# Patient Record
Sex: Female | Born: 1945 | Race: Black or African American | Hispanic: No | Smoking: Never smoker
Health system: Southern US, Community
[De-identification: ages and names within clinical notes are randomized; demographics above are authoritative.]

## PROBLEM LIST (undated history)

## (undated) DIAGNOSIS — M79605 Pain in left leg: Secondary | ICD-10-CM

## (undated) DIAGNOSIS — I429 Cardiomyopathy, unspecified: Secondary | ICD-10-CM

## (undated) DIAGNOSIS — I499 Cardiac arrhythmia, unspecified: Secondary | ICD-10-CM

## (undated) DIAGNOSIS — M1990 Unspecified osteoarthritis, unspecified site: Secondary | ICD-10-CM

## (undated) DIAGNOSIS — D649 Anemia, unspecified: Secondary | ICD-10-CM

## (undated) DIAGNOSIS — I509 Heart failure, unspecified: Secondary | ICD-10-CM

## (undated) DIAGNOSIS — J189 Pneumonia, unspecified organism: Secondary | ICD-10-CM

## (undated) DIAGNOSIS — G8929 Other chronic pain: Secondary | ICD-10-CM

## (undated) DIAGNOSIS — Z5189 Encounter for other specified aftercare: Secondary | ICD-10-CM

## (undated) DIAGNOSIS — K649 Unspecified hemorrhoids: Secondary | ICD-10-CM

## (undated) DIAGNOSIS — K219 Gastro-esophageal reflux disease without esophagitis: Secondary | ICD-10-CM

## (undated) DIAGNOSIS — I10 Essential (primary) hypertension: Secondary | ICD-10-CM

## (undated) DIAGNOSIS — G4730 Sleep apnea, unspecified: Secondary | ICD-10-CM

## (undated) DIAGNOSIS — C9000 Multiple myeloma not having achieved remission: Secondary | ICD-10-CM

## (undated) DIAGNOSIS — Z8719 Personal history of other diseases of the digestive system: Secondary | ICD-10-CM

## (undated) DIAGNOSIS — IMO0001 Reserved for inherently not codable concepts without codable children: Secondary | ICD-10-CM

## (undated) DIAGNOSIS — E663 Overweight: Secondary | ICD-10-CM

## (undated) DIAGNOSIS — M549 Dorsalgia, unspecified: Secondary | ICD-10-CM

## (undated) DIAGNOSIS — G629 Polyneuropathy, unspecified: Secondary | ICD-10-CM

## (undated) DIAGNOSIS — E041 Nontoxic single thyroid nodule: Secondary | ICD-10-CM

## (undated) HISTORY — PX: CARDIAC DEFIBRILLATOR PLACEMENT: SHX171

## (undated) HISTORY — PX: COLONOSCOPY: SHX174

## (undated) HISTORY — PX: OTHER SURGICAL HISTORY: SHX169

## (undated) HISTORY — DX: Overweight: E66.3

## (undated) HISTORY — DX: Essential (primary) hypertension: I10

## (undated) HISTORY — DX: Cardiomyopathy, unspecified: I42.9

---

## 1969-03-22 HISTORY — PX: TUBAL LIGATION: SHX77

## 1989-03-22 HISTORY — PX: APPENDECTOMY: SHX54

## 1989-03-22 HISTORY — PX: OTHER SURGICAL HISTORY: SHX169

## 1989-03-22 HISTORY — PX: ABDOMINAL HYSTERECTOMY: SHX81

## 1990-03-22 HISTORY — PX: CHOLECYSTECTOMY OPEN: SUR202

## 1998-01-16 ENCOUNTER — Ambulatory Visit (HOSPITAL_COMMUNITY): Admission: RE | Admit: 1998-01-16 | Discharge: 1998-01-16 | Payer: Self-pay | Admitting: Cardiology

## 1998-01-17 ENCOUNTER — Encounter: Payer: Self-pay | Admitting: Cardiology

## 1998-08-15 ENCOUNTER — Ambulatory Visit (HOSPITAL_COMMUNITY): Admission: RE | Admit: 1998-08-15 | Discharge: 1998-08-15 | Payer: Self-pay | Admitting: Surgery

## 1998-08-15 ENCOUNTER — Encounter: Payer: Self-pay | Admitting: Surgery

## 1999-07-09 ENCOUNTER — Encounter: Payer: Self-pay | Admitting: Cardiology

## 1999-07-09 ENCOUNTER — Ambulatory Visit (HOSPITAL_COMMUNITY): Admission: RE | Admit: 1999-07-09 | Discharge: 1999-07-09 | Payer: Self-pay | Admitting: Cardiology

## 1999-10-26 ENCOUNTER — Encounter: Payer: Self-pay | Admitting: Family Medicine

## 1999-10-26 ENCOUNTER — Ambulatory Visit (HOSPITAL_COMMUNITY): Admission: RE | Admit: 1999-10-26 | Discharge: 1999-10-26 | Payer: Self-pay | Admitting: Family Medicine

## 2000-12-04 ENCOUNTER — Encounter: Payer: Self-pay | Admitting: Internal Medicine

## 2000-12-04 ENCOUNTER — Emergency Department (HOSPITAL_COMMUNITY): Admission: EM | Admit: 2000-12-04 | Discharge: 2000-12-04 | Payer: Self-pay | Admitting: Internal Medicine

## 2001-08-17 ENCOUNTER — Ambulatory Visit (HOSPITAL_COMMUNITY): Admission: RE | Admit: 2001-08-17 | Discharge: 2001-08-17 | Payer: Self-pay | Admitting: Family Medicine

## 2001-08-17 ENCOUNTER — Encounter: Payer: Self-pay | Admitting: Family Medicine

## 2001-09-11 ENCOUNTER — Encounter: Payer: Self-pay | Admitting: Family Medicine

## 2001-09-11 ENCOUNTER — Ambulatory Visit (HOSPITAL_COMMUNITY): Admission: RE | Admit: 2001-09-11 | Discharge: 2001-09-11 | Payer: Self-pay | Admitting: Family Medicine

## 2001-09-28 ENCOUNTER — Ambulatory Visit (HOSPITAL_COMMUNITY): Admission: RE | Admit: 2001-09-28 | Discharge: 2001-09-28 | Payer: Self-pay | Admitting: Cardiovascular Disease

## 2001-12-22 ENCOUNTER — Observation Stay (HOSPITAL_COMMUNITY): Admission: RE | Admit: 2001-12-22 | Discharge: 2001-12-23 | Payer: Self-pay | Admitting: General Surgery

## 2002-01-09 ENCOUNTER — Encounter: Payer: Self-pay | Admitting: Family Medicine

## 2002-01-09 ENCOUNTER — Ambulatory Visit (HOSPITAL_COMMUNITY): Admission: RE | Admit: 2002-01-09 | Discharge: 2002-01-09 | Payer: Self-pay | Admitting: Family Medicine

## 2002-01-18 ENCOUNTER — Encounter: Payer: Self-pay | Admitting: Family Medicine

## 2002-01-18 ENCOUNTER — Ambulatory Visit (HOSPITAL_COMMUNITY): Admission: RE | Admit: 2002-01-18 | Discharge: 2002-01-18 | Payer: Self-pay | Admitting: Family Medicine

## 2002-01-22 ENCOUNTER — Inpatient Hospital Stay (HOSPITAL_COMMUNITY): Admission: EM | Admit: 2002-01-22 | Discharge: 2002-01-30 | Payer: Self-pay | Admitting: Emergency Medicine

## 2002-01-22 ENCOUNTER — Encounter: Payer: Self-pay | Admitting: Emergency Medicine

## 2002-01-24 ENCOUNTER — Encounter: Payer: Self-pay | Admitting: General Surgery

## 2002-01-25 ENCOUNTER — Encounter: Payer: Self-pay | Admitting: General Surgery

## 2002-01-30 ENCOUNTER — Encounter: Payer: Self-pay | Admitting: Family Medicine

## 2002-02-01 ENCOUNTER — Inpatient Hospital Stay (HOSPITAL_COMMUNITY): Admission: EM | Admit: 2002-02-01 | Discharge: 2002-02-09 | Payer: Self-pay | Admitting: Emergency Medicine

## 2002-02-01 ENCOUNTER — Encounter: Payer: Self-pay | Admitting: General Surgery

## 2002-02-01 ENCOUNTER — Encounter: Payer: Self-pay | Admitting: Emergency Medicine

## 2002-02-02 ENCOUNTER — Encounter: Payer: Self-pay | Admitting: General Surgery

## 2002-02-03 ENCOUNTER — Encounter: Payer: Self-pay | Admitting: General Surgery

## 2002-02-04 ENCOUNTER — Encounter: Payer: Self-pay | Admitting: General Surgery

## 2002-02-08 ENCOUNTER — Encounter: Payer: Self-pay | Admitting: Family Medicine

## 2002-04-05 ENCOUNTER — Encounter: Payer: Self-pay | Admitting: General Surgery

## 2002-04-05 ENCOUNTER — Ambulatory Visit (HOSPITAL_COMMUNITY): Admission: RE | Admit: 2002-04-05 | Discharge: 2002-04-05 | Payer: Self-pay | Admitting: General Surgery

## 2002-07-09 ENCOUNTER — Encounter (HOSPITAL_COMMUNITY): Admission: RE | Admit: 2002-07-09 | Discharge: 2002-08-08 | Payer: Self-pay | Admitting: Orthopedic Surgery

## 2003-04-11 ENCOUNTER — Ambulatory Visit (HOSPITAL_COMMUNITY): Admission: RE | Admit: 2003-04-11 | Discharge: 2003-04-11 | Payer: Self-pay | Admitting: General Surgery

## 2003-05-16 ENCOUNTER — Encounter (HOSPITAL_COMMUNITY): Admission: RE | Admit: 2003-05-16 | Discharge: 2003-05-17 | Payer: Self-pay | Admitting: *Deleted

## 2004-03-19 ENCOUNTER — Ambulatory Visit: Payer: Self-pay | Admitting: *Deleted

## 2004-08-20 ENCOUNTER — Ambulatory Visit (HOSPITAL_COMMUNITY): Admission: RE | Admit: 2004-08-20 | Discharge: 2004-08-20 | Payer: Self-pay | Admitting: Family Medicine

## 2004-12-25 ENCOUNTER — Ambulatory Visit: Payer: Self-pay | Admitting: *Deleted

## 2005-03-28 ENCOUNTER — Emergency Department (HOSPITAL_COMMUNITY): Admission: EM | Admit: 2005-03-28 | Discharge: 2005-03-28 | Payer: Self-pay | Admitting: Emergency Medicine

## 2005-03-31 ENCOUNTER — Ambulatory Visit (HOSPITAL_COMMUNITY): Admission: RE | Admit: 2005-03-31 | Discharge: 2005-03-31 | Payer: Self-pay | Admitting: *Deleted

## 2005-03-31 ENCOUNTER — Ambulatory Visit: Payer: Self-pay | Admitting: *Deleted

## 2005-04-12 ENCOUNTER — Ambulatory Visit: Payer: Self-pay | Admitting: *Deleted

## 2005-09-15 ENCOUNTER — Ambulatory Visit: Payer: Self-pay | Admitting: Internal Medicine

## 2005-10-06 ENCOUNTER — Inpatient Hospital Stay (HOSPITAL_COMMUNITY): Admission: RE | Admit: 2005-10-06 | Discharge: 2005-10-07 | Payer: Self-pay | Admitting: Internal Medicine

## 2005-10-06 ENCOUNTER — Ambulatory Visit: Payer: Self-pay | Admitting: Internal Medicine

## 2005-10-09 ENCOUNTER — Observation Stay (HOSPITAL_COMMUNITY): Admission: EM | Admit: 2005-10-09 | Discharge: 2005-10-10 | Payer: Self-pay | Admitting: Emergency Medicine

## 2005-10-09 ENCOUNTER — Ambulatory Visit: Payer: Self-pay | Admitting: Internal Medicine

## 2005-10-11 ENCOUNTER — Inpatient Hospital Stay (HOSPITAL_COMMUNITY): Admission: EM | Admit: 2005-10-11 | Discharge: 2005-10-19 | Payer: Self-pay | Admitting: Emergency Medicine

## 2005-10-11 ENCOUNTER — Ambulatory Visit: Payer: Self-pay | Admitting: Cardiology

## 2005-10-11 ENCOUNTER — Encounter: Payer: Self-pay | Admitting: Emergency Medicine

## 2005-10-11 ENCOUNTER — Encounter: Payer: Self-pay | Admitting: Internal Medicine

## 2005-10-14 ENCOUNTER — Encounter: Payer: Self-pay | Admitting: Cardiology

## 2005-10-27 ENCOUNTER — Encounter: Payer: Self-pay | Admitting: Cardiology

## 2005-10-27 ENCOUNTER — Ambulatory Visit: Payer: Self-pay

## 2005-10-27 ENCOUNTER — Ambulatory Visit: Payer: Self-pay | Admitting: Cardiology

## 2005-11-04 ENCOUNTER — Emergency Department (HOSPITAL_COMMUNITY): Admission: EM | Admit: 2005-11-04 | Discharge: 2005-11-04 | Payer: Self-pay | Admitting: Emergency Medicine

## 2005-11-11 ENCOUNTER — Ambulatory Visit: Payer: Self-pay | Admitting: Cardiology

## 2005-11-16 ENCOUNTER — Ambulatory Visit: Payer: Self-pay | Admitting: *Deleted

## 2005-11-17 ENCOUNTER — Emergency Department (HOSPITAL_COMMUNITY): Admission: EM | Admit: 2005-11-17 | Discharge: 2005-11-17 | Payer: Self-pay | Admitting: Emergency Medicine

## 2005-11-19 ENCOUNTER — Emergency Department (HOSPITAL_COMMUNITY): Admission: EM | Admit: 2005-11-19 | Discharge: 2005-11-19 | Payer: Self-pay | Admitting: Emergency Medicine

## 2006-01-12 ENCOUNTER — Ambulatory Visit: Payer: Self-pay | Admitting: Internal Medicine

## 2006-01-26 ENCOUNTER — Ambulatory Visit: Payer: Self-pay | Admitting: Cardiovascular Disease

## 2006-02-21 ENCOUNTER — Encounter: Payer: Self-pay | Admitting: Emergency Medicine

## 2006-02-21 ENCOUNTER — Inpatient Hospital Stay (HOSPITAL_COMMUNITY): Admission: AD | Admit: 2006-02-21 | Discharge: 2006-02-24 | Payer: Self-pay | Admitting: Cardiovascular Disease

## 2006-02-21 ENCOUNTER — Ambulatory Visit: Payer: Self-pay | Admitting: Internal Medicine

## 2006-04-15 ENCOUNTER — Ambulatory Visit: Payer: Self-pay | Admitting: Internal Medicine

## 2006-06-05 ENCOUNTER — Ambulatory Visit: Payer: Self-pay | Admitting: Cardiology

## 2006-06-05 ENCOUNTER — Inpatient Hospital Stay (HOSPITAL_COMMUNITY): Admission: AD | Admit: 2006-06-05 | Discharge: 2006-06-09 | Payer: Self-pay | Admitting: Cardiology

## 2006-06-07 ENCOUNTER — Encounter: Payer: Self-pay | Admitting: Internal Medicine

## 2006-06-07 ENCOUNTER — Encounter: Payer: Self-pay | Admitting: Cardiology

## 2006-06-23 ENCOUNTER — Ambulatory Visit: Payer: Self-pay | Admitting: Internal Medicine

## 2006-07-11 ENCOUNTER — Ambulatory Visit (HOSPITAL_COMMUNITY): Admission: RE | Admit: 2006-07-11 | Discharge: 2006-07-11 | Payer: Self-pay | Admitting: Family Medicine

## 2006-07-27 ENCOUNTER — Ambulatory Visit: Payer: Self-pay | Admitting: Internal Medicine

## 2006-08-23 ENCOUNTER — Encounter: Payer: Self-pay | Admitting: Internal Medicine

## 2006-08-23 ENCOUNTER — Emergency Department (HOSPITAL_COMMUNITY): Admission: EM | Admit: 2006-08-23 | Discharge: 2006-08-23 | Payer: Self-pay | Admitting: Emergency Medicine

## 2006-09-01 ENCOUNTER — Ambulatory Visit: Payer: Self-pay | Admitting: Internal Medicine

## 2006-09-24 ENCOUNTER — Emergency Department (HOSPITAL_COMMUNITY): Admission: EM | Admit: 2006-09-24 | Discharge: 2006-09-24 | Payer: Self-pay | Admitting: Emergency Medicine

## 2006-12-08 ENCOUNTER — Ambulatory Visit: Payer: Self-pay | Admitting: Internal Medicine

## 2007-04-08 ENCOUNTER — Emergency Department (HOSPITAL_COMMUNITY): Admission: EM | Admit: 2007-04-08 | Discharge: 2007-04-08 | Payer: Self-pay | Admitting: Emergency Medicine

## 2007-04-10 ENCOUNTER — Emergency Department (HOSPITAL_COMMUNITY): Admission: EM | Admit: 2007-04-10 | Discharge: 2007-04-10 | Payer: Self-pay | Admitting: Emergency Medicine

## 2007-05-05 ENCOUNTER — Ambulatory Visit (HOSPITAL_COMMUNITY): Admission: RE | Admit: 2007-05-05 | Discharge: 2007-05-05 | Payer: Self-pay | Admitting: Internal Medicine

## 2007-05-09 ENCOUNTER — Ambulatory Visit (HOSPITAL_COMMUNITY): Admission: RE | Admit: 2007-05-09 | Discharge: 2007-05-09 | Payer: Self-pay | Admitting: Family Medicine

## 2007-05-26 ENCOUNTER — Ambulatory Visit: Payer: Self-pay | Admitting: Internal Medicine

## 2007-08-29 ENCOUNTER — Other Ambulatory Visit: Admission: RE | Admit: 2007-08-29 | Discharge: 2007-08-29 | Payer: Self-pay | Admitting: Family Medicine

## 2007-11-14 ENCOUNTER — Ambulatory Visit (HOSPITAL_COMMUNITY): Admission: RE | Admit: 2007-11-14 | Discharge: 2007-11-14 | Payer: Self-pay | Admitting: Family Medicine

## 2007-12-03 ENCOUNTER — Ambulatory Visit: Payer: Self-pay | Admitting: Cardiology

## 2007-12-04 ENCOUNTER — Encounter (INDEPENDENT_AMBULATORY_CARE_PROVIDER_SITE_OTHER): Payer: Self-pay | Admitting: Emergency Medicine

## 2007-12-04 ENCOUNTER — Inpatient Hospital Stay (HOSPITAL_COMMUNITY): Admission: EM | Admit: 2007-12-04 | Discharge: 2007-12-05 | Payer: Self-pay | Admitting: Emergency Medicine

## 2007-12-20 ENCOUNTER — Ambulatory Visit: Payer: Self-pay | Admitting: Internal Medicine

## 2008-03-06 ENCOUNTER — Emergency Department (HOSPITAL_COMMUNITY): Admission: EM | Admit: 2008-03-06 | Discharge: 2008-03-06 | Payer: Self-pay | Admitting: Emergency Medicine

## 2008-09-04 ENCOUNTER — Encounter (INDEPENDENT_AMBULATORY_CARE_PROVIDER_SITE_OTHER): Payer: Self-pay | Admitting: *Deleted

## 2008-09-18 DIAGNOSIS — I5022 Chronic systolic (congestive) heart failure: Secondary | ICD-10-CM | POA: Insufficient documentation

## 2008-09-18 DIAGNOSIS — I10 Essential (primary) hypertension: Secondary | ICD-10-CM | POA: Insufficient documentation

## 2008-09-18 DIAGNOSIS — E119 Type 2 diabetes mellitus without complications: Secondary | ICD-10-CM | POA: Insufficient documentation

## 2008-09-18 DIAGNOSIS — I429 Cardiomyopathy, unspecified: Secondary | ICD-10-CM | POA: Insufficient documentation

## 2008-12-04 ENCOUNTER — Ambulatory Visit (HOSPITAL_COMMUNITY): Admission: RE | Admit: 2008-12-04 | Discharge: 2008-12-04 | Payer: Self-pay | Admitting: Family Medicine

## 2009-02-07 ENCOUNTER — Emergency Department (HOSPITAL_COMMUNITY): Admission: EM | Admit: 2009-02-07 | Discharge: 2009-02-07 | Payer: Self-pay | Admitting: Emergency Medicine

## 2009-08-11 ENCOUNTER — Ambulatory Visit: Payer: Self-pay | Admitting: Internal Medicine

## 2010-01-12 ENCOUNTER — Ambulatory Visit (HOSPITAL_COMMUNITY): Admission: RE | Admit: 2010-01-12 | Discharge: 2010-01-12 | Payer: Self-pay | Admitting: Family Medicine

## 2010-01-16 ENCOUNTER — Encounter (INDEPENDENT_AMBULATORY_CARE_PROVIDER_SITE_OTHER): Payer: Self-pay | Admitting: *Deleted

## 2010-01-28 ENCOUNTER — Encounter: Payer: Self-pay | Admitting: Gastroenterology

## 2010-02-03 ENCOUNTER — Encounter: Payer: Self-pay | Admitting: Gastroenterology

## 2010-02-11 ENCOUNTER — Encounter (INDEPENDENT_AMBULATORY_CARE_PROVIDER_SITE_OTHER): Payer: Self-pay

## 2010-02-11 ENCOUNTER — Telehealth (INDEPENDENT_AMBULATORY_CARE_PROVIDER_SITE_OTHER): Payer: Self-pay

## 2010-02-17 ENCOUNTER — Telehealth (INDEPENDENT_AMBULATORY_CARE_PROVIDER_SITE_OTHER): Payer: Self-pay

## 2010-02-20 ENCOUNTER — Ambulatory Visit (HOSPITAL_COMMUNITY)
Admission: RE | Admit: 2010-02-20 | Discharge: 2010-02-20 | Payer: Self-pay | Source: Home / Self Care | Admitting: Gastroenterology

## 2010-02-27 ENCOUNTER — Encounter (INDEPENDENT_AMBULATORY_CARE_PROVIDER_SITE_OTHER): Payer: Self-pay | Admitting: *Deleted

## 2010-03-24 ENCOUNTER — Encounter (HOSPITAL_COMMUNITY)
Admission: RE | Admit: 2010-03-24 | Discharge: 2010-04-21 | Payer: Self-pay | Source: Home / Self Care | Attending: Podiatry | Admitting: Podiatry

## 2010-04-12 ENCOUNTER — Encounter: Payer: Self-pay | Admitting: Internal Medicine

## 2010-04-12 ENCOUNTER — Encounter: Payer: Self-pay | Admitting: Family Medicine

## 2010-04-23 ENCOUNTER — Ambulatory Visit: Admit: 2010-04-23 | Payer: Self-pay | Admitting: Internal Medicine

## 2010-04-23 ENCOUNTER — Encounter: Payer: Self-pay | Admitting: Internal Medicine

## 2010-04-23 ENCOUNTER — Encounter (INDEPENDENT_AMBULATORY_CARE_PROVIDER_SITE_OTHER): Payer: Medicare Other | Admitting: Internal Medicine

## 2010-04-23 DIAGNOSIS — I10 Essential (primary) hypertension: Secondary | ICD-10-CM

## 2010-04-23 DIAGNOSIS — R002 Palpitations: Secondary | ICD-10-CM

## 2010-04-23 DIAGNOSIS — I428 Other cardiomyopathies: Secondary | ICD-10-CM

## 2010-04-23 NOTE — Letter (Signed)
Summary: TCS ORDER/TRIAGE  TCS ORDER/TRIAGE   Imported By: Hoy Morn 01/28/2010 10:23:45  _____________________________________________________________________  External Attachment:    Type:   Image     Comment:   External Document

## 2010-04-23 NOTE — Miscellaneous (Signed)
Summary: dx correction  Clinical Lists Changes  Problems: Changed problem from PACEMAKER (ICD-V45.[name]01) to PACEMAKER, PERMANENT (ICD-V45.01) changed the incorrect dx code to correct dx code [name]  January 16, 2010 1:38 PM

## 2010-04-23 NOTE — Letter (Signed)
Summary: INSTRUCTIONS FOR TCS  INSTRUCTIONS FOR TCS   Imported By: Hoy Morn 02/03/2010 15:59:26  _____________________________________________________________________  External Attachment:    Type:   Image     Comment:   External Document

## 2010-04-23 NOTE — Letter (Signed)
Summary: Appointment - Reminder 2  [facility]. [address]   [place], [place] [address]   Phone: [phone]  Fax: [phone]     February 27, 2010 MRN: [id number]   TYREESE KNIEP [place], [place]  [address]   Dear Ms. Petrosyan,  Our records indicate that it is time to schedule a follow-up appointment.  Dr.  [name]        recommended that you follow up with [place] in   11.2011 PAST DUE         . It is very important that we reach you to schedule this appointment. We look forward to participating in your health care needs. Please contact us at the number listed above at your earliest convenience to schedule your appointment.  If you are unable to make an appointment at this time, give [place] a call so we can update our records.     Sincerely,   Public relations account executive

## 2010-04-23 NOTE — Progress Notes (Signed)
Summary: phone note/ need to update meds  Phone Note Outgoing Call   Call placed by: DORIS Call placed to: Patient Summary of Call: I called pt to review meds, etc before procedure. Number [phone] said not taking calls. [phone]  many rings and now answer. Will mail letter to call also, to review meds prior to procedure. Initial call taken by: Waldon Merl LPN,  November 23, 624THL 2:27 PM

## 2010-04-23 NOTE — Progress Notes (Signed)
Summary: phone note/ updated meds, etc  Phone Note Call from Patient   Caller: Patient Summary of Call: Pt called for me to review all of her meds. All updated. No changes. 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 I told her i will let Dr. Oneida Alar know, and if there is a problem i will call her back. Otherwise, plan on her procedure as scheduled.  Initial call taken by: Waldon Merl LPN,  November 29, 624THL 11:01 AM     Appended Document: phone note/ updated meds, etc ok.

## 2010-04-23 NOTE — Letter (Signed)
Summary: Plan of Care, Need to [facility]  [address]   [place], [place] [address]   Phone: [phone]  Fax: [phone]    February 11, 2010  [name] [address] [place], [place]  [address] 1946/01/27   Dear Ms. Morrill,  I have tried unsuccessfully to reach you by phone. Please call the office and ask to speak with me. I need to review your medications, etc prior to your procedure on 02/20/2010.  Please do not neglect your health.   Sincerely,    Waldon Merl LPN  [facility] Ph: [phone]    Fax: [phone]

## 2010-04-23 NOTE — Assessment & Plan Note (Signed)
Summary: F2Y   Visit Type:  Follow-up Primary Provider:  Dr.Gerald Hill  CC:  no cardiology complaints today.  History of Present Illness: Ms. Sassone returns today for followup of CHF and HTN.  She is a pleasant middle aged woman with a h/o DCM and HTN and morbid obesity.  She had recurrent ICD lead dislodgements and ultimately underwent removal of her ICD.  He EF had improved and her CHF symptoms have remained class 2.  He denies c/p, sob or peripheral edema except that she does get sob when she walks her dog.  No syncope or near syncope.  Current Medications (verified): 1)  Carvedilol 25 Mg Tabs (Carvedilol) .... Take 1 Tab Two Times A Day 2)  Aspir-Low 81 Mg Tbec (Aspirin) .... Take 1 Tab Daily 3)  Vitamin D3 1000 Unit Tabs (Cholecalciferol) .... Take 1 Tab Daily 4)  Metformin Hcl 1000 Mg Tabs (Metformin Hcl) .... Take 1 Tab Two Times A Day 5)  Aldactone 25 Mg Tabs (Spironolactone) .... Take 1 Tab Daily 6)  Glipizide 5 Mg Tabs (Glipizide) .... Take 1 Tab Daily 7)  Furosemide 40 Mg Tabs (Furosemide) .... Take 1 Tab Daily 8)  Diovan 160 Mg Tabs (Valsartan) .... Take 1 Tab Daily 9)  Lanoxin 0.125 Mg Tabs (Digoxin) .... Take 1 Tab Daily 10)  Daily Vitamins  Tabs (Multiple Vitamin) .... Take 1 Tab Daily  Allergies (verified): No Known Drug Allergies  Past History:  Past Medical History: Last updated: 09/18/2008 PACEMAKER (ICD-V45.[name]01) SYSTOLIC HEART FAILURE, CHRONIC (ICD-428.22) DIABETES MELLITUS (ICD-250.00) HYPERTENSION, UNSPECIFIED (ICD-401.9) OVERWEIGHT/OBESITY (ICD-278.02) CARDIOMYOPATHY, SECONDARY (ICD-425.9)    Past Surgical History: Last updated: 09/18/2008 ICD - St Jude Atlas   Review of Systems  The patient denies chest pain, syncope, dyspnea on exertion, and peripheral edema.    Vital Signs:  Patient profile:   65 year old female Height:      65 inches Weight:      272 pounds BMI:     45.43 Pulse rate:   70 / minute BP sitting:   112 / 65  (right  arm)  Vitals Entered By: Doretha Sou, CNA (Aug 11, 2009 10:30 AM)  Physical Exam  General:  Morbidly obese, well developed, well nourished, in no acute distress.  HEENT: normal Neck: supple. No JVD. Carotids 2+ bilaterally no bruits Cor: RRR no rubs, gallops or murmur Lungs: CTA. Healed old ICD incisions. Ab: soft, nontender. nondistended. No HSM. Good bowel sounds Ext: warm. no cyanosis, clubbing or edema Neuro: alert and oriented. Grossly nonfocal. affect pleasant    Impression & Recommendations:  Problem # 1:  SYSTOLIC HEART FAILURE, CHRONIC (ICD-428.22) Her symptoms remain well controlled.  I have asked that she continue her current meds, maintain a low sodium diet and continue her daily exercise. Her updated medication list for this problem includes:    Carvedilol 25 Mg Tabs (Carvedilol) .[name]... Take 1 tab two times a day    Aspir-low 81 Mg Tbec (Aspirin) .[name]... Take 1 tab daily    Aldactone 25 Mg Tabs (Spironolactone) .[name]... Take 1 tab daily    Furosemide 40 Mg Tabs (Furosemide) .[name]... Take 1 tab daily    Diovan 160 Mg Tabs (Valsartan) .[name]... Take 1 tab daily    Lanoxin 0.125 Mg Tabs (Digoxin) .[name]... Take 1 tab daily  Her updated medication list for this problem includes:    Carvedilol 25 Mg Tabs (Carvedilol) .[name]... Take 1 tab two times a day    Aspir-low 81 Mg Tbec (Aspirin) .Marland KitchenMarland KitchenMarland KitchenMarland Kitchen  Take 1 tab daily    Aldactone 25 Mg Tabs (Spironolactone) .[name]... Take 1 tab daily    Furosemide 40 Mg Tabs (Furosemide) .[name]... Take 1 tab daily    Diovan 160 Mg Tabs (Valsartan) .[name]... Take 1 tab daily    Lanoxin 0.125 Mg Tabs (Digoxin) .[name]... Take 1 tab daily  Problem # 2:  HYPERTENSION, UNSPECIFIED (ICD-401.9) Her blood pressure has for the most part remained well controlled.  Continue a low sodium diet and regular exercise. Her updated medication list for this problem includes:    Carvedilol 25 Mg Tabs (Carvedilol) .[name]... Take 1 tab two times a day    Aspir-low 81 Mg Tbec (Aspirin) .[name]... Take 1 tab  daily    Aldactone 25 Mg Tabs (Spironolactone) .[name]... Take 1 tab daily    Furosemide 40 Mg Tabs (Furosemide) .[name]... Take 1 tab daily    Diovan 160 Mg Tabs (Valsartan) .[name]... Take 1 tab daily  Problem # 3:  OVERWEIGHT/OBESITY (ICD-278.02) I have continue to encourage her on regular exercise and weight loss.  Patient Instructions: 1)  Your physician recommends that you schedule a follow-up appointment in: 6 months 2)  Your physician recommends that you continue on your current medications as directed. Please refer to the Current Medication list given to you today. Prescriptions: CARVEDILOL 25 MG TABS (CARVEDILOL) take 1 tab two times a day  #60 x 3   Entered by:   Jeani Hawking Via LPN   Authorized by:   Sophronia Simas, MD, [facility]   Signed by:   Jeani Hawking Via LPN on [id number]   Method used:   Electronically to        [facility]. [phone]* (retail)       [address]       [place], [place]  [address]       Ph: [phone] or [phone]       Fax: [phone]   RxIDTO:[id number]  Broadus John, pharmacist at [facility] was advised by phone that pt. can have 11 refills as pt. will not f/u for 1 year.

## 2010-04-29 NOTE — Assessment & Plan Note (Signed)
Summary: f18m/h   Vital Signs:  Patient profile:   65 year old female Weight:      272 pounds BMI:     45.43 Pulse rate:   71 / minute BP sitting:   109 / 61  (left arm)  Vitals Entered By: Doretha Sou, CNA (April 23, 2010 10:03 AM)  Primary Provider:  Dr.Gerald Hill   History of Present Illness: [name] returns today for followup of CHF and HTN.  She is a pleasant middle aged woman with a h/o DCM and HTN and morbid obesity.  She had recurrent ICD lead dislodgements and ultimately underwent removal of her ICD.  Her EF had improved and her CHF symptoms have remained class 2.  He denies c/p, sob or peripheral edema except that she does get sob when she walks her dog.  No syncope or near syncope.  She notes rare episodes of palpitations. No syncope or near syncope.  Current Medications (verified): 1)  Carvedilol 25 Mg Tabs (Carvedilol) .... Take 1 Tab Two Times A Day 2)  Aspir-Low 81 Mg Tbec (Aspirin) .... Take 1 Tab Daily 3)  Vitamin D3 1000 Unit Tabs (Cholecalciferol) .... Take 1 Tab Daily 4)  Metformin Hcl 1000 Mg Tabs (Metformin Hcl) .... Take 1 Tab Two Times A Day 5)  Aldactone 25 Mg Tabs (Spironolactone) .... Take 1 Tab Daily 6)  Glipizide 5 Mg Tabs (Glipizide) .... Take 1 Tab Daily 7)  Furosemide 40 Mg Tabs (Furosemide) .... Take 1 Tab Daily 8)  Diovan 160 Mg Tabs (Valsartan) .... Take 1 Tab Daily 9)  Lanoxin 0.125 Mg Tabs (Digoxin) .... Take 1 Tab Daily 10)  Ferrous Fumarate 325 (106 Fe) Mg Tabs (Ferrous Fumarate) .... Take 1 Tab Two Times A Day 11)  Fish Oil 1000 Mg Caps (Omega-3 Fatty Acids) .... Take 1 Tab Three Times A Day 12)  Metanx 3-35-2 Mg Tabs (L-Methylfolate-B6-B12) .... Take 1  Tab Daily  Allergies (verified): No Known Drug Allergies  Past History:  Past Medical History: Last updated: 09/18/2008 PACEMAKER (ICD-V45.[name]01) SYSTOLIC HEART FAILURE, CHRONIC (ICD-428.22) DIABETES MELLITUS (ICD-250.00) HYPERTENSION, UNSPECIFIED  (ICD-401.9) OVERWEIGHT/OBESITY (ICD-278.02) CARDIOMYOPATHY, SECONDARY (ICD-425.9)    Past Surgical History: Last updated: 09/18/2008 ICD - St Jude Atlas   Review of Systems  The patient denies chest pain, syncope, dyspnea on exertion, and peripheral edema.    Physical Exam  General:  Morbidly obese, well developed, well nourished, in no acute distress.  HEENT: normal Neck: supple. No JVD. Carotids 2+ bilaterally no bruits Cor: RRR no rubs, gallops or murmur Lungs: CTA. Healed old ICD incisions. Ab: soft, nontender. nondistended. No HSM. Good bowel sounds Ext: warm. no cyanosis, clubbing or edema Neuro: alert and oriented. Grossly nonfocal. affect pleasant    Impression & Recommendations:  Problem # 1:  SYSTOLIC HEART FAILURE, CHRONIC (ICD-428.22) Her symptoms are stable. No evidence that her EF is worsened. she will continue her current meds and maintain a low sodium diet. Her updated medication list for this problem includes:    Carvedilol 25 Mg Tabs (Carvedilol) .[name]... Take 1 tab two times a day    Aspir-low 81 Mg Tbec (Aspirin) .[name]... Take 1 tab daily    Aldactone 25 Mg Tabs (Spironolactone) .[name]... Take 1 tab daily    Furosemide 40 Mg Tabs (Furosemide) .[name]... Take 1 tab daily    Diovan 160 Mg Tabs (Valsartan) .[name]... Take 1 tab daily    Lanoxin 0.125 Mg Tabs (Digoxin) .[name]... Take 1 tab daily  Problem # 2:  OVERWEIGHT/OBESITY (ICD-278.02) I have encouraged her to continue her regular exercise and reduce her oral intake.  Problem # 3:  PALPITATIONS, OCCASIONAL (P7382067.1) Her palpitations are new. I suspect PAC's or PVC's. If they increase in frequency or severity or result in syncope, then she is instructed to call us. Her updated medication list for this problem includes:    Carvedilol 25 Mg Tabs (Carvedilol) .[name]... Take 1 tab two times a day    Aspir-low 81 Mg Tbec (Aspirin) .[name]... Take 1 tab daily  Patient Instructions: 1)  Your physician recommends that you schedule a  follow-up appointment in: 6 months 2)  Your physician recommends that you continue on your current medications as directed. Please refer to the Current Medication list given to you today.

## 2010-06-01 LAB — GLUCOSE, CAPILLARY: Glucose-Capillary: 124 mg/dL — ABNORMAL HIGH (ref 70–99)

## 2010-06-02 ENCOUNTER — Encounter: Payer: Self-pay | Admitting: Adult Health

## 2010-06-02 ENCOUNTER — Ambulatory Visit (INDEPENDENT_AMBULATORY_CARE_PROVIDER_SITE_OTHER): Payer: Medicare Other | Admitting: Adult Health

## 2010-06-02 DIAGNOSIS — K219 Gastro-esophageal reflux disease without esophagitis: Secondary | ICD-10-CM

## 2010-06-02 DIAGNOSIS — I5022 Chronic systolic (congestive) heart failure: Secondary | ICD-10-CM

## 2010-06-02 DIAGNOSIS — K3184 Gastroparesis: Secondary | ICD-10-CM | POA: Insufficient documentation

## 2010-06-02 DIAGNOSIS — R079 Chest pain, unspecified: Secondary | ICD-10-CM

## 2010-06-09 NOTE — Assessment & Plan Note (Signed)
Summary: pt having problems w/chest tightness/tg   Visit Type:  Follow-up Primary Provider:  Dr.Gerald Hill   History of Present Illness: [name] is a 65 y/o morbidly obese AAF we are following for continued assessment and treatment of Diastolic CHF, HTN.  She has had recurrent ICD lead dislodgements an ultimatley underwent removal of her ICD by Dr. [name] after lead dislodgement and microperforation leading to pericoardial tamponade and need for perocardiocentesis.  She comes today with complaints of burping, gas-like pain on the right side of chest, fullness in her stomach causing her not to be hungry.  She also has right sided scapular pain worse with movement, but is reproducible with palpation.  She states that it began 6 days ago after eating some nuts. She states that she usually does not eat nuts because they do not agree with her stomach, but had some in a piece of candy she ate 6 days ago.  The musculoskeletal pain appears to be improving but she still feels a lot of gas.  She had been on protonix in the past but she stopped taking it.  She has not been seen by a GI specialist.  Current Medications (verified): 1)  Carvedilol 25 Mg Tabs (Carvedilol) .... Take 1 Tab Two Times A Day 2)  Aspir-Low 81 Mg Tbec (Aspirin) .... Take 1 Tab Daily 3)  Vitamin D3 1000 Unit Tabs (Cholecalciferol) .... Take 1 Tab Daily 4)  Metformin Hcl 1000 Mg Tabs (Metformin Hcl) .... Take 1 Tab Two Times A Day 5)  Aldactone 25 Mg Tabs (Spironolactone) .... Take 1 Tab Daily 6)  Glipizide 5 Mg Tabs (Glipizide) .... Take 1 Tab Daily 7)  Furosemide 40 Mg Tabs (Furosemide) .... Take 1 Tab Daily 8)  Diovan 160 Mg Tabs (Valsartan) .... Take 1 Tab Daily 9)  Lanoxin 0.125 Mg Tabs (Digoxin) .... Take 1 Tab Daily 10)  Ferrous Fumarate 325 (106 Fe) Mg Tabs (Ferrous Fumarate) .... Take 1 Tab Two Times A Day 11)  Fish Oil 1000 Mg Caps (Omega-3 Fatty Acids) .... Take 1 Tab Three Times A Day 12)  Metanx 3-35-2 Mg Tabs  (L-Methylfolate-B6-B12) .... Take 1  Tab Daily 13)  Protonix 40 Mg Tbec (Pantoprazole Sodium) .... Take 1 Tablet By Mouth Once Daily  Allergies (verified): No Known Drug Allergies  Comments:  Nurse/Medical Assistant: patient states all meds are the same [facility] [place]  Past History:  Past medical, surgical, family and social histories (including risk factors) reviewed, and no changes noted (except as noted below).  Past Medical History: Reviewed history from 09/18/2008 and no changes required. PACEMAKER (ICD-V45.[name]01) SYSTOLIC HEART FAILURE, CHRONIC (ICD-428.22) DIABETES MELLITUS (ICD-250.00) HYPERTENSION, UNSPECIFIED (ICD-401.9) OVERWEIGHT/OBESITY (ICD-278.02) CARDIOMYOPATHY, SECONDARY (ICD-425.9)    Past Surgical History: Reviewed history from 09/18/2008 and no changes required. ICD - St Jude Atlas   Family History: Reviewed history and no changes required.  Social History: Reviewed history from 09/18/2008 and no changes required. Widowed  Tobacco Use - No.  Alcohol Use - no  Review of Systems       Denies DOE or edema  All other systems have been reviewed and are negative unless stated above.   Vital Signs:  Patient profile:   65 year old female Weight:      273 pounds BMI:     45.59 Pulse rate:   70 / minute BP sitting:   103 / 57  (left arm)  Vitals Entered By: Doretha Sou, CNA (June 02, 2010  11:41 AM)  Physical Exam  General:  Well developed, well nourished, in no acute distress.obese.   Neck:  Large soft goiter noted Lungs:  Clear bilaterally to auscultation and percussion. Heart:  Non-displaced PMI, chest non-tender; regular rate and rhythm, S1, S2 without murmurs, rubs or gallops. Carotid upstroke normal, no bruit. Normal abdominal aortic size, no bruits. Femorals normal pulses, no bruits. Pedals normal pulses. No edema, no varicosities.Distant heart sounds. Abdomen:  Obese, mild tenderness in the  RUQ. Scar noted  from prior cholecystectomy.  Normal bowel sounds. Msk:  Back normal, normal gait. Muscle strength and tone normal. Pulses:  pulses normal in all 4 extremities Extremities:  No clubbing or cyanosis. Neurologic:  Alert and oriented x 3. Psych:  Normal affect.   Impression & Recommendations:  Problem # 1:  GASTROPARESIS (ICD-536.3) Cannot rule this out.  Also may be GERD symptoms but she is also having gas pain.  I have prescribed protonix 40mg  daily.  She is to see primary care and be referred to GI specialist if he feels this would be helpful.  Problem # 2:  SYSTOLIC HEART FAILURE, CHRONIC (ICD-428.22) She does not appear to be fluid overloaded. There is no clinical evidence of edema, diminished lung sounds or wheezing.  She will continue current medications as directed.  She will follow Dr. [name] in 6 months as previously scheduled. Her updated medication list for this problem includes:    Carvedilol 25 Mg Tabs (Carvedilol) .[name]... Take 1 tab two times a day    Aspir-low 81 Mg Tbec (Aspirin) .[name]... Take 1 tab daily    Aldactone 25 Mg Tabs (Spironolactone) .[name]... Take 1 tab daily    Furosemide 40 Mg Tabs (Furosemide) .[name]... Take 1 tab daily    Diovan 160 Mg Tabs (Valsartan) .[name]... Take 1 tab daily    Lanoxin 0.125 Mg Tabs (Digoxin) .[name]... Take 1 tab daily  Patient Instructions: 1)  Your physician recommends that you schedule a follow-up appointment in: as previously planned 2)  Your physician has recommended you make the following change in your medication: start taking Protonix 40mg  by mouth once daily  Prescriptions: PROTONIX 40 MG TBEC (PANTOPRAZOLE SODIUM) take 1 tablet by mouth once daily  #30 x 6   Entered by:   Jeani Hawking Via LPN   Authorized by:   Jory Sims, NP   Signed by:   Jeani Hawking Via LPN on [id number]   Method used:   Electronically to        [facility]. [phone]* (retail)       [address]       [place], [place]  [address]       Ph: [phone]       Fax:  [phone]   RxID:   [id number]

## 2010-06-24 LAB — BASIC METABOLIC PANEL
BUN: 20 mg/dL (ref 6–23)
Calcium: 9.7 mg/dL (ref 8.4–10.5)
GFR calc non Af Amer: 51 mL/min — ABNORMAL LOW (ref >60)
Glucose, Bld: 136 mg/dL — ABNORMAL HIGH (ref 70–99)
Sodium: 138 mEq/L (ref 135–145)

## 2010-06-24 LAB — URINALYSIS, ROUTINE W REFLEX MICROSCOPIC
Bilirubin Urine: NEGATIVE
Ketones, ur: NEGATIVE mg/dL
Nitrite: NEGATIVE
Urobilinogen, UA: 0.2 mg/dL (ref 0.0–1.0)

## 2010-06-24 LAB — POCT CARDIAC MARKERS
CKMB, poc: 1.2 ng/mL (ref 1.0–8.0)
CKMB, poc: 1.4 ng/mL (ref 1.0–8.0)
Myoglobin, poc: 104 ng/mL (ref 12–200)
Troponin i, poc: <0.05 ng/mL (ref 0.00–0.09)

## 2010-06-24 LAB — DIFFERENTIAL
Basophils Absolute: 0 10*3/uL (ref 0.0–0.1)
Lymphocytes Relative: 23 % (ref 12–46)
Neutro Abs: 3.1 10*3/uL (ref 1.7–7.7)

## 2010-06-24 LAB — CBC
Platelets: 170 10*3/uL (ref 150–400)
RDW: 17.5 % — ABNORMAL HIGH (ref 11.5–15.5)

## 2010-08-04 NOTE — Assessment & Plan Note (Signed)
[name]                                 ON-CALL NOTE   [name], [name]                    MRN:          [id number]  DATE:09/24/2006                            DOB:          1945/10/06    DATE OF TELEPHONE CONVERSATION:  September 24, 2006, at 8:57   ATTENDING PHYSICIAN:  Dr. Dorris Carnes   I received a page through the answering service from [name]  complaining of chest discomfort.  She stated her blood pressure was up  and she felt very anxious.  She states she had taken her medications but  she had been outside in the heat some the previous day and was feeling  more fatigued than usual, and felt like her heart was skipping beats.  I  instructed her to come to the emergency room to get further evaluation  secondary to known history of heart failure and previous ICD.  Ms.  Waln stated that she would.      Rosanne Sack, ACNP  Electronically Signed      Fay Records, MD, [facility]  Electronically Signed   MB/MedQ  DD: 09/26/2006  DT: 09/26/2006  Job #: [id number]

## 2010-08-04 NOTE — Discharge Summary (Signed)
NAMEKELCEY, FREIBERG NO.:  [id number]   MEDICAL RECORD NO.:  [id number]          PATIENT TYPE:  INP   LOCATION:  [id number]                         FACILITY:  [facility]   PHYSICIAN:  Jacquelynn Cree, M.D.   DATE OF BIRTH:  1946-02-14   DATE OF ADMISSION:  12/03/2007  DATE OF DISCHARGE:  12/05/2062                               DISCHARGE SUMMARY   PRIMARY CARE PHYSICIAN:  Barrie Folk. Berdine Addison, MD   CARDIOLOGIST:  Champ Mungo. [name], MD   DISCHARGE DIAGNOSES:  1. Presyncope secondary to suspected orthostasis and dehydration in      the setting of chronic anemia.  2. Chronic systolic congestive heart failure, ejection fraction 30-      40%, compensated.  3. Diabetes mellitus.  4. Hypertension.  5. Stage II chronic take kidney disease.  6. Morbid obesity.  7. Microcytic anemia, fecal occult blood testing negative x1,      outpatient gastrointestinal evaluation recommended.  8. Cerebrovascular disease.  9. Hypoalbuminemia.   DISCHARGE MEDICATIONS:  1. Digoxin 250 mcg daily.  2. Coreg 25 mg b.i.d.  3. Glipizide ER 10 mg daily.  4. Lasix 40 mg daily.  5. Diovan 40 mg daily.  6. Iron 325 mg daily.  7. Multivitamin daily.  8. Aspirin 81 mg daily.  9. Metformin 1000 mg b.i.d.   CONSULTATIONS:  None.   BRIEF ADMISSION HISTORY OF PRESENT ILLNESS:  The patient is a 65-year-  old female who presented to the hospital with a chief complaint of near  syncope and dizziness.  She apparently felt lightheaded with position  changes.  She did not pass out or lose consciousness.  For the full  details, please see the dictated report done by Dr. Jacki Cones.   PROCEDURES AND DIAGNOSTIC STUDIES:  1. CT scan of the head on December 03, 2007, showed no acute      intracranial abnormalities.  Chronic small vessel ischemic disease      and brain atrophy was noted.  2. Chest x-ray on December 03, 2007, showed cardiomegaly and      pulmonary venous hypertension.  Mild left lateral basilar  scarring,      stable.  3. MRI/MRA of the brain on December 04, 2007, showed mild chronic      small vessel disease with no sign of acute or reversible pathology.      The patient did have some anterior circulation stenosis in the      medium-sized vessels.  These were notably affecting both proximal      and anterior cerebral arteries as well as the right middle cerebral      artery.  Stenosis did not appear to be severe, but could present      some increased potential for infarction in these vascular      territories.  4. Two-dimensional echocardiogram on December 04, 2007, showed      moderately decreased left ventricular systolic function with      ejection fraction estimated at 30-40%.  Study was inadequate for      the evaluation of left ventricular regional wall motion.  Left  ventricular wall thickness is mildly increased.  There is a      possible small pericardial effusion posterior to the heart.   DISCHARGE LABORATORY VALUES:  Sodium was 142, potassium 4.2, chloride  106, bicarb 28, BUN 18, creatinine 0.95, 9.7, hematocrit 30.1, and  platelets 162.   HOSPITAL COURSE BY PROBLEM:  1. Presyncope:  The patient's initial BUN-to-creatinine ratio was      elevated raising the suspicion of dehydration and orthostasis as a      possible explanation of her symptoms.  Specifically, her BUN was 24      and her creatinine 1.3.  With gentle fluid rehydration, her BUN-to-      creatinine ratio improved and her discharge BUN and creatinine are      noted above.  Her symptoms did not reoccur during the course of her      hospital stay.  24 hours after admission, she had no evidence of      ongoing orthostatic blood pressure drops.  Felt that her presyncope      was likely multifactorial reflecting orthostasis and dehydration in      the setting of chronic anemia.  The patient has been instructed      that if her symptoms return, she should call the primary care      physician or  return to the hospital for further evaluation.  2. Chronic systolic congestive heart failure.  The patient did have a      two-dimensional echocardiogram done here on the hospital, which is      not significantly changed from prior exams.  Her cardiac markers      were negative.  She did not have any decompensated heart failure      during her hospital stay.  3. Diabetes:  The patient's diabetes is well controlled with      hemoglobin A1c is 6.1% on her outpatient regimen.  4. Hypertension:  The patient's blood pressure is well controlled      throughout her hospital stay.  5. Stage II chronic kidney disease:  The patient's current creatinine      clearance is consistent with stage II chronic kidney disease.  She      should be monitored closely in the outpatient setting by her      primary care physician.  6. Morbid obesity:  The patient did seen by our dieticians here who      did extensive education with regard to weight management and      diabetes management.  She appeared motivated for ongoing weight      loss efforts.  7. Microcytic anemia:  The patient did have a hemoglobin of 9.3-10.2.      Iron was low at 32 with a total iron-binding capacity normal at      284.  Ferritin was 66.  Fecal occult blood testing was negative x1.      She is encouraged to take her iron supplement daily (the patient      indicates that she takes this only sporadically).  Her primary care      physician should consider an outpatient GI evaluation.  8. Cerebrovascular disease:  The patient does have some stenoses seen      on MRA.  These findings were discussed with the patient and the      patient was advised that it should be very important to control all      of her cerebrovascular risk factors including her hypertension,  diabetes, and obesity.  She was also instructed to take her aspirin      daily.  9. Hypoalbuminemia:  Again, the patient did have a dietician      evaluation with appropriate  dietary recommendations.   DISPOSITION:  The patient is medically stable for discharge.   DISPOSITION:  The emergency department for any nonresolution of  symptoms.      Jacquelynn Cree, M.D.  Electronically Signed     CR/MEDQ  D:  12/05/2007  T:  12/05/2007  Job:  [id number]   cc:   Barrie Folk. Berdine Addison, MD  Champ Mungo. [name], MD

## 2010-08-04 NOTE — H&P (Signed)
[name], [name] NO.:  [id number]   MEDICAL RECORD NO.:  [id number]          PATIENT TYPE:  EMS   LOCATION:  MAJO                         FACILITY:  [facility]   PHYSICIAN:  Sharlet Salina, M.D.   DATE OF BIRTH:  1945-07-10   DATE OF ADMISSION:  12/03/2007  DATE OF DISCHARGE:                              HISTORY & PHYSICAL   CHIEF COMPLAINT:  Is now almost passed out today with some  lightheadedness.   HISTORY OF PRESENT ILLNESS:  The patient is a 65 year old African  American female past medical history significant for multiple medical  problems including diabetes, hypertension, congestive heart failure.  The patient stated that today she was at home.  She stood up to get  something out of the cupboard.  When she stood up she felt very  lightheaded.  She did not get any vertigo and did not have any chest  pain or shortness of breath.  She sat back down then she got up to go to  the bathroom.  A half an hour later and she had the same feeling.  So,  she came to the emergency room.  She did not pass out, did not lose  consciousness.  She stated that this happened a few years ago but at  that time she was outside walking in the heat.  No other complaints  today.   PAST MEDICAL HISTORY:  Significant for:  1. Diabetes type 2.  2. Hypertension.  3. Chronic renal insufficiency.  4. Tricuspid regurg.  5. CHF with ejection fraction of 25-30%.  6. She is status post defibrillator placed in July 2007 that was      complicated by microperforation and pericardial effusion with      tamponade requiring pericardiocentesis.  7. Morbid obesity.  8. Total abdominal hysterectomy bilateral salpingo-oophorectomy.  9. Status post appendectomy.  10.Status post cholecystectomy.  11.Status post stomach stapling in the past.  12.Microcytic anemia.   FAMILY HISTORY:  Father's family history is unknown.  Mother is  deceased, she had a kidney disease.   SOCIAL HISTORY:  The  patient lives in [place].  She is widowed.  She  has 2 children.  No tobacco or alcohol use.   MEDICATIONS:  1. Digoxin 250 mcg daily.  2. Coreg 25 mg daily.  3. Glipizide ER 10 mg daily.  4. Lasix 40 mg daily.  5. Dilantin 80 mg half daily.  6. Multivitamin daily.  7. Iron 270 mg 2 daily.  8. Aspirin 81 mg daily.  9. Metformin 1000 mg twice daily.  10.Spironolactone 25 mg daily.   ALLERGIES:  No known drug allergies.   REVIEW OF SYSTEMS:  Negative otherwise stated in HPI.   PHYSICAL EXAM:  Temperature 98.8.  Blood pressure 101/60.  Pulse of 90.  Respirations 18.  Pulse ox 97% on room air.  HEENT:  Head normocephalic, atraumatic.  Pupils equal, round, reactive  to light but without erythema.  CARDIOVASCULAR:  Regular rate and rhythm  with S1, S2 systolic murmur.  LUNGS:  Clear bilaterally.  No wheeze, rhonchi or rales.  ABDOMEN:  Obese.  Soft, nontender, nondistended.  Positive bowel sounds.  EXTREMITIES:  Without edema.   LABS:  WBC 4.8, hemoglobin 10.2, MCV of 76.5, platelet 173.  Sodium 138,  potassium 4.34, chloride 106, BUN 24, creatinine 1.3, glucose 112,  hemoglobin 10.2, hematocrit 30.  Cardiac markers:  Myoglobin 86.3, MB  less than 1, troponin less than 0.05.  T waves 0-2.  WBCs:  Small  leukocyte esterase.  Head CT showed no acute intracranial abnormality,  chronic small vessel ischemic changes.  Chest x-ray shows cardiomegaly  with pulmonary venous hypertension, mild left basilar scarring.  EKG  without any acute changes.   ASSESSMENT/PLAN:  1. Near-syncopal episode.  2. Diabetes.  3. Hypertension.  4. Chronic renal insufficiency.  5. Congestive heart failure.  6. Morbid obesity.  7. Macrocytic anemia.  Will admit the patient to the hospital.  We      will check MRI, MRA, 2-D echo, check hemoglobin A1c, cardiac      markers.  We will monitor on telemetry tonight.  We will also check      hemoglobin A1c.  If everything turns out to be normal, then the       patient can be discharged to follow up with primary care physician.      Of note, creatinine is 1.3.  We will continue to monitor that.  If      it keeps increasing, she will need to be on other diabetic      medications apart from metformin with decreased creatinine      clearance.      Sharlet Salina, M.D.  Electronically Signed     NJ/MEDQ  D:  12/03/2007  T:  12/03/2007  Job:  [id number]

## 2010-08-04 NOTE — Assessment & Plan Note (Signed)
[facility]                         ELECTROPHYSIOLOGY OFFICE NOTE   [name], [name]                  MRN:          [id number]  DATE:05/26/2007                            DOB:          08/14/1945    [name] returns today for followup.  She is a very pleasant obese  middle-age woman with a nonischemic cardiomyopathy, who is status post  ICD insertion which was complicated initially by pericardial effusion  followed by lead dislodgement x2, resulting in multiple ICD discharges.  She ultimately had removal of her system.  She returns today for  followup.  It has been now over a year since her defibrillator was  removed.  She still occasionally has some recurrences and nightmares  about defibrillator going off but overall has been better.  She is  exercising on a regular basis.  Her weight is down on home scales to  270.  She denies chest pain .  Interestingly, her 2-D echocardiogram  following removal of the device demonstrated that her EF had gone up to  approximately 45-50%.  She had no specific complaints otherwise.   MEDICATIONS:  1. Digoxin 0.25 a day.  2. Metformin 1 gram twice daily.  3. Aldactone 25 a day.  4. Glipizide 10 a day.  5. Furosemide 40 a day.  6. Aspirin 81 a day.  7. Diovan 80 a day.   PHYSICAL EXAMINATION:  GENERAL:  She is a pleasant obese middle-age  woman in no acute distress.  VITAL SIGNS:  Blood pressure today was 126/73. The pulse 75 and regular.  Respirations were 18.  Weight was 276 pounds.  NECK:  Revealed no jugular distention.  LUNGS:  Clear bilaterally to auscultation.  No wheezes, rales or rhonchi  are present.  CARDIOVASCULAR:  Regular rate and rhythm.  Normal S1 and a split S2.  EXTREMITIES:  Demonstrated trace peripheral edema bilaterally.   IMPRESSION:  1. Nonischemic cardiomyopathy with improvement in left ventricular      function.  2. Status post implantable cardioverter-defibrillator  insertion with      multiple complications secondary to implant.  3. Morbid obesity.   DISCUSSION:  [name] is stable today.  She still have some  nightmares about defibrillator complications that we have had but  overall is improving gradually and remains  stable.  She is continuing to exercise and otherwise doing well.  I will  plan to see her back in 6 months.     Champ Mungo. [name], MD  Electronically Signed    GWT/MedQ  DD: 05/26/2007  DT: 05/28/2007  Job #: [id number]   cc:   Barrie Folk. Berdine Addison, MD

## 2010-08-04 NOTE — Assessment & Plan Note (Signed)
[facility]                         ELECTROPHYSIOLOGY OFFICE NOTE   Victoria, [name]                  MRN:          [id number]  DATE:12/20/2007                            DOB:          1945-05-07    [name] returns today for followup.  She is a very pleasant obese  middle-aged woman with a history of nonischemic cardiomyopathy,  congestive heart failure, morbid obesity, hypertension, and diabetes.  The patient returns today for followup.  She underwent ICD implantation  several years ago with ICD lead dislodging twice resulting in multiple  ICD shocks.  Ultimately, the device was removed.  Following this, the  patient underwent repeat echocardiography.  His heart failure symptoms  in fact improved and her LV function have been found to improve as well.  Now, her EF was approximately 45-50%.  The patient has been stable since  I last saw her.  She initially had very severe problems with sleep and  difficulty with flashbacks from ICD shocks, these are improving.  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  She recently had some problems with an  arch on her foot and is in boot today.   Her medicines include digoxin 0.25 a day, metformin 1 g twice daily,  Aldactone 25 a day, glipizide 10 a day, furosemide 40 a day, aspirin 81  a day and Diovan 80 mg half tablet daily.  She is also on Coreg 25 mg  twice daily.   PHYSICAL EXAMINATION:  GENERAL:  She is a pleasant well-appearing obese  woman, in no distress.  VITAL SIGNS:  Blood pressure was 113/70, the pulse 69 and regular, and  respirations were 18.  NECK:  No jugular distention.  LUNGS:  Clear bilaterally to auscultation.  No wheezes, rales, or  rhonchi are present.  There is no increased work of breathing.  CARDIAC:  Regular rate and rhythm.  Normal S1 and S2.  ABDOMINAL:  Obese, nontender, and nondistended.  EXTREMITIES:   No peripheral edema.   IMPRESSION:  1. Nonischemic cardiomyopathy.  2. Congestive heart failure presently class I-II.  3. Hypertension.  4. Diabetes.  5. Obesity.   DISCUSSION:  I have discussed the treatment with [name].  She is  presently stable.  I have encouraged her about the importance of  continued weight loss and regular exercise as she is able to.  I will  see her back in several months.     Champ Mungo. [name], MD  Electronically Signed   GWT/MedQ  DD: 12/20/2007  DT: 12/21/2007  Job #: [id number]

## 2010-08-04 NOTE — Assessment & Plan Note (Signed)
[facility]                         ELECTROPHYSIOLOGY OFFICE NOTE   LEGACY, KUZNIK                  MRN:          [id number]  DATE:09/01/2006                            DOB:          Nov 04, 1945    Ms. Ohman returns today for follow-up.  She is a very pleasant middle-  aged woman with a history of nonischemic cardiomyopathy status post ICD  insertion, status post multiple ICD shocks secondary to recurrent ICD  lead dislodgements, who returns today for follow-up.  The patient had  her defibrillator removed completely.  Her main complaint today is that  of fatigue and weakness, which she describes as being present for the  last several weeks.  Interestingly enough, most recent 2 D echo  demonstrated that her LV function had, in fact, improved.   PHYSICAL EXAMINATION:  GENERAL:  On physical examination today the  patient is a pleasant, morbidly obese middle-aged woman in no acute  distress.  VITAL SIGNS:  The blood pressure was 122/74, the pulse 60 and regular,  the respirations were 18.  The weight was 280 pounds.  NECK:  No jugular venous distention.  LUNGS:  Clear bilaterally to auscultation.  There were no wheezes, rales  or rhonchi.  CARDIOVASCULAR:  Regular rate and rhythm with normal S1, S2.  EXTREMITIES:  No clubbing, cyanosis, or edema.  Pulses were 2+ and  symmetric.   IMPRESSION:  1. Nonischemic cardiomyopathy.  2. Congestive heart failure class II with recent improvement in her      left ventricular function, approaching near normal by echo.  3. Fatigue and weakness and shortness of breath.   DISCUSSION:  The etiology of the patient's symptoms is unclear.  I am  concerned that perhaps now that her LV function is better, she does not  require as much of her heart failure medications and to this end, she  has had her Diovan decreased to 80 mg daily by Dr. Berdine Addison and I have also  recommended that she decrease her Coreg to 6 mg  twice daily.  With  regard to her diuretic, I would ask that she weigh herself daily and  stop her Lasix unless her weight goes over 182 pounds.  I will see her  back in several weeks.     Champ Mungo. [name], MD  Electronically Signed    GWT/MedQ  DD: 09/01/2006  DT: 09/02/2006  Job #: [id number]   cc:   Barrie Folk. Berdine Addison, MD

## 2010-08-04 NOTE — Assessment & Plan Note (Signed)
[facility]                         ELECTROPHYSIOLOGY OFFICE NOTE   [name], [name]                  MRN:          [id number]  DATE:12/08/2006                            DOB:          March 29, 1945    [name] returns today for followup.  She is a very pleasant,  morbidly obese woman with nonischemic cardiomyopathy who underwent ICD  implantation with two episodes of lead dislodgement resulting in  multiple ICD shocks, ultimately undergoing lead removal.  She returns  today for followup.  She continues to complain of some anxiety and  feelings of being afraid.  She denies chest pain, she denies shortness  of breath.  She continues to travel and otherwise has been stable.  She  has continued to try to lose weight and has lost down from over 330  pounds to 275.  She does have some frustration over her inability to  lose additional weight.   MEDICATIONS:  Include digoxin 250 mcg daily, Metformin 1000 mg twice  daily, Aldactone 25 mg daily, glipizide 10 mg daily, furosemide 40 mg  daily, aspirin 81 mg daily, Diovan 160 1/2 tablet daily, and Coreg 25 mg  1/2 tablet daily.   EXAMINATION:  She is a pleasant, obese middle-aged woman in no distress.  VITAL SIGNS:  Blood pressure 124/66, pulse 66 and regular, respirations  are 16, weight was 275 pounds.  NECK:  Revealed no jugular venous distension.  LUNGS:  Are clear bilaterally to auscultation, no wheezes, rales or  rhonchi are present.  CARDIOVASCULAR EXAM:  Revealed a regular rate and rhythm with normal S1,  S2.  EXTREMITIES:  Demonstrated trace peripheral edema bilaterally.   IMPRESSION:  1. Nonischemic cardiomyopathy.  2. Status post ICD insertion status post removal.  3. Morbid obesity.  4. Congestive heart failure with improvement of LV function post lead      removal.   DISCUSSION:  Overall [name] has been stable.  I have talked to her  about different options with regard to  treatment of her anxiety  regarding multiple ICD shocks.  I have offered her referral to a medical  doctor, Dr. Berdine Addison as well as for psychiatric evaluation if she would  like.  She refuses these at present but states that she does plan to go  back to see Dr. Berdine Addison.     Champ Mungo. [name], MD  Electronically Signed    GWT/MedQ  DD: 12/08/2006  DT: 12/08/2006  Job #: [id number]   cc:   Barrie Folk. Berdine Addison, MD

## 2010-08-07 NOTE — Op Note (Signed)
NAMEADALIZ, [name] NO.:  [id number]   MEDICAL RECORD NO.:  [id number]          PATIENT TYPE:  INP   LOCATION:  2912                         FACILITY:  [facility]   PHYSICIAN:  Deboraha Sprang, MD, FACCDATE OF BIRTH:  [birth year]   DATE OF PROCEDURE:  06/06/2006  DATE OF DISCHARGE:                               OPERATIVE REPORT   PREOPERATIVE DIAGNOSIS:  Defibrillator lead migration with inappropriate  shocks.   POSTOPERATIVE DIAGNOSIS:  Defibrillator lead migration with  inappropriate shocks.   PROCEDURE:  Explantation of a previously-implanted lead and removal of  the defibrillator generator.   Following the obtaining of informed consent, the patient was brought to  the lab and placed on the fluoroscopic table in supine position.  After  routine prep and drape of the left upper chest, lidocaine was  infiltrated along the line of the previous incision and carried down to  the layer of the device pocket using electrocautery and sharp  dissection.  The pocket was opened.  The device was explanted and  removed from the lead.  With care then, the lead was freed up from the  scar tissue to assess what might have been the problem.  As I had  feared, the lead moved with the secured collar.  The sutures around the  collar were tight but obviously not tight enough.  The lead screw was  retracted.  It could be seen to free up from something in the RV.  It  fell into the low RA and then it was withdrawn without complication.  Pressure was held.  The wound was then copiously irrigated with  antibiotic-containing saline solution.  The anterior and posterior  aspects of the pocket were closed with 2-0 Vicryl.  The wound was closed  in three layers in the normal fashion.  The wound was washed, dried, and  a benzoin and Steri-Strip dressing was applied.  Needle counts, sponge  counts and instrument counts were correct at the end of the procedure  according to the staff.  The  patient tolerated the procedure without  apparent complication.      Deboraha Sprang, MD, [facility]  Electronically Signed     SCK/MEDQ  D:  06/06/2006  T:  06/06/2006  Job:  [id number]

## 2010-08-07 NOTE — Assessment & Plan Note (Signed)
[place]                         ELECTROPHYSIOLOGY OFFICE NOTE   HEELA, FRYDMAN                  MRN:          [id number]  DATE:06/23/2006                            DOB:          May 28, 1945    Ms. Sturch returns today for followup.  She is a very pleasant, obese,  middle-aged woman with a nonischemic cardiomyopathy and congestive heart  failure who is status post ICD insertion who had her procedure  complicated initially by a pericardial effusion requiring pericardial  drainage and this was followed by ventricular lead dislodgments  resulting in multiple ICD shocks status post initial lead removal,  status post insertion of a new lead, status post recurrent lead  displacement resulting in recurrent multiple (greater than 50) ICD  shocks.  Since the last episode, which occurred several weeks ago, her  defibrillator lead has been removed and she returns today for followup.  She was initially very frightened and scared and apprehensive following  ICD removal but has continued to improve and do well.  She notes that  she has occasional memories of the event with her multiple ICD shocks.  However, overall she is better.  She has started back walking and does  nearly a mile per day.  She has had no dizziness or lightheadedness and  has otherwise been stable.   PHYSICAL EXAMINATION:  GENERAL:  She is a pleasant, obese, middle-aged  woman in no acute distress.  VITAL SIGNS:  The blood pressure today was 116/72, the pulse was 77 and  regular, the respirations were 18, the weight was 280 pounds.  NECK:  Revealed no jugular venous distention.  LUNGS:  Clear bilaterally to auscultation.  There were no wheezes,  rales, or rhonchi.  CARDIOVASCULAR:  Revealed a regular rate and rhythm with normal S1 and  S2.  There were no murmurs, rubs or gallops.  EXTREMITIES:  Demonstrated no cyanosis, clubbing or edema.   IMPRESSION:  1. Status post  implantable cardioverter-defibrillator insertion      secondary to nonischemic cardiomyopathy and congestive heart      failure.  2. Multiple implantable cardioverter-defibrillator discharges      secondary to recurrent lead dislodgments.   DISCUSSION:  Ms. Huss is stable and is doing better without any ICD  in place.  Because of her recurrent and repetitive repeat dislodgment of  her defibrillator lead, I have recommended that she not undergo  replacement of this at the present time.   We will plan on seeing her back on a regular followup for treatment of  her heart failure.     Champ Mungo. [name], MD  Electronically Signed    GWT/MedQ  DD: 06/23/2006  DT: 06/23/2006  Job #: [id number]

## 2010-08-07 NOTE — Discharge Summary (Signed)
[name], [name] NO.:  [id number]   MEDICAL RECORD NO.:  [id number]          PATIENT TYPE:  INP   LOCATION:  [id number]                         FACILITY:  [facility]   PHYSICIAN:  Blane Ohara, MD   DATE OF BIRTH:  07-19-1945   DATE OF ADMISSION:  10/11/2005  DATE OF DISCHARGE:                                 DISCHARGE SUMMARY   PROCEDURE:  1.  Urgent pericardiocentesis.  2.  Implantable cardiac defibrillator lead revision.  3.  2-D echocardiogram.  4.  Insertion of PICC line.  5.  Transfusion of one unit of packed cells.   PRIMARY DIAGNOSIS:  Pericardial tamponade.   SECONDARY DIAGNOSES:  1.  Implantable cardiac defibrillator lead dislodgement/micro perforation      with pericardial effusion resulting in poor pacing and sensing      thresholds.  2.  Congestive heart failure.  3.  Acute renal insufficiency with a peak BUN and creatinine of 38/1.5.  4.  Hypotension requiring dopamine.  5.  Iron deficiency anemia.  6.  Nonischemic cardiomyopathy with an ejection fraction of 25% to 30% by      echocardiogram this admission.  7.  Diabetes.  8.  Morbid obesity.  9.  Status post [place] plus fibrillator on [phone].  10. Severe tricuspid regurgitation.  11. Hypertension  12. Status post cholecystectomy and stomach stapling procedure as well as      removal of an ovarian cyst.  13. Chronic bronchitis.   ALLERGIES OR INTOLERANCES:  To medications are none.   TIME OF DISCHARGE:  42 minutes.   HOSPITAL COURSE:  [name] is a 65 year old female with a history of  nonischemia cardiomyopathy who had a St. Jude single chamber ICD implanted  on October 06, 2005.  She became dizzy while talking on the phone as well as  having nausea and chest pressure.  She was brought to the [facility]  emergency room where her systolic blood pressure was 72 mmHg.  She was fluid  resuscitated and was orthostatic.  She was transferred to [facility] urgently for  further evaluation and treatment.   She was felt to have a vasovagal event and her defibrillator has not fired.  She was noted to have a very small posterior perfusion and micro perforation  was a possibility, so a 2-D echocardiogram was obtained.  The 2-D  echocardiogram showed a small pericardial effusion that was 11 mm in maximal  dimension.  She continued to have orthostatic symptoms of dizziness and  hypotension, so her fluid resuscitation was continued and a chest CT with  contrast was obtained.  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  There were no focal findings in the pelvis.  Her blood  pressure medications were held and she was monitored closely.   On October 13, 2005, she developed more pain and her systolic blood pressure  dropped again.  She was diaphoretic and had a pulsus paradoxus of 8 to 10  mmHg.  She was in  sinus tachycardia.  She was transferred to ICU and fluid  resuscitation was continued.   Dr. [name] evaluated her and felt that she needed ICD lead revision as she  had decrease in values from when the ICD was implanted.  This was performed  on October 14, 2005.  Dr. [name] found that the patient's lead had dislodged  and was moving back and forth with a screw extended.  He felt that this  explained her pericardial effusion.  The lead was repositioned on the RV  septum.  The impedance and threshold as well as R-waves were all good.  Defibrillator threshold testing was not performed secondary to her being  hypotensive and on dopamine.   After the lead revision, she had a drop in her urine output and blood  pressure, and a repeat 2-D echocardiogram showed marked increase in her  pericardial effusion with mild right ventricular chamber collapse.  It was  felt that she needed pericardiocentesis and this was performed on October 14, 2005, and a drain was placed.  Her heart  rate decreased 120 to 90 and her  blood pressure increased [id number] to [id number] systolic.  Approximately 700 mL of fluid  was removed.   That night [name] had problems with pain and hypertension, but her  heart rate and blood pressure improved, and it was felt that this was  pericardial related pain.  She was monitored closely.   [name] was noted to be anemic on admission with a hemoglobin of 7.3  and a hematocrit of  32.8.  After the procedures her hemoglobin dropped to  8.6 with a hematocrit of 26.4.  She was found to be iron deficient and  advised [place] that she was taking iron at home prior to admission although this  was not on the med list.  She states that she had been evaluated by an  oncologist in [place] and was following up with him.  She was started back  on iron supplementation and is to continue this.  At discharge, her  hemoglobin was 9.0 with a hematocrit of 27.   [name] has problems with volume overload and fluid  resuscitation and her Lasix was restarted.  Her blood pressure remained low  normal and her Coreg and Diovan continued to be held, although they were  eventually started at decreased doses.  She had renal insufficiency during  her acute event with a peak BUN/creatinine 38/1.5.  This improved and her  BUN was 23 with a creatinine 1.0 at discharge.  As her condition improved,  she was restarted on her diabetes medications well.  She was put in for a  home health RN and aide to help her in recovery, but it was not felt that  inpatient rehab was indicated.   By October 19, 2005,  [name] was ambulating without difficulty and felt  that her respiratory status was at baseline.  Of note, her BNP was elevated  in 575, but she had no symptoms of overload and her oxygen saturation was  96% on room air.  An iron profile had been performed which showed an iron  level of 28, with a TIBC of 218, 13% saturation and UIBC 190.  A ferritin level was within  normal limits at 183.  She had received one unit of packed  cells during her admission.   On October 19, 2005, [name] was evaluated by Dr. Burt Knack.  She is to  follow up in our office  with a physician extender visit and an  echocardiogram at the time of her wound check.  She was discharged on a  reduced dose of Coreg and Diovan, which can be up titrated as an outpatient  at previous doses and she will tolerate.  [name] was considered stable  for discharge on October 19, 2005, with outpatient follow-up arranged.   DISCHARGE INSTRUCTIONS:  Her activity level is to be increased slowly.  She  is to stick to a low-fat diabetic diet.  She is to call our office for any  problems with the incision.  She is not to drive for a week and she is not  to not lift anything with that arm for six weeks.   DISCHARGE MEDICATIONS:  1.  Coreg 12.5 mg one half tab b.i.d.  2.  Diovan 160 mg one half tablet daily.  3.  Digoxin 0.25 mg daily.  4.  Prilosec 40 mg a day.  5.  Metformin 1000 mg b.i.d.  6.  Aldactone 25 mg a day.  7.  Glipizide 10 mg a day.  8.  Lasix 40 mg a day.  9.  Aspirin 81 mg a day.  10. Allegra p.r.n.   She has an appointment on August 8 at [facility] in [place] and  at that time is to get an echocardiogram at 8:30, to see Dr. Tanna Furry  physician extender at 9:15 together with her pacer check at 10 a.m. She will  follow up with Dr. [name] after that and with Dr. Wilhemina Cash in six months.  She  is to follow up with Dr. Alferd Patee as previously scheduled.      Rosaria Ferries, P.A. [facility]      Blane Ohara, MD  Electronically Signed    RB/MEDQ  D:  10/19/2005  T:  10/19/2005  Job:  [id number]

## 2010-08-07 NOTE — Op Note (Signed)
Victoria, [name] NO.:  [id number]   MEDICAL RECORD NO.:  [id number]          PATIENT TYPE:  INP   LOCATION:  2029                         FACILITY:  [facility]   PHYSICIAN:  Deboraha Sprang, MD, FACCDATE OF BIRTH:  07-Aug-1945   DATE OF PROCEDURE:  02/22/2006  DATE OF DISCHARGE:                               OPERATIVE REPORT   PREOPERATIVE DIAGNOSIS:  Previously implanted defibrillator with  inappropriate shocks, related to posterior dislocation of the RV  defibrillator lead.   POSTOPERATIVE DIAGNOSIS:  Previously implanted defibrillator with  inappropriate shocks, related to posterior dislocation of the RV  defibrillator lead.   PROCEDURES:  1. Contrast sonography explantation of the previously implanted lead.  2. Implantation of a new lead with intraoperative defibrillation      threshold testing.   DESCRIPTION OF PROCEDURE:  Following, we obtained informed consent, the  patient was brought to the electrophysiology laboratory on the  fluoroscopic stable in supine position.  Contrast sonogram was obtained,  demonstrating the patency of the extrathoracic, left subclavian vein.  Lidocaine was infiltrated over the previous incision after routine prep  and drape and carried down to the layer of the device pocket using sharp  dissection and electrocautery.  The pocket was open.  It was full of a  clear serous fluid.  It may have been some lidocaine infiltrated.  The  device was explanted and then lead was freed up from the underlying scar  tissue.  All retained sutures were removed.  At this point, prior to  explantation of the previous lead.  I got venous access and placed a  guidewire.  The helix of the implanted lead was retracted; it took about  50 turns.  The lead was then removed with counter-clockwise rotation and  was removed without difficulty.  At this point, a 9-French sheath was  placed over the retained guidewire and through this was passed a  [employer] [id number] 65-cm dual coil active fixation  defibrillator lead, serial [id number] and under fluoroscopic guidance  was manipulated in the right ventricular apex with a bipolar R-wave of  11 with the pace impedance of 881, a threshold of 0.9 volts, 0.5  milliseconds, current threshold to 1.4 mA.  There was no diaphragmatic  pacing at 10 volts.  This lead was secured to the pre-pectoral fashion  and then attached back to the [place] serial [id number].  Through the device, the bipolar R-wave was 9.3 with a pacing impedence  of 690, threshold to 0.75 and 0.5.  The pocket floor was then excised.  The pocket was copiously irrigated with antibiotic-containing saline  solution.  Hemostasis was assured and then the lead and the pulse  generator placed in the pocket.  Ventricular fibrillation was then  induced via the T wave shock.  After a total duration of 5 seconds, a 15  joule shock was delivered through of measured resistance 32 ohms,  terminated ventricular rate fibrillation, restoring sinus rhythm.  I  should note that the pre-induction high-voltage threshold was  36 ohms.  The wound was then closed in three layers in  formal fashion.  The wound was washed, dried, and benzoin Steri-Strips applied.  Needle  counts, sponge counts, and instruments were correct at the end of the  procedure, according to the staff.  The patient tolerated the procedure  without apparent complication.      Deboraha Sprang, MD, [facility]  Electronically Signed     SCK/MEDQ  D:  02/22/2006  T:  02/23/2006  Job:  [id number]   cc:   Electrophysiology Laboratory  [place]:  [facility]

## 2010-08-07 NOTE — H&P (Signed)
NAMESATURNINA, ZIEMBA NO.:  [id number]   MEDICAL RECORD NO.:  [id number]          PATIENT TYPE:  INP   LOCATION:  2912                         FACILITY:  [facility]   PHYSICIAN:  Satira Sark, MD DATE OF BIRTH:  1945-12-16   DATE OF ADMISSION:  06/05/2006  DATE OF DISCHARGE:                              HISTORY & PHYSICAL   PRIMARY CARDIOLOGIST:  Champ Mungo. [name], M.D.   REASON FOR ADMISSION:  Multiple defibrillator discharges.   HISTORY OF PRESENT ILLNESS:  Ms. Hemeon is a pleasant 65 year old  woman with history of a nonischemic cardiomyopathy with previously  documented ejection fraction in the 25-30% range, status post  defibrillator placement in July of 2007.  This was complicated by lead  dislodgement and microperforation, ultimately with pericardial tamponade  requiring pericardiocentesis.  She was admitted to the hospital in  December of 2007 with multiple defibrillator discharges due to lead  displacement and underwent explantation of the displaced right  ventricular lead with implantation of a Medtronic Sprint Quattro lead.  The patient has a Training and development officer defibrillator that was left in place.  She states that she has done fairly well.  She has had no recent  problems with limiting dyspnea, chest pain, or palpitations.  She states  that for the last few days, she has felt that her defibrillator box was  just not quite right.  She seems to relate that it felt a bit more  loose than usual and then this morning after church she suddenly began  to develop defibrillator shocks that occurred multiple times, reported  by EMS to be as many as 35.  Available tracings indicate that the  patient was in sinus rhythm during her shocks, suggesting more than  likely another lead problem.  As yet, there is no chest x-ray to assess  lead placement and labs are pending.  A magnet has been placed on the  device and the patient is comfortable at this time, remaining  in normal  sinus rhythm.  She reports no other major interval change.   ALLERGIES:  No known drug allergies.   CURRENT MEDICATIONS:  1. Digoxin 0.25 mg p.o. daily.  2. Coreg 12.5 mg p.o. b.i.d.  3. Metformin 1000 mg p.o. b.i.d.  4. Spironolactone 25 mg p.o. daily.  5. Glipizide 10 mg p.o. daily.  6. Lasix 40 mg p.o. daily.  7. Aspirin 81 mg p.o. daily.  8. Iron supplements.  9. Multivitamin one p.o. daily.  10.Diovan 80 mg p.o. daily.  11.Allegra p.r.n.   PAST MEDICAL HISTORY:  As outlined above.  Additional problems include  type 2 diabetes mellitus, hypertension, renal insufficiency, tricuspid  regurgitation described as severe based on previous office notes.   SOCIAL HISTORY:  The patient lives in [place].  She denies any  alcohol or tobacco use.   FAMILY HISTORY:  Reviewed and is noncontributory at this point.   REVIEW OF SYSTEMS:  As described in history of present illness.  The  patient has been in her usual state of health otherwise.   PHYSICAL EXAMINATION:  VITAL SIGNS:  Blood pressure 150/90, heart rate  95 and sinus rhythm.  Oxygen saturation is 100% on face mask,  respirations 24.  GENERAL:  This is a morbidly obese woman, anxious, but in no acute  distress.  HEENT:  Conjunctivae and lids normal, oropharynx clear.  NECK:  Supple without elevated jugular venous pressure or loud bruits.  No thyromegaly is noted.  LUNGS:  Clear without labored breathing.  HEART:  Regular rate and rhythm without loud murmur or gallop.  Pacemaker device incision well-healed and intact in the upper left  thorax.  ABDOMEN:  Soft, no obvious hepatomegaly, no bruits.  Bowel sounds  present.  EXTREMITIES:  1+ edema.  SKIN:  Warm and dry.  MUSCULOSKELETAL:  No kyphosis is noted.  NEUROLOGY:  The patient is alert and oriented x3.   IMPRESSION:  1. Multiple defibrillator shocks based on available information,      presumably during normal sinus rhythm, suggesting lead malfunction       particularly based on her prior history.  Cardiac status has been      otherwise stable.  She had a lead revision back in December of      2007.  2. Nonischemic cardiomyopathy with ejection fraction of 25-30%, status      post placement of a St. Jude Atlas defibrillator in July of [id number]      complicated by microperforation and pericardial effusion with      tamponade requiring pericardiocentesis.  3. Hypertension.  4. Type 2 diabetes mellitus.   PLAN:  The patient is now admitted to the coronary care unit.  We have  contacted the St. Jude device representative who will be coming to  interrogate the device and most likely turn it off for the time-being.  Chest x-ray will be obtained to look at lead position and baseline labs  will also be obtained.  We will continue her outpatient regimen and  otherwise plan on having Dr. [name] and the electrophysiology team  evaluate the patient in the morning.      Satira Sark, MD  Electronically Signed     SGM/MEDQ  D:  06/05/2006  T:  06/06/2006  Job:  [id number]   cc:   Champ Mungo. [name], MD

## 2010-08-07 NOTE — Assessment & Plan Note (Signed)
[place]                              CARDIOLOGY OFFICE NOTE   Victoria, [name]                    MRN:          [id number]  DATE:10/27/2005                            DOB:          [birth year]    SUBJECTIVE:  [name] is a very pleasant 66 year old female patient with  a history of known ischemic cardiomyopathy with an EF of 25-30% who recently  underwent St. Jude single-chamber ICD implantation.  She represented back to  the hospital on July 23 with hypotension.  She was eventually noted to have  pericardial tamponade from pericardial effusion.  She had ICD lead  dislodgement and microperforation.  She had a revision done of her ICD lead  on July 26 by Dr. [name].  She continued to have pericardial effusion and  eventually had to have pericardiocentesis.  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  She also had anemia noted and received packed red blood cells x1 unit.  She  apparently follows with an oncologist in [place] who follows her blood  counts.  She had to have her medications reduced secondary to her  hypotension.  She returns to the office today for followup post  hospitalization.  She is doing well.  She denies any chest pain or  significant shortness of breath.  She denies any orthopnea or paroxysmal  nocturnal dyspnea.  Denies any syncope.  She has had a cough for the last  week that is productive of a white sputum.  She denies any hemoptysis or  purulent sputum.  Denies any fevers or chills.   CURRENT MEDICATIONS:  1.  Coreg 12.5 mg one-half tablet b.i.d.  2.  Diovan 160 mg one-half tablet daily.  3.  Digoxin 0.25 mg daily.  4.  Prilosec 40 mg a day.  5.  Metformin 1 g b.i.d.  6.  Aldactone 25 mg daily.  7.  Glipizide 10 mg daily.  8.  Lasix 40 mg daily.  9.  Aspirin 81 mg daily.  10. Multivitamin with iron.  11. Allegra p.r.n.   ALLERGIES:  No  known drug allergies.   PHYSICAL EXAMINATION:  GENERAL:  She is a well-developed, well-nourished  female in no acute distress.  VITAL SIGNS:  Blood pressure is 131/83, pulse 85, weight 293 pounds.  HEENT:  Unremarkable.  I cannot appreciate any JVD.  CARDIAC:  S1, S2.  Regular rate and rhythm.  No rubs, no gallops.  LUNGS:  Clear to auscultation bilaterally without wheezing, rhonchi, or  rales.  No egophony.  ABDOMEN:  Soft.  EXTREMITIES:  With trace edema bilaterally.  Calves are soft and nontender  bilaterally.   Electrocardiogram reveals sinus rhythm with a heart rate of 85,  anterolateral T wave changes, no old tracings to compare.   IMPRESSION:  1.  Status post recent admission for pericardial tamponade secondary to AICD      lead dislodgment and microperforation with pericardial effusion.      1.  Status post pericardiocentesis and drain.  2.  Status post ICD lead revision.  2.  Ischemic cardiomyopathy with ejection fraction of 25-30% status post      AICD implantation.  3.  Renal insufficiency.  4.  Diabetes mellitus.  5.  Morbid obesity.  6.  History of severe tricuspid regurgitation.  7.  History of hypertension.  8.  History of stomach stapling secondary to morbid obesity.   PLAN:  The patient is stable from a cardiovascular standpoint today.  She is  doing fairly well without much in the way of dyspnea and she notes that her  weight continues to go down since hospitalization.  She did have some volume  overload while she was admitted.  At this point in time she seems to be  fairly euvolemic.  She had a repeat echocardiogram performed today and will  follow up on that later today.  Will also get a BMET to follow up on her  renal function and potassium.  We will also follow up a CBC and she sees her  oncologist some time in the next week or two to follow up on her blood  counts.  She seems to be stable enough to have her medications titrated back  to where they  were before.  I will start with increasing her Coreg back to  12.5 mg one whole tablet twice a day.  I will have her follow up with Dr.  Wilhemina Cash in the next 2 weeks in [place] for further titration of her  medications.  If her blood pressure is still stable at that time she can  have her Diovan increased back to a whole tablet a day.  She already has  followup set up with Dr. [name].                                  Richardson Dopp, PA-C                            Marijo Conception. Verl Blalock, MD, [facility]   SW/MedQ  DD:  10/27/2005  DT:  10/27/2005  Job #:  [id number]   cc:   Renne Musca, M.D.

## 2010-08-07 NOTE — Discharge Summary (Signed)
NAMELAYNEY, FEELEY NO.:  [id number]   MEDICAL RECORD NO.:  [id number]          PATIENT TYPE:  INP   LOCATION:  [id number]                         FACILITY:  [facility]   PHYSICIAN:  Satira Sark, MD DATE OF BIRTH:  1946/03/19   DATE OF ADMISSION:  06/05/2006  DATE OF DISCHARGE:  06/09/2006                               DISCHARGE SUMMARY   ALLERGY:  NO KNOWN DRUG ALLERGIES.   PRINCIPAL DIAGNOSES:  1. Admitted with multiple defibrillator discharges (around 70).  2. Discharging day #3 status post explantation of right ventricular      lead, explant of cardioverter defibrillator generator.  Found      mobile lead sliding through sutured sleeve.  3. Echocardiogram June 07, 2006, ejection fraction 40-45% with mild      tricuspid regurgitation.  This is an improvement over previous      ejection fraction known to be 25-30%.  4. Troponin I studies after discharging from the cardioverter      defibrillator are 0.86-1.57-0.98.  5. BNP was 100 this admission.   SECONDARY DIAGNOSES:  1. Nonischemic cardiomyopathy, previous ejection fraction 25-30%.      Now, the ejection fraction is BETTER.  2. Implanted cardioverter defibrillator July 2007 with subsequent lead      dislodgement, microperforation, with pericardial tamponade      requiring pericardiocentesis.  3. Hospitalized with implantable cardioverter-defibrillator discharges      for lead displacement with revision in December 2007.  4. Type 2 diabetes.  5. Hypertension.  6. Renal insufficiency.  7. Morbid obesity.  8. 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   PROCEDURE:  June 06, 2006, explant of cardioverter defibrillator and  right ventricular lead by Dr. Virl Axe.  No post procedural  complications.  The wound is healing nicely without hematoma.   BRIEF HISTORY:  Ms. Stenson is a 65 year old female.  She has a history  of nonischemic cardiomyopathy.  Her  previous ejection fraction was 25-  30%.  She had an ICD implanted July 2007.  The implantation was  complicated by lead dislodgement, microperforation.  She had pericardial  tamponade which required pericardiocentesis.   She was admitted to the hospital December 2007.  She had multiple  discharges from her cardioverter defibrillator at that time, and they  were secondary to lead displacement.  She underwent explantation of the  right ventricular lead and a Medtronic Sprint Quattro lead was placed.   The patient states that since her admission in December, she has done  fairly well.  She has had no problems with dyspnea, chest pain, or  palpitations.  However, in the last few days, she felt her defibrillator  generator box was just not quite right.  She seems to feel that  internally things were a bit more loose than usual, and after [place] on  the morning of March 16, she developed defibrillator therapies which  occurred multiple times reported by [facility] to be as  many as 35.  The patient was in sinus rhythm during her shocks which  suggests another lead  problem.  A magnet has been put on the device, and  the patient is comfortable maintaining sinus rhythm.  She reports no  other major interval change.  The plan is to admit to the [facility]  [facility] to have the St. Jude device representative interrogate the device  and probably turn it off.  Chest x-ray will be obtained to look at lead  position.  Baseline labs will also be obtained.  The Electrophysiology  Team will consult.   HOSPITAL COURSE:  The patient admitted March 16 with multiple  cardioverter defibrillator discharges.  These were found to be secondary  to lead dislodgement.  She underwent explantation of the right  ventricular lead and the device after conference by Dr. Caryl Comes and Dr.  [name].  The patient has done well after the device was explanted.  Has  had no ventricular ectopy this hospitalization  and is maintaining sinus  rhythm.  Her incision is healing nicely.  She discharges postprocedure  day #3.  She continues her regular home medication regimen, and she has  follow up with Dr. [name] on Thursday, April 3, at 9:30.   DISCHARGE MEDICATIONS:  1. Digoxin 0.25 mg daily.  2. Coreg 12.5 mg twice daily.  3. Metformin 1000 mg twice daily.  4. Spironolactone 25 mg daily.  5. Glipizide 10 mg daily.  6. Furosemide 40 mg daily.  7. Enteric-coated aspirin 81 mg daily.  8. Iron tablet daily.  9. Multivitamin daily.  10.Diovan 160 mg tablets one-half tablet daily.  11.Allegra as needed.   LABORATORY STUDIES THIS ADMISSION:  Complete blood count:  Hemoglobin  10.6, hematocrit 33.1, white cells 7.5, platelets 162,000, mean cell  volume 74.  Serum electrolytes:  Sodium 136, potassium 4.2, chloride  102, carbonate 27, BUN 25, creatinine 0.92, glucose 152, Pro time 15.6,  INR 1.2, alkaline phosphatase 73.  SGOT is 23.  SGPT is 16.  Troponin I  studies once again 0.86 then 1.57 then 0.98.  Her CKs this admission had  been steadily increasing over the first 48 hours.  On admission, they  were 297.  After 12 hours, they were 509.  After a further 12 hours,  they were 634.  BNP was 100 on admission.  Greater than 35 minutes.      Sueanne Margarita, [place]      Satira Sark, MD  Electronically Signed    GM/MEDQ  D:  06/09/2006  T:  06/09/2006  Job:  [id number]   cc:   Champ Mungo. [name], MD  Barrie Folk. Berdine Addison, MD

## 2010-08-07 NOTE — Procedures (Signed)
NAME:  [name], [name]                     ACCOUNT NO.:  [id number]   MEDICAL RECORD NO.:  [id number]                   PATIENT TYPE:  OUT   LOCATION:  RAD                                  FACILITY:  [facility]   PHYSICIAN:  Fay Records, M.D.                 DATE OF BIRTH:  January 03, 1946   DATE OF PROCEDURE:  DATE OF DISCHARGE:                                    STRESS TEST   INDICATION:  Ms. Nardone is a 65 year old female with a history of  nonischemic dilated cardiomyopathy, hypertension, diabetes and obesity who  presents with complaints of numbness in her left arm which is nonexertional.  She denies any chest discomfort or shortness of breath.  She had an  echocardiogram done in June of 2003 revealing an ejection fraction of 25%  and a MUGA scan in July of 2003 revealing an ejection fraction of 32%.   BASELINE DATA:  Revealed sinus rhythm at 76 beats per minute with  nonspecific ST abnormality.  Blood pressure 132/78.   DESCRIPTION OF PROCEDURE:  Adenosine 60 mg was infused over a four-minute  protocol.  Cardiolite was injected at three minutes.  The patient reported  shortness of breath, chest fullness and flushing, all of which resolved in  recovery.   EKG revealed a 2:1 AV block episodically during infusion which resolved in  recovery.  There were no ischemic changes noted.   This is a 2-day study.  She will have her resting images done tomorrow.   FINAL IMAGES AND RESULTS:  Pending M.D. review.     ________________________________________  ___________________________________________  Cherre Blanc, P.A. [facility]                      Fay Records, M.D.   AB/MEDQ  D:  05/16/2003  T:  05/16/2003  Job:  [id number]

## 2010-08-07 NOTE — Discharge Summary (Signed)
[name], [name] NO.:  [id number]   MEDICAL RECORD NO.:  [id number]          PATIENT TYPE:  INP   LOCATION:  2029                         FACILITY:  [facility]   PHYSICIAN:  Victoria Sprang, MD, FACCDATE OF BIRTH:  [birth year]   DATE OF ADMISSION:  02/21/2006  DATE OF DISCHARGE:  02/24/2006                               DISCHARGE SUMMARY   ALLERGIES:  No known drug allergies.   PRINCIPAL DIAGNOSIS:  Discharging day #2 status post explant of  displaced right ventricular lead with implant of Medtronic Sprint  Quattro right ventricular lead.  The patient had been admitted with  multiple ICD shocks secondary to this displaced lead.   SECONDARY DIAGNOSES:  1. History of nonischemic cardiomyopathy, ejection fraction 25-30%.  2. Implant of St. Jude Atlas + VR ICD in July of 2007.      a.     Micro perforation/pericardial effusions/status post       pericardiocentesis.  3. Morbid obesity.  4. Hypertension.  5. Class II chronic, systolic congestive heart failure.  6. Diabetes.   PROCEDURE:  February 22, 2006 - successful lead extraction of the  displaced right ventricular ICD lead was new lead placement.  Defibrillator threshold study less than or equal to 15 joules by Dr.  Virl Axe.   BRIEF HISTORY:  [name] is a 65 year old female.  She has  nonischemic cardiomyopathy.  Ejection fraction is 25-30%.  She had ICD  implantation in July of 2007.  This procedure was complicated by micro  perforation which resulted in slow development of a large pericardial  effusion.  She underwent pericardiocentesis secondary to developing  tamponade.  700 cc of serosanguineous fluid was removed by way of  pericardiocentesis.   The patient had the ICD lead revised and placed on the right ventricular  septum, and it was discharged.  She was seen in the office at [facility] several weeks ago and was doing well.   The patient was at church on February 20, 2006.  She  admits that she got  a little more excited than usual, raising her arms back-and-forth up  into the air.  This morning, she was in her usual state of health when  she began to receive multiple cardioverter defibrillator shocks.  She  presented to [facility] and was transferred to [facility] for evaluation.  She does feel quite anxious secondary to the  pain and the fact that she is not quite sure when she might be shocked  again.  She has had no syncope and denies palpitations.   PAST MEDICAL HISTORY:  Additionally is notable for -  1. Hypertension.  2. Diabetes.  3. Morbid obesity.   The multiple shocks are secondary to lead dislodgement the right  ventricular lead.  She is double counting both atrial and ventricular  electrograms, and in the current position for the lead, and this results  in double counting and multiple shocks.  The device has been turned off,  as the patient is admitted.   HOSPITAL COURSE:  The patient was admitted on February 21, 2006 with  multiple ICD shocks.  The device has been disabled.  The shocks are  secondary to right ventricular lead dislodgement with subsequent  counting of both atrial and ventricle contractions, and mistaking these  for a ventricular tachycardia.  The patient underwent extraction of the  __________ lead with placement of a new lead on February 22, 2006.  She  was scheduled for discharge on February 23, 2006, but due to continued  anxiety was observed for another 24 hours.  She will discharge February 24, 2006.  She is discharged with home health follow-up.  The patient  lives alone and is cautioned to keep her arm quiet for the next 4 days.   DISCHARGE MEDICATIONS:  1. Diovan 320 mg daily.  2. Coreg 12.5 mg twice daily.  3. Lasix 40 mg daily.  4. Enteric-coated aspirin 81 mg daily.  5. Spironolactone 25 mg daily.  6. Digoxin 0.25 mg daily.  7. Metformin 1000 mg twice daily.  8. Glipizide 10 mg daily.  9. Iron  tablet 1 tablet daily.  10.Multivitamin daily.  11.Keflex 500 mg to be taken 1/2 hour prior to meals and at bedtime      for the next 5 days.   FOLLOWUP:  1. She has followup with Dunlap in [place] at the [facility]      clinic on Wednesday, March 16, 2006 at 10:40.  2. She has followup with [facility] at the [facility] office      on May 05, 2006 at 9:30 to see Dr. Harrington Challenger.  3. She has followup at [place] office on July 05, 2006 at 12:10 to see the pacer clinic for reprogramming.   Of note, the patient was noted to have anemia, her hemoglobin being in  the 10s.  She had hemoccult positive stool, but the patient is on iron.  Her mean cell volume was low.   PERTINENT LABORATORIES THIS ADMISSION:  Hemoglobin on admission was  10.4, hematocrit 33.1, white cells 4.2 and platelets 165.  Serum  electrolytes on admission on February 21, 2006 revealed sodium of 138,  potassium 4, chloride 104, carbonate 27, BUN is 18, creatinine 0.8,  glucose 116.  Troponin I studies after multiple ICD shocks were 1.04,  then 1.2, then 0.56.  Digoxin level was 0.6.  The mean cell volume was  73.4.      [name], [name]      Victoria Sprang, MD, [facility]  Electronically Signed    GM/MEDQ  D:  02/24/2006  T:  02/24/2006  Job:  [id number]   cc:   Barrie Folk. Berdine Addison, MD  Victoria Sprang, MD, [facility]  Champ Mungo. [name], MD
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
## 2010-08-07 NOTE — Assessment & Plan Note (Signed)
[name]                              CARDIOLOGY OFFICE NOTE   [name], [name]                  MRN:          [id number]  DATE:11/12/2006                            DOB:          [birth year]    SUBJECTIVE:  [name] is a very pleasant 65 year old female patient with  a history of nonischemic cardiomyopathy with an EF of 25-30%.  She had an  ICD put in back at the end of July.  This was complicated by lead  dislodgment and microperforation. She ended up developing tamponade and had  to undergo pericardiocentesis.  She actually saw me back on 10/27/2005 and  was doing well.  She developed some chest discomfort last week and went to  the emergency room. She had a D-dimer which was elevated and she underwent  chest CT.  This  showed a small pericardial effusion and no pulmonary  embolus and patchy infiltrates within the lungs bilaterally.  She was  treated for pneumonia.  She has had minimal cough and no fevers or  chills.  Early this week she had continued right-sided pleuritic chest pain.  This  was associated with some shortness of breath.  Her home health nurse became  concerned and actually got her in to be seen today.  The patient actually  notes that today is better than it has been.  She denies any orthopnea or  paroxysmal nocturnal dyspnea.  She denies any increasing lower extremity  edema.  She denies any syncope or presyncope.  She denies any exertional  chest discomfort.   CURRENT MEDICATIONS:  1. Digoxin 250 mcg daily.  2. Coreg 12.5 mg b.i.d.  3. Metformin 1 g b.i.d.  4. Spironolactone 25 mg a day.  5. Glipizide 10 mg daily.  6. Furosemide 40 mg daily.  7. Aspirin 81 mg daily.  8. Iron.  9. Multivitamin.  10.Diovan 160 mg 1/2 tablet daily.  11.Allegra p.r.n.   ALLERGIES:  NO KNOWN DRUG ALLERGIES.   PHYSICAL EXAMINATION:  GENERAL:  She is a well-nourished, well-developed,  obese female in no acute distress.  VITAL  SIGNS:  Blood pressure is 136/82, pulse 80, weight 299 pounds.  HEENT:  Unremarkable.  NECK:  No JVD.  CARDIAC:  S1 and S2, regular rate and rhythm without rubs.  LUNGS:  Clear to auscultation bilaterally.  EXTREMITIES:  With trace to 1+ edema.  Calves soft and nontender.   Electrocardiogram reveals sinus rhythm with a heart rate of 81. Normal axis,  nonspecific ST-T wave changes, no significant change since previous  tracings.   IMPRESSION:  1. Pleuritic chest pain.  2. Recent diagnosis of community-acquired pneumonia.  3. History of pericardial tamponade secondary to ICD lead dislodgement and      microperforation treated with pericardiocentesis and drain.  4. Nonischemic cardiomyopathy of 25-30%.      a.     Status post AICD implantation.  5. Renal insufficiency.  6. Diabetes mellitus.  7. Morbid obesity.  8. Severe tricuspid regurgitation.  9. Hypertension.  10.History of stomach stapling secondary to morbid obesity.   PLAN:  The patient had a  small effusion on her recent chest CT.  I reviewed  this with Dr. Albertine Patricia.  We do not feel that she needs a repeat echocardiogram  at this point in time. Her pain is clearly pleuritic and is probably related  to her pneumonia. She does have a nonischemic cardiomyopathy and her last  BNP was 575.  We will go ahead and repeat a BMET and a BNP today.  We will  see if it is any higher than that. If it is, then we will go ahead and  adjust her diuretics, otherwise I reassured the patient today.  I have asked  her to use Tylenol and heat to her chest.  She can follow up with Dr. Wilhemina Cash  next week as scheduled and Dr. [name] in October as scheduled.                                  Richardson Dopp, PA-C                              Ethelle Lyon, MD   SW/MedQ  DD:  11/11/2005  DT:  11/11/2005  Job #:  [id number]

## 2010-08-07 NOTE — Procedures (Signed)
[facility]  Patient:    [name], [name] Visit Number: [id number] MRN: [id number]          Service Type: OUT Location: RAD Attending Physician:  Maggie Font Dictated by:   Jacqulyn Ducking, M.D. Proc. Date: 09/11/01 Admit Date:  09/11/2001                              Echocardiograms  REFERRING PHYSICIAN:  Dr. Iona Beard.  CLINICAL DATA:  [age]-year-old woman with palpitations and obesity.  RESULTS: 1. Technically difficult but adequate echocardiographic study. 2. Mild left atrial enlargement.  Normal right ventricular size; mildly    impaired right ventricular systolic function. 3. Normal aortic valve with mild annular calcification. 4. Normal mitral valve with mild annular calcification and very mild    regurgitation. 5. Normal pulmonic and tricuspid valves; mild tricuspid regurgitation. 6. Mild left ventricular dilatation; delineation of left ventricular systolic    function was suboptimal due to technical issues -- intravenous    echocardiographic contrast was injected to assist with assessment of wall    motion.  Global hypokinesis was present with severe impairment in overall    systolic function.  Estimated ejection fraction was 0.25. Dictated by:   Jacqulyn Ducking, M.D. Attending Physician:  Maggie Font DD:  09/11/01 TD:  09/12/01 Job: [id number]

## 2010-08-07 NOTE — Assessment & Plan Note (Signed)
[place] CARDIOLOGY OFFICE NOTE   [name], [name]                  MRN:          [id number]  DATE:11/16/2005                            DOB:          January 22, 1946    PRIMARY CARE PHYSICIAN:  Previously was Barrie Folk. Berdine Addison, M.D.  I am not  certain who her primary is anymore.   [name] has had some challenges over the recent last few weeks.  Most  significantly, she had her defibrillator placed in the end of July 2007 for  a nonischemic dilated cardiomyopathy and recent pneumonia.  She has kind of  had sort of a challenging course with some pleuritic right-sided chest  discomfort, but over the course of a period of time she has actually done  slightly better.  She was seen initially on October 27, 2005 by me, and her  hospital course is detailed relatively well.  She then came into the  hospital on November 04, 2005 with worsening chest discomfort and was referred  to our [facility] and was seen by our PA there, was evaluated, and  felt to be doing okay, and followed up with me today, and she has gotten  steadily better over the course of the period of time that we have been  evaluating her.  She still has some left shoulder and chest discomfort.  The  left shoulder discomfort started last night, but otherwise she has been  doing fine.  I think she slept on her shoulder, the same side as where she  has had her pacemaker placed before.  No increase in her chest discomfort  with regard to the pleuritic component, and she is getting around reasonably  well now.  Her shoulder discomfort hurts mostly when she is moving her left  arm.  She raises her left arm much above the level of her shoulder.  She has  significant discomfort there.  I suspect she has probably been favoring that  for a while and probably has some musculoskeletal discomfort.  She is a  little tender to palpation over the shoulder.   She is on digoxin 250 mcg  once a day, Coreg 12.5 twice a day, metformin 1000 mg twice a day,  spironolactone 25 mg a day, glipizide 10 mg a day, furosemide 40 mg a day,  aspirin 81 mg a day, and Diovan 80 mg once a day.   PHYSICAL EXAMINATION:  VITAL SIGNS:  Today, her blood pressure is 120/60,  her pulse is 89.  CHEST:  Clear to auscultation, although she does not take a very deep breath  because it is discomforting to her.  CARDIOVASCULAR:  Distant but regular.  LOWER EXTREMITIES:  Without significant edema.  She is morbidly obese, so it  is difficult to assess.   So, at this point, we are going to follow her clinically and just see how  she does as she resolves this discomfort.  She has asked for some pain  medication, so I am going to give her some  Percocet.  I gave her 15 tablets.  She can take that at night  to help her go  to sleep.  Otherwise, I think she is actually doing reasonably well.  I will  see her back in 6 months for routine followup.                                  Jacqulynn Cadet. Wilhemina Cash, MD   JMH/MedQ  DD:  11/16/2005 DT:  11/17/2005 Job #:  [id number]

## 2010-08-07 NOTE — Op Note (Signed)
[name], [name]           ACCOUNT NO.:  [id number]   MEDICAL RECORD NO.:  [id number]          PATIENT TYPE:  INP   LOCATION:  2911                         FACILITY:  [facility]   PHYSICIAN:  Champ Mungo. [name], M.D.  DATE OF BIRTH:  1946/02/26   DATE OF PROCEDURE:  10/14/2005  DATE OF DISCHARGE:                                 OPERATIVE REPORT   PROCEDURE PERFORMED:  Urgent pericardiocentesis.   INTRODUCTION:  The patient is 65 year old woman with a history of  nonischemic cardiomyopathy, congestive heart failure, and morbid obesity.  She underwent prophylactic ICD implantation one week ago.  The patient  complained of minimal amounts of chest heaviness the day after surgery but  had no change in her pacing thresholds and her chest x-ray was normal.  She  was discharged home and came back to the hospital with near syncope.  She  was subsequently found to have changes in her pacing levels such that her R-  waves decreased and her threshold was markedly increased and she underwent  ICD lead revision.  It should be noted that the patient initially had a  small pericardial effusion.  Today, after her procedure, the patient had  decreased urine output, increased heart rate with a drop in blood pressure  and required dopamine.  She had a repeat 2-D echo which demonstrated marked  enlargement of her pericardial space with fluid.  She is now referred for  pericardiocentesis.  Of note, an hour prior to the procedure, the heart rate  increased from 100 to 130 beats per minute.  She required dopamine.   PROCEDURE:  After informed consent was obtained, the patient was taken to  the diagnostic catheterization lab in a fasting state.  Her precordial area  was prepped with Betadine.  30 mL of lidocaine was infiltrated in the left  subxiphoid space.  A pericardiocentesis needle was then utilized with  contrast to puncture the pericardial space.  Contrast was injected to  confirm pericardial  location of the needle.  A J-wire was then advanced  through the needle resulting in traversing of the pericardial space to the  posterior portion of the pericardium.  The dilator was advanced over the J-  wire and a total of 700 mL of  sanguinous fluid was removed from the  pericardial space.  This fluid was sent lab for H&H.  During this time, the  patient's heart rate decreased from 130 down to 90 and her blood pressure  increased from 100 to 130.  A drain was placed and the incision was secured  with a silk suture and an OpSite placed and she was returned to her room in  satisfactory condition.   COMPLICATIONS:  There were no immediate procedure complications.   RESULTS:  This demonstrates successful urgent pericardiocentesis in a  patient with pending cardiac tamponade status post ICD implantation.  A stat  2D echo was ordered to confirm drainage of the fusion.           ______________________________  Champ Mungo. [name], M.D.     GWT/MEDQ  D:  10/14/2005  T:  10/14/2005  Job:  [id number] 

## 2010-08-07 NOTE — Cardiovascular Report (Signed)
[place] NOTE   [name], [name]                  MRN:          [id number]  DATE:01/12/2006                            DOB:          October 08, 1945    REASON FOR VISIT:  Ms. Cardena returns today for follow up.   HISTORY:  The patient is a very pleasant middle-aged woman with a history of  a nonischemic cardiomyopathy, morbid obesity, hypertension, and diabetes  with class 2 heart failure.  She underwent implantable cardioverter-  defibrillator implantation in July [id number], which was complicated by the late  development of pericardial tamponade and perforation.  The patient also  underwent lead revision and pericardiocentesis with drainage of over 700 mL  of blood pericardial fluid.  Ultimately the patient's bleeding stopped and  she was discharged home.  She subsequently was found to have an abscess in  her axilla on the contralateral side from her implantable cardioverter-  defibrillator insertion; and, has overall been stable since this was  drained.  She denies any residual fevers or chills.  She denies nausea,  vomiting, diarrhea and constipation.   PHYSICAL EXAMINATION:  GENERAL APPEARANCE:  On physical examination she is a  pleasant, obese, middle-aged woman in no acute distress.  VITAL SIGNS:  The patient's blood pressure today is 120/70.  The pulse is 78  and regular.  Respirations  are 18.  The weight is not recorded.  NECK:  The neck reveals no jugular venous distention.  LUNGS:  The lungs are clear bilaterally to auscultation.  There are no  wheezes, rales or rhonchi.  HEART:  The cardiovascular exam reveals  a regular rate and rhythm with  normal S1 and S2.  EXTREMITIES: The extremities demonstrate no cyanosis, clubbing or edema.   PROCEDURE:  Interrogation of the defibrillator demonstrates a [employer] V-193  device with R waves of 8 and a pacing impedance of 370 ohms with  a threshold  of 0.75 and 0.5, and battery voltage is  greater than 3.2 volts.   IMPRESSION:  1. Nonischemic cardiomyopathy.  2. Congestive heart failure.  3. Status post implantable cardioverter-defibrillator insertion      complicated by pericardial effusion.   DISCUSSION:  Overall Ms. Devall is stable.  Her defibrillator is working  normally.  She has recovered from a pericardiocentesis.   We will plan to see her back for pacemaker/defibrillator check in four  months.  D            ______________________________  Champ Mungo. [name], MD     GWT/MedQ  DD:  01/12/2006  DT:  01/13/2006  Job #:  [id number]   cc:   Barrie Folk. Berdine Addison, MD
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
## 2010-08-07 NOTE — Assessment & Plan Note (Signed)
[facility]                         ELECTROPHYSIOLOGY OFFICE NOTE   SEMEKA, ELLESTAD                  MRN:          [id number]  DATE:07/27/2006                            DOB:          [birth year]    [name] returns today for followup. She is a very pleasant, middle-  aged, morbidly obese woman with a history of nonischemic cardiomyopathy  and congestive heart failure status post ICD insertion complicated  initially by pericardial effusion and then subsequently by the  development of lead dislodgement with multiple discharges followed by  reimplantation followed by another lead dislodgment status post removal  of all of her hardware. She returns today for followup. She overall has  been stable. She denied any intercurrent ICD therapies. She denies chest  pain.   PHYSICAL EXAMINATION:  GENERAL:  She is a pleasant, obese, middle-age  woman in no distress.  VITAL SIGNS:  Blood pressure 103/71, the pulse 70 and regular,  respirations were 18, the weight was 276 pounds, down from 280 at our  last visit.  CARDIOVASCULAR:  Regular rate and rhythm with normal S1 and S2.  EXTREMITIES:  Demonstrated no edema.  LUNGS:  Clear bilaterally to auscultation. No wheezes, rales or rhonchi  were present.   IMPRESSION:  1. Nonischemic cardiomyopathy.  2. Congestive heart failure.  3. Morbid obesity.  4. Status post ICD implant with multiple problems now having removed      her defibrillator.   DISCUSSION:  Overall [name] is stable. She will continue on her  present medical therapy which includes digoxin, Coreg, metformin,  aldactone, Glipizide, furosemide, aspirin, Diovan and she will followup  with [place] in 6 months.     Champ Mungo. [name], MD  Electronically Signed    GWT/MedQ  DD: 07/27/2006  DT: 07/27/2006  Job #: [id number]   cc:   Barrie Folk. Berdine Addison, MD

## 2010-08-07 NOTE — Assessment & Plan Note (Signed)
[name]                         ELECTROPHYSIOLOGY OFFICE NOTE   [name], [name]                  MRN:          [id number]  DATE:04/15/2006                            DOB:          1946/02/15    [name] returns today for followup.  She is a very pleasant middle-  aged woman with nonischemic cardiomyopathy, congestive heart failure,  hypertension, and is status post ICD insertion complicated initially by  development of pericardial effusion, status post pericardial window with  late development of ventricular lead dislodgment resulting in multiple  ICD shocks.  Her lead was revised again and she returns today for  followup.  She has done well.  She denies chest pain or shortness of  breath.  She has had no intercurrent ICD therapies.   PHYSICAL EXAMINATION:  GENERAL:  She is a pleasant, obese but well-  appearing, middle-aged woman in no acute distress.  VITAL SIGNS:  The blood pressure today was 130/90.  The pulse 70 and  regular.  The respirations were 18.  The weight was 283 pounds.  NECK:  Revealed no jugular venous distention.  LUNGS:  Clear bilaterally auscultation.  There are no wheezes, rales, or  rhonchi.  CARDIOVASCULAR:  Revealed a regular rate and rhythm with normal S1 and  S2.  EXTREMITIES:  Demonstrated trace peripheral edema.   EKG demonstrates a sinus rhythm with LVH.   Interrogation of her defibrillator demonstrates a [place] B-193 with R  waves of 7, a pacing impedance of 415 ohms, and a threshold of 1.25  volts at 0.8 msec.  __________  3.2 volts.   Today we reprogrammed her device to require 16 VF intervals and 24 VT  intervals before therapy would be initiated.   IMPRESSION:  1. Nonischemic cardiomyopathy.  2. Congestive heart failure.  3. Status post implantable cardioverter-defibrillator insertion with      multiple complications from this with the patient now stable.   We will plan to see her back in  4 months in the [facility].     Champ Mungo. [name], MD  Electronically Signed    GWT/MedQ  DD: 04/15/2006  DT: 04/15/2006  Job #: [id number]   cc:   Barrie Folk. Berdine Addison, MD

## 2010-08-07 NOTE — H&P (Signed)
[name], [name]           ACCOUNT NO.:  [id number]   MEDICAL RECORD NO.:  [id number]          PATIENT TYPE:  EMS   LOCATION:  MAJO                         FACILITY:  [facility]   PHYSICIAN:  Thomas C. Wall, M.D.   DATE OF BIRTH:  March 21, 1946   DATE OF ADMISSION:  10/11/2005  DATE OF DISCHARGE:                                HISTORY & PHYSICAL   CHIEF COMPLAINT:  Lightheadedness, nausea and nearly blacking out.   [name] is a pleasant 65 year old black female who had an episode  described above this morning while on the phone.  Her blood sugar had been  checked already and was 117.  She was talking on the phone and became  lightheaded and dizzy.  She then had some nausea and some chest pressure.  She called her daughter and lay on the bed.  She was brought to the [facility] emergency room.   Her history is significant for a nonischemic cardiomyopathy with an EF of 25-  30%.  She had a [employer] defibrillator with a single lead placed  on October 06, 2005.  She was readmitted on Saturday, July 21, with some  pleuritic chest pain.  A 2-D echocardiogram was done, which showed a small  posterior effusion, otherwise stable.  She was started on Protonix for some  reflux symptoms and sent home by Dr. [name].   She said she started the Protonix yesterday and already has felt better.  She does describe a lot of nausea and bringing up clear liquids.   When I walked in the room in the emergency room today, her blood pressure  was 72 systolic.  She had already been given a fluid bolus en route by  [facility] of 250 mL.  She was nauseated and requested to be sat up.  We sat  her up and she vomited some clear liquid into a basin.  We then lay her down  and her pressure came back up into the 90s.  She currently has a systolic  pressure of about 90.   Remarkably, she has taken none of her medications this morning, which  include Coreg and Diovan.  She has also not had her Lasix  this morning.   PAST MEDICAL HISTORY:  1.  In addition to the above, she has severe tricuspid regurgitation with      normal right ventricular function, however.  2.  She has morbid obesity.  3.  Hypertension.  4.  Type 2 diabetes.  5.  Class III heart failure.  6.  She has had a previous cholecystectomy.  7.  Ovarian cyst.  8.  History of stomach stapling procedure.  9.  Anemia.  10. Osteoarthritis.  11. Chronic bronchitis.   CURRENT MEDICATIONS:  1.  Digoxin 0.25 mg p.o. daily.  Digoxin level over the weekend was less      than 0.2.  2.  Coreg 12.5 mg b.i.d.  3.  Metformin 1000 mg b.i.d.  4.  Spironolactone 25 mg a day.  5.  Glipizide 10 mg a day.  6.  Lasix 40 mg a day.  7.  Diovan 160 mg daily.  8.  Protonix 40 mg a day.   SOCIAL HISTORY:  She denies any alcohol or tobacco use.  She lives at home  in [place].   FAMILY HISTORY:  Noncontributory.   REVIEW OF SYSTEMS:  Other than the HPI, is negative.  She specifically  denies any lack of appetite, abdominal pain, melena, hematochezia, fever,  chills, sweats, headache, hemoptysis.   PHYSICAL EXAMINATION:  VITAL SIGNS:  Her blood pressure is currently 90/60,  heart rate is about 90 and sinus rhythm.  She has had some ectopy.  Saturations are 95% plus.  She is afebrile.  Respiratory rate is 18 and  unlabored.  GENERAL:  She is really in no acute distress.  She is very pleasant.  She is  morbidly obese.  HEENT:  Sclerae slightly muddy.  PERRLA, extraocular movements intact.  Facial symmetry is normal.  Dentition is in poor condition with absence of a  lot of front teeth.  NECK:  Carotid upstrokes are equal bilaterally.  There is no obvious JVD.  The thyroid is not enlarged.  LUNGS:  Relatively clear from the anterior perspective.  CARDIAC:  Soft S1, S2.  There is no obvious rub.  ABDOMEN:  Protuberant with good bowel sounds.  There is no discrete  tenderness.  EXTREMITIES:  No cyanosis, clubbing or edema.  Pulses  were present.   Electrocardiogram shows no acute changes.  Chest x-ray is pending.   Laboratory data is pending.   ASSESSMENT:  Vasovagal event without pure syncope.  I find no indication her  defibrillator fired.  This could be from gastroesophageal reflux triggering  her vasovagal event or possibly some irritation of her pericardium.  There  is no sign on her EKG of any pericarditis.  There is no rub.  She did have a  very small posterior effusion, and this could be a microperforation.   I discussed this with Dr. Cristopher Peru.  We have developed the following  plan:   1.  A 2-D echocardiogram to rule out any progression in her effusion.  2.  Decrease IV fluids so as to avoid volume overload.  3.  Hold medicines until her blood pressure is above 100.  4.  Observation on telemetry.  5.  Continue Protonix intravenously at present.      Thomas C. Wall, M.D.  Electronically Signed     TCW/MEDQ  D:  10/11/2005  T:  10/11/2005  Job:  [id number]   cc:   Champ Mungo. [name], M.D.  Scarlett Presto, M.D.  Barrie Folk. Berdine Addison, MD
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
## 2010-08-07 NOTE — Procedures (Signed)
[facility]  Patient:    [name], [name] Visit Number: [id number] MRN: [id number]          Service Type: OUT Location: RAD Attending Physician:  Maggie Font Dictated by:   Sinda Du, M.D. Proc. Date: 08/21/01 Admit Date:  08/17/2001 Discharge Date: 08/17/2001                      Pulmonary Function Test Inter.  RESULTS: 1. Spirometry shows moderate to severe ventilatory defect with a pattern    suggestive of restrictive disease. Apparently there were problems with the    equipment so lung volumes and diffusing capacity could not be done. 2. Arterial blood gases shows relative resting hypoxemia. Dictated by:   Sinda Du, M.D. Attending Physician:  Maggie Font DD:  08/21/01 TD:  08/22/01 Job: [id number]

## 2010-08-07 NOTE — Op Note (Signed)
   NAMECLAYRE, [name]                       ACCOUNT NO.:  [id number]   MEDICAL RECORD NO.:  [id number]                    PATIENT TYPE:   LOCATION:                                       FACILITY:   PHYSICIAN:  Vernon Prey. Tamala Julian, M.D.                DATE OF BIRTH:   DATE OF PROCEDURE:  12/22/2001  DATE OF DISCHARGE:                                 OPERATIVE REPORT   PREOPERATIVE DIAGNOSES:  1. Left lateral knee mass.  2. Left lateral thigh mass.   POSTOPERATIVE DIAGNOSES:  1. A 4 cm mass left lateral knee, pathology pending.  2. 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   PROCEDURE:  1. Wide excision of mass left lateral knee.  2. Excision of fibrolipoma left lateral thigh.   SURGEON:  Vernon Prey. Tamala Julian, M.D.   DESCRIPTION OF PROCEDURE:  Under general anesthesia the patient's left thigh  and knee were prepped and draped in a sterile field.  Initial dissection was  carried out at the knee level.  A circumferential incision was made around  the mass.  The mass was very firm and hard.  It was fully excised.  Frozen  section was obtained and the pathologist felt that the mass might represent  metastatic neoplasm but wished to wait for final diagnosis.  It was felt  that the lesion was completely excised clinically and that until the final  diagnosis was obtained that no further dissection needed to be done.  Deep  tissues were closed with 2-0 Biosyn and the skin was closed with staples.   Next, a longitudinal incision was made over the mass of the upper thigh.  Incision was extended down to the deep subcutaneous tissue.  The patient was  a very large woman who weighed over 400 pounds, but the mass extended down  to the tensor fascia lata.  Wide excision of the mass was carried out and it  appeared to be a fibrolipoma.  Frozen section was not obtained.  The deep  tissues were closed with 2-0 Biosyn, the subcutaneous tissue with 3-0 Biosyn  and the skin with staples.  Dressing was  placed over both incisions.  She  was awakened from anesthesia uneventfully, transferred to a bed, and taken  to the postanesthetic care unit.                                               Vernon Prey. Tamala Julian, M.D.    LCS/MEDQ  D:  01/20/2002  T:  01/22/2002  Job:  [id number]

## 2010-08-07 NOTE — Procedures (Signed)
[name], [name] NO.:  [id number]   MEDICAL RECORD NO.:  [id number]          PATIENT TYPE:  OUT   LOCATION:  RAD                           FACILITY:  [facility]   PHYSICIAN:  Scarlett Presto, M.D.   DATE OF BIRTH:  1945-08-07   DATE OF PROCEDURE:  DATE OF DISCHARGE:  03/28/2005                                  ECHOCARDIOGRAM   TAPE NUMBER:  [id number]   TAPE COUNT:  [id number]   This is an evaluation for LV systolic function. The last echocardiogram was  done in June 2003. Technical quality of this study is adequate.   M-MODE TRACINGS:  The aorta is 32 mm.   Left atrium is 43 mm.   Septum is 12 mm.   Posterior wall is 14 mm.   Left ventricular diastolic dimension 56 mm.   Left ventricular systolic dimension 52 mm.   2-D AND DOPPLER IMAGING:  The left ventricle is mildly dilated. The ejection  fraction is markedly depressed at 20-25%. There is anterior, anteroseptal,  septal and inferoseptal akinesis. The apex is also akinetic. There is mild  concentric left ventricular hypertrophy.   The right ventricle is normal size and there appears to be just mildly  depressed RV systolic function.   Both atria are dilated.   The aortic valve is sclerotic with no stenosis or regurgitation.   The mitral valve has moderate annular calcification with no stenosis or  regurgitation.   There is mild tricuspid regurgitation.   There is no pericardial effusion.   ASSESSMENT:  No significant change from previous echo.      Scarlett Presto, M.D.  Electronically Signed     JH/MEDQ  D:  03/31/2005  T:  03/31/2005  Job:  [id number]   cc:   Dr. Berdine Addison

## 2010-08-07 NOTE — H&P (Signed)
NAMECATHALEYA, [name]           ACCOUNT NO.:  [id number]   MEDICAL RECORD NO.:  [id number]            PATIENT TYPE:   LOCATION:                                 FACILITY:   PHYSICIAN:  Champ Mungo. [name], MD         DATE OF BIRTH:   DATE OF ADMISSION:  02/21/2006  DATE OF DISCHARGE:                              HISTORY & PHYSICAL   ADMITTING DIAGNOSIS:  ICD lead dislodgement resulting in multiple ICD  shocks.   CHIEF COMPLAINT:  I have been shocked many times.   HISTORY OF PRESENT ILLNESS:  The patient is a 65 year old woman with a  nonischemic cardiomyopathy, and an ejection fraction of 25-30%.  She had  ICD implantation initially back in July which is complicated by  microperforation resulting in the slow development of a large  pericardial effusion for which she ultimately underwent  pericardiocentesis secondary to developing tamponade with over 700 mL of  blood of serosanguineous fluid removed from her chest.  The patient  prior to this had had her ICD lead revised and placed on the RV septum.  I saw her back in the office several weeks ago; and she was doing well.  The patient was at church yesterday; and admits to have gotten a little  bit more excited than usual raising her arms back-and-forth up into the  air.  This morning she was in her usual state of health when she began  to receive multiple ICD shocks.  She subsequently presented to [facility] and was transferred here for additional evaluation.  She  denies chest pain or shortness of breath.  She does feel quite anxious  secondary to the pain around her multiple ICD shocks.  She has had no  syncope; and denies palpitations.  Additional past medical history is  notable for hypertension, diabetes, and morbid obesity.   SOCIAL HISTORY:  The patient denies tobacco or ethanol abuse.   FAMILY HISTORY:  Negative for premature coronary disease.   REVIEW OF SYSTEMS:  As noted in the HPI, otherwise her systems  reviewed  and found to be negative.   PHYSICAL EXAMINATION:  GENERAL:  She is a pleasant obese middle-aged  woman in no acute distress.  VITAL SIGNS:  Blood pressure was 140/78.  The pulse was 95 and regular.  The respirations were 18.  The weight was 300 pounds.  HEENT EXAM:  Normal.  NECK:  Revealed no jugular distension.  There is no thyromegaly.  Trachea was midline.  The carotids were 2+ and symmetric.  CARDIOVASCULAR EXAM:  Distant with a regular rate and rhythm with a  normal S1 and S2.  LUNGS:  Clear bilaterally to auscultation.  There are no wheezes, rales,  or rhonchi.  ABDOMINAL EXAM:  Soft, nontender, nondistended.  There is no  organomegaly.  The bowel sounds are present, there is no rebound or  guarding.  EXTREMITIES:  Demonstrate no cyanosis, clubbing or edema.  The ICD  insertion site was healed nicely.   A chest x-ray of [facility] demonstrates clear dislodgement of  the defibrillation lead with  the tip of the lead at the junction of the  right atrium and the right ventricle; and the proximal coil back along  in the left pectoral region.  Interrogation demonstrates R waves of less  than 2. Her device has been turned off for ICD therapies.   IMPRESSION:  1. ICD lead dislodgement (late after implant).  2. Multiple ICD shocks secondary to #1.  The mechanism for her shocks      is that she is double counting both atrial electrograms and      ventricular electrograms; resulting in double counting, resulting      in multiple shocks.  3. Dilated cardiomyopathy with congestive heart failure.  4. Obesity.  5. Hypertension.   DISCUSSION:  I have discussed the treatment options with the patient.  ICD lead revision is going to be required with its own risks associated.  This was scheduled as early as possible in the immediate time.      Champ Mungo. [name], MD  Electronically Signed     GWT/MEDQ  D:  02/21/2006  T:  02/22/2006  Job:  [id number]
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
## 2010-08-07 NOTE — Op Note (Signed)
[name], [name] NO.:  [id number]   MEDICAL RECORD NO.:  [id number]          PATIENT TYPE:  INP   LOCATION:  2807                         FACILITY:  [facility]   PHYSICIAN:  Champ Mungo. [name], M.D.  DATE OF BIRTH:  Aug 17, 1945   DATE OF PROCEDURE:  10/06/2005  DATE OF DISCHARGE:                                 OPERATIVE REPORT   PROCEDURE PERFORMED:  Implantation of a single chamber ICD.   INDICATIONS:  Nonischemic cardiomyopathy, class II heart failure, EF 20%  (long-standing).   INTRODUCTION:  The patient is a 65 year old woman who was referred for ICD  implantation.  She has long-standing congestive heart failure and a  nonischemic cardiomyopathy with an EF of 20%.  She is now referred for  prophylactic ICD implantation.   PROCEDURE PERFORMED:  After informed consent was obtained, the patient was  taken to the diagnostic EP lab in a fasting state.  After the usual  preparation and draping, intravenous fentanyl and midazolam was given for  sedation.  30 mL lidocaine was infiltrated into the left infraclavicular  region.  An 8-cm incision was carried out over this region and  electrocautery utilized to dissect down to the fascial plane.  10 mL of  contrast was injected into the left upper extremity venous system  demonstrating a patent left subclavian vein.  It was subsequently punctured  and the [place], model 7001, active fixation defibrillation lead,  serial number [id number], was advanced into the right ventricle and mapping  was carried out.  At the final site on the RV septum near the apex, the R-  waves were 7 mV through the device, the pacing impedance was 696 ohms at 5  volts pacing and threshold 0.6 volts at 0.5 milliseconds.  10 volt pacing  with the lead actively fixed did not stimulate the diaphragm.  With these  satisfactory parameters, the lead was secured to the subpectoralis fascia  with a silk suture.  The sewing sleeve was also secured  with a silk suture.  Electrocautery was utilized to make a subcutaneous pocket.  Kanamycin  irrigation was utilized to irrigate the pocket.  Electrocautery was utilized  to assure hemostasis.  The St. Jude Atlas Plus VR model V-193 single chamber  defibrillator, serial number [id number], was connected to the defibrillation  lead and placed in the subcutaneous pocket.  The generator was secured with  a silk suture.  Additional kanamycin was then utilized to irrigate the  pocket.  The patient was more deeply sedated and defibrillation threshold  testing carried out.   After the patient was deeply sedated with fentanyl and Versed, VF was  induced with a T-wave shock and a 15 joule shock was delivered terminating  ventricle fibrillation.  5 minutes was allowed to elapse and a second DFT  test carried out.  Again, VF was induced with a T-wave shock and a 15 joule  shock was delivered which terminated VF and restored sinus rhythm.  At this  point, no additional defibrillation threshold testing was carried out and  the incision was closed with a layer of 2-0 Vicryl  followed by a layer of 3-  0 Vicryl followed by a layer of 4-0 Vicryl.  Benzoin was painted on the  skin, Steri-Strips were applied, and a pressure dressing was placed and the  patient was returned to her room in satisfactory condition.   COMPLICATIONS:  There were no immediate procedure complications.   RESULTS:  This demonstrates successful implantation of a St. Jude single  chamber defibrillator in a patient with nonischemic cardiomyopathy, class II  congestive heart failure, EF of 20%, despite maximal medical therapy.           ______________________________  Champ Mungo. [name], M.D.     GWT/MEDQ  D:  10/06/2005  T:  10/06/2005  Job:  [id number]   cc:   Barrie Folk. Berdine Addison, MD  Fax: [phone]

## 2010-08-07 NOTE — Op Note (Signed)
[name], MCPETERS           ACCOUNT NO.:  [id number]   MEDICAL RECORD NO.:  [id number]          PATIENT TYPE:  INP   LOCATION:  2911                         FACILITY:  [facility]   PHYSICIAN:  Champ Mungo. [name], M.D.  DATE OF BIRTH:  January 02, 1946   DATE OF PROCEDURE:  10/14/2005  DATE OF DISCHARGE:                                 OPERATIVE REPORT   PROCEDURE PERFORMED:  ICD lead revision.   INDICATION:  ICD lead dislodgement/microperforation with pericardial  effusion resulting in very poor pacing and sensing thresholds.   INTRODUCTION:  The patient is 65 year old woman who presented to the  hospital 1 week ago for prophylactic ICD implantation.  On day after her  surgery she was discharged but noted some chest heaviness.  Her pacing and  sensing numbers at that time were good.  She represented to hospital 2 days  later with epigastric pleuritic chest pain but was otherwise stable and her  symptoms resolved.  A 2-D echo was done demonstrating a very small  pericardial effusion.  She represented a day later with fatigue and near  syncope and nausea.  She had a CT scan which demonstrated a moderate  pericardial effusion.  Subsequent interrogation of her defibrillator  demonstrated that her R-waves had decreased markedly from greater than 12 to  less than 2 and her threshold was greater than 7 at 1 volt.  In addition,  her pace impedance had gone from the 500 range down to the 200 range.  With  all the above, the diagnosis of ICD lead dislodgement/microperforation was  made.  She presents now for lead revision.  Of note, the patient has  required dopamine to maintain a blood pressure above 100.   PROCEDURE:  After informed consent was obtained, the patient was taken to  the diagnostic EP lab in fasting state.  After usual preparation and  draping, intravenous fentanyl and Midazolam was given for sedation.  A 7 cm  incision was carried out over this region and electrocautery utilized  to  dissect down to the fascial plane.  The pocket was inspected.  There was  approximately 20 mL of serosanguineous fluid in the pocket which was  evacuated.  The pocket was irrigated.  The defibrillator lead was free from  its silk suture.  Fluoroscopy demonstrated that the tip of the lead was  moving freely in the right ventricular chamber.  The helix was retracted  into the lead body and mapping was subsequently carried out.  The lead was  replaced on the RV septum where R-waves measured greater than 20 mV and the  pace impedance once the lead was actively fixed on the septum was 920 ohms.  10 volt pacing did not dilate the diaphragm.  The threshold was initially  1.5 at 0.5.  With these satisfactory parameters, the lead was secured to  subpectoralis fascia with a figure-of-eight silk suture.  The sewing sleeve  was also secured with silk suture.  Kanamycin irrigation utilized to  irrigate the pocket.  Electrocautery utilized to assure hemostasis.  The  previously implanted St. Jude Atlas Plus VR single chamber defibrillator was  connected to the defibrillation lead and placed in the subcutaneous pocket.  Generator was secured with silk suture.  Additional kanamycin was utilized  to irrigate the pocket and the incision was closed with layer of 2-0 Vicryl  followed by layer of 3-0 Vicryl followed by layer of 4-0 Vicryl.  It should  be noted that defibrillation threshold testing was not carried out although  the device was interrogated through the interrogator and found to have  satisfactory parameters.  The defibrillation threshold testing was not  carried out secondary to the patient's borderline hypotension in the setting  of requirement for positive inotropes.  There is concern that additional  fentanyl and Versed with drop the blood pressure to a dangerous level.  At  this point benzoin and Steri-Strips were placed on the incision and pressure  dressing was placed and the patient  returned to her room in satisfactory  condition.   COMPLICATIONS:  There were no immediate procedure complications.   RESULTS:  This demonstrates successful ICD lead revision in a patient with  ICD lead dislodgement/microperforation.           ______________________________  Champ Mungo. [name], M.D.     GWT/MEDQ  D:  10/14/2005  T:  10/14/2005  Job:  [id number]

## 2010-08-07 NOTE — Assessment & Plan Note (Signed)
[facility]                                   ON-CALL NOTE   SUBRINA, ASPINALL                    MRN:          [id number]  DATE:10/09/2005                            DOB:          1945/07/02    HISTORY:  Mrs. Sastry is a 65 year old female who was recently discharged  from the hospital on July 19 after a [facility] defibrillator was  inserted secondary to her nonischemic cardiomyopathy.  She stated at the  time of discharge she was having some shoulder discomfort, but over the last  several days, and particularly last night and this morning, her discomfort  has worsened to the point she cannot take a deep breath, it radiates  straight through to her back, and she is very uncomfortable and would like  to be seen.  I explained to her that the only way for [place] to evaluate her  would be for her to proceed to [facility].  I instructed her  if she did not feel comfortable having a family member drive her to the  emergency room, then she should call 911.  I have already notified the  emergency room to notify us of her arrival.  I have asked her to bring all  her medications, and she is agreeable to this plan.                                   Sharyl Nimrod, PA-C   EW/MedQ  DD:  10/09/2005  DT:  10/09/2005  Job #:  [id number]

## 2010-08-07 NOTE — Discharge Summary (Signed)
[name], TOLENTO NO.:  [id number]   MEDICAL RECORD NO.:  [id number]          PATIENT TYPE:  INP   LOCATION:  [id number]                         FACILITY:  [facility]   PHYSICIAN:  Sueanne Margarita, P.A. DATE OF BIRTH:  December 04, 1945   DATE OF ADMISSION:  10/06/2005  DATE OF DISCHARGE:  10/07/2005                                 DISCHARGE SUMMARY   PRINCIPAL DIAGNOSES:  1.  Discharge date one status post implantation St. Jude ATLAS cardioverter      defibrillator.  2.  Nonischemic cardiomyopathy.  (A) Ejection fraction 25 to 30%.  (B) Class III congestive heart failure.   SECONDARY DIAGNOSES:  1.  Hypertension.  2.  Morbid obesity.  3.  Type 2 diabetes.  4.  Status post tubal ligation.  5.  History of stomach stapling.  6.  Total vaginal hysterectomy.  7.  Status post cholecystectomy.  8.  Removal of ovarian cyst.   PROCEDURE:  October 06, 2005 implantation of [employer]. Jude ATLAS cardioverter  defibrillator, Dr. Cristopher Peru.   BRIEF HISTORY:  Ms. Lauinger is a 65 year old female.  She has a history of  nonischemic dilated cardiomyopathy diagnosed about one year ago.  Her  ejection fraction varies between 25 and 30 percent.  She has a history of  palpitations.  She has never had syncope.  She denies chest pain.  She has  dyspnea with exertion although her activity is predominantly limited by  obesity and arthritis in knees.   Her heart failure has been improved with beta blocker, ACE inhibitors, and  diuretics.   HOSPITAL COURSE:  Patient presented electively on October 06, 2005.  She  underwent implantation of cardioverter defibrillator, as mentioned above,  without any complications.  She had mild feeling of heaviness in the chest  which was relieved with intravenous Lasix and nebulizer therapy.  The  incision shows that it is healing nicely with a small hematoma.  Her post  procedure chest x-ray shows that the cardioverter defibrillator lead in the  right ventricle  is in appropriate position and that she has no pneumothorax.  Mobility of the left arm in the post procedure period has been discussed  with the patient.  She discharges October 07, 2005 on the following  medications:   1.  Digoxin 0.25 mg daily.  2.  Coreg 12.5 mg b.i.d.  3.  Metformin 1000 mg b.i.d.  4.  Spironolactone 25 mg daily.  5.  Glipizide 10 mg daily.  6.  Furosemide 40 mg daily.  7.  Diovan 160 mg daily.   She is also to continue her iron and multivitamin.  She is asked not to  drive for the next week, not to lift anything heavier than 10 pounds for the  next four weeks.  She is to keep her incision dry for the next seven days,  just sponge bathe until Wednesday, October 13, 2005.  She has follow up at  [facility] [address], [place] ICD clinic on  Wednesday, October 27, 2005 at 10:10 in the morning.  She will see Dr. Cristopher Peru at the [facility]  office [place] on Wednesday, January 12, 2006 at 3:00.   LABORATORY STUDIES:  This admission.  The complete blood count white cells  3.8, hemoglobin 10.9, hematocrit 33, and platelets are 161, PTT was 26, INR  1.2, and sodium was 142, potassium 4, chloride 105, carbonate 29, glucose  91, BUN is 17, creatinine 7.9.      Sueanne Margarita, P.A.     GM/MEDQ  D:  10/07/2005  T:  10/07/2005  Job:  [id number]   cc:   Scarlett Presto, M.D.  Fax: [place] Berdine Addison, MD  Fax: [phone]

## 2010-08-07 NOTE — H&P (Signed)
NAME:  [name], [name]                     ACCOUNT NO.:  [id number]   MEDICAL RECORD NO.:  [id number]                   PATIENT TYPE:  AMB   LOCATION:  DAY                                  FACILITY:  [facility]   PHYSICIAN:  Leane Para C. Tamala Julian, M.D.                DATE OF BIRTH:  21-Feb-1946   DATE OF ADMISSION:  DATE OF DISCHARGE:                                HISTORY & PHYSICAL   HISTORY OF PRESENT ILLNESS:  [age]-year-old female with history of  masses of the left knee and left hip.  The masses are increasing in size.  It initially was felt that she had lipomas, but the knee mass has become  more tender.  She wishes to have them excised.  She has cardiomyopathy but  she is very concerned about the masses and it is felt that they can be  excised under anesthesia.   PAST HISTORY:  She has cardiomyopathy, hypertension, osteoarthritis and a  history of paroxysmal supraventricular tachycardia; she also has  gastroesophageal reflux disease.  The patient had a recent bout of  bronchitis that was treated with Biaxin and Histussin and it has resolved.   MEDICATIONS:  1. Darvocet-N 100 q.4h. p.r.n.  2. Altace 5 mg every day.  3. Lasix 20 mg every day.  4. Warfarin 2.5 mg every day.  5. Verapamil 80 mg t.i.d.  6. Potassium chloride 20 mEq every day.  7. Lanoxin 0.25 mg every day.  8. Nexium 40 mg every day.  9. She is on Coumadin, which has been discontinued.  Her last PT was 13.5.   PAST SURGICAL HISTORY:  Status post gastroplasty for morbid obesity without  success.  She previously weighed over 500 pounds in the early 1980s.  She  had a pelvic laparotomy with excision of a large ovarian mass in 1998.  She  underwent cholecystectomy in 1999.   PHYSICAL EXAMINATION:  VITAL SIGNS:  Blood pressure 136/98, pulse 68,  respirations 20.  Weight could not be obtained in the office.  Oxygen  saturation is 97% on room air.  GENERAL:  She is a morbidly obese female who is in no acute  distress.  HEENT:  Unremarkable.  NECK:  Neck is supple.  No adenopathy, thyromegaly or jugular venous  distention.  CHEST:  Clear.  No rales, rhonchi or wheezes.  HEART:  Regular rate and rhythm without murmur, gallop or rub.  ABDOMEN:  Abdomen obese, soft, nontender.  Well-healed incisions.  EXTREMITIES:  A 2.5-cm mass, left lateral leg above the knee posteriorly,  and a 3-cm mass, upper lateral thigh on the left side over the vastus  lateralis.  She has no peripheral edema.  NEUROLOGIC:  No focal motor, sensory or cerebellar deficit.   IMPRESSION:  1. Soft tissue masses, left thigh and lower leg.  2. Hypertension.  3. Stable cardiomyopathy.  4. History of paroxysmal supraventricular tachycardia.  5. Osteoarthritis.  6. History of superficial  phlebitis.   PLAN:  Excision of mass under anesthesia.                                               Vernon Prey. Tamala Julian, M.D.    LCS/MEDQ  D:  12/21/2001  T:  12/22/2001  Job:  [id number]

## 2010-08-07 NOTE — Discharge Summary (Signed)
[name], [name]           ACCOUNT NO.:  [id number]   MEDICAL RECORD NO.:  [id number]          PATIENT TYPE:  OBV   LOCATION:  [id number]                         FACILITY:  [facility]   PHYSICIAN:  Champ Mungo. [name], M.D.  DATE OF BIRTH:  May 31, 1945   DATE OF ADMISSION:  10/09/2005  DATE OF DISCHARGE:  10/10/2005                           DISCHARGE SUMMARY - REFERRING   SUMMARY OF HISTORY:  [name] is a 65 year old female who was recently  discharged from the hospital after an ICD was placed.  Since her discharge  she has continued to have pleuritic chest discomfort which has gradually  worsens, particularly over the preceding 24 hours.  Dr. [name] admitted her  for further observation.   Her history is notable for dilated cardiomyopathy, CHF, status post St. Jude  Atlas defibrillator on October 06, 2005, EF 25% to 30%, type 2 diabetes,  hypertension, and obesity.   LABORATORY:  Admission weight was 293.2, H&H 12.2 and 36.0, MCV was slightly  low at 77.8, platelets 139,00, WBC 5.0.  PTT 26, PT 16.1.  Sodium 138,  potassium 3.8, BUN 19, creatinine 0.9, glucose 112.  CK-MBs and relative  index negative.  Troponins were slightly elevated 0.17 and 0.24.  Digoxin  less than 0.2.  Urinalysis was unremarkable.  Chest x-ray on the 21st showed  cardiomegaly with vascular congestion, low lung volumes, with bibasilar  atelectasis, stable compared to March 28, 2005.   HOSPITAL COURSE:  [name] was admitted overnight for further  observation.  She continued to have mild pleuritic midepigastric heaviness  that felt much better on the morning of the 22nd after being assessed  despite slightly abnormal troponins. Dr. [name] felt that she could be  discharged home. He did a quick look echocardiogram and this demonstrated a  small posterior effusion.   DISCHARGE DIAGNOSES:  1.  Mild epigastric, pleuritic discomfort of uncertain etiology, probable      musculoskeletal.  2.  Status post  implantable cardiac defibrillator insertion.  3.  History of dilated cardiomyopathy/congestive heart failure with a known      ejection fraction of approximately 25%.  4.  Morbid obesity.   DISPOSITION:  She is discharged home.  She is asked to continue her home  medications.  These include digoxin 0.25 mg daily, Coreg 12.5 b.i.d.,  metformin 1000 mg b.i.d., spironolactone 25 mg daily, glipizide 10 mg daily,  furosemide 40 mg daily, Diovan 160 daily, and iron and multivitamins as  previously.  She was asked to continue weighing daily to maintain low salt,  fat cholesterol ADA diet.  She was asked to keep her appointments that were  scheduled during her last discharge.  This includes pacer clinic follow-up  on October 27, 2005, at 10:10 in the morning, Dr. Cristopher Peru on October 24th  at 3:00 p.m.[name]  She is asked to continue her instructions post ICD insertion  as directed.      Sharyl Nimrod, P.A. [facility]    ______________________________  Champ Mungo. [name], M.D.    EW/MEDQ  D:  10/10/2005  T:  10/10/2005  Job:  [id number]   cc:   Carleene Overlie  Peyton Najjar, M.D.  [address] [place]  [place] [address]

## 2010-08-24 ENCOUNTER — Other Ambulatory Visit: Payer: Self-pay | Admitting: *Deleted

## 2010-08-24 MED ORDER — CARVEDILOL 25 MG PO TABS: 25.0000 mg | ORAL_TABLET | Freq: Two times a day (BID) | ORAL | Status: DC

## 2010-12-10 ENCOUNTER — Other Ambulatory Visit (HOSPITAL_COMMUNITY): Payer: Self-pay | Admitting: Family Medicine

## 2010-12-10 DIAGNOSIS — R52 Pain, unspecified: Secondary | ICD-10-CM

## 2010-12-10 LAB — CULTURE, ROUTINE-ABSCESS

## 2010-12-21 LAB — CBC
MCV: 74.8 — ABNORMAL LOW
Platelets: 162
RDW: 17 — ABNORMAL HIGH
WBC: 4

## 2010-12-21 LAB — BASIC METABOLIC PANEL
BUN: 18
Chloride: 106
Creatinine, Ser: 0.95

## 2010-12-21 LAB — GLUCOSE, CAPILLARY: Glucose-Capillary: 77

## 2010-12-23 LAB — COMPREHENSIVE METABOLIC PANEL
ALT: 17
AST: 20
Albumin: 2.9 — ABNORMAL LOW
Alkaline Phosphatase: 64
Calcium: 9.4
GFR calc Af Amer: >60
Potassium: 3.6
Sodium: 136
Total Protein: 6.6

## 2010-12-23 LAB — GLUCOSE, CAPILLARY
Glucose-Capillary: 100 — ABNORMAL HIGH
Glucose-Capillary: 124 — ABNORMAL HIGH
Glucose-Capillary: 139 — ABNORMAL HIGH
Glucose-Capillary: 150 — ABNORMAL HIGH

## 2010-12-23 LAB — POCT CARDIAC MARKERS
CKMB, poc: <1 — ABNORMAL LOW
Myoglobin, poc: 135
Myoglobin, poc: 86.3

## 2010-12-23 LAB — TSH: TSH: 2.57

## 2010-12-23 LAB — URINALYSIS, ROUTINE W REFLEX MICROSCOPIC
Bilirubin Urine: NEGATIVE
Glucose, UA: NEGATIVE
Hgb urine dipstick: NEGATIVE
Specific Gravity, Urine: 1.026
Urobilinogen, UA: 0.2
pH: 5.5

## 2010-12-23 LAB — DIFFERENTIAL
Basophils Absolute: 0
Basophils Relative: 0
Eosinophils Absolute: 0
Eosinophils Relative: 1
Lymphocytes Relative: 17
Lymphs Abs: 0.8
Monocytes Absolute: 0.2
Monocytes Relative: 4
Neutro Abs: 3.8

## 2010-12-23 LAB — CBC
HCT: 28.7 — ABNORMAL LOW
HCT: 31.9 — ABNORMAL LOW
Hemoglobin: 10.2 — ABNORMAL LOW
Hemoglobin: 9.3 — ABNORMAL LOW
MCHC: 32
MCV: 76 — ABNORMAL LOW
RBC: 3.78 — ABNORMAL LOW
RBC: 4.18
RDW: 17.4 — ABNORMAL HIGH
WBC: 4.2

## 2010-12-23 LAB — FERRITIN: Ferritin: 66 (ref 10–291)

## 2010-12-23 LAB — CK TOTAL AND CKMB (NOT AT ARMC)
CK, MB: 1.1
CK, MB: 1.4
Relative Index: 1.4
Relative Index: 1.4
Relative Index: INVALID
Total CK: 101
Total CK: 109
Total CK: 81

## 2010-12-23 LAB — POCT I-STAT, CHEM 8
BUN: 24 — ABNORMAL HIGH
Calcium, Ion: 1.21
Chloride: 106
Glucose, Bld: 112 — ABNORMAL HIGH
HCT: 30 — ABNORMAL LOW
TCO2: 26

## 2010-12-23 LAB — URINE MICROSCOPIC-ADD ON

## 2010-12-23 LAB — OCCULT BLOOD X 1 CARD TO LAB, STOOL: Fecal Occult Bld: NEGATIVE

## 2010-12-23 LAB — TROPONIN I
Troponin I: 0.01
Troponin I: 0.03

## 2010-12-23 LAB — HEMOGLOBIN A1C: Mean Plasma Glucose: 128

## 2010-12-23 LAB — IRON AND TIBC: TIBC: 284

## 2010-12-30 ENCOUNTER — Ambulatory Visit (HOSPITAL_COMMUNITY)
Admission: RE | Admit: 2010-12-30 | Discharge: 2010-12-30 | Disposition: A | Discharge status: Home / Self Care | Payer: PRIVATE HEALTH INSURANCE | Source: Ambulatory Visit | Attending: Family Medicine | Admitting: Family Medicine

## 2010-12-30 ENCOUNTER — Ambulatory Visit (HOSPITAL_COMMUNITY): Admission: RE | Admit: 2010-12-30 | Payer: PRIVATE HEALTH INSURANCE | Source: Ambulatory Visit

## 2010-12-30 DIAGNOSIS — R52 Pain, unspecified: Secondary | ICD-10-CM

## 2011-01-05 LAB — DIFFERENTIAL
Basophils Absolute: 0
Basophils Relative: 0
Lymphocytes Relative: 23
Monocytes Absolute: 0.2
Neutro Abs: 2.9
Neutrophils Relative %: 71

## 2011-01-05 LAB — I-STAT 8, (EC8 V) (CONVERTED LAB)
BUN: 20
Chloride: 107
HCT: 33 — ABNORMAL LOW
Potassium: 4.2
pCO2, Ven: 32 — ABNORMAL LOW
pH, Ven: 7.469 — ABNORMAL HIGH

## 2011-01-05 LAB — CBC
MCHC: 32.1
RBC: 4.23
RDW: 17.3 — ABNORMAL HIGH

## 2011-01-05 LAB — POCT CARDIAC MARKERS
CKMB, poc: 1.2
CKMB, poc: 1.4
Myoglobin, poc: 74.5
Myoglobin, poc: 98.5
Operator id: 291361
Operator id: 291361

## 2011-01-05 LAB — POCT I-STAT CREATININE: Creatinine, Ser: 1.2

## 2011-01-05 LAB — DIGOXIN LEVEL: Digoxin Level: 1.1

## 2011-01-07 LAB — BASIC METABOLIC PANEL
CO2: 25
Calcium: 10.1
Creatinine, Ser: 1.12
GFR calc Af Amer: >60
GFR calc non Af Amer: 50 — ABNORMAL LOW
Glucose, Bld: 210 — ABNORMAL HIGH

## 2011-01-07 LAB — POCT CARDIAC MARKERS
CKMB, poc: 1.5
Myoglobin, poc: 120
Troponin i, poc: <0.05

## 2011-01-07 LAB — DIFFERENTIAL
Basophils Absolute: 0
Basophils Relative: 0
Lymphocytes Relative: 22
Neutro Abs: 3.2
Neutrophils Relative %: 71

## 2011-01-07 LAB — CBC
MCHC: 33.1
RBC: 4.28
RDW: 17.5 — ABNORMAL HIGH

## 2011-01-13 ENCOUNTER — Encounter (HOSPITAL_COMMUNITY): Payer: Self-pay

## 2011-01-13 ENCOUNTER — Ambulatory Visit (HOSPITAL_COMMUNITY)
Admission: RE | Admit: 2011-01-13 | Discharge: 2011-01-13 | Disposition: A | Discharge status: Home / Self Care | Payer: PRIVATE HEALTH INSURANCE | Source: Ambulatory Visit | Attending: Family Medicine | Admitting: Family Medicine

## 2011-01-13 DIAGNOSIS — N63 Unspecified lump in unspecified breast: Secondary | ICD-10-CM | POA: Insufficient documentation

## 2011-03-10 ENCOUNTER — Encounter: Payer: Self-pay | Admitting: Cardiology

## 2011-03-10 DIAGNOSIS — E119 Type 2 diabetes mellitus without complications: Secondary | ICD-10-CM | POA: Insufficient documentation

## 2011-03-30 ENCOUNTER — Other Ambulatory Visit: Payer: Self-pay | Admitting: Internal Medicine

## 2011-03-30 DIAGNOSIS — E049 Nontoxic goiter, unspecified: Secondary | ICD-10-CM

## 2011-04-05 ENCOUNTER — Ambulatory Visit
Admission: RE | Admit: 2011-04-05 | Discharge: 2011-04-05 | Disposition: A | Discharge status: Home / Self Care | Payer: PRIVATE HEALTH INSURANCE | Source: Ambulatory Visit | Attending: Internal Medicine | Admitting: Internal Medicine

## 2011-04-05 DIAGNOSIS — E049 Nontoxic goiter, unspecified: Secondary | ICD-10-CM

## 2011-05-31 ENCOUNTER — Encounter: Payer: Self-pay | Admitting: Adult Health

## 2011-05-31 ENCOUNTER — Ambulatory Visit (INDEPENDENT_AMBULATORY_CARE_PROVIDER_SITE_OTHER): Payer: PRIVATE HEALTH INSURANCE | Admitting: Adult Health

## 2011-05-31 VITALS — BP 115/67 | HR 71 | Resp 18 | Ht 64.0 in | Wt 267.0 lb

## 2011-05-31 DIAGNOSIS — I429 Cardiomyopathy, unspecified: Secondary | ICD-10-CM

## 2011-05-31 DIAGNOSIS — I10 Essential (primary) hypertension: Secondary | ICD-10-CM

## 2011-05-31 DIAGNOSIS — I5022 Chronic systolic (congestive) heart failure: Secondary | ICD-10-CM

## 2011-05-31 MED ORDER — CARVEDILOL 25 MG PO TABS: 25.0000 mg | ORAL_TABLET | Freq: Two times a day (BID) | ORAL | Status: DC

## 2011-05-31 MED ORDER — SPIRONOLACTONE 25 MG PO TABS: 25.0000 mg | ORAL_TABLET | Freq: Every day | ORAL | Status: DC

## 2011-05-31 NOTE — Patient Instructions (Signed)
Your physician recommends that you schedule a follow-up appointment in: 1 year with Curt Bears and first available with Dr [name]  Your physician has requested that you have an echocardiogram. Echocardiography is a painless test that uses sound waves to create images of your heart. It provides your doctor with information about the size and shape of your heart and how well your heart's chambers and valves are working. This procedure takes approximately one hour. There are no restrictions for this procedure.

## 2011-05-31 NOTE — Progress Notes (Signed)
HPI: [name] is a pleasant 66 y/o patient of Dr. [name] and Lattie Haw we are following for ongoing assessment and treatment of systolic dysfunction,  HTN, and history of ICD pacemaker lead dislodgement and microperforation leading to pericardial tamponade and pericardiocentesis.She is followed by Dr. Louanna Raw endocrinologist for diabetes and thyroid goiter. She is followed by Dr. Berdine Addison for PCP and labs. She has had no ER visits, new diagnosis or hospitalizations since last visit. She offers no complaints of chest pain, shortness of breath or edema.  No Known Allergies  Current Outpatient Prescriptions  Medication Sig Dispense Refill  . aspirin 81 MG tablet Take 81 mg by mouth daily.        . carvedilol (COREG) 25 MG tablet Take 1 tablet (25 mg total) by mouth 2 (two) times daily.  60 tablet  11  . Cholecalciferol (VITAMIN D3) 1000 UNITS CAPS Take 2 capsules by mouth daily.       [name] CINNAMON PO Take by mouth daily.      . digoxin (LANOXIN) 0.125 MG tablet Take 125 mcg by mouth daily.        . ferrous fumarate (HEMOCYTE - 106 MG FE) 325 (106 FE) MG TABS Take 1 tablet by mouth 2 (two) times daily.        . Flaxseed, Linseed, (FLAX SEED OIL PO) Take by mouth.      . furosemide (LASIX) 40 MG tablet Take 40 mg by mouth as needed.       [name] glipiZIDE (GLUCOTROL) 5 MG tablet Take 5 mg by mouth daily.        [name] l-methylfolate-B6-B12 (METANX) 3-35-2 MG TABS Take 1 tablet by mouth daily.        . metFORMIN (GLUCOPHAGE) 1000 MG tablet Take 1,000 mg by mouth 2 (two) times daily.        . pantoprazole (PROTONIX) 40 MG tablet Take 40 mg by mouth as needed.       [name] spironolactone (ALDACTONE) 25 MG tablet Take 25 mg by mouth daily.        . valsartan (DIOVAN) 160 MG tablet Take 160 mg by mouth daily.          Past Medical History  Diagnosis Date  . Pacemaker   . Systolic heart failure     Chronic  . Diabetes mellitus   . Hypertension   . Overweight   . Cardiomyopathy secondary     Past  Surgical History  Procedure Date  . Cardiac defibrillator placement     [id number] of systems complete and found to be negative unless listed above PHYSICAL EXAM BP 115/67  Pulse 71  Resp 18  Ht 5\' 4"  (1.626 m)  Wt 267 lb (121.11 kg)  BMI 45.83 kg/m2 General: Well developed, well nourished, in no acute distress, obese. Head: Eyes PERRLA, No xanthomas.   Normal cephalic and atramatic, large neck with goiter noted.  Lungs: Clear bilaterally to auscultation and percussion. Heart: HRRR S1 S2, without MRG.  Pulses are 2+ & equal.            No carotid bruit. No JVD.  No abdominal bruits. No femoral bruits. Abdomen: Bowel sounds are positive, abdomen soft and non-tender without masses or                  Hernia's noted. Msk:  Back normal, normal gait. Normal strength and tone for age. Extremities: No clubbing, cyanosis or edema.  DP +1 Neuro: Alert and oriented X 3.  Psych:  Good affect, responds appropriately  EKG:NSR Mild LVH. Rate of 71 bpm  ASSESSMENT AND PLAN

## 2011-05-31 NOTE — Assessment & Plan Note (Signed)
There is no evidence of fluid overload or edema. She is asymptomatic and medically compliant. She takes Lasix prn but remains on spironolactone and coreg daily.  Refills will be provided for these medications.  Labs are ordered and followed by both Dr. Buddy Duty and Dr. Berdine Addison.  Echo will be completed for ongoing assessment of LV fx.

## 2011-05-31 NOTE — Assessment & Plan Note (Signed)
Blood pressure is well controlled on current medications. No changes in her doses.  She is to adhere to low sodium diet.

## 2011-06-01 ENCOUNTER — Other Ambulatory Visit: Payer: Self-pay | Admitting: Internal Medicine

## 2011-06-01 DIAGNOSIS — E041 Nontoxic single thyroid nodule: Secondary | ICD-10-CM

## 2011-06-04 ENCOUNTER — Other Ambulatory Visit (HOSPITAL_COMMUNITY): Payer: PRIVATE HEALTH INSURANCE

## 2011-06-07 ENCOUNTER — Ambulatory Visit (HOSPITAL_COMMUNITY)
Admission: RE | Admit: 2011-06-07 | Discharge: 2011-06-07 | Disposition: A | Discharge status: Home / Self Care | Payer: PRIVATE HEALTH INSURANCE | Source: Ambulatory Visit | Attending: Adult Health | Admitting: Adult Health

## 2011-06-07 DIAGNOSIS — I501 Left ventricular failure: Secondary | ICD-10-CM | POA: Insufficient documentation

## 2011-06-07 DIAGNOSIS — I369 Nonrheumatic tricuspid valve disorder, unspecified: Secondary | ICD-10-CM

## 2011-06-07 DIAGNOSIS — I5022 Chronic systolic (congestive) heart failure: Secondary | ICD-10-CM

## 2011-06-07 DIAGNOSIS — I10 Essential (primary) hypertension: Secondary | ICD-10-CM | POA: Insufficient documentation

## 2011-06-07 DIAGNOSIS — I429 Cardiomyopathy, unspecified: Secondary | ICD-10-CM | POA: Insufficient documentation

## 2011-06-07 DIAGNOSIS — E119 Type 2 diabetes mellitus without complications: Secondary | ICD-10-CM | POA: Insufficient documentation

## 2011-06-07 NOTE — Progress Notes (Signed)
*  PRELIMINARY RESULTS* Echocardiogram 2D Echocardiogram has been performed.  [name] 06/07/2011, 12:03 PM

## 2011-06-08 ENCOUNTER — Other Ambulatory Visit (HOSPITAL_COMMUNITY)
Admission: RE | Admit: 2011-06-08 | Discharge: 2011-06-08 | Disposition: A | Discharge status: Home / Self Care | Payer: Medicare Other | Source: Ambulatory Visit | Attending: Interventional Radiology | Admitting: Interventional Radiology

## 2011-06-08 ENCOUNTER — Ambulatory Visit
Admission: RE | Admit: 2011-06-08 | Discharge: 2011-06-08 | Disposition: A | Discharge status: Home / Self Care | Payer: PRIVATE HEALTH INSURANCE | Source: Ambulatory Visit | Attending: Internal Medicine | Admitting: Internal Medicine

## 2011-06-08 DIAGNOSIS — E049 Nontoxic goiter, unspecified: Secondary | ICD-10-CM | POA: Insufficient documentation

## 2011-06-08 DIAGNOSIS — E041 Nontoxic single thyroid nodule: Secondary | ICD-10-CM

## 2011-06-23 ENCOUNTER — Encounter (INDEPENDENT_AMBULATORY_CARE_PROVIDER_SITE_OTHER): Payer: Self-pay | Admitting: General Surgery

## 2011-06-23 ENCOUNTER — Ambulatory Visit (INDEPENDENT_AMBULATORY_CARE_PROVIDER_SITE_OTHER): Payer: Medicare Other | Admitting: General Surgery

## 2011-06-23 VITALS — BP 118/70 | HR 80 | Temp 97.1°F | Resp 18 | Ht 64.0 in | Wt 268.8 lb

## 2011-06-23 DIAGNOSIS — E042 Nontoxic multinodular goiter: Secondary | ICD-10-CM

## 2011-06-23 NOTE — Patient Instructions (Signed)
Call us with your decision or if you have additional questions - ask for Luvenia Starch    Goiter Goiter is an enlarged thyroid gland. The thyroid gland sits at the base of the front of the neck. The gland produces hormones that regulate mood, body temperature, pulse rate, and digestion. Most goiters are painless and are not a cause for serious concern. Goiters and conditions that cause goiters can be treated if necessary.  CAUSES  Common causes of goiter include:  Graves disease (causes too much hormone to be produced [hyperthyroidism]).   Hashimoto's disease (causes too little hormone to be produced [hypothyroidism]).   Thyroiditis (inflammation of the thyroid sometimes caused by virus or pregnancy).   Nodular goiter (small bumps form; sometimes called toxic nodular goiter).   Pregnancy.   Thyroid cancer (very few goiters with nodules are cancerous).   Certain medications.   Radiation exposure.   Iodine deficiency (more common in developing countries in inland populations).  RISK FACTORS Risk factors for goiter include:  A family history of goiter.   Female gender.   Inadequate iodine in the diet.   Age older than 53 years.  SYMPTOMS  Many goiters do not cause symptoms. When symptoms do occur, they may include:  Swelling in the lower part of the neck. This swelling can range from a very small bump to a large lump.   A tight feeling in the throat.   A hoarse voice.  Less commonly, a goiter may result in:  Coughing.   Wheezing.   Difficulty swallowing.   Difficulty breathing.   Bulging neck veins.   Dizziness.  When a goiter is the result of hyperthyroidism, symptoms may include:  Rapid or irregular heart beat.   Sicknessin your stomach (nausea).   Vomiting.   Diarrhea.   Shaking.   Irritable feeling.   Bulging eyes.   Weight loss.   Heat sensitivity.   Anxiety.  When a goiter is the result of hypothyroidism, symptoms may include:  Tiredness.    Dry skin.   Constipation.   Weight gain.   Irregular menstrual cycle.   Depressed mood.   Sensitivity to cold.  DIAGNOSIS  Tests used to diagnose goiter include:  A physical exam.   Blood tests, including thyroid hormone levels and antibody testing.   Ultrasonography, computerized X-ray scan (computed tomography, CT) or computerized magnetic scan (magnetic resonance imaging, MRI).   Thyroid scan (imaging along with safe radioactive injection).   Tissue sample taken (biopsy) of nodules. This is sometimes done to confirm that the nodules are not cancerous.  TREATMENT  Treatment will depend on the cause of the goiter. Treatment may include:  Monitoring. In some cases, no treatment is necessary, and your doctor will monitor yourcondition at regular check ups.   Medications and supplements. Thyroid medication (thyroid hormone replacement) is available for hyperthroidism and hypothyroidism.   If inflammation is the cause, over-the-counter medication or steroid medication may be recommended.   Goiters caused by iodine deficiency can be treated with iodine supplements or changes in diet.   Radioactive iodine treatment. Radioactive iodine is injected into the blood. It travels to the thyroid gland, kills thyroid cells, and reduces the size of the gland. This is only used when the thyroid gland is overactive. Lifelong thyroid hormone medication is often necessary after this treatment.   Surgery. A procedure to remove all or part of the gland may be recommended in severe cases or when cancer is the cause. Hormones can be taken to replace  the hormones normally produced by the thyroid.  HOME CARE INSTRUCTIONS   Take medications as directed.   Follow your caregiver's recommendations for any dietary changes.   Follow up with your caregiver for further examination and testing, as directed.  PREVENTION   If you have a family history of goiter, discuss screening with your doctor.    Make sure you are getting enough iodine in your diet.   Use of iodized table salt can help prevent iodine deficiency.  Document Released: 08/26/2009 Document Revised: 02/25/2011 Document Reviewed: 08/26/2009 [facility] Patient Information 2012 [place].

## 2011-06-23 NOTE — Progress Notes (Signed)
Patient ID: [name], female   DOB: 1946/02/14, 66 y.o.   MRN: [id number]  Chief Complaint  Patient presents with  . Thyroid Nodule    New pt thyroid    HPI [name] is a 65 y.o. female.   HPI 66 year old morbidly obese African American female referred by Dr. Buddy Duty for evaluation of a multinodular goiter. The patient states that she was diagnosed with a nodular goiter several years ago while in the hospital for another medical issue. She states that it was biopsied over 5 years ago and it was benign. She states that she feels that it has enlarged over the past several years. She states that it is now more noticeable on the right. One of her main complaints is that she cannot sing for as long as she used to. She states that her voice will just tire out. She denies any hoarseness. She denies any difficulty swallowing liquids or solids. She denies any trouble breathing. She states that occasionally she'll feel something sitting on her throat. She denies any prior radiation. She denies any family history of thyroid cancer or any thyroid disorders. She denies any previous use of thyroid medications. She complains of some fatigue and sensations of feeling cold. She denies her neck pain painful or tender or recent fevers or chills.  Past Medical History  Diagnosis Date  . Pacemaker   . Systolic heart failure     Chronic  . Diabetes mellitus   . Hypertension   . Overweight   . Cardiomyopathy secondary     Past Surgical History  Procedure Date  . Cardiac defibrillator placement   . Cardiac defirillator removed   . Cholecystectomy open   . Stomach stapling surgery 1980  . Abdominal hysterectomy 1991  . Appendectomy 1991  . Tummy tuck 1991  . Leg lipoma   . Tubal ligation 1971    History reviewed. No pertinent family history.  Social History History  Substance Use Topics  . Smoking status: Never Smoker   . Smokeless tobacco: Not on file  . Alcohol Use: Yes    No  Known Allergies  Current Outpatient Prescriptions  Medication Sig Dispense Refill  . aspirin 81 MG tablet Take 81 mg by mouth daily.        . carvedilol (COREG) 25 MG tablet Take 1 tablet (25 mg total) by mouth 2 (two) times daily.  60 tablet  11  . Cholecalciferol (VITAMIN D3) 1000 UNITS CAPS Take 2 capsules by mouth daily.       [name] CINNAMON PO Take by mouth daily.      . digoxin (LANOXIN) 0.125 MG tablet Take 125 mcg by mouth daily.        . ferrous fumarate (HEMOCYTE - 106 MG FE) 325 (106 FE) MG TABS Take 1 tablet by mouth 2 (two) times daily.        . Flaxseed, Linseed, (FLAX SEED OIL PO) Take by mouth.      . furosemide (LASIX) 40 MG tablet Take 40 mg by mouth as needed.       [name] glipiZIDE (GLUCOTROL) 5 MG tablet Take 5 mg by mouth daily.        [name] l-methylfolate-B6-B12 (METANX) 3-35-2 MG TABS Take 1 tablet by mouth daily.        . metFORMIN (GLUCOPHAGE) 1000 MG tablet Take 1,000 mg by mouth 2 (two) times daily.        . pantoprazole (PROTONIX) 40 MG tablet Take 40 mg by  mouth as needed.       [name] spironolactone (ALDACTONE) 25 MG tablet Take 1 tablet (25 mg total) by mouth daily.  30 tablet  11  . valsartan (DIOVAN) 160 MG tablet Take 160 mg by mouth daily.          Review of Systems Review of Systems  Constitutional: Positive for fatigue. Negative for fever, chills, appetite change and unexpected weight change.  HENT: Negative for hearing loss, congestion, sore throat, trouble swallowing and voice change.        See hpi  Eyes: Negative for photophobia and visual disturbance.  Respiratory: Negative for cough, chest tightness, shortness of breath and wheezing.   Cardiovascular: Negative for chest pain, palpitations and leg swelling (none recently).       No CP< SOB, orthopnea, PND; had pacemaker removed  Gastrointestinal: Negative for nausea, vomiting, abdominal pain, diarrhea, constipation, blood in stool, abdominal distention and anal bleeding.  Genitourinary: Negative for hematuria,  vaginal bleeding and difficulty urinating.  Musculoskeletal: Negative for arthralgias.  Skin: Negative for rash and wound.  Neurological: Negative for seizures, syncope and headaches.  Hematological: Negative for adenopathy. Does not bruise/bleed easily.  Psychiatric/Behavioral: Negative for confusion.    Blood pressure 118/70, pulse 80, temperature 97.1 F (36.2 C), temperature source Temporal, resp. rate 18, height 5\' 4"  (1.626 m), weight 268 lb 12.8 oz (121.927 kg).  Physical Exam Physical Exam  Vitals reviewed. Constitutional: She is oriented to person, place, and time. She appears well-developed and well-nourished. No distress.       Morbidly obese  HENT:  Head: Normocephalic and atraumatic.  Right Ear: External ear normal.  Left Ear: External ear normal.  Nose: Nose normal.  Eyes: Conjunctivae are normal. No scleral icterus.  Neck: Normal range of motion and phonation normal. Neck supple. No JVD present. No tracheal tenderness present. No tracheal deviation present. Thyromegaly (Rt>>>left; non tender; ) present.       Mobile without fixation  Cardiovascular: Normal rate, regular rhythm and normal heart sounds.   Pulmonary/Chest: Effort normal and breath sounds normal. No stridor. No respiratory distress. She has no wheezes. She exhibits no tenderness.  Abdominal: Soft. Bowel sounds are normal. She exhibits no distension. There is no tenderness.  Musculoskeletal: Normal range of motion. She exhibits no edema.  Lymphadenopathy:       Head (right side): No submental, no submandibular, no preauricular, no posterior auricular and no occipital adenopathy present.       Head (left side): No submental, no submandibular, no preauricular, no posterior auricular and no occipital adenopathy present.    She has no cervical adenopathy.       Right cervical: No superficial cervical and no posterior cervical adenopathy present.      Left cervical: No superficial cervical and no posterior  cervical adenopathy present.       Right: No supraclavicular adenopathy present.       Left: No supraclavicular adenopathy present.  Neurological: She is alert and oriented to person, place, and time. She exhibits normal muscle tone.  Skin: Skin is warm and dry. She is not diaphoretic.  Psychiatric: She has a normal mood and affect. Her behavior is normal. Judgment and thought content normal.    Data Reviewed Recent [place] cardiology note from 3/13 Echo 3/13 Dr Cindra Eves note 06/01/11 Labs from 3/13 - TSH 1.57; FT4 0.7, Ca 9.7 Cytology from Rt thyroid bx - benign goiter  Thyroid u/s 03/30/11  THYROID ULTRASOUND  Technique: Ultrasound examination of the thyroid  gland and adjacent  soft tissues was performed.   Comparison: No prior thyroid ultrasounds available for comparison.   Findings:  Right thyroid lobe: 5.5 x 3.2 x 4.4 cm  Left thyroid lobe: 4.2 x 1.1 x 1.6 cm  Isthmus: 0.8 cm   Focal nodules: Dominant solid nodule in the mid to lower right  lobe is well delineated and measures approximately 4.1 x 2.2 x 4.3  cm. This nodule is delineated peripherally by increased  vascularity. No significant internal calcifications. Vaguely  delineated nodular enlargement of the isthmus present. An area of  isthmic nodularity was measured at 1.5 cm in greatest diameter by  the sonographer. Borders are very difficult to delineate and this  may be a relative area of pseudo-nodularity in an area of thyroid  enlargement. Tiny cystic areas in the left lobe are unremarkable.  Area of measured hypoechoic nodularity in the tip of the left lobe  is vaguely marginated and approximately 0.7 - 0.9 cm in diameter.  Lymphadenopathy: None visualized.   IMPRESSION:  Dominant solid nodule in the right lobe measuring 4.3 cm in  greatest diameter. Other smaller areas of nodularity present as  above. Isthmic nodularity and inferior left thyroid nodularity are  very vaguely marginated.   Assessment      Multinodular goiter with Dominant Rt lobe nodule with some compression symptoms    Plan    We discussed thyroid goiters. The patient was given educational material. We reviewed her ultrasound and her thyroid biopsy results. She is euthyroid. There is no evidence of hyper or hypo-functioning thyroid.  We discussed several management options. We discussed thyroid suppressive therapy, thyroid surgery, and radioactive iodine treatment. I explained that radioactive iodine treatment has really fallen out of favor.  Therefore we mainly talked about the pros and cons including the risk and benefit of thyroid suppressive therapy and a right thyroidectomy. We discussed the risk and benefits of surgery including but not limited to bleeding, infection, hematoma formation, injury to the parathyroid glands, injury to the laryngeal nerve, scarring, the potential need for calcium temporarily postoperatively, blood clot formation, anesthesia risks, and a typical postoperative course.  I do not believe at this time that she wants a total thyroidectomy. However we did discuss the right thyroidectomy as well as taking some of the isthmus. We also discussed ongoing observation with a followup ultrasound in 6 months.  I explained that the 2 best ways to treat her goiter is either thyroid suppressive therapy or surgical removal of a portion of her thyroid. I explained that without treatment her thyroid will likely continue to enlarge over time.  She is going to think about her options and call us with her decision.  Leighton Ruff. Redmond Pulling, MD, FACS General, Bariatric, & Minimally Invasive Surgery [facility], [place]        [facility] M 06/23/2011, 11:01 AM

## 2011-06-28 ENCOUNTER — Telehealth (INDEPENDENT_AMBULATORY_CARE_PROVIDER_SITE_OTHER): Payer: Self-pay | Admitting: General Surgery

## 2011-06-28 NOTE — Telephone Encounter (Signed)
Please put in orders

## 2011-07-02 ENCOUNTER — Other Ambulatory Visit (INDEPENDENT_AMBULATORY_CARE_PROVIDER_SITE_OTHER): Payer: Self-pay | Admitting: General Surgery

## 2011-07-05 ENCOUNTER — Encounter (INDEPENDENT_AMBULATORY_CARE_PROVIDER_SITE_OTHER): Payer: Self-pay

## 2011-07-05 ENCOUNTER — Telehealth (INDEPENDENT_AMBULATORY_CARE_PROVIDER_SITE_OTHER): Payer: Self-pay

## 2011-07-05 ENCOUNTER — Telehealth: Payer: Self-pay

## 2011-07-05 DIAGNOSIS — E041 Nontoxic single thyroid nodule: Secondary | ICD-10-CM

## 2011-07-05 NOTE — Telephone Encounter (Signed)
**Note De-Identified [name] Obfuscation** Pt. needs to be scheduled for thyroid surgery with Dr. Redmond Pulling at [facility] under general anesthesia in the near future and needs cardiac clearance. Pt. was last seen by [name] on 05-31-10 and had no complaints at that time. An Echo was ordered at that visit and completed on 3-18, results were normal. Please advise./LV

## 2011-07-05 NOTE — Telephone Encounter (Signed)
Patient has ICD pacemaker. This will need to be interrogated post procedure, with magnet applied during surgery to prevent unnecessary shocks. If this is done as an inpt, can call the cath lab to have [employer] rep see the patient post operatively, if OP, will need rep to see her in their office prior to her leaving. Otherwise she can proceed with thyroid surgery.  Curt Bears

## 2011-07-05 NOTE — Telephone Encounter (Signed)
**Note De-Identified Lorelee Mclaurin Obfuscation** Pamala Hurry, from [facility], states that generally pt's are held overnight for observation after this surgery and that she cant ask Dr. Redmond Pulling as he is out of office and un-pagable at this time. Also, pt. states that she no langer has a ICD or a pacemaker. Please advise./LV

## 2011-07-05 NOTE — Telephone Encounter (Signed)
Called [name] to notify her that DR Redmond Pulling wants [name] to have cardiac clearance before we can get her scheduled for thyroid surgery. The [name] see's Dr Crissie Sickles with [facility]. The [name] saw the P.A. Last month and everything checked out ok. I notified [name] that we will check with his office to see what needs to be done to received the cardiac clearance. We will call [name] back once we hear from Dr Tanna Furry office. The [name] understands.

## 2011-07-06 NOTE — Telephone Encounter (Signed)
Ok to proceed with surgery. Last note had her with ICD. If this has been removed, she can proceed without restrictions.

## 2011-07-12 ENCOUNTER — Encounter (HOSPITAL_COMMUNITY): Payer: Self-pay | Admitting: Respiratory Therapy

## 2011-07-19 NOTE — Pre-Procedure Instructions (Signed)
20 [name]  07/19/2011   Your procedure is scheduled on:  Tues, May 7 @ 0945 AM  Report to [place] at [place] AM.  Call this number if you have problems the morning of surgery: [phone]   Remember:   Do not eat food:After Midnight.  May have clear liquids: up to 4 Hours before arrival.(until 3:45 am)  Clear liquids include soda, tea, black coffee, apple or grape juice, broth,water  Take these medicines the morning of surgery with A SIP OF WATER: Carvedilol,Digoxin,and Protonix   Do not wear jewelry, make-up or nail polish.  Do not wear lotions, powders, or perfumes.   Do not shave 48 hours prior to surgery.  Do not bring valuables to the hospital.  Contacts, dentures or bridgework may not be worn into surgery.  Leave suitcase in the car. After surgery it may be brought to your room.  For patients admitted to the hospital, checkout time is 11:00 AM the day of discharge.   Special Instructions: CHG Shower Use Special Wash: 1/2 bottle night before surgery and 1/2 bottle morning of surgery.   Please read over the following fact sheets that you were given: Pain Booklet, Coughing and Deep Breathing, MRSA Information and Surgical Site Infection Prevention

## 2011-07-20 ENCOUNTER — Encounter (HOSPITAL_COMMUNITY)
Admission: RE | Admit: 2011-07-20 | Discharge: 2011-07-20 | Disposition: A | Discharge status: Home / Self Care | Payer: PRIVATE HEALTH INSURANCE | Source: Ambulatory Visit | Attending: General Surgery | Admitting: General Surgery

## 2011-07-20 ENCOUNTER — Encounter (HOSPITAL_COMMUNITY): Payer: Self-pay

## 2011-07-20 HISTORY — DX: Sleep apnea, unspecified: G47.30

## 2011-07-20 HISTORY — DX: Pneumonia, unspecified organism: J18.9

## 2011-07-20 HISTORY — DX: Unspecified osteoarthritis, unspecified site: M19.90

## 2011-07-20 HISTORY — DX: Anemia, unspecified: D64.9

## 2011-07-20 HISTORY — DX: Personal history of other diseases of the digestive system: Z87.19

## 2011-07-20 HISTORY — DX: Encounter for other specified aftercare: Z51.89

## 2011-07-20 HISTORY — DX: Polyneuropathy, unspecified: G62.9

## 2011-07-20 HISTORY — DX: Heart failure, unspecified: I50.9

## 2011-07-20 HISTORY — DX: Reserved for inherently not codable concepts without codable children: IMO0001

## 2011-07-20 HISTORY — DX: Unspecified hemorrhoids: K64.9

## 2011-07-20 HISTORY — DX: Nontoxic single thyroid nodule: E04.1

## 2011-07-20 HISTORY — DX: Gastro-esophageal reflux disease without esophagitis: K21.9

## 2011-07-20 HISTORY — DX: Cardiac arrhythmia, unspecified: I49.9

## 2011-07-20 LAB — BASIC METABOLIC PANEL
BUN: 25 mg/dL — ABNORMAL HIGH (ref 6–23)
Creatinine, Ser: 1.16 mg/dL — ABNORMAL HIGH (ref 0.50–1.10)
GFR calc non Af Amer: 49 mL/min — ABNORMAL LOW (ref >90)
Glucose, Bld: 115 mg/dL — ABNORMAL HIGH (ref 70–99)
Potassium: 4.6 mEq/L (ref 3.5–5.1)

## 2011-07-20 LAB — DIFFERENTIAL
Basophils Relative: 1 % (ref 0–1)
Eosinophils Absolute: 0.1 10*3/uL (ref 0.0–0.7)
Monocytes Absolute: 0.3 10*3/uL (ref 0.1–1.0)
Monocytes Relative: 7 % (ref 3–12)
Neutrophils Relative %: 63 % (ref 43–77)

## 2011-07-20 LAB — CBC
HCT: 29.4 % — ABNORMAL LOW (ref 36.0–46.0)
Hemoglobin: 9 g/dL — ABNORMAL LOW (ref 12.0–15.0)
MCH: 23.3 pg — ABNORMAL LOW (ref 26.0–34.0)
MCHC: 30.6 g/dL (ref 30.0–36.0)

## 2011-07-20 MED ORDER — CHLORHEXIDINE GLUCONATE 4 % EX LIQD: 1.0000 "application " | Freq: Once | CUTANEOUS | Status: DC

## 2011-07-20 NOTE — Progress Notes (Signed)
Sleep study done about 57yrs ago;doesn't use CPAP

## 2011-07-20 NOTE — Progress Notes (Signed)
Dr Crissie Sickles is cardiologist;last visit with PA a month ago  Stress test in epic from 1999 and 2005 Echo results in epic from 2005/10/28/13 Denies ever having a heart cath  Pt states that defib was placed >42yrs ago and only a few weeks into having it she went to get in the car and it went off and shocked her 15times.She came to ER,testing was done and found that defib had pierced sac of heart and 1000cc blood was drained;then a couple of weeks later from [place] to [place] it went off again and shocked her 72 times;defib was then removed  Medical MD is [place] with [facility]

## 2011-07-22 NOTE — Consult Note (Signed)
Anesthesia Chart Review:  Patient is a 66 year old female scheduled for a right thyroid lobectomy on 07/27/11 for a nodule.  History includes non-smoker, morbid obesity with BMI 46, HTN, chronic systolic CHF, non-ischemic cardiomyopathy with EF 40-45% (by echo 06/07/11), OSA without CPAP use, GERD, DM2, HH, cataracts, anemia with history of transfusion.  She has a history of ICD pacemaker lead dislodgement and microperforation leading to pericardial tamponade and pericardiocentesis '07.  In March of 2008 she had inappropriate shocks due lead migration, and she ultimately had explantation of a previously implanted lead and removal of the defibrillator generator on 06/06/06.  Her PCP is Dr. Iona Beard.  Dr. Buddy Duty is her Endocrinologist.  Her Cardiologist is Dr. Lattie Haw.  Her EP Cardiologist is Dr. [name].  Maryanna [facility] Cardiology NP, Jory Sims cleared patient for this procedure (see Letter from 07/06/11).  EKG on 05/31/11 showed NSR, minimal voltage criteria for LVH, cannot rule out anterior infarct (age undetermined).  Echo from 06/07/11 showed: - Left ventricle: The cavity size was normal. Wall thickness was normal. Systolic function was mildly to moderately reduced. The estimated ejection fraction was in the range of 40% to 45%. Diffuse hypokinesis. Doppler parameters are consistent with abnormal left ventricular relaxation (grade 1 diastolic dysfunction). - Aortic valve: Mildly calcified annulus. Trileaflet. - Mitral valve: Trivial regurgitation. - Left atrium: The atrium was mildly dilated. - Right atrium: The Eustachian valve appeared prominant. - Tricuspid valve: Mild regurgitation. - Pericardium, extracardiac: A trivial pericardial effusion was identified posterior to the heart.  CXR on 07/20/11 showed cardiomegaly with no active disease.   Labs noted.  H/H 9.0/29.4.  She has a history of anemia, but no recent comparison labs currently available (Hgb was 10 on 02/07/09).  Cr 1.16.  Glucose  115.  I ordered a T&S for the day of surgery.  Defer additional orders, if any, to Dr. Redmond Pulling.  Myra Gianotti, PA-C

## 2011-07-26 MED ORDER — CEFAZOLIN SODIUM-DEXTROSE 2-3 GM-% IV SOLR
2.0000 g | INTRAVENOUS | Status: AC
  Administered 2011-07-27: 2 g via INTRAVENOUS
  Filled 2011-07-26: qty 50

## 2011-07-27 ENCOUNTER — Encounter (HOSPITAL_COMMUNITY): Payer: Self-pay | Admitting: Surgery

## 2011-07-27 ENCOUNTER — Encounter (HOSPITAL_COMMUNITY): Payer: Self-pay | Admitting: Vascular Surgery

## 2011-07-27 ENCOUNTER — Ambulatory Visit (HOSPITAL_COMMUNITY): Payer: PRIVATE HEALTH INSURANCE | Admitting: Vascular Surgery

## 2011-07-27 ENCOUNTER — Encounter (HOSPITAL_COMMUNITY)
Admission: RE | Disposition: A | Discharge status: Home / Self Care | Payer: Self-pay | Source: Ambulatory Visit | Attending: General Surgery

## 2011-07-27 ENCOUNTER — Ambulatory Visit (HOSPITAL_COMMUNITY)
Admission: RE | Admit: 2011-07-27 | Discharge: 2011-07-28 | Disposition: A | Discharge status: Home / Self Care | Payer: PRIVATE HEALTH INSURANCE | Source: Ambulatory Visit | Attending: General Surgery | Admitting: General Surgery

## 2011-07-27 DIAGNOSIS — Z01818 Encounter for other preprocedural examination: Secondary | ICD-10-CM | POA: Insufficient documentation

## 2011-07-27 DIAGNOSIS — E042 Nontoxic multinodular goiter: Secondary | ICD-10-CM | POA: Diagnosis present

## 2011-07-27 DIAGNOSIS — M1990 Unspecified osteoarthritis, unspecified site: Secondary | ICD-10-CM | POA: Insufficient documentation

## 2011-07-27 DIAGNOSIS — I5022 Chronic systolic (congestive) heart failure: Secondary | ICD-10-CM | POA: Insufficient documentation

## 2011-07-27 DIAGNOSIS — G4730 Sleep apnea, unspecified: Secondary | ICD-10-CM | POA: Insufficient documentation

## 2011-07-27 DIAGNOSIS — E890 Postprocedural hypothyroidism: Secondary | ICD-10-CM

## 2011-07-27 DIAGNOSIS — I10 Essential (primary) hypertension: Secondary | ICD-10-CM

## 2011-07-27 DIAGNOSIS — E041 Nontoxic single thyroid nodule: Secondary | ICD-10-CM | POA: Insufficient documentation

## 2011-07-27 DIAGNOSIS — K219 Gastro-esophageal reflux disease without esophagitis: Secondary | ICD-10-CM | POA: Insufficient documentation

## 2011-07-27 DIAGNOSIS — E119 Type 2 diabetes mellitus without complications: Secondary | ICD-10-CM | POA: Diagnosis present

## 2011-07-27 DIAGNOSIS — Z01812 Encounter for preprocedural laboratory examination: Secondary | ICD-10-CM | POA: Insufficient documentation

## 2011-07-27 DIAGNOSIS — K449 Diaphragmatic hernia without obstruction or gangrene: Secondary | ICD-10-CM | POA: Insufficient documentation

## 2011-07-27 DIAGNOSIS — D34 Benign neoplasm of thyroid gland: Secondary | ICD-10-CM

## 2011-07-27 DIAGNOSIS — I509 Heart failure, unspecified: Secondary | ICD-10-CM | POA: Insufficient documentation

## 2011-07-27 DIAGNOSIS — G609 Hereditary and idiopathic neuropathy, unspecified: Secondary | ICD-10-CM | POA: Insufficient documentation

## 2011-07-27 DIAGNOSIS — Z9889 Other specified postprocedural states: Secondary | ICD-10-CM

## 2011-07-27 HISTORY — PX: THYROID LOBECTOMY: SHX420

## 2011-07-27 LAB — GLUCOSE, CAPILLARY
Glucose-Capillary: 102 mg/dL — ABNORMAL HIGH (ref 70–99)
Glucose-Capillary: 93 mg/dL (ref 70–99)
Glucose-Capillary: 152 mg/dL — ABNORMAL HIGH (ref 70–99)

## 2011-07-27 SURGERY — LOBECTOMY, THYROID
Anesthesia: General | Site: Neck | Laterality: Right | Wound class: Clean

## 2011-07-27 MED ORDER — DOCUSATE SODIUM 100 MG PO CAPS
100.0000 mg | ORAL_CAPSULE | Freq: Two times a day (BID) | ORAL | Status: DC
  Administered 2011-07-27 – 2011-07-28 (×2): 100 mg via ORAL
  Filled 2011-07-27 (×2): qty 1

## 2011-07-27 MED ORDER — HYDROMORPHONE HCL PF 1 MG/ML IJ SOLN
0.2500 mg | INTRAMUSCULAR | Status: DC | PRN
  Administered 2011-07-27: 0.5 mg via INTRAVENOUS

## 2011-07-27 MED ORDER — IRBESARTAN 150 MG PO TABS
150.0000 mg | ORAL_TABLET | Freq: Every day | ORAL | Status: DC
  Administered 2011-07-27: 150 mg via ORAL
  Filled 2011-07-27 (×2): qty 1

## 2011-07-27 MED ORDER — SUCCINYLCHOLINE CHLORIDE 20 MG/ML IJ SOLN
INTRAMUSCULAR | Status: DC | PRN
  Administered 2011-07-27: 140 mg via INTRAVENOUS

## 2011-07-27 MED ORDER — HYDROCODONE-ACETAMINOPHEN 5-325 MG PO TABS
1.0000 | ORAL_TABLET | ORAL | Status: DC | PRN
  Administered 2011-07-27: 2 via ORAL
  Administered 2011-07-28: 1 via ORAL
  Filled 2011-07-27: qty 1
  Filled 2011-07-27: qty 2

## 2011-07-27 MED ORDER — HEMOSTATIC AGENTS (NO CHARGE) OPTIME
TOPICAL | Status: DC | PRN
  Administered 2011-07-27: 1

## 2011-07-27 MED ORDER — LIDOCAINE HCL (CARDIAC) 20 MG/ML IV SOLN
INTRAVENOUS | Status: DC | PRN
  Administered 2011-07-27: 150 mg via INTRAVENOUS
  Administered 2011-07-27: 50 mg via INTRAVENOUS

## 2011-07-27 MED ORDER — NEOSTIGMINE METHYLSULFATE 1 MG/ML IJ SOLN
INTRAMUSCULAR | Status: DC | PRN
  Administered 2011-07-27: 4 mg via INTRAVENOUS

## 2011-07-27 MED ORDER — ONDANSETRON HCL 4 MG PO TABS: 4.0000 mg | ORAL_TABLET | Freq: Four times a day (QID) | ORAL | Status: DC | PRN

## 2011-07-27 MED ORDER — FERROUS FUMARATE 325 (106 FE) MG PO TABS
1.0000 | ORAL_TABLET | ORAL | Status: DC
  Filled 2011-07-27 (×2): qty 1

## 2011-07-27 MED ORDER — FAMOTIDINE 20 MG PO TABS
20.0000 mg | ORAL_TABLET | Freq: Two times a day (BID) | ORAL | Status: DC
  Administered 2011-07-28: 20 mg via ORAL
  Filled 2011-07-27 (×4): qty 1

## 2011-07-27 MED ORDER — MENTHOL 3 MG MT LOZG
1.0000 | LOZENGE | OROMUCOSAL | Status: DC | PRN
  Administered 2011-07-27: 3 mg via ORAL
  Filled 2011-07-27: qty 9

## 2011-07-27 MED ORDER — BUPIVACAINE-EPINEPHRINE PF 0.25-1:200000 % IJ SOLN
INTRAMUSCULAR | Status: DC | PRN
  Administered 2011-07-27: 1 mL

## 2011-07-27 MED ORDER — LACTATED RINGERS IV SOLN
INTRAVENOUS | Status: DC
  Administered 2011-07-27: 10:00:00 via INTRAVENOUS

## 2011-07-27 MED ORDER — FENTANYL CITRATE 0.05 MG/ML IJ SOLN
INTRAMUSCULAR | Status: DC | PRN
  Administered 2011-07-27: 100 ug via INTRAVENOUS

## 2011-07-27 MED ORDER — MIDAZOLAM HCL 5 MG/5ML IJ SOLN
INTRAMUSCULAR | Status: DC | PRN
  Administered 2011-07-27: 2 mg via INTRAVENOUS

## 2011-07-27 MED ORDER — LACTATED RINGERS IV SOLN
INTRAVENOUS | Status: DC | PRN
  Administered 2011-07-27 (×2): via INTRAVENOUS

## 2011-07-27 MED ORDER — PROPOFOL 10 MG/ML IV EMUL
INTRAVENOUS | Status: DC | PRN
  Administered 2011-07-27: 180 mg via INTRAVENOUS

## 2011-07-27 MED ORDER — SPIRONOLACTONE 25 MG PO TABS
25.0000 mg | ORAL_TABLET | Freq: Every day | ORAL | Status: DC
Start: 2011-07-27 — End: 2011-07-28
  Administered 2011-07-27 – 2011-07-28 (×2): 25 mg via ORAL
  Filled 2011-07-27 (×2): qty 1

## 2011-07-27 MED ORDER — DIGOXIN 125 MCG PO TABS
125.0000 ug | ORAL_TABLET | Freq: Every day | ORAL | Status: DC
  Administered 2011-07-27: 125 ug via ORAL
  Filled 2011-07-27 (×2): qty 1

## 2011-07-27 MED ORDER — PHENOL 1.4 % MT LIQD
1.0000 | OROMUCOSAL | Status: DC | PRN
  Administered 2011-07-27: 1 via OROMUCOSAL
  Filled 2011-07-27: qty 177

## 2011-07-27 MED ORDER — ROCURONIUM BROMIDE 100 MG/10ML IV SOLN
INTRAVENOUS | Status: DC | PRN
  Administered 2011-07-27: 50 mg via INTRAVENOUS

## 2011-07-27 MED ORDER — GLIPIZIDE 5 MG PO TABS: 5.0000 mg | ORAL_TABLET | Freq: Every day | ORAL | Status: DC

## 2011-07-27 MED ORDER — INSULIN ASPART 100 UNIT/ML ~~LOC~~ SOLN: 0.0000 [IU] | Freq: Three times a day (TID) | SUBCUTANEOUS | Status: DC

## 2011-07-27 MED ORDER — GLIPIZIDE 5 MG PO TABS
5.0000 mg | ORAL_TABLET | Freq: Every day | ORAL | Status: DC
  Administered 2011-07-27: 5 mg via ORAL
  Filled 2011-07-27 (×2): qty 1

## 2011-07-27 MED ORDER — METFORMIN HCL 500 MG PO TABS
1000.0000 mg | ORAL_TABLET | Freq: Two times a day (BID) | ORAL | Status: DC
  Administered 2011-07-27 – 2011-07-28 (×2): 1000 mg via ORAL
  Filled 2011-07-27 (×5): qty 2

## 2011-07-27 MED ORDER — ONDANSETRON HCL 4 MG/2ML IJ SOLN
INTRAMUSCULAR | Status: DC | PRN
  Administered 2011-07-27: 4 mg via INTRAVENOUS

## 2011-07-27 MED ORDER — INSULIN ASPART 100 UNIT/ML ~~LOC~~ SOLN: 0.0000 [IU] | Freq: Every day | SUBCUTANEOUS | Status: DC

## 2011-07-27 MED ORDER — PHENYLEPHRINE HCL 10 MG/ML IJ SOLN
INTRAMUSCULAR | Status: DC | PRN
  Administered 2011-07-27 (×2): 100 ug via INTRAVENOUS

## 2011-07-27 MED ORDER — 0.9 % SODIUM CHLORIDE (POUR BTL) OPTIME
TOPICAL | Status: DC | PRN
  Administered 2011-07-27: 1000 mL

## 2011-07-27 MED ORDER — ONDANSETRON HCL 4 MG/2ML IJ SOLN: 4.0000 mg | Freq: Four times a day (QID) | INTRAMUSCULAR | Status: DC | PRN

## 2011-07-27 MED ORDER — POTASSIUM CHLORIDE IN NACL 20-0.9 MEQ/L-% IV SOLN
INTRAVENOUS | Status: DC
  Administered 2011-07-27: 15:00:00 via INTRAVENOUS
  Filled 2011-07-27 (×2): qty 1000

## 2011-07-27 MED ORDER — MORPHINE SULFATE 2 MG/ML IJ SOLN
1.0000 mg | INTRAMUSCULAR | Status: DC | PRN
  Administered 2011-07-27: 2 mg via INTRAVENOUS
  Filled 2011-07-27: qty 1

## 2011-07-27 MED ORDER — CARVEDILOL 25 MG PO TABS
25.0000 mg | ORAL_TABLET | Freq: Two times a day (BID) | ORAL | Status: DC
  Administered 2011-07-27 – 2011-07-28 (×2): 25 mg via ORAL
  Filled 2011-07-27 (×4): qty 1

## 2011-07-27 MED ORDER — ONDANSETRON HCL 4 MG/2ML IJ SOLN
4.0000 mg | Freq: Once | INTRAMUSCULAR | Status: AC | PRN
  Administered 2011-07-27: 4 mg via INTRAVENOUS

## 2011-07-27 MED ORDER — GLYCOPYRROLATE 0.2 MG/ML IJ SOLN
INTRAMUSCULAR | Status: DC | PRN
  Administered 2011-07-27: .5 mg via INTRAVENOUS

## 2011-07-27 MED ORDER — DEXTROSE 5 % IV SOLN
INTRAVENOUS | Status: DC | PRN
  Administered 2011-07-27: 10:00:00 via INTRAVENOUS

## 2011-07-27 SURGICAL SUPPLY — 56 items
BENZOIN TINCTURE PRP APPL 2/3 (GAUZE/BANDAGES/DRESSINGS) ×2 IMPLANT
BLADE SURG 10 STRL SS (BLADE) ×2 IMPLANT
BLADE SURG 15 STRL LF DISP TIS (BLADE) ×1 IMPLANT
BLADE SURG 15 STRL SS (BLADE) ×2
BLADE SURG ROTATE 9660 (MISCELLANEOUS) IMPLANT
CANISTER SUCTION 2500CC (MISCELLANEOUS) ×2 IMPLANT
CHLORAPREP W/TINT 10.5 ML (MISCELLANEOUS) ×2 IMPLANT
CLIP TI MEDIUM 24 (CLIP) ×4 IMPLANT
CLIP TI WIDE RED SMALL 24 (CLIP) ×2 IMPLANT
CLOTH BEACON ORANGE TIMEOUT ST (SAFETY) ×2 IMPLANT
CONT SPEC 4OZ CLIKSEAL STRL BL (MISCELLANEOUS) ×2 IMPLANT
COVER SURGICAL LIGHT HANDLE (MISCELLANEOUS) ×2 IMPLANT
CRADLE DONUT ADULT HEAD (MISCELLANEOUS) ×2 IMPLANT
DRAPE PED LAPAROTOMY (DRAPES) ×2 IMPLANT
DRAPE UTILITY 15X26 W/TAPE STR (DRAPE) ×4 IMPLANT
DRSG TEGADERM 4X4.75 (GAUZE/BANDAGES/DRESSINGS) ×2 IMPLANT
ELECT CAUTERY BLADE 6.4 (BLADE) ×2 IMPLANT
ELECT COATED BLADE 2.86 ST (ELECTRODE) ×2 IMPLANT
ELECT REM PT RETURN 9FT ADLT (ELECTROSURGICAL) ×2
ELECTRODE REM PT RTRN 9FT ADLT (ELECTROSURGICAL) ×1 IMPLANT
GAUZE SPONGE 4X4 16PLY XRAY LF (GAUZE/BANDAGES/DRESSINGS) ×2 IMPLANT
GLOVE BIOGEL M STRL SZ7.5 (GLOVE) ×2 IMPLANT
GLOVE BIOGEL PI IND STRL 7.0 (GLOVE) ×1 IMPLANT
GLOVE BIOGEL PI IND STRL 8 (GLOVE) ×1 IMPLANT
GLOVE BIOGEL PI INDICATOR 7.0 (GLOVE) ×1
GLOVE BIOGEL PI INDICATOR 8 (GLOVE) ×1
GLOVE SURG ORTHO 8.0 STRL STRW (GLOVE) ×2 IMPLANT
GLOVE SURG SS PI 7.0 STRL IVOR (GLOVE) ×4 IMPLANT
GOWN STRL NON-REIN LRG LVL3 (GOWN DISPOSABLE) ×2 IMPLANT
GOWN STRL REIN XL XLG (GOWN DISPOSABLE) ×4 IMPLANT
HEMOSTAT SURGICEL 2X14 (HEMOSTASIS) IMPLANT
HEMOSTAT SURGICEL 2X4 FIBR (HEMOSTASIS) ×2 IMPLANT
KIT BASIN OR (CUSTOM PROCEDURE TRAY) ×2 IMPLANT
KIT ROOM TURNOVER OR (KITS) ×2 IMPLANT
NEEDLE HYPO 25GX1X1/2 BEV (NEEDLE) ×2 IMPLANT
NS IRRIG 1000ML POUR BTL (IV SOLUTION) ×2 IMPLANT
PACK SURGICAL SETUP 50X90 (CUSTOM PROCEDURE TRAY) ×2 IMPLANT
PAD ARMBOARD 7.5X6 YLW CONV (MISCELLANEOUS) ×4 IMPLANT
PENCIL BUTTON HOLSTER BLD 10FT (ELECTRODE) ×2 IMPLANT
SHEARS HARMONIC 9CM CVD (BLADE) ×2 IMPLANT
SPECIMEN JAR MEDIUM (MISCELLANEOUS) IMPLANT
SPONGE GAUZE 4X4 12PLY (GAUZE/BANDAGES/DRESSINGS) ×2 IMPLANT
SPONGE INTESTINAL PEANUT (DISPOSABLE) ×2 IMPLANT
STAPLER VISISTAT 35W (STAPLE) IMPLANT
STRIP CLOSURE SKIN 1/2X4 (GAUZE/BANDAGES/DRESSINGS) ×2 IMPLANT
SUT MNCRL AB 4-0 PS2 18 (SUTURE) ×2 IMPLANT
SUT SILK 2 0 (SUTURE) ×2
SUT SILK 2-0 18XBRD TIE 12 (SUTURE) ×1 IMPLANT
SUT VIC AB 3-0 SH 18 (SUTURE) ×4 IMPLANT
SYR BULB 3OZ (MISCELLANEOUS) ×2 IMPLANT
SYR CONTROL 10ML LL (SYRINGE) ×2 IMPLANT
TAPE CLOTH SURG 4X10 WHT LF (GAUZE/BANDAGES/DRESSINGS) ×2 IMPLANT
TOWEL OR 17X24 6PK STRL BLUE (TOWEL DISPOSABLE) ×2 IMPLANT
TOWEL OR 17X26 10 PK STRL BLUE (TOWEL DISPOSABLE) ×2 IMPLANT
TUBE CONNECTING 12X1/4 (SUCTIONS) ×2 IMPLANT
WATER STERILE IRR 1000ML POUR (IV SOLUTION) ×2 IMPLANT

## 2011-07-27 NOTE — Brief Op Note (Signed)
07/27/2011  11:39 AM  PATIENT:  [name]  66 y.o. female  PRE-OPERATIVE DIAGNOSIS:  right thyroid nodule   POST-OPERATIVE DIAGNOSIS:  right thyroid nodule   PROCEDURE:  Procedure(s) (LRB): THYROID LOBECTOMY (Right) with isthmusectomy   SURGEON:  Surgeon(s) and Role:    * Gayland Curry, MD,FACS - Primary    * Earnstine Regal, MD - Assisting  PHYSICIAN ASSISTANT: none  ASSISTANTS: see above   ANESTHESIA:   general  EBL:  Total I/O In: 1300 [I.V.:1300] Out: 50 [Blood:50]  BLOOD ADMINISTERED:none  DRAINS: none   LOCAL MEDICATIONS USED:  MARCAINE     SPECIMEN:  Source of Specimen:  right thyroid lobe, stitch superior pole  DISPOSITION OF SPECIMEN:  PATHOLOGY  COUNTS:  YES  TOURNIQUET:  * No tourniquets in log *  DICTATION: .Other Dictation: Dictation Number [id number]  PLAN OF CARE: Admit for overnight observation  PATIENT DISPOSITION:  PACU - hemodynamically stable.   Delay start of Pharmacological VTE agent (>24hrs) due to surgical blood loss or risk of bleeding: yes  Leighton Ruff. Redmond Pulling, MD, FACS General, Bariatric, & Minimally Invasive Surgery [facility], [place]

## 2011-07-27 NOTE — Anesthesia Preprocedure Evaluation (Addendum)
Anesthesia Evaluation  Patient identified by MRN, date of birth, ID band Patient awake    Reviewed: Allergy & Precautions, H&P , NPO status , Patient's Chart, lab work & pertinent test results, reviewed documented beta blocker date and time   Airway Mallampati: II TM Distance: >3 FB Neck ROM: Full    Dental  (+) Partial Upper, Partial Lower and Dental Advisory Given   Pulmonary sleep apnea ,  breath sounds clear to auscultation        Cardiovascular hypertension, Pt. on medications and Pt. on home beta blockers +CHF + dysrhythmias - pacemakerRhythm:Regular Rate:Normal     Neuro/Psych negative psych ROS   GI/Hepatic Neg liver ROS, hiatal hernia, GERD-  Medicated and Controlled,  Endo/Other  Diabetes mellitus-, Well Controlled, Type 2, Oral Hypoglycemic AgentsMorbid obesity  Renal/GU   negative genitourinary   Musculoskeletal  (+) Arthritis -, Osteoarthritis,    Abdominal   Peds  Hematology negative hematology ROS (+)   Anesthesia Other Findings   Reproductive/Obstetrics negative OB ROS                         Anesthesia Physical Anesthesia Plan  ASA: III  Anesthesia Plan: General   Post-op Pain Management:    Induction: Intravenous  Airway Management Planned: Oral ETT  Additional Equipment:   Intra-op Plan:   Post-operative Plan: Extubation in OR  Informed Consent: I have reviewed the patients History and Physical, chart, labs and discussed the procedure including the risks, benefits and alternatives for the proposed anesthesia with the patient or authorized representative who has indicated his/her understanding and acceptance.   Dental advisory given  Plan Discussed with:   Anesthesia Plan Comments: (Obesity Large goiter without apparent respiratory symptoms Htn Cardiomyopathy EF 40-45% Previous AICD removed 2008 Cleared for surgery by [facility] GERD Sleep apnea does  not use CPAP at present  Plan GA with oral ETT  Roberts Gaudy, MD )       Anesthesia Quick Evaluation

## 2011-07-27 NOTE — Transfer of Care (Signed)
Immediate Anesthesia Transfer of Care Note  Patient: [name]  Procedure(s) Performed: Procedure(s) (LRB): THYROID LOBECTOMY (Right)  Patient Location: PACU  Anesthesia Type: General  Level of Consciousness: awake, alert  and oriented  Airway & Oxygen Therapy: Patient Spontanous Breathing and Patient connected to nasal cannula oxygen  Post-op Assessment: Report given to PACU RN and Post -op Vital signs reviewed and stable  Post vital signs: Reviewed and stable  Complications: No apparent anesthesia complications

## 2011-07-27 NOTE — H&P (Signed)
[name] is an 66 y.o. female.   Chief Complaint: here for surgery  HPI: 66 year old morbidly obese African American female referred by Dr. Buddy Duty for evaluation of a multinodular goiter. The patient states that she was diagnosed with a nodular goiter several years ago while in the hospital for another medical issue. She states that it was biopsied over 5 years ago and it was benign. She states that she feels that it has enlarged over the past several years. She states that it is now more noticeable on the right. One of her main complaints is that she cannot sing for as long as she used to. She states that her voice will just tire out. She denies any hoarseness. She denies any difficulty swallowing liquids or solids. She denies any trouble breathing. She states that occasionally she'll feel something sitting on her throat. She denies any prior radiation. She denies any family history of thyroid cancer or any thyroid disorders. She denies any previous use of thyroid medications. She complains of some fatigue and sensations of feeling cold. She denies her neck pain painful or tender or recent fevers or chills.   Past Medical History  Diagnosis Date  . Pacemaker   . Systolic heart failure     Chronic  . Overweight   . Cardiomyopathy secondary   . Hypertension     takes Diovan and Spirololactone daily  . Dysrhythmia     pt takes Digoxin and Carvedilol daily  . CHF (congestive heart failure)     takes Lasix prn  . Sleep apnea     doesn't use CPAP  . Pneumonia     hx of;many yrs ago  . Peripheral neuropathy   . Arthritis     ankles  . Gout     hx of x 1 time in Aug 2012  . GERD (gastroesophageal reflux disease)     takes Protonix seldom  . H/O hiatal hernia   . Hemorrhoid   . Anemia     takes Ferrous Fumarate 2 times week  . Blood transfusion     hx of-72yrs ago  . Thyroid nodule     right  . Diabetes mellitus     takes Glipizide and Metformin daily  . Cataract    bilateral and immature    Past Surgical History  Procedure Date  . Cardiac defibrillator placement >12yrs ago  . Cardiac defirillator removed     removed <48months;removed d/t shocking pt 15times;pierced sac of heart 1000cc ;drained  . Stomach stapling surgery 80's  . Appendectomy 1991  . Tummy tuck 1991  . Leg lipoma   . Abdominal hysterectomy 1991  . Tubal ligation 1971  . Ovarian cyst removed 1991  . Cholecystectomy open 1992  . Colonoscopy     Family History  Problem Relation Age of Onset  . Anesthesia problems Neg Hx   . Hypotension Neg Hx   . Malignant hyperthermia Neg Hx   . Pseudochol deficiency Neg Hx    Social History:  reports that she has never smoked. She does not have any smokeless tobacco history on file. She reports that she drinks alcohol. She reports that she does not use illicit drugs.  Allergies: No Known Allergies  Medications Prior to Admission  Medication Sig Dispense Refill  . aspirin 81 MG tablet Take 81 mg by mouth daily.        . carvedilol (COREG) 25 MG tablet Take 1 tablet (25 mg total) by mouth 2 (two) times daily.  60 tablet  11  . Cholecalciferol (VITAMIN D3) 1000 UNITS CAPS Take 2 capsules by mouth daily.       [name] CINNAMON PO Take 2 capsules by mouth daily.       . digoxin (LANOXIN) 0.125 MG tablet Take 125 mcg by mouth daily.        . ferrous fumarate (HEMOCYTE - 106 MG FE) 325 (106 FE) MG TABS Take 1 tablet by mouth 2 (two) times a week.       . Flaxseed, Linseed, (FLAX SEED OIL PO) Take 1 capsule by mouth daily.       . furosemide (LASIX) 40 MG tablet Take 40 mg by mouth as needed.       [name] glipiZIDE (GLUCOTROL) 5 MG tablet Take 5 mg by mouth daily.        [name] l-methylfolate-B6-B12 (METANX) 3-35-2 MG TABS Take 1 tablet by mouth daily.        . metFORMIN (GLUCOPHAGE) 1000 MG tablet Take 1,000 mg by mouth 2 (two) times daily.        . pantoprazole (PROTONIX) 40 MG tablet Take 40 mg by mouth as needed.       [name] spironolactone (ALDACTONE) 25 MG  tablet Take 1 tablet (25 mg total) by mouth daily.  30 tablet  11  . valsartan (DIOVAN) 160 MG tablet Take 160 mg by mouth daily.          Results for orders placed during the hospital encounter of 07/27/11 (from the past 48 hour(s))  TYPE AND SCREEN     Status: Normal   Collection Time   07/27/11  7:55 AM      Component Value Range Comment   ABO/RH(D) B NEG      Antibody Screen NEG      Sample Expiration 07/30/2011     GLUCOSE, CAPILLARY     Status: Abnormal   Collection Time   07/27/11  8:04 AM      Component Value Range Comment   Glucose-Capillary 102 (*) 70 - 99 (mg/dL)   GLUCOSE, CAPILLARY     Status: Normal   Collection Time   07/27/11  9:46 AM      Component Value Range Comment   Glucose-Capillary 93  70 - 99 (mg/dL)    No results found.  Review of Systems  Constitutional: Negative for fever, chills and diaphoresis.  HENT: Negative for hearing loss and sore throat.   Eyes: Negative for photophobia.  Respiratory: Negative for shortness of breath and stridor.   Cardiovascular: Negative for chest pain, orthopnea and PND.  Gastrointestinal: Negative for heartburn, nausea and abdominal pain.  Genitourinary: Negative for dysuria.  Neurological: Negative for tingling, tremors, speech change, loss of consciousness, weakness and headaches.  Psychiatric/Behavioral: Negative for depression.    Blood pressure 115/81, pulse 67, temperature 98.3 F (36.8 C), temperature source Oral, resp. rate 18, SpO2 99.00%. Physical Exam  Constitutional: She is oriented to person, place, and time. She appears well-developed and well-nourished. No distress.  HENT:  Head: Normocephalic and atraumatic.  Right Ear: External ear normal.  Eyes: Conjunctivae are normal.  Neck: Normal range of motion. Neck supple. No tracheal deviation present. Thyromegaly (rt) present.  Cardiovascular: Normal rate and normal heart sounds.   Respiratory: Effort normal and breath sounds normal.  GI: Soft. Bowel sounds  are normal. There is no tenderness.  Lymphadenopathy:    She has no cervical adenopathy.  Neurological: She is alert and oriented to person, place, and time.  Skin: Skin is warm and dry. She is not diaphoretic.  Psychiatric: She has a normal mood and affect. Her behavior is normal. Judgment and thought content normal.     Assessment/Plan Right Thyroid multinodular goiter For Rt thyroid lobectomy  Discussed risks and benefit of surgery. All questions answered.   Leighton Ruff. Redmond Pulling, MD, FACS General, Bariatric, & Minimally Invasive Surgery [facility], [place]   [facility] M 07/27/2011, 9:52 AM

## 2011-07-27 NOTE — Anesthesia Procedure Notes (Signed)
Procedure Name: Intubation Date/Time: 07/27/2011 9:56 AM Performed by: Mosie Epstein Pre-anesthesia Checklist: Patient identified, Emergency Drugs available, Suction available, Patient being monitored and Timeout performed Patient Re-evaluated:Patient Re-evaluated prior to inductionOxygen Delivery Method: Circle system utilized Preoxygenation: Pre-oxygenation with 100% oxygen Intubation Type: IV induction Ventilation: Mask ventilation without difficulty Laryngoscope Size: Mac and 3 Grade View: Grade I Tube type: Oral Tube size: 7.5 mm Number of attempts: 1 Airway Equipment and Method: Stylet Placement Confirmation: ETT inserted through vocal cords under direct vision,  positive ETCO2 and breath sounds checked- equal and bilateral Secured at: 21 cm Tube secured with: Tape Dental Injury: Teeth and Oropharynx as per pre-operative assessment

## 2011-07-27 NOTE — Preoperative (Signed)
Beta Blockers   Reason not to administer Beta Blockers:Not Applicable, takem this am per patient

## 2011-07-28 ENCOUNTER — Encounter (HOSPITAL_COMMUNITY): Payer: Self-pay | Admitting: General Surgery

## 2011-07-28 DIAGNOSIS — Z9889 Other specified postprocedural states: Secondary | ICD-10-CM

## 2011-07-28 DIAGNOSIS — Z9009 Acquired absence of other part of head and neck: Secondary | ICD-10-CM | POA: Insufficient documentation

## 2011-07-28 DIAGNOSIS — E890 Postprocedural hypothyroidism: Secondary | ICD-10-CM | POA: Insufficient documentation

## 2011-07-28 LAB — GLUCOSE, CAPILLARY: Glucose-Capillary: 140 mg/dL — ABNORMAL HIGH (ref 70–99)

## 2011-07-28 LAB — BASIC METABOLIC PANEL
BUN: 24 mg/dL — ABNORMAL HIGH (ref 6–23)
Chloride: 102 mEq/L (ref 96–112)
GFR calc Af Amer: 53 mL/min — ABNORMAL LOW (ref >90)
Potassium: 4.9 mEq/L (ref 3.5–5.1)
Sodium: 136 mEq/L (ref 135–145)

## 2011-07-28 LAB — CBC
HCT: 26.4 % — ABNORMAL LOW (ref 36.0–46.0)
Hemoglobin: 8.1 g/dL — ABNORMAL LOW (ref 12.0–15.0)
MCHC: 30.7 g/dL (ref 30.0–36.0)
RBC: 3.49 MIL/uL — ABNORMAL LOW (ref 3.87–5.11)

## 2011-07-28 MED ORDER — HYDROCODONE-ACETAMINOPHEN 5-325 MG PO TABS: 1.0000 | ORAL_TABLET | ORAL | Status: AC | PRN

## 2011-07-28 MED ORDER — DSS 100 MG PO CAPS: 100.0000 mg | ORAL_CAPSULE | Freq: Two times a day (BID) | ORAL | Status: AC

## 2011-07-28 NOTE — Op Note (Signed)
[name], NEUJAHR NO.:  [id number]  MEDICAL RECORD NO.:  [id number]  LOCATION:  [id number]                         FACILITY:  [place] [facility]  PHYSICIAN:  Leighton Ruff. Redmond Pulling, MD, FACSDATE OF BIRTH:  [birth year]  DATE OF PROCEDURE:  07/27/2011 DATE OF DISCHARGE:                              OPERATIVE REPORT   PREOPERATIVE DIAGNOSIS:  Right thyroid nodule.  POSTOP DIAGNOSIS:  Right thyroid nodule.  PROCEDURE:  Right thyroid lobectomy with isthmusectomy.  SURGEON:  Leighton Ruff. Redmond Pulling, MD, FACS.  ASSISTANT SURGEON:  Scherrie Merritts, MD, FACS  ANESTHESIA:  General plus 10 mL of 0.25% Marcaine with epinephrine.  EBL:  Minimal.  SPECIMEN:  Right thyroid lobe, stitch marked superior pole.  INDICATIONS FOR PROCEDURE:  The patient is a very pleasant 66 year old morbidly obese African American female, who was diagnosed with a nodular goiter several years ago found in the hospital for another medical issue.  She thinks that it has enlarged over the past several years. She states that it is now more noticeable on the right.  She underwent ultrasound evaluation, which demonstrated the right lobe measuring 5.5 x 3.3 x 4.4 cm.  There is a dominant solid nodule in the right lobe measuring 4.1 x 2.2 x 4.3 cm.  There are no calcifications. She did undergo FNA biopsy which revealed a benign goiter.  She was euthyroid. However, despite biopsy results, I did recommend a right-sided thyroid lobectomy based on the sheer size of the lesion.  We discussed at length the risks and benefits of surgery including, but not limited to bleeding, infection, neck hematoma, injury to the surrounding parathyroid glands or injury to the recurrent laryngeal nerve, scarring, or hoarseness of voice, blood clot formation, anesthesia risk, cardiovascular risk.  Potential for reoperative surgery depending on pathology report, the potential need for calcium or thyroid supplementation, and we also discussed typical  postoperative course. She elected to proceed to the operating room.  DESCRIPTION OF PROCEDURE:  The patient was identified in the holding area with the patient confirming the right side.  I marked my initials on the right side of her neck.  She was then taken to operating room 17 to [facility].  She was placed supine on the operating table. General endotracheal anesthesia was established.  Her arms were tucked by her sides with appropriate padding.  A shoulder roll was placed between her shoulder blades, and her neck was slightly hyperextended. Her neck and upper chest were prepped and draped in usual standard surgical fashion with ChloraPrep.  She received 2 g of Ancef prior to skin incision.  I palpated her sternal notch, her thyroid cartilage, and cricoid and thyroid cartilage.  I then made a 6 cm Kocher incision about 1 fingerbreadth above her sternal notch with a 15 blade.  The subcutaneous tissue was divided with electrocautery.  Platysma was divided with electrocautery.  I then raised superior and inferior skin flaps down to the level of the sternal notch, and to the level of thyroid cartilage.  Self-retraining retractors were placed.  The median raphae were divided with electrocautery.  I then mobilized the strap muscles off the right lobe of the thyroid.  The dissection was taken lateral to  the carotid artery.  I then took down the superior pole of the thyroid, the right lobe of the thyroid in sequential fashion using a right angle as well as clips and the Harmonic Scalpel.  The dissection stayed right on top of the superior pole of the thyroid.  I then mobilized some of the avascular tissue from the superior medial aspect of the right lobe of the thyroid.  It was all done, I turned my attention inferiorly at the base of the right lobe.  We identified the right inferior parathyroid, it was isolated and protected and dissected away from the right lobe of the thyroid with  minimal trauma.  Once I had taken down the superior pole, I was able to roll the thyroid medially toward the midline.  The dissection stayed right next to the thyroid. The middle thyroid vein was identified and ligated with clips and proximally and right next to the thyroid with harmonic scalpel.  We continued to roll the right lobe of the thyroid medially and was able to mobilize it off the cartilage of the trachea with electrocautery.  The recurrent laryngeal nerve was identified and dissection was carried well away from the nerve.  I was able to completely medialize the right lobe of the thyroid and mobilized the isthmus.  The isthmus was then amputated with the harmonic scalpel.  The 4 x 4 gauze was placed in the right neck.  This stitch was used to mark the superior pole.  There was a firm nodule in the midbody of the thyroid more toward the superior pole.  It was passed off the field as specimen.  The 4 x 4 was removed. The right neck was inspected.  It was irrigated, there was no evidence of bleeding.  Several thin strips of  fibrillar were placed in the right neck after I confirmed excellent hemostasis.  The strap muscles were then reapproximated in the midline with interrupted 3-0 Vicryl sutures. I placed local at this point, the subcutaneous tissue.  The platysma was then reapproximated with inverted interrupted deep 3-0 Vicryl sutures. The skin was reapproximated with 4-0 Monocryl in subcuticular fashion followed by application of benzoin, Steri-Strips, 4 x 4, and Tegaderm. The patient was extubated and taken to recovery in stable condition. There were no immediate complications.  The patient tolerated the procedure well.  All needle, instrument, sponge counts were correct x2. The patient will be staying overnight for observation.     Leighton Ruff. Redmond Pulling, MD, FACS     EMW/MEDQ  D:  07/27/2011  T:  07/27/2011  Job:  [id number]  cc:   Katina Degree, M.D.

## 2011-07-28 NOTE — Anesthesia Postprocedure Evaluation (Signed)
  Anesthesia Post-op Note  Patient: [name]  Procedure(s) Performed: Procedure(s) (LRB): THYROID LOBECTOMY (Right)  Patient Location: PACU  Anesthesia Type: General  Level of Consciousness: awake and alert   Airway and Oxygen Therapy: Patient Spontanous Breathing and Patient connected to nasal cannula oxygen  Post-op Pain: mild  Post-op Assessment: Post-op Vital signs reviewed and Patient's Cardiovascular Status Stable  Post-op Vital Signs: stable  Complications: No apparent anesthesia complications

## 2011-07-28 NOTE — Discharge Instructions (Signed)
[facility], [place] [phone]  THYROID/ PARATHYROID SURGERY: POST OP INSTRUCTIONS  Always review your discharge instruction sheet given to you by the facility where your surgery was performed.  IF YOU HAVE DISABILITY OR FAMILY LEAVE FORMS, YOU MUST BRING THEM TO THE OFFICE FOR PROCESSING.  PLEASE DO NOT GIVE THEM TO YOUR DOCTOR.  DO NOT START YOUR ASPIRIN UNTIL FRIDAY  1. A prescription for pain medication may be given to you upon discharge.  Take your pain medication as prescribed, if needed.  If narcotic pain medicine is not needed, then you may take acetaminophen (Tylenol) or ibuprofen (Advil) as needed. 2. Take your usually prescribed medications unless otherwise directed. 3. If you need a refill on your pain medication, please contact your pharmacy. They will contact our office to request authorization.  Prescriptions will not be filled after 5pm or on week-ends. 4. You should follow a light diet the first 24 hours after arrival home, such as soup and crackers, etc.  Be sure to include lots of fluids daily.  Resume your normal diet the day after surgery. 5. Most patients will experience some swelling and bruising on the chest and neck area.  Ice packs will help.  Swelling and bruising can take several days to resolve.  6. It is common to experience some constipation if taking pain medication after surgery.  Increasing fluid intake and taking a stool softener will usually help or prevent this problem from occurring.  A mild laxative (Milk of Magnesia or Miralax) should be taken according to package directions if there are no bowel movements after 48 hours. 7. Unless discharge instructions indicate otherwise, you may remove your bandages 48 hours after surgery, and you may shower at that time.  You may have steri-strips (small skin tapes) in place directly over the incision.  These strips should be left on the skin for 7-10 days.  . 8. ACTIVITIES:  You may resume regular  (light) daily activities beginning the next day--such as daily self-care, walking, climbing stairs--gradually increasing activities as tolerated.  You may have sexual intercourse when it is comfortable.  Refrain from any heavy lifting or straining until approved by your doctor. a. You may drive when you no longer are taking prescription pain medication, you can comfortably wear a seatbelt, and you can safely maneuver your car and apply brakes b. RETURN TO WORK: NA 9. You should see your doctor in the office for a follow-up appointment approximately two weeks after your surgery.  Make sure that you call for this appointment within a day or two after you arrive home to insure a convenient appointment time. OTHER INSTRUCTIONS: _  WHEN TO CALL YOUR DOCTOR: 1. Fever over 101.0 2. Inability to urinate 3. Nausea and/or vomiting 4. Extreme swelling or bruising in neck around incision 5. Continued bleeding from incision. 6. Increased pain, redness, or drainage from the incision. 7. Difficulty swallowing or breathing 8. Muscle cramping or spasms. 9. Numbness or tingling in hands or feet or around lips  The clinic staff is available to answer your questions during regular business hours.  Please don't hesitate to call and ask to speak to one of the nurses if you have concerns.  For further questions, please visit [email or web address]

## 2011-07-28 NOTE — Discharge Summary (Signed)
Physician Discharge Summary  Patient ID: [name] MRN: [id number] DOB/AGE: 65-20-1947 65 y.o.  Admit date: 07/27/2011 Discharge date: 07/28/2011  Admission Diagnoses: Patient Active Problem List  Diagnoses  . DIABETES MELLITUS  . OVERWEIGHT/OBESITY  . HYPERTENSION, UNSPECIFIED  . CARDIOMYOPATHY, SECONDARY  . SYSTOLIC HEART FAILURE, CHRONIC  . PALPITATIONS, OCCASIONAL  . GASTROPARESIS  . Multinodular non-toxic goiter - Right dominant lobe nodule    Discharge Diagnoses:  Principal Problem:  *S/P partial thyroidectomy -Right Active Problems:  DIABETES MELLITUS  HYPERTENSION, UNSPECIFIED  Multinodular non-toxic goiter   Discharged Condition: good  Hospital Course: The patient was taken to the operating room on May 7 for a Right thyroid lobectomy with isthmusectomy for a dominant right sided nodule. Her postoperative course was unremarkable. On POD 1 she was tolerating a diet, her pain was controlled. Her incision was c/d/i without any swelling. She had no difficulty breathing. Her voice was unchanged. She just complained of a sore throat. She had no numbness or tingling around her mouth and no muscle cramps. Her Ca remained >9.  Consults: None  Significant Diagnostic Studies: labs: Ca 9.5-->9.4; hgb 8.1  Treatments: IV hydration, analgesia: Vicodin and Morphine and surgery: RIGHT THYROID LOBECTOMY WITH ISTHMUSECTOMY  Discharge Exam: Blood pressure 105/47, pulse 70, temperature 97.8 F (36.6 C), temperature source Axillary, resp. rate 16, height 5\' 4"  (1.626 m), weight 268 lb 15.4 oz (122 kg), SpO2 95.00%. Alert, nad cta b/l RRR Obese, soft, nt Neck - dressing c/d/i. No swelling Voice sounds normal  Disposition:   Discharge Orders    Future Appointments: Provider: Department: Dept Phone: Center:   08/25/2011 9:15 AM Gayland Curry, MD,FACS [facility] [phone] None   09/13/2011 9:00 AM Evans Lance, MD [facility] [phone]     Future Orders Please Complete By Expires   Diet - low sodium heart healthy - diabetic diet     Increase activity slowly        Medication List  As of 07/28/2011  8:32 AM   TAKE these medications         aspirin 81 MG tablet   Take 81 mg by mouth daily.      carvedilol 25 MG tablet   Commonly known as: COREG   Take 1 tablet (25 mg total) by mouth 2 (two) times daily.      CINNAMON PO   Take 2 capsules by mouth daily.      digoxin 0.125 MG tablet   Commonly known as: LANOXIN   Take 125 mcg by mouth daily.      DSS 100 MG Caps   Take 100 mg by mouth 2 (two) times daily.      ferrous fumarate 325 (106 FE) MG Tabs   Commonly known as: HEMOCYTE - 106 mg FE   Take 1 tablet by mouth 2 (two) times a week.      FLAX SEED OIL PO   Take 1 capsule by mouth daily.      furosemide 40 MG tablet   Commonly known as: LASIX   Take 40 mg by mouth as needed.      glipiZIDE 5 MG tablet   Commonly known as: GLUCOTROL   Take 5 mg by mouth daily.      HYDROcodone-acetaminophen 5-325 MG per tablet   Commonly known as: NORCO   Take 1-2 tablets by mouth every 4 (four) hours as needed.      l-methylfolate-B6-B12 3-35-2 MG Tabs   Commonly known as: METANX  Take 1 tablet by mouth daily.      metFORMIN 1000 MG tablet   Commonly known as: GLUCOPHAGE   Take 1,000 mg by mouth 2 (two) times daily.      pantoprazole 40 MG tablet   Commonly known as: PROTONIX   Take 40 mg by mouth as needed.      spironolactone 25 MG tablet   Commonly known as: ALDACTONE   Take 1 tablet (25 mg total) by mouth daily.      valsartan 160 MG tablet   Commonly known as: DIOVAN   Take 160 mg by mouth daily.      Vitamin D3 1000 UNITS Caps   Take 2 capsules by mouth daily.           Follow-up Information    Follow up with Gayland Curry, MD,FACS on 08/25/2011. (9:00 am)    Contact information:   [facility], [place], [place] Redmond Pulling, MD, FACS General, Bariatric, & Minimally Invasive Surgery [facility], [place]   Signed: Gayland Curry 07/28/2011, 8:32 AM

## 2011-08-02 ENCOUNTER — Telehealth (INDEPENDENT_AMBULATORY_CARE_PROVIDER_SITE_OTHER): Payer: Self-pay

## 2011-08-02 NOTE — Telephone Encounter (Signed)
Called pt to notify her that her path report is benign per Dr Redmond Pulling.

## 2011-08-03 ENCOUNTER — Encounter (INDEPENDENT_AMBULATORY_CARE_PROVIDER_SITE_OTHER): Payer: Self-pay

## 2011-08-25 ENCOUNTER — Ambulatory Visit (INDEPENDENT_AMBULATORY_CARE_PROVIDER_SITE_OTHER): Payer: Medicaid Other | Admitting: General Surgery

## 2011-08-25 ENCOUNTER — Encounter (INDEPENDENT_AMBULATORY_CARE_PROVIDER_SITE_OTHER): Payer: Self-pay | Admitting: General Surgery

## 2011-08-25 VITALS — BP 98/70 | HR 70 | Temp 98.0°F | Resp 16 | Ht 64.0 in | Wt 264.4 lb

## 2011-08-25 DIAGNOSIS — Z9889 Other specified postprocedural states: Secondary | ICD-10-CM

## 2011-08-25 DIAGNOSIS — E890 Postprocedural hypothyroidism: Secondary | ICD-10-CM

## 2011-08-25 NOTE — Progress Notes (Signed)
Chief complaint: Postop  Procedure: Status post right thyroid lobectomy with isthmusectomy Jul 27, 2011  History of Present Ilness: 66 year old [place] American female comes in for her first postoperative visit. She stayed in the hospital overnight. She states that she has done well since discharge. She states that she never took a pain pill. She denies any fevers or chills. She denies any trouble with her incision. She denies any trouble swallowing. She denies any perioral numbness or tingling. She reports more energy since having surgery. She denies any diarrhea or constipation. She reports her voice is unchanged.  Physical Exam: BP 98/70  Pulse 70  Temp(Src) 98 F (36.7 C) (Temporal)  Resp 16  Ht 5\' 4"  (1.626 m)  Wt 264 lb 6.4 oz (119.931 kg)  BMI 45.38 kg/m2  Gen: alert, NAD, non-toxic appearing Pupils: equal, no scleral icterus Pulm: Lungs clear to auscultation, symmetric chest rise CV: regular rate and rhythm Neck: well healed Kocher incision, no cellulitis. No hematoma  Pathology: Follicular adenoma, 4.5 cm, no atypia or malignancy  Assessment and Plan: 66 year old African American female status post right thyroid lobectomy and isthmusectomy for a large benign follicular adenoma.  She appears to be doing quite well. I suggested that she consider wearing some sunscreen her incision over the next month in order to minimize scarring.  We will check her thyroid function tests. She'll need a neck ultrasound at the end of the year to followup on her left lobe of the thyroid since she had some small nodules on the left side as well.  Followup 3-4 months  Avaiyah Strubel M. Redmond Pulling, MD, FACS General, Bariatric, & Minimally Invasive Surgery [facility], [place]

## 2011-08-25 NOTE — Patient Instructions (Signed)
Consider placing sunscreen on your neck incision for the next month or two Get your blood work done

## 2011-09-13 ENCOUNTER — Encounter: Payer: Self-pay | Admitting: Internal Medicine

## 2011-09-13 ENCOUNTER — Ambulatory Visit (INDEPENDENT_AMBULATORY_CARE_PROVIDER_SITE_OTHER): Payer: PRIVATE HEALTH INSURANCE | Admitting: Internal Medicine

## 2011-09-13 VITALS — BP 124/79 | HR 63 | Resp 18 | Ht 64.0 in | Wt 260.0 lb

## 2011-09-13 DIAGNOSIS — I5022 Chronic systolic (congestive) heart failure: Secondary | ICD-10-CM

## 2011-09-13 DIAGNOSIS — I10 Essential (primary) hypertension: Secondary | ICD-10-CM

## 2011-09-13 NOTE — Assessment & Plan Note (Signed)
Her blood pressure is well controlled. I've encouraged the patient to increase her physical activity, exercise daily, and maintain a low-sodium diet. She has lost over 50 pounds in the last 5 or 6 years, and asked the patient to continue with her program of weight loss.

## 2011-09-13 NOTE — Assessment & Plan Note (Signed)
Her symptoms are well controlled, class I. Today, I have asked her to stop digoxin. I think is of little benefit. She is instructed to continue with her weight loss, and maintain a low-sodium diet. She will continue her other medical therapies.

## 2011-09-13 NOTE — Progress Notes (Signed)
HPI [name] returns today for followup. She is a very pleasant 40 old woman with a nonischemic cardiomyopathy, chronic systolic heart failure with improvement in left ventricular function, status post remote ICD implantation, complicated by dislodgment of her defibrillator lead x2, status post multiple ICD shocks secondary to the dislodgment. She has been stable after removal of her device. Her ejection fraction has been somewhat variable from a low of 20-25% many years ago, to a normalization over a year ago. Repeat 2-D echo obtained several months ago demonstrates mild left ventricular dysfunction with ejection fraction of 45%. In the interim, the patient has done well. She denies chest pain, shortness of breath, syncope, or weakness. No Known Allergies   Current Outpatient Prescriptions  Medication Sig Dispense Refill  . aspirin 81 MG tablet Take 81 mg by mouth daily.        . carvedilol (COREG) 25 MG tablet Take 1 tablet (25 mg total) by mouth 2 (two) times daily.  60 tablet  11  . Cholecalciferol (VITAMIN D3) 1000 UNITS CAPS Take 1 capsule by mouth daily.       [name] CINNAMON PO Take 2 capsules by mouth daily.       . ferrous fumarate (HEMOCYTE - 106 MG FE) 325 (106 FE) MG TABS Take 1 tablet by mouth 2 (two) times a week.       . Flaxseed, Linseed, (FLAX SEED OIL PO) Take 1 capsule by mouth daily.       . furosemide (LASIX) 40 MG tablet Take 40 mg by mouth as needed.       [name] glipiZIDE (GLUCOTROL) 5 MG tablet Take 5 mg by mouth daily.        [name] l-methylfolate-B6-B12 (METANX) 3-35-2 MG TABS Take 1 tablet by mouth daily.        [name] levothyroxine (SYNTHROID, LEVOTHROID) 50 MCG tablet Take 50 mcg by mouth daily.      . metFORMIN (GLUCOPHAGE) 1000 MG tablet Take 1,000 mg by mouth 2 (two) times daily.        . pantoprazole (PROTONIX) 40 MG tablet Take 40 mg by mouth as needed.       [name] spironolactone (ALDACTONE) 25 MG tablet Take 1 tablet (25 mg total) by mouth daily.  30 tablet  11  . valsartan  (DIOVAN) 160 MG tablet Take 160 mg by mouth daily.           Past Medical History  Diagnosis Date  . Pacemaker   . Systolic heart failure     Chronic  . Overweight   . Cardiomyopathy secondary   . Hypertension     takes Diovan and Spirololactone daily  . Dysrhythmia     pt takes Digoxin and Carvedilol daily  . CHF (congestive heart failure)     takes Lasix prn  . Sleep apnea     doesn't use CPAP  . Pneumonia     hx of;many yrs ago  . Peripheral neuropathy   . Arthritis     ankles  . Gout     hx of x 1 time in Aug 2012  . GERD (gastroesophageal reflux disease)     takes Protonix seldom  . H/O hiatal hernia   . Hemorrhoid   . Anemia     takes Ferrous Fumarate 2 times week  . Blood transfusion     hx of-22yrs ago  . Thyroid nodule     right  . Diabetes mellitus     takes Glipizide and Metformin  daily  . Cataract     bilateral and immature    ROS:   All systems reviewed and negative except as noted in the HPI.   Past Surgical History  Procedure Date  . Cardiac defibrillator placement >73yrs ago  . Cardiac defirillator removed     removed <44months;removed d/t shocking pt 15times;pierced sac of heart 1000cc ;drained  . Stomach stapling surgery 80's  . Appendectomy 1991  . Tummy tuck 1991  . Leg lipoma   . Abdominal hysterectomy 1991  . Tubal ligation 1971  . Ovarian cyst removed 1991  . Cholecystectomy open 1992  . Colonoscopy   . Thyroid lobectomy 07/27/2011    Procedure: THYROID LOBECTOMY;  Surgeon: Gayland Curry, MD,FACS;  Location: [place];  Service: General;  Laterality: Right;     Family History  Problem Relation Age of Onset  . Anesthesia problems Neg Hx   . Hypotension Neg Hx   . Malignant hyperthermia Neg Hx   . Pseudochol deficiency Neg Hx      History   Social History  . Marital Status: Widowed    Spouse Name: N/A    Number of Children: N/A  . Years of Education: N/A   Occupational History  . Not on file.   Social History Main  Topics  . Smoking status: Never Smoker   . Smokeless tobacco: Not on file  . Alcohol Use: Yes     glass wine occasionally  . Drug Use: No  . Sexually Active: Yes    Birth Control/ Protection: Surgical   Other Topics Concern  . Not on file   Social History Narrative  . No narrative on file     BP 124/79  Pulse 63  Resp 18  Ht 5\' 4"  (1.626 m)  Wt 260 lb (117.935 kg)  BMI 44.63 kg/m2  Physical Exam:  Well appearing obese, middle-age woman, NAD HEENT: Unremarkable Neck:  No JVD, no thyromegally Lungs:  Clear with no wheezes, rales, or rhonchi. HEART:  Regular rate rhythm, no murmurs, no rubs, no clicks Abd:  soft, positive bowel sounds, no organomegally, no rebound, no guarding Ext:  2 plus pulses, no edema, no cyanosis, no clubbing Skin:  No rashes no nodules Neuro:  CN II through XII intact, motor grossly intact  Assess/Plan:

## 2011-09-13 NOTE — Patient Instructions (Addendum)
Your physician recommends that you schedule a follow-up appointment in: 1 year  Your physician has recommended you make the following change in your medication: Stop Digoxin and contact us if you have any adverse effects

## 2011-11-18 ENCOUNTER — Ambulatory Visit (INDEPENDENT_AMBULATORY_CARE_PROVIDER_SITE_OTHER): Payer: PRIVATE HEALTH INSURANCE | Admitting: General Surgery

## 2011-11-18 ENCOUNTER — Encounter (INDEPENDENT_AMBULATORY_CARE_PROVIDER_SITE_OTHER): Payer: Self-pay | Admitting: General Surgery

## 2011-11-18 VITALS — BP 120/82 | HR 64 | Temp 97.2°F | Resp 16 | Ht 64.0 in | Wt 270.4 lb

## 2011-11-18 DIAGNOSIS — E042 Nontoxic multinodular goiter: Secondary | ICD-10-CM

## 2011-11-18 NOTE — Patient Instructions (Signed)
Please get your labs drawn as ordered

## 2011-11-18 NOTE — Progress Notes (Signed)
Subjective:     Patient ID: [name], female   DOB: November 21, 1945, 66 y.o.   MRN: [id number]  HPI 66 year old Afro-American female comes in for long-term followup after undergoing a right thyroid lobectomy with isthmusectomy on May 7 for a large follicular adenoma. I last saw her on June 5. She states that she is doing well. She denies any new medical problems or issues. She states that she is recently return to singing in her choir. She states that her voice doesn't project as much. She had this problem prior to surgery. She also states that her voice will tire-out but this happened pre-op as well. She denies any difficulty swallowing. She denies any fever or chills. She denies any diarrhea or constipation. She denies any palpitation. She denies any mood changes. She is currently taking 50 mcg of Synthroid per day. She did not have her thyroid levels checked after her last office visit as requested. PMHx, PSHx, SOCHx, FAMHx, ALL reviewed  Review of Systems 8 point ROS negative except for what is mentioned above    Objective:   Physical Exam BP 120/82  Pulse 64  Temp 97.2 F (36.2 C) (Temporal)  Resp 16  Ht 5\' 4"  (1.626 m)  Wt 270 lb 6.4 oz (122.653 kg)  BMI 46.41 kg/m2  Gen: alert, NAD, non-toxic appearing Pupils: equal, no scleral icterus Neck: well healed kocher incision, no LAD. Trachea midline. No thyromegaly Pulm: Lungs clear to auscultation, symmetric chest rise CV: regular rate and rhythm Ext: no edema, no calf tenderness Skin: no rash, no jaundice     Assessment:     Status post right thyroid lobectomy and isthmusectomy for large thyroid follicular adenoma    Plan:     She appears to be doing quite well. We discussed her voice. She had this issue prior to surgical exploration. As we discussed preoperatively, I explained to her that her voice would not improve by removing part of the thyroid lobe. My goal was not to make her voice any worse. I encouraged her to get  her thyroid level checked. She states that she will get it checked in [place].. Followup 6 months with neck ultrasound  Leighton Ruff. Redmond Pulling, MD, FACS General, Bariatric, & Minimally Invasive Surgery [facility], [place]

## 2011-11-24 LAB — BASIC METABOLIC PANEL
BUN: 39 mg/dL — ABNORMAL HIGH (ref 6–23)
CO2: 22 mEq/L (ref 19–32)
Chloride: 107 mEq/L (ref 96–112)
Creat: 1.2 mg/dL — ABNORMAL HIGH (ref 0.50–1.10)

## 2012-01-12 ENCOUNTER — Emergency Department (HOSPITAL_COMMUNITY)
Admission: EM | Admit: 2012-01-12 | Discharge: 2012-01-12 | Disposition: A | Discharge status: Home / Self Care | Payer: PRIVATE HEALTH INSURANCE | Attending: Emergency Medicine | Admitting: Emergency Medicine

## 2012-01-12 ENCOUNTER — Encounter (HOSPITAL_COMMUNITY): Payer: Self-pay | Admitting: *Deleted

## 2012-01-12 DIAGNOSIS — Z8701 Personal history of pneumonia (recurrent): Secondary | ICD-10-CM | POA: Insufficient documentation

## 2012-01-12 DIAGNOSIS — E041 Nontoxic single thyroid nodule: Secondary | ICD-10-CM | POA: Insufficient documentation

## 2012-01-12 DIAGNOSIS — H269 Unspecified cataract: Secondary | ICD-10-CM | POA: Insufficient documentation

## 2012-01-12 DIAGNOSIS — I429 Cardiomyopathy, unspecified: Secondary | ICD-10-CM | POA: Insufficient documentation

## 2012-01-12 DIAGNOSIS — D649 Anemia, unspecified: Secondary | ICD-10-CM | POA: Insufficient documentation

## 2012-01-12 DIAGNOSIS — I499 Cardiac arrhythmia, unspecified: Secondary | ICD-10-CM | POA: Insufficient documentation

## 2012-01-12 DIAGNOSIS — Z7982 Long term (current) use of aspirin: Secondary | ICD-10-CM | POA: Insufficient documentation

## 2012-01-12 DIAGNOSIS — R5381 Other malaise: Secondary | ICD-10-CM | POA: Insufficient documentation

## 2012-01-12 DIAGNOSIS — Z79899 Other long term (current) drug therapy: Secondary | ICD-10-CM | POA: Insufficient documentation

## 2012-01-12 DIAGNOSIS — E162 Hypoglycemia, unspecified: Secondary | ICD-10-CM | POA: Insufficient documentation

## 2012-01-12 DIAGNOSIS — R531 Weakness: Secondary | ICD-10-CM

## 2012-01-12 DIAGNOSIS — R739 Hyperglycemia, unspecified: Secondary | ICD-10-CM

## 2012-01-12 DIAGNOSIS — I509 Heart failure, unspecified: Secondary | ICD-10-CM | POA: Insufficient documentation

## 2012-01-12 DIAGNOSIS — R079 Chest pain, unspecified: Secondary | ICD-10-CM | POA: Insufficient documentation

## 2012-01-12 DIAGNOSIS — I10 Essential (primary) hypertension: Secondary | ICD-10-CM | POA: Insufficient documentation

## 2012-01-12 DIAGNOSIS — I5022 Chronic systolic (congestive) heart failure: Secondary | ICD-10-CM | POA: Insufficient documentation

## 2012-01-12 DIAGNOSIS — E119 Type 2 diabetes mellitus without complications: Secondary | ICD-10-CM | POA: Insufficient documentation

## 2012-01-12 DIAGNOSIS — E669 Obesity, unspecified: Secondary | ICD-10-CM | POA: Insufficient documentation

## 2012-01-12 DIAGNOSIS — K219 Gastro-esophageal reflux disease without esophagitis: Secondary | ICD-10-CM | POA: Insufficient documentation

## 2012-01-12 DIAGNOSIS — Z8719 Personal history of other diseases of the digestive system: Secondary | ICD-10-CM | POA: Insufficient documentation

## 2012-01-12 DIAGNOSIS — R5383 Other fatigue: Secondary | ICD-10-CM | POA: Insufficient documentation

## 2012-01-12 LAB — URINALYSIS, ROUTINE W REFLEX MICROSCOPIC
Bilirubin Urine: NEGATIVE
Nitrite: NEGATIVE
Specific Gravity, Urine: 1.02 (ref 1.005–1.030)
Urobilinogen, UA: 0.2 mg/dL (ref 0.0–1.0)
pH: 6 (ref 5.0–8.0)

## 2012-01-12 LAB — BASIC METABOLIC PANEL
BUN: 32 mg/dL — ABNORMAL HIGH (ref 6–23)
CO2: 25 mEq/L (ref 19–32)
Calcium: 10.4 mg/dL (ref 8.4–10.5)
Creatinine, Ser: 1.36 mg/dL — ABNORMAL HIGH (ref 0.50–1.10)
Glucose, Bld: 147 mg/dL — ABNORMAL HIGH (ref 70–99)

## 2012-01-12 LAB — CBC WITH DIFFERENTIAL/PLATELET
Basophils Absolute: 0 10*3/uL (ref 0.0–0.1)
Eosinophils Relative: 1 % (ref 0–5)
HCT: 30.2 % — ABNORMAL LOW (ref 36.0–46.0)
Hemoglobin: 9.4 g/dL — ABNORMAL LOW (ref 12.0–15.0)
Lymphocytes Relative: 25 % (ref 12–46)
MCV: 78 fL (ref 78.0–100.0)
Monocytes Absolute: 0.1 10*3/uL (ref 0.1–1.0)
Monocytes Relative: 4 % (ref 3–12)
RDW: 17.1 % — ABNORMAL HIGH (ref 11.5–15.5)
WBC: 3.3 10*3/uL — ABNORMAL LOW (ref 4.0–10.5)

## 2012-01-12 LAB — GLUCOSE, CAPILLARY: Glucose-Capillary: 188 mg/dL — ABNORMAL HIGH (ref 70–99)

## 2012-01-12 LAB — URINE MICROSCOPIC-ADD ON

## 2012-01-12 LAB — TROPONIN I: Troponin I: <0.3 ng/mL (ref <0.30)

## 2012-01-12 NOTE — ED Notes (Signed)
Notified Dr. Tomi Bamberger that patient ambulated with minor assistance and walked well while complaining of slight dizziness.

## 2012-01-12 NOTE — ED Notes (Signed)
Patient ambulatory to restroom, family with patient at this time.

## 2012-01-12 NOTE — ED Provider Notes (Cosign Needed)
History    This chart was scribed for Victoria Norrie, MD, MD by [name]. The patient was seen in room APA02 and the patient's care was started at 5:05PM.   CSN: [id number]  Arrival date & time 01/12/12  1603       Chief Complaint  Patient presents with  . Chest Pain    (Consider location/radiation/quality/duration/timing/severity/associated sxs/prior treatment) Patient is a 66 y.o. female presenting with chest pain. The history is provided by the patient. No language interpreter was used.  Chest Pain  Associated symptoms include diaphoresis and weakness.    [name] is a 66 y.o. female with hx of DM, HTN, who presents to the Emergency Department complaining of constant, moderate weakness onset today around 3PM. Pt reports sudden onset. She reports that she was talking to her god daughter when symptoms started. God daughter reports patients eyes rolled back and her mouth seemed white. Pt denies LOC. Pt became clammy and she had diaphoresis. She reports this episode lasted 15 minutes. Denies headache. She had tsp of sugar and orange juice because she thought her glucose was low. She reports vomiting after drinking orange juice. She states her chest felt heavy after vomiting. She states that she had chest discomfort upon arrival to ED but it has subsided while she is lying down. Pt's CBG was 86 this AM and it was 186 when she checked it at home after having orange juice. She reports CBG is under 100 in the AM. She had small breakfast this morning and denies eating lunch. Denies diaphoresis currently. Reports taking her DM medications this AM. Pt reports having similar episode in the past but symptoms subsided after eating. Denies smoking cigarettes. Reports rarely drinking wine.   PCP is Dr. French Ana Pt goes to Dr. Crissie Sickles at [facility]    Past Medical History  Diagnosis Date  . Systolic heart failure     Chronic  . Overweight   . Cardiomyopathy secondary   . Hypertension      takes Diovan and Spirololactone daily  . Dysrhythmia     pt takes Digoxin and Carvedilol daily  . CHF (congestive heart failure)     takes Lasix prn  . Sleep apnea     doesn't use CPAP  . Pneumonia     hx of;many yrs ago  . Peripheral neuropathy   . Arthritis     ankles  . Gout     hx of x 1 time in Aug 2012  . GERD (gastroesophageal reflux disease)     takes Protonix seldom  . H/O hiatal hernia   . Hemorrhoid   . Anemia     takes Ferrous Fumarate 2 times week  . Blood transfusion     hx of-25yrs ago  . Thyroid nodule     right  . Diabetes mellitus     takes Glipizide and Metformin daily  . Cataract     bilateral and immature    Past Surgical History  Procedure Date  . Cardiac defirillator removed     removed <65months;removed d/t shocking pt 15times;pierced sac of heart 1000cc ;drained  . Stomach stapling surgery 80's  . Appendectomy 1991  . Tummy tuck 1991  . Leg lipoma   . Abdominal hysterectomy 1991  . Tubal ligation 1971  . Ovarian cyst removed 1991  . Cholecystectomy open 1992  . Colonoscopy   . Thyroid lobectomy 07/27/2011    Procedure: THYROID LOBECTOMY;  Surgeon: Gayland Curry, MD,FACS;  Location: [facility];  Service: General;  Laterality: Right;  . Cardiac defibrillator placement >1yrs ago  . Defibrillator removed     Family History  Problem Relation Age of Onset  . Anesthesia problems Neg Hx   . Hypotension Neg Hx   . Malignant hyperthermia Neg Hx   . Pseudochol deficiency Neg Hx     History  Substance Use Topics  . Smoking status: Never Smoker   . Smokeless tobacco: Not on file  . Alcohol Use: Yes     glass wine occasionally  Lives at home Lives alone  OB History    Grav Para Term Preterm Abortions TAB SAB Ect Mult Living                  Review of Systems  Constitutional: Positive for diaphoresis.  Cardiovascular: Positive for chest pain.  Neurological: Positive for weakness.  All other systems reviewed and are  negative.    Allergies  Review of patient's allergies indicates no known allergies.  Home Medications   Current Outpatient Rx  Name Route Sig Dispense Refill  . ACCU-CHEK SMARTVIEW VI STRP      . ASPIRIN 81 MG PO TABS Oral Take 81 mg by mouth daily.      [name] CARVEDILOL 25 MG PO TABS Oral Take 1 tablet (25 mg total) by mouth 2 (two) times daily. 60 tablet 11  . VITAMIN D3 1000 UNITS PO CAPS Oral Take 1 capsule by mouth daily.     [name] CINNAMON PO Oral Take 2 capsules by mouth daily.     [name] FERROUS FUMARATE 325 (106 FE) MG PO TABS Oral Take 1 tablet by mouth 2 (two) times a week.     [name] FLAX SEED OIL PO Oral Take 1 capsule by mouth daily.     . FUROSEMIDE 40 MG PO TABS Oral Take 40 mg by mouth as needed.     [name] GLIPIZIDE 5 MG PO TABS Oral Take 5 mg by mouth daily.      . L-METHYLFOLATE-B6-B12 3-35-2 MG PO TABS Oral Take 1 tablet by mouth daily.      [name] LEVOTHYROXINE SODIUM 50 MCG PO TABS Oral Take 50 mcg by mouth daily.    [name] METFORMIN HCL 1000 MG PO TABS Oral Take 1,000 mg by mouth 2 (two) times daily.      [name] PANTOPRAZOLE SODIUM 40 MG PO TBEC Oral Take 40 mg by mouth as needed.     [name] SPIRONOLACTONE 25 MG PO TABS Oral Take 1 tablet (25 mg total) by mouth daily. 30 tablet 11  . VALSARTAN 160 MG PO TABS Oral Take 160 mg by mouth daily.        BP 110/58  Temp 97.5 F (36.4 C) (Oral)  Resp 20  Ht 5\' 4"  (1.626 m)  Wt 266 lb (120.657 kg)  BMI 45.66 kg/m2  SpO2 100%  Vital signs normal   Physical Exam  Nursing note and vitals reviewed. Constitutional: She is oriented to person, place, and time. She appears well-developed and well-nourished.  Non-toxic appearance. She does not appear ill. No distress.  HENT:  Head: Normocephalic and atraumatic.  Right Ear: External ear normal.  Left Ear: External ear normal.  Nose: Nose normal. No mucosal edema or rhinorrhea.  Mouth/Throat: Oropharynx is clear and moist and mucous membranes are normal. No dental abscesses or uvula swelling.       Tongue  is dry  Eyes: Conjunctivae normal and EOM are normal. Pupils are equal, round,  and reactive to light.       Pale conjunctiva   Neck: Normal range of motion and full passive range of motion without pain. Neck supple.  Cardiovascular: Normal rate, regular rhythm and normal heart sounds.  Exam reveals no gallop and no friction rub.   No murmur heard. Pulmonary/Chest: Effort normal and breath sounds normal. No respiratory distress. She has no wheezes. She has no rhonchi. She has no rales. She exhibits no tenderness and no crepitus.  Abdominal: Soft. Normal appearance and bowel sounds are normal. She exhibits no distension. There is no tenderness. There is no rebound and no guarding.  Musculoskeletal: Normal range of motion. She exhibits no edema and no tenderness.       Moves all extremities well.   Neurological: She is alert and oriented to person, place, and time. She has normal strength. No cranial nerve deficit.  Skin: Skin is warm, dry and intact. No rash noted. No erythema. No pallor.  Psychiatric: She has a normal mood and affect. Her speech is normal and behavior is normal. Her mood appears not anxious.    ED Course  Procedures (including critical care time) DIAGNOSTIC STUDIES: Oxygen Saturation is 100% on room air, normal by my interpretation.    COORDINATION OF CARE: 5:19 PM Discussed ED treatment with pt   Pt ate and ambulated in the ED. States she just feels a little weak.  18:25 Recheck: discussed pt lab results. Pt reports that she has slight improvement of weakness after eating.  2035 Recheck: discussed 2nd set of lab tests with pt.   Results for orders placed during the hospital encounter of 01/12/12  GLUCOSE, CAPILLARY      Component Value Range   Glucose-Capillary 167 (*) 70 - 99 mg/dL   Comment 1 Notify RN    CBC WITH DIFFERENTIAL      Component Value Range   WBC 3.3 (*) 4.0 - 10.5 K/uL   RBC 3.87  3.87 - 5.11 MIL/uL   Hemoglobin 9.4 (*) 12.0 - 15.0 g/dL   HCT 30.2  (*) 36.0 - 46.0 %   MCV 78.0  78.0 - 100.0 fL   MCH 24.3 (*) 26.0 - 34.0 pg   MCHC 31.1  30.0 - 36.0 g/dL   RDW 17.1 (*) 11.5 - 15.5 %   Platelets 150  150 - 400 K/uL   Neutrophils Relative 70  43 - 77 %   Neutro Abs 2.3  1.7 - 7.7 K/uL   Lymphocytes Relative 25  12 - 46 %   Lymphs Abs 0.8  0.7 - 4.0 K/uL   Monocytes Relative 4  3 - 12 %   Monocytes Absolute 0.1  0.1 - 1.0 K/uL   Eosinophils Relative 1  0 - 5 %   Eosinophils Absolute 0.0  0.0 - 0.7 K/uL   Basophils Relative 1  0 - 1 %   Basophils Absolute 0.0  0.0 - 0.1 K/uL  BASIC METABOLIC PANEL      Component Value Range   Sodium 135  135 - 145 mEq/L   Potassium 5.0  3.5 - 5.1 mEq/L   Chloride 98  96 - 112 mEq/L   CO2 25  19 - 32 mEq/L   Glucose, Bld 147 (*) 70 - 99 mg/dL   BUN 32 (*) 6 - 23 mg/dL   Creatinine, Ser 1.36 (*) 0.50 - 1.10 mg/dL   Calcium 10.4  8.4 - 10.5 mg/dL   GFR calc non Af Amer 40 (*) >90  mL/min   GFR calc Af Amer 46 (*) >90 mL/min  TROPONIN I      Component Value Range   Troponin I <0.30  <0.30 ng/mL  TROPONIN I      Component Value Range   Troponin I <0.30  <0.30 ng/mL  GLUCOSE, CAPILLARY      Component Value Range   Glucose-Capillary 188 (*) 70 - 99 mg/dL  URINALYSIS, ROUTINE W REFLEX MICROSCOPIC      Component Value Range   Color, Urine YELLOW  YELLOW   APPearance CLEAR  CLEAR   Specific Gravity, Urine 1.020  1.005 - 1.030   pH 6.0  5.0 - 8.0   Glucose, UA NEGATIVE  NEGATIVE mg/dL   Hgb urine dipstick NEGATIVE  NEGATIVE   Bilirubin Urine NEGATIVE  NEGATIVE   Ketones, ur NEGATIVE  NEGATIVE mg/dL   Protein, ur NEGATIVE  NEGATIVE mg/dL   Urobilinogen, UA 0.2  0.0 - 1.0 mg/dL   Nitrite NEGATIVE  NEGATIVE   Leukocytes, UA SMALL (*) NEGATIVE  URINE MICROSCOPIC-ADD ON      Component Value Range   Squamous Epithelial / LPF MANY (*) RARE   WBC, UA 3-6  <3 WBC/hpf   RBC / HPF 0-2  <3 RBC/hpf   Bacteria, UA MANY (*) RARE   Casts HYALINE CASTS (*) NEGATIVE   Laboratory interpretation all normal  except mild hyperglycemia, stable anemia       Date: 01/12/2012  Rate: 70  Rhythm: normal sinus rhythm and sinus arrhythmia  QRS Axis: normal  Intervals: normal  ST/T Wave abnormalities: normal  Conduction Disutrbances:none  Narrative Interpretation:   Old EKG Reviewed: unchanged from 02/07/2009    1. Episode of generalized weakness   2. Hypoglycemia     Plan discharge  Rolland Porter, MD, FACEP   MDM   I personally performed the services described in this documentation, which was scribed in my presence. The recorded information has been reviewed and considered.    Victoria Norrie, MD 01/12/12 [phone]

## 2012-01-12 NOTE — ED Notes (Signed)
Sudden onset feeling weak, and clammy,  Thought she would pass out, Then began having chest pain. Drank OJ and took a tsp of sugar , but no better,  CBG 186 at home after the orange juice and sugar

## 2012-01-14 LAB — URINE CULTURE: Colony Count: 45000

## 2012-03-01 ENCOUNTER — Other Ambulatory Visit: Payer: Self-pay | Admitting: Internal Medicine

## 2012-03-01 DIAGNOSIS — E049 Nontoxic goiter, unspecified: Secondary | ICD-10-CM

## 2012-03-13 ENCOUNTER — Encounter (HOSPITAL_COMMUNITY): Payer: Self-pay | Admitting: Emergency Medicine

## 2012-03-13 ENCOUNTER — Emergency Department (HOSPITAL_COMMUNITY)
Admission: EM | Admit: 2012-03-13 | Discharge: 2012-03-13 | Disposition: A | Discharge status: Home / Self Care | Payer: PRIVATE HEALTH INSURANCE | Attending: Emergency Medicine | Admitting: Emergency Medicine

## 2012-03-13 DIAGNOSIS — I5022 Chronic systolic (congestive) heart failure: Secondary | ICD-10-CM | POA: Insufficient documentation

## 2012-03-13 DIAGNOSIS — L02212 Cutaneous abscess of back [any part, except buttock]: Secondary | ICD-10-CM

## 2012-03-13 DIAGNOSIS — I429 Cardiomyopathy, unspecified: Secondary | ICD-10-CM | POA: Insufficient documentation

## 2012-03-13 DIAGNOSIS — E041 Nontoxic single thyroid nodule: Secondary | ICD-10-CM | POA: Insufficient documentation

## 2012-03-13 DIAGNOSIS — I10 Essential (primary) hypertension: Secondary | ICD-10-CM | POA: Insufficient documentation

## 2012-03-13 DIAGNOSIS — M109 Gout, unspecified: Secondary | ICD-10-CM | POA: Insufficient documentation

## 2012-03-13 DIAGNOSIS — E663 Overweight: Secondary | ICD-10-CM | POA: Insufficient documentation

## 2012-03-13 DIAGNOSIS — Z79899 Other long term (current) drug therapy: Secondary | ICD-10-CM | POA: Insufficient documentation

## 2012-03-13 DIAGNOSIS — Z8701 Personal history of pneumonia (recurrent): Secondary | ICD-10-CM | POA: Insufficient documentation

## 2012-03-13 DIAGNOSIS — K219 Gastro-esophageal reflux disease without esophagitis: Secondary | ICD-10-CM | POA: Insufficient documentation

## 2012-03-13 DIAGNOSIS — E1149 Type 2 diabetes mellitus with other diabetic neurological complication: Secondary | ICD-10-CM | POA: Insufficient documentation

## 2012-03-13 DIAGNOSIS — Z8669 Personal history of other diseases of the nervous system and sense organs: Secondary | ICD-10-CM | POA: Insufficient documentation

## 2012-03-13 DIAGNOSIS — Z7982 Long term (current) use of aspirin: Secondary | ICD-10-CM | POA: Insufficient documentation

## 2012-03-13 DIAGNOSIS — M1710 Unilateral primary osteoarthritis, unspecified knee: Secondary | ICD-10-CM | POA: Insufficient documentation

## 2012-03-13 DIAGNOSIS — D649 Anemia, unspecified: Secondary | ICD-10-CM | POA: Insufficient documentation

## 2012-03-13 DIAGNOSIS — L02219 Cutaneous abscess of trunk, unspecified: Secondary | ICD-10-CM | POA: Insufficient documentation

## 2012-03-13 DIAGNOSIS — G909 Disorder of the autonomic nervous system, unspecified: Secondary | ICD-10-CM | POA: Insufficient documentation

## 2012-03-13 MED ORDER — DOXYCYCLINE HYCLATE 100 MG PO TABS
100.0000 mg | ORAL_TABLET | Freq: Once | ORAL | Status: AC
  Administered 2012-03-13: 100 mg via ORAL
  Filled 2012-03-13: qty 1

## 2012-03-13 MED ORDER — DOXYCYCLINE HYCLATE 100 MG PO CAPS: 100.0000 mg | ORAL_CAPSULE | Freq: Two times a day (BID) | ORAL | Status: DC

## 2012-03-13 NOTE — ED Provider Notes (Signed)
History     CSN: [id number]  Arrival date & time 03/13/12  [id number]   First MD Initiated Contact with Patient 03/13/12 1958      Chief Complaint  Patient presents with  . Abscess    (Consider location/radiation/quality/duration/timing/severity/associated sxs/prior treatment) HPI Comments: Notes the area has been sore ~ 2 weeks..  No other complaints.  Patient is a 66 y.o. female presenting with abscess. The history is provided by the patient. No language interpreter was used.  Abscess  This is a new problem. Episode onset: 2 weeks ago. The problem has been gradually worsening. The abscess is present on the back. The abscess first occurred at home. Pertinent negatives include no fever. She has received no recent medical care.    Past Medical History  Diagnosis Date  . Systolic heart failure     Chronic  . Overweight   . Cardiomyopathy secondary   . Hypertension     takes Diovan and Spirololactone daily  . Dysrhythmia     pt takes Digoxin and Carvedilol daily  . CHF (congestive heart failure)     takes Lasix prn  . Sleep apnea     doesn't use CPAP  . Pneumonia     hx of;many yrs ago  . Peripheral neuropathy   . Arthritis     ankles  . Gout     hx of x 1 time in Aug 2012  . GERD (gastroesophageal reflux disease)     takes Protonix seldom  . H/O hiatal hernia   . Hemorrhoid   . Anemia     takes Ferrous Fumarate 2 times week  . Blood transfusion     hx of-70yrs ago  . Thyroid nodule     right  . Diabetes mellitus     takes Glipizide and Metformin daily  . Cataract     bilateral and immature    Past Surgical History  Procedure Date  . Cardiac defirillator removed     removed <37months;removed d/t shocking pt 15times;pierced sac of heart 1000cc ;drained  . Stomach stapling surgery 80's  . Appendectomy 1991  . Tummy tuck 1991  . Leg lipoma   . Abdominal hysterectomy 1991  . Tubal ligation 1971  . Ovarian cyst removed 1991  . Cholecystectomy open 1992  .  Colonoscopy   . Thyroid lobectomy 07/27/2011    Procedure: THYROID LOBECTOMY;  Surgeon: Gayland Curry, MD,FACS;  Location: [place];  Service: General;  Laterality: Right;  . Cardiac defibrillator placement >35yrs ago  . Defibrillator removed     Family History  Problem Relation Age of Onset  . Anesthesia problems Neg Hx   . Hypotension Neg Hx   . Malignant hyperthermia Neg Hx   . Pseudochol deficiency Neg Hx     History  Substance Use Topics  . Smoking status: Never Smoker   . Smokeless tobacco: Not on file  . Alcohol Use: Yes     Comment: glass wine occasionally    OB History    Grav Para Term Preterm Abortions TAB SAB Ect Mult Living                  Review of Systems  Constitutional: Negative for fever and chills.  Skin: Positive for wound.  All other systems reviewed and are negative.    Allergies  Review of patient's allergies indicates no known allergies.  Home Medications   Current Outpatient Rx  Name  Route  Sig  Dispense  Refill  .  ASPIRIN 81 MG PO TABS   Oral   Take 81 mg by mouth at bedtime.          [name] CARVEDILOL 25 MG PO TABS   Oral   Take 1 tablet (25 mg total) by mouth 2 (two) times daily.   60 tablet   11   . VITAMIN D3 1000 UNITS PO CAPS   Oral   Take 1 capsule by mouth every evening.          [name] CINNAMON PO   Oral   Take 2 capsules by mouth every morning.          [name] DOCUSATE SODIUM 100 MG PO CAPS   Oral   Take 100 mg by mouth 2 (two) times daily.         [name] DOXYCYCLINE HYCLATE 100 MG PO CAPS   Oral   Take 1 capsule (100 mg total) by mouth 2 (two) times daily.   20 capsule   0   . FERROUS FUMARATE 325 (106 FE) MG PO TABS   Oral   Take 1 tablet by mouth 2 (two) times a week.          [name] FLAX SEED OIL PO   Oral   Take 1 capsule by mouth every morning.          . FUROSEMIDE 40 MG PO TABS   Oral   Take 40 mg by mouth daily as needed. For fluid retention         . GLIPIZIDE 5 MG PO TABS   Oral   Take 5 mg by mouth  every evening.          . L-METHYLFOLATE-B6-B12 3-35-2 MG PO TABS   Oral   Take 1 tablet by mouth at bedtime.          [name] LEVOTHYROXINE SODIUM 50 MCG PO TABS   Oral   Take 50 mcg by mouth every morning.          [name] METFORMIN HCL 1000 MG PO TABS   Oral   Take 1,000 mg by mouth 2 (two) times daily.           [name] PANTOPRAZOLE SODIUM 40 MG PO TBEC   Oral   Take 40 mg by mouth daily as needed. For acid reflux/ GERD         . SPIRONOLACTONE 25 MG PO TABS   Oral   Take 25 mg by mouth every morning.         . TETRAHYDROZOLINE HCL 0.05 % OP SOLN   Both Eyes   Place 1 drop into both eyes daily as needed. For dry eye relief         . VALSARTAN 160 MG PO TABS   Oral   Take 160 mg by mouth at bedtime.            BP 123/73  Pulse 71  Temp 97.8 F (36.6 C) (Oral)  Resp 18  SpO2 100%  Physical Exam  Nursing note and vitals reviewed. Constitutional: She is oriented to person, place, and time. She appears well-developed and well-nourished. No distress.  HENT:  Head: Normocephalic and atraumatic.  Eyes: EOM are normal.  Neck: Normal range of motion.  Cardiovascular: Normal rate and regular rhythm.   Pulmonary/Chest: Effort normal.  Abdominal: Soft. She exhibits no distension. There is no tenderness.  Musculoskeletal: Normal range of motion.       Back:  Neurological: She is alert and oriented  to person, place, and time.  Skin: Skin is warm and dry.  Psychiatric: She has a normal mood and affect. Judgment normal.    ED Course  Procedures (including critical care time)  Labs Reviewed - No data to display No results found.   1. Abscess of back, except buttock       MDM  rx-doxycyline, 20 Warm compresses         Jennye Boroughs, [place] 03/13/12 2031

## 2012-03-13 NOTE — ED Notes (Signed)
Pt presents with wound to lower left back. Pt states "it burst and drained out already".

## 2012-03-14 NOTE — ED Provider Notes (Signed)
Medical screening examination/treatment/procedure(s) were performed by non-physician practitioner and as supervising physician I was immediately available for consultation/collaboration. Rolland Porter, MD, Abram Sander   Janice Norrie, MD 03/14/12 [phone]

## 2012-03-27 ENCOUNTER — Ambulatory Visit
Admission: RE | Admit: 2012-03-27 | Discharge: 2012-03-27 | Disposition: A | Discharge status: Home / Self Care | Payer: PRIVATE HEALTH INSURANCE | Source: Ambulatory Visit | Attending: Internal Medicine | Admitting: Internal Medicine

## 2012-03-27 DIAGNOSIS — E049 Nontoxic goiter, unspecified: Secondary | ICD-10-CM

## 2012-05-17 ENCOUNTER — Ambulatory Visit (INDEPENDENT_AMBULATORY_CARE_PROVIDER_SITE_OTHER): Payer: PRIVATE HEALTH INSURANCE | Admitting: General Surgery

## 2012-06-12 ENCOUNTER — Ambulatory Visit (INDEPENDENT_AMBULATORY_CARE_PROVIDER_SITE_OTHER): Payer: Medicaid Other | Admitting: Adult Health

## 2012-06-12 ENCOUNTER — Encounter: Payer: Self-pay | Admitting: Adult Health

## 2012-06-12 ENCOUNTER — Ambulatory Visit: Payer: PRIVATE HEALTH INSURANCE | Admitting: Cardiology

## 2012-06-12 VITALS — BP 114/60 | HR 86 | Wt 284.0 lb

## 2012-06-12 DIAGNOSIS — I5022 Chronic systolic (congestive) heart failure: Secondary | ICD-10-CM

## 2012-06-12 DIAGNOSIS — E663 Overweight: Secondary | ICD-10-CM

## 2012-06-12 DIAGNOSIS — I10 Essential (primary) hypertension: Secondary | ICD-10-CM

## 2012-06-12 DIAGNOSIS — I429 Cardiomyopathy, unspecified: Secondary | ICD-10-CM

## 2012-06-12 DIAGNOSIS — I428 Other cardiomyopathies: Secondary | ICD-10-CM

## 2012-06-12 MED ORDER — PANTOPRAZOLE SODIUM 40 MG PO TBEC: 40.0000 mg | DELAYED_RELEASE_TABLET | Freq: Every day | ORAL | Status: DC

## 2012-06-12 NOTE — Assessment & Plan Note (Signed)
Excellent control of blood pressure at this time. She has been medically compliant. She is unhappy with weight gain but otherwise she has been doing very well. We will make no changes in her medication regimen at this time. As per Dr. Berdine Addison one week ago.

## 2012-06-12 NOTE — Progress Notes (Signed)
HPI [name] is a pleasant 89 old pt of Dr. Crissie Sickles we are seeing her ongoing assessment and management of nonischemic cardiomyopathy, chronic systolic heart failure with improvement in left ventricular function, status post remote ICD implantation, complicated by dislodgment of her defibrillator lead x2, status post multiple ICD shocks secondary to the dislodgment. She has been stable after removal of her device.     She has had recent removal of thyroid goiter on the right.  Since the surgery the patient has gained approximately 20 pounds. She is followed by Dr. Louanna Raw endocrinologist for diabetes and thyroid disease. Dr. Berdine Addison saw her last week with labs completed that are not available to include TSH and iron studies. She offers no complaints. She has no dyspnea on exertion, chest pain or palpitations. Her only complaint has been weight gain and constipation with addition of iron supplement.  Allergies  Allergen Reactions  . Doxycycline Rash    Current Outpatient Prescriptions  Medication Sig Dispense Refill  . aspirin 81 MG tablet Take 81 mg by mouth at bedtime.       . carvedilol (COREG) 25 MG tablet Take 1 tablet (25 mg total) by mouth 2 (two) times daily.  60 tablet  11  . Cholecalciferol (VITAMIN D3) 1000 UNITS CAPS Take 1 capsule by mouth every evening.       [name] CINNAMON PO Take 2 capsules by mouth every morning.       . docusate sodium (COLACE) 100 MG capsule Take 100 mg by mouth 2 (two) times daily.      . ferrous fumarate (HEMOCYTE - 106 MG FE) 325 (106 FE) MG TABS Take 1 tablet by mouth 2 (two) times a week.       . Flaxseed, Linseed, (FLAX SEED OIL PO) Take 1 capsule by mouth every morning.       . furosemide (LASIX) 40 MG tablet Take 40 mg by mouth daily as needed. For fluid retention      . l-methylfolate-B6-B12 (METANX) 3-35-2 MG TABS Take 1 tablet by mouth at bedtime.       [name] levothyroxine (SYNTHROID, LEVOTHROID) 50 MCG tablet Take 50 mcg by mouth every morning.        . metFORMIN (GLUCOPHAGE) 1000 MG tablet Take 1,000 mg by mouth 2 (two) times daily.        . pantoprazole (PROTONIX) 40 MG tablet Take 40 mg by mouth daily as needed. For acid reflux/ GERD      . spironolactone (ALDACTONE) 25 MG tablet Take 25 mg by mouth every morning.      [name] tetrahydrozoline (EYE DROPS) 0.05 % ophthalmic solution Place 1 drop into both eyes daily as needed. For dry eye relief      . valsartan (DIOVAN) 160 MG tablet Take 160 mg by mouth at bedtime.        No current facility-administered medications for this visit.    Past Medical History  Diagnosis Date  . Systolic heart failure     Chronic  . Overweight   . Cardiomyopathy secondary   . Hypertension     takes Diovan and Spirololactone daily  . Dysrhythmia     pt takes Digoxin and Carvedilol daily  . CHF (congestive heart failure)     takes Lasix prn  . Sleep apnea     doesn't use CPAP  . Pneumonia     hx of;many yrs ago  . Peripheral neuropathy   . Arthritis     ankles  .  Gout     hx of x 1 time in Aug 2012  . GERD (gastroesophageal reflux disease)     takes Protonix seldom  . H/O hiatal hernia   . Hemorrhoid   . Anemia     takes Ferrous Fumarate 2 times week  . Blood transfusion     hx of-33yrs ago  . Thyroid nodule     right  . Diabetes mellitus     takes Glipizide and Metformin daily  . Cataract     bilateral and immature    Past Surgical History  Procedure Laterality Date  . Cardiac defirillator removed      removed <67months;removed d/t shocking pt 15times;pierced sac of heart 1000cc ;drained  . Stomach stapling surgery  80's  . Appendectomy  1991  . Tummy tuck  1991  . Leg lipoma    . Abdominal hysterectomy  1991  . Tubal ligation  1971  . Ovarian cyst removed  1991  . Cholecystectomy open  1992  . Colonoscopy    . Thyroid lobectomy  07/27/2011    Procedure: THYROID LOBECTOMY;  Surgeon: Gayland Curry, MD,FACS;  Location: [facility];  Service: General;  Laterality: Right;  . Cardiac  defibrillator placement  >27yrs ago  . Defibrillator removed      [id number] of systems complete and found to be negative unless listed above  PHYSICAL EXAM BP 114/60  Pulse 86  Wt 284 lb (128.822 kg)  BMI 48.72 kg/m2  General: Well developed, well nourished, in no acute distress Head: Eyes PERRLA, No xanthomas.   Normal cephalic and atramatic. Goiter removed. Excessive loose skin under chin, in neck area.  Lungs: Clear bilaterally to auscultation and percussion. Heart: HRRR S1 S2, without MRG.  Pulses are 2+ & equal.            No carotid bruit. No JVD.  No abdominal bruits. No femoral bruits. Abdomen: Bowel sounds are positive, abdomen soft and non-tender without masses or                  Hernia's noted. Msk:  Back normal, normal gait. Normal strength and tone for age. Extremities: No clubbing, cyanosis or edema.  DP +1 Neuro: Alert and oriented X 3. Psych:  Good affect, responds appropriately  EKG:NSR rate of 86 bpm.  ASSESSMENT AND PLAN

## 2012-06-12 NOTE — Progress Notes (Deleted)
Name: [name]    DOB: 06/06/1945  Age: 67 y.o.  MR#: [id number]       PCP:  Maggie Font, MD      Insurance: Payor: [employer] MEDICARE  Plan: [facility]  Product Type: *No Product type*    CC:    Chief Complaint  Patient presents with  . Cardiomyopathy    Systolic Dysfunction  . Hypertension    VS Filed Vitals:   06/12/12 1531  BP: 114/60  Pulse: 86  Weight: 284 lb (128.822 kg)    Weights Current Weight  06/12/12 284 lb (128.822 kg)  01/12/12 266 lb (120.657 kg)  11/18/11 270 lb 6.4 oz (122.653 kg)    Blood Pressure  BP Readings from Last 3 Encounters:  06/12/12 114/60  03/13/12 123/73  01/12/12 122/53     Admit date:  (Not on file) Last encounter with RMR:  Visit date not found   Allergy Doxycycline  Current Outpatient Prescriptions  Medication Sig Dispense Refill  . aspirin 81 MG tablet Take 81 mg by mouth at bedtime.       . carvedilol (COREG) 25 MG tablet Take 1 tablet (25 mg total) by mouth 2 (two) times daily.  60 tablet  11  . Cholecalciferol (VITAMIN D3) 1000 UNITS CAPS Take 1 capsule by mouth every evening.       [name] CINNAMON PO Take 2 capsules by mouth every morning.       . docusate sodium (COLACE) 100 MG capsule Take 100 mg by mouth 2 (two) times daily.      . ferrous fumarate (HEMOCYTE - 106 MG FE) 325 (106 FE) MG TABS Take 1 tablet by mouth 2 (two) times a week.       . Flaxseed, Linseed, (FLAX SEED OIL PO) Take 1 capsule by mouth every morning.       . furosemide (LASIX) 40 MG tablet Take 40 mg by mouth daily as needed. For fluid retention      . l-methylfolate-B6-B12 (METANX) 3-35-2 MG TABS Take 1 tablet by mouth at bedtime.       [name] levothyroxine (SYNTHROID, LEVOTHROID) 50 MCG tablet Take 50 mcg by mouth every morning.       . metFORMIN (GLUCOPHAGE) 1000 MG tablet Take 1,000 mg by mouth 2 (two) times daily.        . pantoprazole (PROTONIX) 40 MG tablet Take 40 mg by mouth daily as needed. For acid reflux/ GERD      . spironolactone  (ALDACTONE) 25 MG tablet Take 25 mg by mouth every morning.      [name] tetrahydrozoline (EYE DROPS) 0.05 % ophthalmic solution Place 1 drop into both eyes daily as needed. For dry eye relief      . valsartan (DIOVAN) 160 MG tablet Take 160 mg by mouth at bedtime.        No current facility-administered medications for this visit.    Discontinued Meds:    Medications Discontinued During This Encounter  Medication Reason  . doxycycline (VIBRAMYCIN) 100 MG capsule Discontinued by provider  . glipiZIDE (GLUCOTROL) 5 MG tablet Discontinued by provider    Patient Active Problem List  Diagnosis  . DIABETES MELLITUS  . OVERWEIGHT/OBESITY  . HYPERTENSION, UNSPECIFIED  . CARDIOMYOPATHY, SECONDARY  . SYSTOLIC HEART FAILURE, CHRONIC  . PALPITATIONS, OCCASIONAL  . GASTROPARESIS  . S/P partial thyroidectomy -Right    LABS    Component Value Date/Time   NA 135 01/12/2012 1719   NA 140 11/24/2011 0814  NA 136 07/28/2011 0600   K 5.0 01/12/2012 1719   K 4.7 11/24/2011 0814   K 4.9 07/28/2011 0600   CL 98 01/12/2012 1719   CL 107 11/24/2011 0814   CL 102 07/28/2011 0600   CO2 25 01/12/2012 1719   CO2 22 11/24/2011 0814   CO2 25 07/28/2011 0600   GLUCOSE 147* 01/12/2012 1719   GLUCOSE 73 11/24/2011 0814   GLUCOSE 119* 07/28/2011 0600   BUN 32* 01/12/2012 1719   BUN 39* 11/24/2011 0814   BUN 24* 07/28/2011 0600   CREATININE 1.36* 01/12/2012 1719   CREATININE 1.20* 11/24/2011 0814   CREATININE 1.23* 07/28/2011 0600   CREATININE 1.16* 07/20/2011 0945   CALCIUM 10.4 01/12/2012 1719   CALCIUM 9.7 11/24/2011 0814   CALCIUM 9.4 07/28/2011 0600   GFRNONAA 40* 01/12/2012 1719   GFRNONAA 45* 07/28/2011 0600   GFRNONAA 49* 07/20/2011 0945   GFRAA 46* 01/12/2012 1719   GFRAA 53* 07/28/2011 0600   GFRAA 56* 07/20/2011 0945   CMP     Component Value Date/Time   NA 135 01/12/2012 1719   K 5.0 01/12/2012 1719   CL 98 01/12/2012 1719   CO2 25 01/12/2012 1719   GLUCOSE 147* 01/12/2012 1719   BUN 32* 01/12/2012 1719    CREATININE 1.36* 01/12/2012 1719   CREATININE 1.20* 11/24/2011 0814   CALCIUM 10.4 01/12/2012 1719   PROT 6.6 12/04/2007 0119   ALBUMIN 2.9* 12/04/2007 0119   AST 20 12/04/2007 0119   ALT 17 12/04/2007 0119   ALKPHOS 64 12/04/2007 0119   BILITOT 0.5 12/04/2007 0119   GFRNONAA 40* 01/12/2012 1719   GFRAA 46* 01/12/2012 1719       Component Value Date/Time   WBC 3.3* 01/12/2012 1719   WBC 5.6 07/28/2011 0600   WBC 4.1 07/20/2011 0945   HGB 9.4* 01/12/2012 1719   HGB 8.1* 07/28/2011 0600   HGB 9.0* 07/20/2011 0945   HCT 30.2* 01/12/2012 1719   HCT 26.4* 07/28/2011 0600   HCT 29.4* 07/20/2011 0945   MCV 78.0 01/12/2012 1719   MCV 75.6* 07/28/2011 0600   MCV 76.0* 07/20/2011 0945    Lipid Panel  No results found for this basename: chol, trig, hdl, cholhdl, vldl, ldlcalc    ABG    Component Value Date/Time   HCO3 23.3 09/24/2006 1130   TCO2 26 12/03/2007 1922     Lab Results  Component Value Date   TSH 5.027* 11/24/2011   BNP (last 3 results) No results found for this basename: PROBNP,  in the last 8760 hours Cardiac Panel (last 3 results) No results found for this basename: CKTOTAL, CKMB, TROPONINI, RELINDX,  in the last 72 hours  Iron/TIBC/Ferritin    Component Value Date/Time   IRON 32* 12/04/2007 1410   TIBC 284 12/04/2007 1410   FERRITIN 66 12/04/2007 1410     EKG Orders placed in visit on 06/12/12  . EKG 12-LEAD     Prior Assessment and Plan Problem List as of 06/12/2012     ICD-9-CM     Cardiology Problems   HYPERTENSION, UNSPECIFIED   Last Assessment & Plan   09/13/2011 Office Visit Written 09/13/2011  4:56 PM by Evans Lance, MD     Her blood pressure is well controlled. I've encouraged the patient to increase her physical activity, exercise daily, and maintain a low-sodium diet. She has lost over 50 pounds in the last 5 or 6 years, and asked the patient to continue with her program of  weight loss.    CARDIOMYOPATHY, SECONDARY   Last Assessment & Plan   05/31/2011 Office  Visit Written 05/31/2011 11:56 AM by Lendon Colonel, NP     There is no evidence of fluid overload or edema. She is asymptomatic and medically compliant. She takes Lasix prn but remains on spironolactone and coreg daily.  Refills will be provided for these medications.  Labs are ordered and followed by both Dr. Buddy Duty and Dr. Berdine Addison.  Echo will be completed for ongoing assessment of LV fx.    SYSTOLIC HEART FAILURE, CHRONIC   Last Assessment & Plan   09/13/2011 Office Visit Written 09/13/2011  4:56 PM by Evans Lance, MD     Her symptoms are well controlled, class I. Today, I have asked her to stop digoxin. I think is of little benefit. She is instructed to continue with her weight loss, and maintain a low-sodium diet. She will continue her other medical therapies.      Other   DIABETES MELLITUS   OVERWEIGHT/OBESITY   PALPITATIONS, OCCASIONAL   GASTROPARESIS   S/P partial thyroidectomy -Right       Imaging: No results found.

## 2012-06-12 NOTE — Assessment & Plan Note (Signed)
She has gained approximately 20 pounds since December after goiter removal. She is being followed by Dr. Buddy Duty endocrinologist for thyroid disease. She will see him within the next month.

## 2012-06-12 NOTE — Assessment & Plan Note (Signed)
No evidence of CHF fluid retention, wheezes or rhonchi. She is very will compensated at this time. Do not think weight gain is related to CHF. She continues on Lasix 40 mg as needed, with potassium replacement. She will continue on Coreg 25 mg twice a day as directed digoxin has been discontinued by Dr. [name].

## 2012-06-12 NOTE — Patient Instructions (Addendum)
Your physician recommends that you schedule a follow-up appointment in: 12 months.  

## 2012-06-28 ENCOUNTER — Ambulatory Visit (INDEPENDENT_AMBULATORY_CARE_PROVIDER_SITE_OTHER): Payer: PRIVATE HEALTH INSURANCE | Admitting: General Surgery

## 2012-06-28 ENCOUNTER — Encounter (INDEPENDENT_AMBULATORY_CARE_PROVIDER_SITE_OTHER): Payer: Self-pay | Admitting: General Surgery

## 2012-06-28 ENCOUNTER — Other Ambulatory Visit (INDEPENDENT_AMBULATORY_CARE_PROVIDER_SITE_OTHER): Payer: Self-pay | Admitting: General Surgery

## 2012-06-28 VITALS — BP 122/82 | HR 62 | Temp 97.1°F | Resp 14 | Ht 64.0 in | Wt 287.6 lb

## 2012-06-28 DIAGNOSIS — E041 Nontoxic single thyroid nodule: Secondary | ICD-10-CM

## 2012-06-28 NOTE — Progress Notes (Signed)
Subjective:     Patient ID: [name], female   DOB: 1945/06/16, 67 y.o.   MRN: [id number]  HPI 67 year old [place] American female comes in for long-term followup. I last saw her in the office on 11/18/2011. 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 She last saw Dr. Buddy Duty in December 2013. At that time her TSH level was 4.86. Her Synthroid dosage is around 50 mcg per day. She had a followup neck ultrasound on 03/27/2012 which demonstrated subcentimeter nodules without any evidence of microcalcifications. She denies any fevers or chills. She states that she has had sensations of being cold. She also had a problem with constipation. She denies any palpitations. She does take an iron supplement on occasion. She denies any voice changes. She denies any difficulty swallowing. She denies any lymphadenopathy. She does complain of some weight gain. It appears that she's gained about 18 pounds since I last saw her.  PMHx, PSHx, SOCHx, FAMHx, ALL reviewed    Review of Systems 8 point ROS performed and negative     Objective:   Physical Exam BP 122/82  Pulse 62  Temp(Src) 97.1 F (36.2 C) (Temporal)  Resp 14  Ht 5\' 4"  (1.626 m)  Wt 287 lb 9.6 oz (130.455 kg)  BMI 49.34 kg/m2 Alert, NAD Nonfocal, MAE, ox3 CTA, symmetric RRR Neck - well healed Kocher incision, no LAD. 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 She states that she had her TSH level checked a few weeks ago unfortunately I do not have these records. I encouraged her to follow Dr. Buddy Duty at the end of the month to monitor her TSH level and thyroid dosage. There are no troubling lesions in her remaining thyroid. I recommend getting a neck ultrasound in one year. We discussed the importance of drinking  6-8 glasses of water a day as well as eating a high-fiber diet. We discussed fiber supplement such as Benefiber or Metamucil. Followup 1 year  Leighton Ruff. Redmond Pulling, MD, FACS General, Bariatric, & Minimally Invasive Surgery [facility], [place]

## 2012-06-28 NOTE — Patient Instructions (Addendum)
Follow up with Dr Buddy Duty to monitor your thyroid level Increase the fiber in your diet with metamucil or Benefiber - start with a low dose and increase over 2 weeks in order to avoid bloating/cramping Need a neck ultrasound in Jan 2015  [place]. Irregular bowel habits such as constipation and diarrhea can lead to many problems over time.  Having one soft bowel movement a day is the most important way to prevent further problems.  The anorectal canal is designed to handle stretching and feces to safely manage our ability to get rid of solid waste (feces, poop, stool) out of our body.  BUT, hard constipated stools can act like ripping concrete bricks and diarrhea can be a burning fire to this very sensitive area of our body, causing inflamed hemorrhoids, anal fissures, increasing risk is perirectal abscesses, abdominal pain/bloating, an making irritable bowel worse.     The goal: ONE SOFT BOWEL MOVEMENT A DAY!  To have soft, regular bowel movements:    Drink at least 8 tall glasses of water a day.     Take plenty of fiber.  Fiber is the undigested part of plant food that passes into the colon, acting s "natures broom" to encourage bowel motility and movement.  Fiber can absorb and hold large amounts of water. This results in a larger, bulkier stool, which is soft and easier to pass. Work gradually over several weeks up to 6 servings a day of fiber (25g a day even more if needed) in the form of: o Vegetables -- Root (potatoes, carrots, turnips), leafy green (lettuce, salad greens, celery, spinach), or cooked high residue (cabbage, broccoli, etc) o Fruit -- Fresh (unpeeled skin & pulp), Dried (prunes, apricots, cherries, etc ),  or stewed ( applesauce)  o Whole grain breads, pasta, etc (whole wheat)  o Bran cereals    Bulking Agents -- This type of water-retaining fiber generally is easily obtained each day by one of the following:  o Psyllium bran -- The psyllium plant is remarkable  because its ground seeds can retain so much water. This product is available as Metamucil, Konsyl, Effersyllium, Per Diem Fiber, or the less expensive generic preparation in drug and health food stores. Although labeled a laxative, it really is not a laxative.  o Methylcellulose -- This is another fiber derived from wood which also retains water. It is available as Citrucel. o Polyethylene Glycol - and "artificial" fiber commonly called Miralax or Glycolax.  It is helpful for people with gassy or bloated feelings with regular fiber o Flax Seed - a less gassy fiber than psyllium   No reading or other relaxing activity while on the toilet. If bowel movements take longer than 5 minutes, you are too constipated   AVOID CONSTIPATION.  High fiber and water intake usually takes care of this.  Sometimes a laxative is needed to stimulate more frequent bowel movements, but    Laxatives are not a good long-term solution as it can wear the colon out. o Osmotics (Milk of Magnesia, Fleets phosphosoda, Magnesium citrate, MiraLax, GoLytely) are safer than  o Stimulants (Senokot, Castor Oil, Dulcolax, Ex Lax)    o Do not take laxatives for more than 7days in a row.    IF SEVERELY CONSTIPATED, try a Bowel Retraining Program: o Do not use laxatives.  o Eat a diet high in roughage, such as bran cereals and leafy vegetables.  o Drink six (6) ounces of prune or apricot juice each morning.  o Eat two (2) large servings of stewed fruit each day.  o Take one (1) heaping tablespoon of a psyllium-based bulking agent twice a day. Use sugar-free sweetener when possible to avoid excessive calories.  o Eat a normal breakfast.  o Set aside 15 minutes after breakfast to sit on the toilet, but do not strain to have a bowel movement.  o If you do not have a bowel movement by the third day, use an enema and repeat the above steps.  o

## 2012-07-19 ENCOUNTER — Encounter (INDEPENDENT_AMBULATORY_CARE_PROVIDER_SITE_OTHER): Payer: Self-pay

## 2012-09-13 ENCOUNTER — Ambulatory Visit: Payer: PRIVATE HEALTH INSURANCE | Admitting: Internal Medicine

## 2012-10-05 ENCOUNTER — Ambulatory Visit: Payer: PRIVATE HEALTH INSURANCE | Admitting: Internal Medicine

## 2012-10-25 ENCOUNTER — Other Ambulatory Visit: Payer: Self-pay

## 2012-10-31 ENCOUNTER — Ambulatory Visit: Payer: PRIVATE HEALTH INSURANCE | Admitting: Internal Medicine

## 2012-12-18 ENCOUNTER — Ambulatory Visit: Payer: PRIVATE HEALTH INSURANCE | Admitting: Internal Medicine

## 2013-01-10 ENCOUNTER — Encounter: Payer: Self-pay | Admitting: Internal Medicine

## 2013-01-10 ENCOUNTER — Ambulatory Visit: Payer: PRIVATE HEALTH INSURANCE | Admitting: Internal Medicine

## 2013-01-25 ENCOUNTER — Other Ambulatory Visit: Payer: Self-pay

## 2013-02-07 ENCOUNTER — Ambulatory Visit: Payer: PRIVATE HEALTH INSURANCE | Admitting: Physician Assistant

## 2013-02-08 ENCOUNTER — Ambulatory Visit (INDEPENDENT_AMBULATORY_CARE_PROVIDER_SITE_OTHER): Payer: PRIVATE HEALTH INSURANCE | Admitting: Adult Health

## 2013-02-08 ENCOUNTER — Encounter: Payer: Self-pay | Admitting: Adult Health

## 2013-02-08 VITALS — BP 121/72 | HR 68 | Ht 64.0 in | Wt 286.2 lb

## 2013-02-08 DIAGNOSIS — I5022 Chronic systolic (congestive) heart failure: Secondary | ICD-10-CM

## 2013-02-08 DIAGNOSIS — I10 Essential (primary) hypertension: Secondary | ICD-10-CM

## 2013-02-08 DIAGNOSIS — R002 Palpitations: Secondary | ICD-10-CM

## 2013-02-08 LAB — CBC
Hemoglobin: 8.7 g/dL — ABNORMAL LOW (ref 12.0–15.0)
MCH: 24.2 pg — ABNORMAL LOW (ref 26.0–34.0)
Platelets: 155 10*3/uL (ref 150–400)
RBC: 3.59 MIL/uL — ABNORMAL LOW (ref 3.87–5.11)
WBC: 3.3 10*3/uL — ABNORMAL LOW (ref 4.0–10.5)

## 2013-02-08 NOTE — Progress Notes (Deleted)
Name: [name]    DOB: [birth year]  Age: 67 y.o.  MR#: [id number]       PCP:  Maggie Font, MD      Insurance: Payor: [employer] MEDICARE / Plan: Cottie Banda / Product Type: *No Product type* /   CC:    Chief Complaint  Patient presents with  . Cardiomyopathy  . Hypertension  . Congestive Heart Failure    Systolic   PT NOTING BP STARTED DROPPING Sunday 97/64, Monday AM 122/70 AFTER SHE DECIDED NOT TO TAKE THE DIOVAN ON Sunday NIGHT NOR Monday NIGHT, PT DID DECIDE TO START TAKING HALF OF THE DIOVAN 80MG  ON Tuesday NIGHT , PT SYNTHROID MEDICATION INCREASED 2 WEEKS AGO PER PT ALWAYS COLD  VS Filed Vitals:   02/08/13 1342  BP: 121/72  Pulse: 68  Height: 5\' 4"  (1.626 m)  Weight: 286 lb 4 oz (129.842 kg)    Weights Current Weight  02/08/13 286 lb 4 oz (129.842 kg)  06/28/12 287 lb 9.6 oz (130.455 kg)  06/12/12 284 lb (128.822 kg)    Blood Pressure  BP Readings from Last 3 Encounters:  02/08/13 121/72  06/28/12 122/82  06/12/12 114/60     Admit date:  (Not on file) Last encounter with RMR:  Visit date not found   Allergy Doxycycline  Current Outpatient Prescriptions  Medication Sig Dispense Refill  . aspirin 81 MG tablet Take 81 mg by mouth at bedtime.       [name] atorvastatin (LIPITOR) 10 MG tablet       . carvedilol (COREG) 25 MG tablet Take 1 tablet (25 mg total) by mouth 2 (two) times daily.  60 tablet  11  . Cholecalciferol (VITAMIN D3) 1000 UNITS CAPS Take 1 capsule by mouth every evening.       [name] CINNAMON PO Take 2 capsules by mouth every morning.       . docusate sodium (COLACE) 100 MG capsule Take 100 mg by mouth 2 (two) times daily.      . ferrous fumarate (HEMOCYTE - 106 MG FE) 325 (106 FE) MG TABS Take 1 tablet by mouth 2 (two) times a week.       . Flaxseed, Linseed, (FLAX SEED OIL PO) Take 1 capsule by mouth every morning.       . furosemide (LASIX) 40 MG tablet Take 40 mg by mouth daily as needed. For fluid retention      . glipiZIDE (GLUCOTROL) 5 MG  tablet Take 5 mg by mouth daily before breakfast.       . levothyroxine (SYNTHROID, LEVOTHROID) 50 MCG tablet Take 88 mcg by mouth every morning.       . metFORMIN (GLUCOPHAGE) 1000 MG tablet Take 1,000 mg by mouth 2 (two) times daily.        . pantoprazole (PROTONIX) 40 MG tablet Take 40 mg by mouth as needed. For acid reflux/ GERD      . spironolactone (ALDACTONE) 25 MG tablet Take 25 mg by mouth every morning.      [name] tetrahydrozoline (EYE DROPS) 0.05 % ophthalmic solution Place 1 drop into both eyes daily as needed. For dry eye relief      . valsartan (DIOVAN) 160 MG tablet Take 80 mg by mouth at bedtime.        No current facility-administered medications for this visit.    Discontinued Meds:    Medications Discontinued During This Encounter  Medication Reason  . l-methylfolate-B6-B12 (METANX) 3-35-2 MG TABS Error  .  pantoprazole (PROTONIX) 40 MG tablet     Patient Active Problem List   Diagnosis Date Noted  . S/P partial thyroidectomy -Right 07/28/2011  . GASTROPARESIS 06/02/2010  . PALPITATIONS, OCCASIONAL 04/23/2010  . DIABETES MELLITUS 09/18/2008  . OVERWEIGHT/OBESITY 09/18/2008  . HYPERTENSION, UNSPECIFIED 09/18/2008  . CARDIOMYOPATHY, SECONDARY 09/18/2008  . SYSTOLIC HEART FAILURE, CHRONIC 09/18/2008    LABS    Component Value Date/Time   NA 135 01/12/2012 1719   NA 140 11/24/2011 0814   NA 136 07/28/2011 0600   K 5.0 01/12/2012 1719   K 4.7 11/24/2011 0814   K 4.9 07/28/2011 0600   CL 98 01/12/2012 1719   CL 107 11/24/2011 0814   CL 102 07/28/2011 0600   CO2 25 01/12/2012 1719   CO2 22 11/24/2011 0814   CO2 25 07/28/2011 0600   GLUCOSE 147* 01/12/2012 1719   GLUCOSE 73 11/24/2011 0814   GLUCOSE 119* 07/28/2011 0600   BUN 32* 01/12/2012 1719   BUN 39* 11/24/2011 0814   BUN 24* 07/28/2011 0600   CREATININE 1.36* 01/12/2012 1719   CREATININE 1.20* 11/24/2011 0814   CREATININE 1.23* 07/28/2011 0600   CREATININE 1.16* 07/20/2011 0945   CALCIUM 10.4 01/12/2012 1719   CALCIUM 9.7  11/24/2011 0814   CALCIUM 9.4 07/28/2011 0600   GFRNONAA 40* 01/12/2012 1719   GFRNONAA 45* 07/28/2011 0600   GFRNONAA 49* 07/20/2011 0945   GFRAA 46* 01/12/2012 1719   GFRAA 53* 07/28/2011 0600   GFRAA 56* 07/20/2011 0945   CMP     Component Value Date/Time   NA 135 01/12/2012 1719   K 5.0 01/12/2012 1719   CL 98 01/12/2012 1719   CO2 25 01/12/2012 1719   GLUCOSE 147* 01/12/2012 1719   BUN 32* 01/12/2012 1719   CREATININE 1.36* 01/12/2012 1719   CREATININE 1.20* 11/24/2011 0814   CALCIUM 10.4 01/12/2012 1719   PROT 6.6 12/04/2007 0119   ALBUMIN 2.9* 12/04/2007 0119   AST 20 12/04/2007 0119   ALT 17 12/04/2007 0119   ALKPHOS 64 12/04/2007 0119   BILITOT 0.5 12/04/2007 0119   GFRNONAA 40* 01/12/2012 1719   GFRAA 46* 01/12/2012 1719       Component Value Date/Time   WBC 3.3* 01/12/2012 1719   WBC 5.6 07/28/2011 0600   WBC 4.1 07/20/2011 0945   HGB 9.4* 01/12/2012 1719   HGB 8.1* 07/28/2011 0600   HGB 9.0* 07/20/2011 0945   HCT 30.2* 01/12/2012 1719   HCT 26.4* 07/28/2011 0600   HCT 29.4* 07/20/2011 0945   MCV 78.0 01/12/2012 1719   MCV 75.6* 07/28/2011 0600   MCV 76.0* 07/20/2011 0945    Lipid Panel  No results found for this basename: chol, trig, hdl, cholhdl, vldl, ldlcalc    ABG    Component Value Date/Time   HCO3 23.3 09/24/2006 1130   TCO2 26 12/03/2007 1922     Lab Results  Component Value Date   TSH 5.027* 11/24/2011   BNP (last 3 results) No results found for this basename: PROBNP,  in the last 8760 hours Cardiac Panel (last 3 results) No results found for this basename: CKTOTAL, CKMB, TROPONINI, RELINDX,  in the last 72 hours  Iron/TIBC/Ferritin    Component Value Date/Time   IRON 32* 12/04/2007 1410   TIBC 284 12/04/2007 1410   FERRITIN 66 12/04/2007 1410     EKG Orders placed in visit on 06/12/12  . EKG 12-LEAD     Prior Assessment and Plan Problem List as of 02/08/2013  DIABETES MELLITUS   OVERWEIGHT/OBESITY   Last Assessment & Plan   06/12/2012 Office Visit  Written 06/12/2012  4:01 PM by Lendon Colonel, NP     She has gained approximately 20 pounds since December after goiter removal. She is being followed by Dr. Buddy Duty endocrinologist for thyroid disease. She will see him within the next month.     HYPERTENSION, UNSPECIFIED   Last Assessment & Plan   06/12/2012 Office Visit Written 06/12/2012  4:00 PM by Lendon Colonel, NP     Excellent control of blood pressure at this time. She has been medically compliant. She is unhappy with weight gain but otherwise she has been doing very well. We will make no changes in her medication regimen at this time. As per Dr. Berdine Addison one week ago.    CARDIOMYOPATHY, SECONDARY   Last Assessment & Plan   05/31/2011 Office Visit Written 05/31/2011 11:56 AM by Lendon Colonel, NP     There is no evidence of fluid overload or edema. She is asymptomatic and medically compliant. She takes Lasix prn but remains on spironolactone and coreg daily.  Refills will be provided for these medications.  Labs are ordered and followed by both Dr. Buddy Duty and Dr. Berdine Addison.  Echo will be completed for ongoing assessment of LV fx.    SYSTOLIC HEART FAILURE, CHRONIC   Last Assessment & Plan   06/12/2012 Office Visit Written 06/12/2012  4:02 PM by Lendon Colonel, NP     No evidence of CHF fluid retention, wheezes or rhonchi. She is very will compensated at this time. Do not think weight gain is related to CHF. She continues on Lasix 40 mg as needed, with potassium replacement. She will continue on Coreg 25 mg twice a day as directed digoxin has been discontinued by Dr. [name].    PALPITATIONS, OCCASIONAL   GASTROPARESIS   S/P partial thyroidectomy -Right       Imaging: No results found.

## 2013-02-08 NOTE — Assessment & Plan Note (Signed)
No further complaints at this time. Continue Coreg.

## 2013-02-08 NOTE — Assessment & Plan Note (Signed)
No evidence of fluid retention, she is not short of breath, weight is stable. Will not make any changes at this time

## 2013-02-08 NOTE — Patient Instructions (Addendum)
Your physician recommends that you schedule a follow-up appointment in: KEEP APPOINTMENT WITH Dr. Cristopher Peru 02-26-13 AT 8:30AM  Your physician has recommended you make the following change in your medication:   1) CONTINUE TO [place] 80MG  ONCE DAILY  Your physician recommends that you return for lab work in: TODAY (Rosalia CBC,BMET)   WE WILL CALL YOU WITH YOUR TEST RESULTS/INSTRUCTIONS/NEXT STEPS ONCE RECEIVED BY THE PROVIDER

## 2013-02-08 NOTE — Progress Notes (Signed)
HPI: [name] is a 67 year old patient of Dr. Crissie Sickles, we are seeing for ongoing assessment and management of nonischemic cardiomyopathy, chronic systolic heart failure, status post ICD implantation, complicated by dislodgment of her defibrillator lead x2. With multiple ICD shocks secondary to dislodgment. Her device was removed. She is also being followed for diabetes and thyroid disease by Dr. Buddy Duty, [facility] endocrinologist. She was last seen in the office in March of 2014, she was without cardiac complaint at that time.    She comes today stating that she decreased her Diovan 160 mg daily to 80 mg daily due to hypotension and dizziness. She states that when she felt lightheaded she took her blood pressure and was in the high 90s. She skipped one dose of the Diovan, and then restarted at half of a dose. Since that time she has not had any further problems. She denies worsening palpitations chest pain or muscle aches and pains.  Allergies  Allergen Reactions  . Doxycycline Rash    Current Outpatient Prescriptions  Medication Sig Dispense Refill  . aspirin 81 MG tablet Take 81 mg by mouth at bedtime.       [name] atorvastatin (LIPITOR) 10 MG tablet       . carvedilol (COREG) 25 MG tablet Take 1 tablet (25 mg total) by mouth 2 (two) times daily.  60 tablet  11  . Cholecalciferol (VITAMIN D3) 1000 UNITS CAPS Take 1 capsule by mouth every evening.       [name] CINNAMON PO Take 2 capsules by mouth every morning.       . docusate sodium (COLACE) 100 MG capsule Take 100 mg by mouth 2 (two) times daily.      . ferrous fumarate (HEMOCYTE - 106 MG FE) 325 (106 FE) MG TABS Take 1 tablet by mouth 2 (two) times a week.       . Flaxseed, Linseed, (FLAX SEED OIL PO) Take 1 capsule by mouth every morning.       . furosemide (LASIX) 40 MG tablet Take 40 mg by mouth daily as needed. For fluid retention      . glipiZIDE (GLUCOTROL) 5 MG tablet Take 5 mg by mouth daily before breakfast.       . levothyroxine  (SYNTHROID, LEVOTHROID) 50 MCG tablet Take 88 mcg by mouth every morning.       . metFORMIN (GLUCOPHAGE) 1000 MG tablet Take 1,000 mg by mouth 2 (two) times daily.        . pantoprazole (PROTONIX) 40 MG tablet Take 40 mg by mouth as needed. For acid reflux/ GERD      . spironolactone (ALDACTONE) 25 MG tablet Take 25 mg by mouth every morning.      [name] tetrahydrozoline (EYE DROPS) 0.05 % ophthalmic solution Place 1 drop into both eyes daily as needed. For dry eye relief      . valsartan (DIOVAN) 160 MG tablet Take 80 mg by mouth at bedtime.        No current facility-administered medications for this visit.    Past Medical History  Diagnosis Date  . Systolic heart failure     Chronic  . Overweight(278.02)   . Cardiomyopathy secondary   . Hypertension     takes Diovan and Spirololactone daily  . Dysrhythmia     pt takes Digoxin and Carvedilol daily  . CHF (congestive heart failure)     takes Lasix prn  . Sleep apnea     doesn't use CPAP  .  Pneumonia     hx of;many yrs ago  . Peripheral neuropathy   . Arthritis     ankles  . Gout     hx of x 1 time in Aug 2012  . GERD (gastroesophageal reflux disease)     takes Protonix seldom  . H/O hiatal hernia   . Hemorrhoid   . Anemia     takes Ferrous Fumarate 2 times week  . Blood transfusion     hx of-54yrs ago  . Thyroid nodule     right  . Diabetes mellitus     takes Glipizide and Metformin daily  . Cataract     bilateral and immature    Past Surgical History  Procedure Laterality Date  . Cardiac defirillator removed      removed <44months;removed d/t shocking pt 15times;pierced sac of heart 1000cc ;drained  . Stomach stapling surgery  80's  . Appendectomy  1991  . Tummy tuck  1991  . Leg lipoma    . Abdominal hysterectomy  1991  . Tubal ligation  1971  . Ovarian cyst removed  1991  . Cholecystectomy open  1992  . Colonoscopy    . Thyroid lobectomy  07/27/2011    Procedure: THYROID LOBECTOMY;  Surgeon: Gayland Curry,  MD,FACS;  Location: [facility];  Service: General;  Laterality: Right;  . Cardiac defibrillator placement  >70yrs ago  . Defibrillator removed      [id number] of systems complete and found to be negative unless listed above  PHYSICAL EXAM BP 121/72  Pulse 68  Ht 5\' 4"  (1.626 m)  Wt 286 lb 4 oz (129.842 kg)  BMI 49.11 kg/m2  General: Well developed, well nourished, in no acute distress Head: Eyes PERRLA, No xanthomas.   Normal cephalic and atramatic  Lungs: Clear bilaterally to auscultation and percussion. Heart: HRRR S1 S2, without MRG.  Pulses are 2+ & equal.            No carotid bruit. No JVD.  No abdominal bruits. No femoral bruits. Abdomen: Bowel sounds are positive, abdomen soft and non-tender without masses or                  Hernia's noted. Msk:  Back normal, normal gait. Normal strength and tone for age. Extremities: No clubbing, cyanosis or edema.  DP +1 Neuro: Alert and oriented X 3. Psych:  Good affect, responds appropriately  EKG:  ASSESSMENT AND PLAN

## 2013-02-08 NOTE — Assessment & Plan Note (Signed)
I agree with the patient to decrease her Diovan dose to 80 mg daily. Pressure today is 121/72 and she is feeling well without any further symptoms. She is on both Lasix and spironolactone. Would check a BMET for kidney function. Patient has a history of anemia and I will therefore check a CBC for evaluation of worsening.

## 2013-02-09 ENCOUNTER — Telehealth: Payer: Self-pay | Admitting: *Deleted

## 2013-02-09 DIAGNOSIS — I10 Essential (primary) hypertension: Secondary | ICD-10-CM

## 2013-02-09 LAB — BASIC METABOLIC PANEL
CO2: 26 mEq/L (ref 19–32)
Chloride: 100 mEq/L (ref 96–112)
Glucose, Bld: 121 mg/dL — ABNORMAL HIGH (ref 70–99)
Potassium: 4.4 mEq/L (ref 3.5–5.3)
Sodium: 134 mEq/L — ABNORMAL LOW (ref 135–145)

## 2013-02-09 NOTE — Telephone Encounter (Signed)
PT RETURNING [name] CALL PLEASE CALL BACK THIS NUMBER [phone]

## 2013-02-09 NOTE — Telephone Encounter (Signed)
Pt notified to decrease spironolactone to 12.5 mg daily ,repeat bmet in 1 week   Pt agrees with dispo

## 2013-02-16 LAB — BASIC METABOLIC PANEL
BUN: 38 mg/dL — ABNORMAL HIGH (ref 6–23)
CO2: 25 mEq/L (ref 19–32)
Calcium: 9 mg/dL (ref 8.4–10.5)
Chloride: 105 mEq/L (ref 96–112)
Creat: 1.63 mg/dL — ABNORMAL HIGH (ref 0.50–1.10)
Glucose, Bld: 159 mg/dL — ABNORMAL HIGH (ref 70–99)

## 2013-02-19 MED ORDER — SPIRONOLACTONE 12.5 MG HALF TABLET: 12.5000 mg | ORAL_TABLET | Freq: Every day | ORAL | Status: DC

## 2013-02-19 NOTE — Addendum Note (Signed)
Addended by: Truett Mainland on: 02/19/2013 05:22 PM   Modules accepted: Orders

## 2013-02-19 NOTE — Addendum Note (Signed)
Addended by: Truett Mainland on: 02/19/2013 05:30 PM   Modules accepted: Orders

## 2013-02-19 NOTE — Addendum Note (Signed)
Addended by: Truett Mainland on: 02/19/2013 05:25 PM   Modules accepted: Orders

## 2013-02-26 ENCOUNTER — Encounter: Payer: Self-pay | Admitting: *Deleted

## 2013-02-26 ENCOUNTER — Ambulatory Visit (INDEPENDENT_AMBULATORY_CARE_PROVIDER_SITE_OTHER): Payer: PRIVATE HEALTH INSURANCE | Admitting: Internal Medicine

## 2013-02-26 ENCOUNTER — Encounter: Payer: Self-pay | Admitting: Internal Medicine

## 2013-02-26 VITALS — BP 121/63 | HR 78 | Ht 64.0 in | Wt 287.0 lb

## 2013-02-26 DIAGNOSIS — I5022 Chronic systolic (congestive) heart failure: Secondary | ICD-10-CM

## 2013-02-26 DIAGNOSIS — E663 Overweight: Secondary | ICD-10-CM

## 2013-02-26 LAB — BASIC METABOLIC PANEL
BUN: 35 mg/dL — ABNORMAL HIGH (ref 6–23)
Calcium: 9.7 mg/dL (ref 8.4–10.5)
Chloride: 101 mEq/L (ref 96–112)
Creat: 1.56 mg/dL — ABNORMAL HIGH (ref 0.50–1.10)

## 2013-02-26 MED ORDER — CARVEDILOL 25 MG PO TABS: 12.5000 mg | ORAL_TABLET | Freq: Two times a day (BID) | ORAL | Status: DC

## 2013-02-26 NOTE — Assessment & Plan Note (Signed)
I have asked the patient to lose 10 lbs when I see her back.

## 2013-02-26 NOTE — Patient Instructions (Signed)
Your physician recommends that you schedule a follow-up appointment in: 6 months with Dr [name] Dennis Bast will receive a reminder letter two months in advance reminding you to call and schedule your appointment. If you don't receive this letter, please contact our office.  Your physician has recommended you make the following change in your medication:  Start Carvedilol 12.5 mg twice a day.

## 2013-02-26 NOTE — Progress Notes (Signed)
HPI [name] returns today for followup. She is a very pleasant 71 old woman with a nonischemic cardiomyopathy, chronic systolic heart failure with improvement in left ventricular function, status post remote ICD implantation, complicated by dislodgment of her defibrillator lead x2, status post multiple ICD shocks secondary to the dislodgment. She has been stable after removal of her device. Her ejection fraction has been somewhat variable from a low of 20-25% many years ago, to a normalization over a year ago. Repeat 2-D echo obtained several months ago demonstrates mild left ventricular dysfunction with ejection fraction of 45%. In the interim, the patient has done well. She denies chest pain, shortness of breath, syncope, or weakness. She has had some thyroid problems and gained weight. She also notes some dizziness and low blood pressure and has backed off of some of her blood pressure meds. Allergies  Allergen Reactions  . Doxycycline Rash     Current Outpatient Prescriptions  Medication Sig Dispense Refill  . aspirin 81 MG tablet Take 81 mg by mouth at bedtime.       [name] atorvastatin (LIPITOR) 10 MG tablet       . Cholecalciferol (VITAMIN D3) 1000 UNITS CAPS Take 1 capsule by mouth every evening.       [name] CINNAMON PO Take 2 capsules by mouth every morning.       . docusate sodium (COLACE) 100 MG capsule Take 100 mg by mouth 2 (two) times daily.      . ferrous fumarate (HEMOCYTE - 106 MG FE) 325 (106 FE) MG TABS Take 1 tablet by mouth 2 (two) times a week.       . Flaxseed, Linseed, (FLAX SEED OIL PO) Take 1 capsule by mouth every morning.       . furosemide (LASIX) 40 MG tablet Take 40 mg by mouth daily as needed. For fluid retention      . glipiZIDE (GLUCOTROL) 5 MG tablet Take 5 mg by mouth daily before breakfast.       . levothyroxine (SYNTHROID, LEVOTHROID) 50 MCG tablet Take 88 mcg by mouth every morning.       . metFORMIN (GLUCOPHAGE) 1000 MG tablet Take 1,000 mg by mouth 2 (two)  times daily.        . pantoprazole (PROTONIX) 40 MG tablet Take 40 mg by mouth as needed. For acid reflux/ GERD      . spironolactone (ALDACTONE) 12.5 mg TABS tablet Take 0.5 tablets (12.5 mg total) by mouth daily.  30 tablet  6  . tetrahydrozoline (EYE DROPS) 0.05 % ophthalmic solution Place 1 drop into both eyes daily as needed. For dry eye relief      . valsartan (DIOVAN) 160 MG tablet Take 80 mg by mouth at bedtime.       . carvedilol (COREG) 25 MG tablet Take 1 tablet (25 mg total) by mouth 2 (two) times daily.  60 tablet  11   No current facility-administered medications for this visit.     Past Medical History  Diagnosis Date  . Systolic heart failure     Chronic  . Overweight(278.02)   . Cardiomyopathy secondary   . Hypertension     takes Diovan and Spirololactone daily  . Dysrhythmia     pt takes Digoxin and Carvedilol daily  . CHF (congestive heart failure)     takes Lasix prn  . Sleep apnea     doesn't use CPAP  . Pneumonia     hx of;many yrs ago  .  Peripheral neuropathy   . Arthritis     ankles  . Gout     hx of x 1 time in Aug 2012  . GERD (gastroesophageal reflux disease)     takes Protonix seldom  . H/O hiatal hernia   . Hemorrhoid   . Anemia     takes Ferrous Fumarate 2 times week  . Blood transfusion     hx of-78yrs ago  . Thyroid nodule     right  . Diabetes mellitus     takes Glipizide and Metformin daily  . Cataract     bilateral and immature    ROS:   All systems reviewed and negative except as noted in the HPI.   Past Surgical History  Procedure Laterality Date  . Cardiac defirillator removed      removed <71months;removed d/t shocking pt 15times;pierced sac of heart 1000cc ;drained  . Stomach stapling surgery  80's  . Appendectomy  1991  . Tummy tuck  1991  . Leg lipoma    . Abdominal hysterectomy  1991  . Tubal ligation  1971  . Ovarian cyst removed  1991  . Cholecystectomy open  1992  . Colonoscopy    . Thyroid lobectomy   07/27/2011    Procedure: THYROID LOBECTOMY;  Surgeon: Gayland Curry, MD,FACS;  Location: [facility];  Service: General;  Laterality: Right;  . Cardiac defibrillator placement  >30yrs ago  . Defibrillator removed       Family History  Problem Relation Age of Onset  . Anesthesia problems Neg Hx   . Hypotension Neg Hx   . Malignant hyperthermia Neg Hx   . Pseudochol deficiency Neg Hx      History   Social History  . Marital Status: Widowed    Spouse Name: N/A    Number of Children: N/A  . Years of Education: N/A   Occupational History  . Not on file.   Social History Main Topics  . Smoking status: Never Smoker   . Smokeless tobacco: Not on file  . Alcohol Use: Yes     Comment: glass wine occasionally  . Drug Use: No  . Sexual Activity: Yes    Birth Control/ Protection: Surgical   Other Topics Concern  . Not on file   Social History Narrative  . No narrative on file     BP 121/63  Pulse 78  Ht 5\' 4"  (1.626 m)  Wt 287 lb (130.182 kg)  BMI 49.24 kg/m2  Physical Exam:  Well appearing morbidly obese, middle-age woman, NAD HEENT: Unremarkable Neck:  No JVD, no thyromegally Lungs:  Clear with no wheezes, rales, or rhonchi. HEART:  Regular rate rhythm, no murmurs, no rubs, no clicks Abd:  soft, positive bowel sounds, no organomegally, no rebound, no guarding Ext:  2 plus pulses, no edema, no cyanosis, no clubbing Skin:  No rashes no nodules Neuro:  CN II through XII intact, motor grossly intact  Assess/Plan:

## 2013-02-26 NOTE — Assessment & Plan Note (Signed)
Her blood pressure has run a bit low and I have asked the patient to reduce her dose of coreg to 12.5 mg twice daily.

## 2013-03-06 ENCOUNTER — Telehealth (INDEPENDENT_AMBULATORY_CARE_PROVIDER_SITE_OTHER): Payer: Self-pay | Admitting: *Deleted

## 2013-03-06 NOTE — Telephone Encounter (Signed)
I spoke with pt to inform her of the appt for her f/u thyroid US at [facility] on 04/06/13 with an arrival time of 11:15am.

## 2013-04-03 ENCOUNTER — Ambulatory Visit
Admission: RE | Admit: 2013-04-03 | Discharge: 2013-04-03 | Disposition: A | Discharge status: Home / Self Care | Payer: PRIVATE HEALTH INSURANCE | Source: Ambulatory Visit | Attending: General Surgery | Admitting: General Surgery

## 2013-04-03 DIAGNOSIS — E041 Nontoxic single thyroid nodule: Secondary | ICD-10-CM

## 2013-04-06 ENCOUNTER — Other Ambulatory Visit: Payer: PRIVATE HEALTH INSURANCE

## 2013-04-11 ENCOUNTER — Telehealth (INDEPENDENT_AMBULATORY_CARE_PROVIDER_SITE_OTHER): Payer: Self-pay

## 2013-04-11 DIAGNOSIS — E041 Nontoxic single thyroid nodule: Secondary | ICD-10-CM

## 2013-04-11 NOTE — Telephone Encounter (Signed)
Message copied by Illene Regulus on Wed Apr 11, 2013 11:52 AM ------      Message from: Redmond Pulling, ERIC M      Created: Tue Apr 03, 2013  2:31 PM       Thyroid ultrasound looks good. No significant nodules. If pt hasn't had her thyroid function (tsh level) tested in past 6 months  Then she needs to follow up with Dr Buddy Duty ------

## 2013-04-11 NOTE — Telephone Encounter (Signed)
Called pt to notify her that the thyroid u/s report was reviewed by Dr Redmond Pulling. The thyroid u/s looks good with no significant nodules per Dr Redmond Pulling. The pt has not had any recent thyroid function labs drawn so I will place an order in epic for the pt to get her TSH. The pt needs to f/u with Dr Buddy Duty per DR Redmond Pulling. The pt has an appt with Dr Buddy Duty for April 2015. I will notify Dr Redmond Pulling of this information. The pt will go to [facility] labs in [place] to get labs drawn.

## 2013-04-13 LAB — TSH: TSH: 2.583 u[IU]/mL (ref 0.350–4.500)

## 2013-04-23 ENCOUNTER — Telehealth (INDEPENDENT_AMBULATORY_CARE_PROVIDER_SITE_OTHER): Payer: Self-pay | Admitting: General Surgery

## 2013-04-23 NOTE — Telephone Encounter (Signed)
Pt called and is curious about her lab results.  Gave her the results.  She states she is due back for her 1 year follow up in April.

## 2013-05-21 ENCOUNTER — Other Ambulatory Visit (HOSPITAL_COMMUNITY): Payer: Self-pay | Admitting: Family Medicine

## 2013-05-21 DIAGNOSIS — R52 Pain, unspecified: Secondary | ICD-10-CM

## 2013-06-06 ENCOUNTER — Other Ambulatory Visit (HOSPITAL_COMMUNITY): Payer: Self-pay | Admitting: Family Medicine

## 2013-06-06 ENCOUNTER — Ambulatory Visit (HOSPITAL_COMMUNITY)
Admission: RE | Admit: 2013-06-06 | Discharge: 2013-06-06 | Disposition: A | Discharge status: Home / Self Care | Payer: PRIVATE HEALTH INSURANCE | Source: Ambulatory Visit | Attending: Family Medicine | Admitting: Family Medicine

## 2013-06-06 DIAGNOSIS — R52 Pain, unspecified: Secondary | ICD-10-CM

## 2013-06-06 DIAGNOSIS — N644 Mastodynia: Secondary | ICD-10-CM | POA: Insufficient documentation

## 2013-06-06 DIAGNOSIS — N63 Unspecified lump in unspecified breast: Secondary | ICD-10-CM | POA: Insufficient documentation

## 2013-07-11 ENCOUNTER — Encounter: Payer: Self-pay | Admitting: Physician Assistant

## 2013-07-11 ENCOUNTER — Ambulatory Visit (INDEPENDENT_AMBULATORY_CARE_PROVIDER_SITE_OTHER): Payer: PRIVATE HEALTH INSURANCE | Admitting: Physician Assistant

## 2013-07-11 VITALS — BP 121/61 | HR 76 | Ht 64.0 in | Wt 294.0 lb

## 2013-07-11 DIAGNOSIS — I429 Cardiomyopathy, unspecified: Secondary | ICD-10-CM

## 2013-07-11 DIAGNOSIS — R42 Dizziness and giddiness: Secondary | ICD-10-CM

## 2013-07-11 DIAGNOSIS — D649 Anemia, unspecified: Secondary | ICD-10-CM

## 2013-07-11 DIAGNOSIS — R002 Palpitations: Secondary | ICD-10-CM

## 2013-07-11 DIAGNOSIS — Z6841 Body Mass Index (BMI) 40.0 and over, adult: Secondary | ICD-10-CM

## 2013-07-11 DIAGNOSIS — I10 Essential (primary) hypertension: Secondary | ICD-10-CM

## 2013-07-11 NOTE — Assessment & Plan Note (Addendum)
Patient has no evidence of heart failure. She had recent labs by Dr. Berdine Addison so I will not repeat them.

## 2013-07-11 NOTE — Patient Instructions (Signed)
Your physician recommends that you schedule a follow-up appointment in: July with Dr [name]  Your physician has recommended that you wear an event monitor. Event monitors are medical devices that record the heart's electrical activity. Doctors most often [place] these monitors to diagnose arrhythmias. Arrhythmias are problems with the speed or rhythm of the heartbeat. The monitor is a small, portable device. You can wear one while you do your normal daily activities. This is usually used to diagnose what is causing palpitations/syncope (passing out).  [employer] will mail the device to your house. Please open the pamphlet and read directions on how to put the device on. If you have any questions please call the number on the [employer] box.

## 2013-07-11 NOTE — Assessment & Plan Note (Signed)
Recommend weight loss program

## 2013-07-11 NOTE — Assessment & Plan Note (Signed)
Blood pressure controlled. 

## 2013-07-11 NOTE — Progress Notes (Signed)
HPI: This is a 68 year old female patient Dr. Crissie Sickles who has a nonischemic cardiomyopathy status post ICD implantation complicated by dislodgment of her defibrillator lead x 2 with multiple ICD shocks secondary to low dislodgment. Her device was removed. her ejection fraction has been variable from 20-25% many years ago to normalization , but most recent ejection fraction was 45% 05/2011. She also has hypertension, chronic systolic heart failure, and obesity as well as hypothyroidism.  Patient comes in today for routine checkup. She says she's had some occasional dizziness or a fading sensation. She also occasionally feels her heart stop. These events are short lived and she has had no syncope. It happens once or twice a month. She's always been anemic but 2 months ago was told her hemoglobin was down to 8. She's been trying to take iron but can only take it about twice a week because of severe constipation. She denies any chest pain, dyspnea, dyspnea on exertion, or edema. She does water aerobics regularly.   Allergy-- Doxycycline -- Rash  Current Outpatient Prescriptions on File Prior to Visit: aspirin 81 MG tablet, Take 81 mg by mouth at bedtime. , Disp: , Rfl:  atorvastatin (LIPITOR) 10 MG tablet, , Disp: , Rfl:  carvedilol (COREG) 25 MG tablet, Take 0.5 tablets (12.5 mg total) by mouth 2 (two) times daily., Disp: 60 tablet, Rfl: 11 Cholecalciferol (VITAMIN D3) 1000 UNITS CAPS, Take 1 capsule by mouth every evening. , Disp: , Rfl:  CINNAMON PO, Take 2 capsules by mouth every morning. , Disp: , Rfl:  docusate sodium (COLACE) 100 MG capsule, Take 100 mg by mouth 2 (two) times daily., Disp: , Rfl:  ferrous fumarate (HEMOCYTE - 106 MG FE) 325 (106 FE) MG TABS, Take 1 tablet by mouth 2 (two) times a week. , Disp: , Rfl:  Flaxseed, Linseed, (FLAX SEED OIL PO), Take 1 capsule by mouth every morning. , Disp: , Rfl:  furosemide (LASIX) 40 MG tablet, Take 40 mg by mouth daily as needed. For fluid  retention, Disp: , Rfl:  glipiZIDE (GLUCOTROL) 5 MG tablet, Take 5 mg by mouth daily before breakfast. , Disp: , Rfl:  levothyroxine (SYNTHROID, LEVOTHROID) 50 MCG tablet, Take 88 mcg by mouth every morning. , Disp: , Rfl:  metFORMIN (GLUCOPHAGE) 1000 MG tablet, Take 1,000 mg by mouth 2 (two) times daily.  , Disp: , Rfl:  pantoprazole (PROTONIX) 40 MG tablet, Take 40 mg by mouth as needed. For acid reflux/ GERD, Disp: , Rfl:  spironolactone (ALDACTONE) 12.5 mg TABS tablet, Take 0.5 tablets (12.5 mg total) by mouth daily., Disp: 30 tablet, Rfl: 6 tetrahydrozoline (EYE DROPS) 0.05 % ophthalmic solution, Place 1 drop into both eyes daily as needed. For dry eye relief, Disp: , Rfl:  valsartan (DIOVAN) 160 MG tablet, Take 80 mg by mouth at bedtime. , Disp: , Rfl:   No current facility-administered medications on file prior to visit.   Past Medical History:   Systolic heart failure                                         Comment:Chronic   Overweight  Cardiomyopathy secondary                                     Hypertension                                                   Comment:takes Diovan and Spirololactone daily   Dysrhythmia                                                    Comment:pt takes Digoxin and Carvedilol daily   CHF (congestive heart failure)                                 Comment:takes Lasix prn   Sleep apnea                                                    Comment:doesn't use CPAP   Pneumonia                                                      Comment:hx of;many yrs ago   Peripheral neuropathy                                        Arthritis                                                      Comment:ankles   Gout                                                           Comment:hx of x 1 time in Aug 2012   GERD (gastroesophageal reflux disease)                         Comment:takes Protonix seldom   H/O hiatal hernia                                             Hemorrhoid  Anemia                                                         Comment:takes Ferrous Fumarate 2 times week   Blood transfusion                                              Comment:hx of-86yrs ago   Thyroid nodule                                                 Comment:right   Diabetes mellitus                                              Comment:takes Glipizide and Metformin daily   Cataract                                                       Comment:bilateral and immature  Past Surgical History:   Cardiac defirillator removed                                    Comment:removed <39months;removed d/t shocking pt               15times;pierced sac of heart 1000cc ;drained   stomach stapling surgery                         80's         APPENDECTOMY                                     1991         tummy tuck                                       1991         leg lipoma                                                    ABDOMINAL HYSTERECTOMY                           1991         TUBAL LIGATION  1971         ovarian cyst removed                             [place]         COLONOSCOPY                                                   THYROID LOBECTOMY                                07/27/2011       Comment:Procedure: THYROID LOBECTOMY;  Surgeon: Gayland Curry, MD,FACS;  Location: [facility];  Service:               General;  Laterality: Right;   CARDIAC DEFIBRILLATOR PLACEMENT                  >64yrs ago    defibrillator removed                                        Review of patient's family history indicates:   Anesthesia problems            Neg Hx                   Hypotension                    Neg Hx                   Malignant hyperthermia         Neg Hx                    Pseudochol deficiency          Neg Hx                   Social History   Marital Status: Widowed             Spouse Name:                      Years of Education:                 Number of children:             Occupational History   None on file  Social History Main Topics   Smoking Status: Never Smoker                     Smokeless Status: Not on file                      Alcohol Use: Yes               Comment: glass wine occasionally  Drug Use: No             Sexual Activity: Yes                    Birth Control/Protection: Surgical  Other Topics            Concern   None on file  Social History Narrative   None on file    ROS: See history of present illness otherwise negative   PHYSICAL EXAM: Obese, in no acute distress. Neck: No JVD, HJR, Bruit, or thyroid enlargement  Lungs: No tachypnea, clear without wheezing, rales, or rhonchi  Cardiovascular: RRR, PMI not displaced, heart sounds distant, no murmurs, gallops, bruit, thrill, or heave.  Abdomen: BS normal. Soft without organomegaly, masses,  lesions or tenderness.  Extremities:Trace of ankle edema bilaterally otherwise lower extremities without cyanosis, clubbing. Decreased distal pulses bilateral  SKin: Warm, no lesions or rashes   Musculoskeletal: No deformities  Neuro: no focal signs  BP 121/61  Pulse 76  Ht 5\' 4"  (1.626 m)  Wt 294 lb (133.358 kg)  BMI 50.44 kg/m2    EKG: Normal sinus rhythm with poor R wave progression consistent with an anterior lateral infarct  2-D echo 06/07/11 Study Conclusions  - Left ventricle: The cavity size was normal. Wall thickness   was normal. Systolic function was mildly to moderately   reduced. The estimated ejection fraction was in the range   of 40% to 45%. Diffuse hypokinesis. Doppler parameters are   consistent with abnormal left ventricular relaxation   (grade 1 diastolic dysfunction). - Aortic valve: Mildly calcified annulus. Trileaflet. - Mitral  valve: Trivial regurgitation. - Left atrium: The atrium was mildly dilated. - Right atrium: The Eustachian valve appeared prominant. - Tricuspid valve: Mild regurgitation. - Pericardium, extracardiac: A trivial pericardial effusion   was identified posterior to the heart.

## 2013-07-11 NOTE — Assessment & Plan Note (Signed)
Patient has long history of anemia. Hemoglobin was down to 8.02 months ago. She sees Dr. Berdine Addison back next week for followup.

## 2013-07-11 NOTE — Assessment & Plan Note (Signed)
Patient has had some dizziness with sensation of her heart stopping. Some of her dizziness could be related to her anemia but with her cardiomyopathy recommend 30 day event recorder. Followup with Dr. [name] in July

## 2013-07-17 ENCOUNTER — Encounter (INDEPENDENT_AMBULATORY_CARE_PROVIDER_SITE_OTHER): Payer: Self-pay | Admitting: General Surgery

## 2013-07-18 ENCOUNTER — Telehealth: Payer: Self-pay

## 2013-07-18 NOTE — Telephone Encounter (Signed)
Insurance will not cover event monitor,loop monitor to be mailed to pt per Administrator, Civil [employer] at [facility]

## 2013-07-23 DIAGNOSIS — R42 Dizziness and giddiness: Secondary | ICD-10-CM

## 2013-07-25 ENCOUNTER — Ambulatory Visit (INDEPENDENT_AMBULATORY_CARE_PROVIDER_SITE_OTHER): Payer: PRIVATE HEALTH INSURANCE | Admitting: General Surgery

## 2013-08-01 ENCOUNTER — Telehealth: Payer: Self-pay | Admitting: *Deleted

## 2013-08-01 ENCOUNTER — Other Ambulatory Visit: Payer: Self-pay | Admitting: *Deleted

## 2013-08-01 DIAGNOSIS — R42 Dizziness and giddiness: Secondary | ICD-10-CM

## 2013-08-01 DIAGNOSIS — I5022 Chronic systolic (congestive) heart failure: Secondary | ICD-10-CM

## 2013-08-01 DIAGNOSIS — I10 Essential (primary) hypertension: Secondary | ICD-10-CM

## 2013-08-01 DIAGNOSIS — I429 Cardiomyopathy, unspecified: Secondary | ICD-10-CM

## 2013-08-01 NOTE — Telephone Encounter (Signed)
Pt made aware to get labs drawn

## 2013-08-01 NOTE — Telephone Encounter (Signed)
Rhythm strips reviewed. Check BMET, CBC, and magnesium.

## 2013-08-01 NOTE — Telephone Encounter (Signed)
EJ from [facility] called with a serious EKG. PVC's Bigeminy  34 in 1 duration. EJ will fax to the office. Will show Dr Bronson Ing once received.   Called pt to see how she was feeling. Pt states she is "ok".   Pt was laying in bed and got up to go to the bathroom.

## 2013-08-03 LAB — CBC
HCT: 26.6 % — ABNORMAL LOW (ref 36.0–46.0)
HEMOGLOBIN: 8.4 g/dL — AB (ref 12.0–15.0)
MCH: 23.1 pg — AB (ref 26.0–34.0)
MCHC: 31.6 g/dL (ref 30.0–36.0)
MCV: 73.3 fL — ABNORMAL LOW (ref 78.0–100.0)
Platelets: 163 10*3/uL (ref 150–400)
RBC: 3.63 MIL/uL — ABNORMAL LOW (ref 3.87–5.11)
RDW: 18.9 % — ABNORMAL HIGH (ref 11.5–15.5)
WBC: 3.4 10*3/uL — ABNORMAL LOW (ref 4.0–10.5)

## 2013-08-03 LAB — BASIC METABOLIC PANEL
BUN: 30 mg/dL — ABNORMAL HIGH (ref 6–23)
CHLORIDE: 102 meq/L (ref 96–112)
CO2: 24 meq/L (ref 19–32)
CREATININE: 1.29 mg/dL — AB (ref 0.50–1.10)
Calcium: 9.5 mg/dL (ref 8.4–10.5)
Glucose, Bld: 83 mg/dL (ref 70–99)
Potassium: 4.6 mEq/L (ref 3.5–5.3)
Sodium: 137 mEq/L (ref 135–145)

## 2013-08-03 LAB — MAGNESIUM: MAGNESIUM: 2.1 mg/dL (ref 1.5–2.5)

## 2013-08-06 ENCOUNTER — Telehealth: Payer: Self-pay

## 2013-08-06 DIAGNOSIS — I5022 Chronic systolic (congestive) heart failure: Secondary | ICD-10-CM

## 2013-08-06 DIAGNOSIS — I493 Ventricular premature depolarization: Secondary | ICD-10-CM

## 2013-08-06 MED ORDER — CARVEDILOL 25 MG PO TABS: 25.0000 mg | ORAL_TABLET | Freq: Two times a day (BID) | ORAL | Status: DC

## 2013-08-06 NOTE — Telephone Encounter (Signed)
Just now received call from [facility] pt had auto recording at 10:25 am of multifocal pvc's, 39 in one minute. I called pt she is watching "Elly Modena" on TV and has no sx's  They will fax report and I will show to Dr.Branch   12:55 pm per Dr.Branch, increase Coreg to 25 mg bid and have echo on 5/19 here,then fu in 3 weeks with him in [place] office on 6/29 at 4 pm.pt aware and agrees with dispo

## 2013-08-07 ENCOUNTER — Ambulatory Visit (HOSPITAL_COMMUNITY)
Admission: RE | Admit: 2013-08-07 | Discharge: 2013-08-07 | Disposition: A | Discharge status: Home / Self Care | Payer: PRIVATE HEALTH INSURANCE | Source: Ambulatory Visit | Attending: Cardiology | Admitting: Cardiology

## 2013-08-07 DIAGNOSIS — I10 Essential (primary) hypertension: Secondary | ICD-10-CM | POA: Insufficient documentation

## 2013-08-07 DIAGNOSIS — I4949 Other premature depolarization: Secondary | ICD-10-CM | POA: Insufficient documentation

## 2013-08-07 DIAGNOSIS — I493 Ventricular premature depolarization: Secondary | ICD-10-CM

## 2013-08-07 DIAGNOSIS — R079 Chest pain, unspecified: Secondary | ICD-10-CM | POA: Diagnosis present

## 2013-08-07 DIAGNOSIS — Z6841 Body Mass Index (BMI) 40.0 and over, adult: Secondary | ICD-10-CM | POA: Diagnosis not present

## 2013-08-07 DIAGNOSIS — I429 Cardiomyopathy, unspecified: Secondary | ICD-10-CM | POA: Insufficient documentation

## 2013-08-07 DIAGNOSIS — I517 Cardiomegaly: Secondary | ICD-10-CM

## 2013-08-07 NOTE — Progress Notes (Signed)
  Echocardiogram 2D Echocardiogram has been performed.  Su Grand Enola Siebers 08/07/2013, 3:09 PM

## 2013-08-22 ENCOUNTER — Emergency Department (HOSPITAL_COMMUNITY)
Admission: EM | Admit: 2013-08-22 | Discharge: 2013-08-23 | Disposition: A | Discharge status: Home / Self Care | Payer: PRIVATE HEALTH INSURANCE | Attending: Emergency Medicine | Admitting: Emergency Medicine

## 2013-08-22 ENCOUNTER — Encounter (HOSPITAL_COMMUNITY): Payer: Self-pay | Admitting: Emergency Medicine

## 2013-08-22 DIAGNOSIS — R0602 Shortness of breath: Secondary | ICD-10-CM | POA: Insufficient documentation

## 2013-08-22 DIAGNOSIS — E119 Type 2 diabetes mellitus without complications: Secondary | ICD-10-CM | POA: Insufficient documentation

## 2013-08-22 DIAGNOSIS — D649 Anemia, unspecified: Secondary | ICD-10-CM | POA: Insufficient documentation

## 2013-08-22 DIAGNOSIS — Z8719 Personal history of other diseases of the digestive system: Secondary | ICD-10-CM | POA: Insufficient documentation

## 2013-08-22 DIAGNOSIS — I5022 Chronic systolic (congestive) heart failure: Secondary | ICD-10-CM | POA: Insufficient documentation

## 2013-08-22 DIAGNOSIS — I509 Heart failure, unspecified: Secondary | ICD-10-CM | POA: Insufficient documentation

## 2013-08-22 DIAGNOSIS — Z7982 Long term (current) use of aspirin: Secondary | ICD-10-CM | POA: Insufficient documentation

## 2013-08-22 DIAGNOSIS — Z792 Long term (current) use of antibiotics: Secondary | ICD-10-CM | POA: Insufficient documentation

## 2013-08-22 DIAGNOSIS — Z95810 Presence of automatic (implantable) cardiac defibrillator: Secondary | ICD-10-CM | POA: Insufficient documentation

## 2013-08-22 DIAGNOSIS — E041 Nontoxic single thyroid nodule: Secondary | ICD-10-CM | POA: Insufficient documentation

## 2013-08-22 DIAGNOSIS — Z8669 Personal history of other diseases of the nervous system and sense organs: Secondary | ICD-10-CM | POA: Insufficient documentation

## 2013-08-22 DIAGNOSIS — Z8701 Personal history of pneumonia (recurrent): Secondary | ICD-10-CM | POA: Insufficient documentation

## 2013-08-22 DIAGNOSIS — I10 Essential (primary) hypertension: Secondary | ICD-10-CM | POA: Insufficient documentation

## 2013-08-22 DIAGNOSIS — E663 Overweight: Secondary | ICD-10-CM | POA: Insufficient documentation

## 2013-08-22 DIAGNOSIS — M109 Gout, unspecified: Secondary | ICD-10-CM | POA: Insufficient documentation

## 2013-08-22 DIAGNOSIS — Z79899 Other long term (current) drug therapy: Secondary | ICD-10-CM | POA: Insufficient documentation

## 2013-08-22 DIAGNOSIS — B029 Zoster without complications: Secondary | ICD-10-CM

## 2013-08-22 MED ORDER — ONDANSETRON HCL 4 MG PO TABS
4.0000 mg | ORAL_TABLET | Freq: Once | ORAL | Status: AC
  Administered 2013-08-22: 4 mg via ORAL
  Filled 2013-08-22: qty 1

## 2013-08-22 MED ORDER — CEPHALEXIN 500 MG PO CAPS: 500.0000 mg | ORAL_CAPSULE | Freq: Four times a day (QID) | ORAL | Status: DC

## 2013-08-22 MED ORDER — CEPHALEXIN 500 MG PO CAPS
500.0000 mg | ORAL_CAPSULE | Freq: Once | ORAL | Status: AC
  Administered 2013-08-22: 500 mg via ORAL
  Filled 2013-08-22: qty 1

## 2013-08-22 MED ORDER — ACYCLOVIR 800 MG PO TABS
ORAL_TABLET | ORAL | Status: AC
  Filled 2013-08-22: qty 1

## 2013-08-22 MED ORDER — ACYCLOVIR 200 MG PO CAPS
400.0000 mg | ORAL_CAPSULE | Freq: Once | ORAL | Status: AC
  Administered 2013-08-22: 400 mg via ORAL
  Filled 2013-08-22: qty 2

## 2013-08-22 MED ORDER — HYDROCODONE-ACETAMINOPHEN 5-325 MG PO TABS: ORAL_TABLET | ORAL | Status: DC

## 2013-08-22 MED ORDER — ACYCLOVIR 800 MG PO TABS: 800.0000 mg | ORAL_TABLET | Freq: Every day | ORAL | Status: DC

## 2013-08-22 NOTE — Discharge Instructions (Signed)
Please use a medicated powder under the breasts 2 or 3 times daily to promote dryness. Please use acyclovir 5 times daily. Use Norco one or 2 tablets every 4 hours as needed for pain. This medication may cause drowsiness, please use caution when being up and about while taking this medication. Use Keflex 4 times daily with food. Please have the rash rechecked by Dr. Berdine Addison. Please return to the emergency department immediately if any fever or, or signs of advancing infection under the breasts. Shingles Shingles is caused by the same virus that causes chickenpox. The first feelings may be pain or tingling. A rash will follow in a couple days. The rash may occur on any area of the body. Long-lasting pain is more likely in an elderly person. It can last months to years. There are medicines that can help prevent pain if you start taking them early. HOME CARE   Place cool cloths on the rash.  Only take medicine as told by your doctor.  You may use calamine lotion to relieve itchy skin.  Avoid touching:  Babies.  Children with inflamed skin (eczema).  People who have gotten transplanted organs.  People with chronic illnesses, such as leukemia and AIDS.  If the rash is on the face, you may need to see a specialist. Keep all appointments. Shingles must be kept away from the eyes, if possible.  Keep all appointments.  Avoid touching the eyes or eye area, if possible. GET HELP RIGHT AWAY IF:   You have any pain on the face or eye.  Your medicines do not help.  Your redness or puffiness (swelling) spreads.  You have a fever.  You notice any red lines going away from the rash area. MAKE SURE YOU:   Understand these instructions.  Will watch your condition.  Will get help right away if you are not doing well or get worse. Document Released: 08/25/2007 Document Revised: 05/31/2011 Document Reviewed: 08/25/2007 [facility] Patient Information 2014 [place], [place].

## 2013-08-22 NOTE — ED Notes (Signed)
Rash noted under the left breast. Some bump have a clear drainage noted.

## 2013-08-22 NOTE — ED Notes (Signed)
Pt c/o rash under left breast x 4 days.

## 2013-08-23 NOTE — ED Notes (Signed)
Pt alert & oriented x4, stable gait. Patient given discharge instructions, paperwork & prescription(s). Patient  instructed to stop at the registration desk to finish any additional paperwork. Patient verbalized understanding. Pt left department w/ no further questions. 

## 2013-08-23 NOTE — ED Provider Notes (Signed)
Medical screening examination/treatment/procedure(s) were performed by non-physician practitioner and as supervising physician I was immediately available for consultation/collaboration.   EKG Interpretation None       Virgel Manifold, MD 08/23/13 [phone]

## 2013-08-23 NOTE — ED Provider Notes (Signed)
CSN: [id number]     Arrival date & time 08/22/13  2227 History   First MD Initiated Contact with Patient 08/22/13 2311     Chief Complaint  Patient presents with  . Rash     (Consider location/radiation/quality/duration/timing/severity/associated sxs/prior Treatment) HPI Comments: The patient is a 68 y/o female presents to the emergency department with a complaint of rash under her breasts. The patient states this has been going on for about 4 days. It is getting progressively worse. She's not had any fever or chills with it, but states that the pain is getting unbearable. She frequently has to put cotton balls between her breasts and her body because of the discomfort. She now has some open wounds underneath the breast area she states there is minimal drainage from these. It is of note that the patient is a diabetic. She has tried over-the-counter Tylenol and over-the-counter antibiotic creams but these are not helping.   Patient is a 68 y.o. female presenting with rash. The history is provided by the patient.  Rash Associated symptoms: joint pain and shortness of breath   Associated symptoms: no abdominal pain and not wheezing     Past Medical History  Diagnosis Date  . Systolic heart failure     Chronic  . Overweight   . Cardiomyopathy secondary   . Hypertension     takes Diovan and Spirololactone daily  . Dysrhythmia     pt takes Digoxin and Carvedilol daily  . CHF (congestive heart failure)     takes Lasix prn  . Sleep apnea     doesn't use CPAP  . Pneumonia     hx of;many yrs ago  . Peripheral neuropathy   . Arthritis     ankles  . Gout     hx of x 1 time in Aug 2012  . GERD (gastroesophageal reflux disease)     takes Protonix seldom  . H/O hiatal hernia   . Hemorrhoid   . Anemia     takes Ferrous Fumarate 2 times week  . Blood transfusion     hx of-88yrs ago  . Thyroid nodule     right  . Diabetes mellitus     takes Glipizide and Metformin daily  . Cataract      bilateral and immature   Past Surgical History  Procedure Laterality Date  . Cardiac defirillator removed      removed <71months;removed d/t shocking pt 15times;pierced sac of heart 1000cc ;drained  . Stomach stapling surgery  80's  . Appendectomy  1991  . Tummy tuck  1991  . Leg lipoma    . Abdominal hysterectomy  1991  . Tubal ligation  1971  . Ovarian cyst removed  1991  . Cholecystectomy open  1992  . Colonoscopy    . Thyroid lobectomy  07/27/2011    Procedure: THYROID LOBECTOMY;  Surgeon: Gayland Curry, MD,FACS;  Location: [facility];  Service: General;  Laterality: Right;  . Cardiac defibrillator placement  >70yrs ago  . Defibrillator removed     Family History  Problem Relation Age of Onset  . Anesthesia problems Neg Hx   . Hypotension Neg Hx   . Malignant hyperthermia Neg Hx   . Pseudochol deficiency Neg Hx    History  Substance Use Topics  . Smoking status: Never Smoker   . Smokeless tobacco: Not on file  . Alcohol Use: Yes     Comment: glass wine occasionally   OB History   Grav Para  Term Preterm Abortions TAB SAB Ect Mult Living                 Review of Systems  Constitutional: Negative for activity change.       All ROS Neg except as noted in HPI  HENT: Negative for nosebleeds.   Eyes: Negative for photophobia and discharge.  Respiratory: Positive for shortness of breath. Negative for cough and wheezing.   Cardiovascular: Negative for chest pain and palpitations.  Gastrointestinal: Negative for abdominal pain and blood in stool.  Genitourinary: Negative for dysuria, frequency and hematuria.  Musculoskeletal: Positive for arthralgias. Negative for back pain and neck pain.  Skin: Positive for rash.  Neurological: Negative for dizziness, seizures and speech difficulty.  Psychiatric/Behavioral: Negative for hallucinations and confusion.      Allergies  Doxycycline  Home Medications   Prior to Admission medications   Medication Sig Start Date End Date  Taking? Authorizing Provider  acyclovir (ZOVIRAX) 800 MG tablet Take 1 tablet (800 mg total) by mouth 5 (five) times daily. 08/22/13   Lenox Ahr, PA-C  aspirin 81 MG tablet Take 81 mg by mouth at bedtime.     Historical Provider, MD  atorvastatin (LIPITOR) 10 MG tablet Take 10 mg by mouth daily at 6 PM.  01/09/13   Historical Provider, MD  carvedilol (COREG) 25 MG tablet Take 1 tablet (25 mg total) by mouth 2 (two) times daily. 08/06/13 04/17/15  Arnoldo Lenis, MD  cephALEXin (KEFLEX) 500 MG capsule Take 1 capsule (500 mg total) by mouth 4 (four) times daily. 08/22/13   Lenox Ahr, PA-C  Cholecalciferol (VITAMIN D3) 1000 UNITS CAPS Take 1 capsule by mouth every evening.     Historical Provider, MD  CINNAMON PO Take 2 capsules by mouth every morning.     Historical Provider, MD  docusate sodium (COLACE) 100 MG capsule Take 100 mg by mouth 2 (two) times daily.    Historical Provider, MD  ferrous fumarate (HEMOCYTE - 106 MG FE) 325 (106 FE) MG TABS Take 1 tablet by mouth 2 (two) times a week.     Historical Provider, MD  Flaxseed, Linseed, (FLAX SEED OIL PO) Take 1 capsule by mouth every morning.     Historical Provider, MD  furosemide (LASIX) 40 MG tablet Take 40 mg by mouth daily as needed. For fluid retention    Historical Provider, MD  glipiZIDE (GLUCOTROL) 5 MG tablet Take 5 mg by mouth daily before breakfast.  11/23/12   Historical Provider, MD  HYDROcodone-acetaminophen (NORCO/VICODIN) 5-325 MG per tablet 1 or 2 po q4h prn pain 08/22/13   Lenox Ahr, PA-C  HYDROcodone-acetaminophen (NORCO/VICODIN) 5-325 MG per tablet 1 or 2 tabs po q4h prn pain 08/22/13   Lenox Ahr, PA-C  levothyroxine (SYNTHROID, LEVOTHROID) 50 MCG tablet Take 100 mcg by mouth every morning.     Historical Provider, MD  metFORMIN (GLUCOPHAGE) 1000 MG tablet Take 1,000 mg by mouth 2 (two) times daily.      Historical Provider, MD  spironolactone (ALDACTONE) 12.5 mg TABS tablet Take 0.5 tablets (12.5 mg total) by  mouth daily. 02/19/13   Lendon Colonel, NP  tetrahydrozoline (EYE DROPS) 0.05 % ophthalmic solution Place 1 drop into both eyes daily as needed. For dry eye relief    Historical Provider, MD  valsartan (DIOVAN) 160 MG tablet Take 80 mg by mouth at bedtime.     Historical Provider, MD   BP 126/74  Pulse 79  Temp(Src) 97.6  F (36.4 C) (Oral)  Resp 16  Ht 5\' 4"  (1.626 m)  Wt 290 lb (131.543 kg)  BMI 49.75 kg/m2  SpO2 100% Physical Exam  Nursing note and vitals reviewed. Constitutional: She is oriented to person, place, and time. She appears well-developed and well-nourished.  Non-toxic appearance.  HENT:  Head: Normocephalic.  Right Ear: Tympanic membrane and external ear normal.  Left Ear: Tympanic membrane and external ear normal.  Eyes: EOM and lids are normal. Pupils are equal, round, and reactive to light.  Neck: Normal range of motion. Neck supple. Carotid bruit is not present.  Cardiovascular: Normal rate, regular rhythm, normal heart sounds, intact distal pulses and normal pulses.   Pulmonary/Chest: Breath sounds normal. No respiratory distress.  There is several raised red areas with denuded tops present under the left breast. It appears that these may have been blisters before a portion of the blister was denuded. There no red streaks appreciated. There is minimal drainage. There is no rash noted on the breast itself. The rash is under the left breast only. The rash is painful to even light touch.  Abdominal: Soft. Bowel sounds are normal. There is no tenderness. There is no guarding.  Musculoskeletal: Normal range of motion.  Lymphadenopathy:       Head (right side): No submandibular adenopathy present.       Head (left side): No submandibular adenopathy present.    She has no cervical adenopathy.  Neurological: She is alert and oriented to person, place, and time. She has normal strength. No cranial nerve deficit or sensory deficit.  Skin: Skin is warm and dry.   Psychiatric: She has a normal mood and affect. Her speech is normal.    ED Course  Procedures (including critical care time) Labs Review Labs Reviewed - No data to display  Imaging Review No results found.   EKG Interpretation None      MDM The examination is consistent with shingles. The plan at this time is for the patient be treated with acyclovir 800 mg 5 times daily, Norco every 4 hours, medicated powder, and Keflex 4 times daily for possible secondary infection given the denuded areas in the history of diabetes. Patient is to followup with primary physician as sone as possible. I've also advised patient to use medicated powder 2 improved dryness.    Final diagnoses:  Shingles    **I have reviewed nursing notes, vital signs, and all appropriate lab and imaging results for this patient.Lenox Ahr, PA-C 08/23/13 [phone]

## 2013-08-28 ENCOUNTER — Ambulatory Visit: Payer: PRIVATE HEALTH INSURANCE | Admitting: Cardiology

## 2013-08-28 MED FILL — Hydrocodone-Acetaminophen Tab 5-325 MG: ORAL | Qty: 6 | Status: AC

## 2013-08-28 NOTE — Progress Notes (Unsigned)
Clinical Summary Ms. Lewi is a 68 y.o.female last seen by PA Lenze, this is our first visit together. She is seen for the following medical problems.   1. NICM - echo 07/2013 LVEF 40-45%, mod to severe LVH.  - previously had ICD, had troubles with lead dislodgment and inappropriate shocks in the past and device was removed. Her LVEF has improved and she has not required consideration for another device.    2. HTN   3. Dizziness/Palpitations - event monitor ordered at last visit, showed NSR with frequent PVCs.  -    Past Medical History  Diagnosis Date  . Systolic heart failure     Chronic  . Overweight   . Cardiomyopathy secondary   . Hypertension     takes Diovan and Spirololactone daily  . Dysrhythmia     pt takes Digoxin and Carvedilol daily  . CHF (congestive heart failure)     takes Lasix prn  . Sleep apnea     doesn't use CPAP  . Pneumonia     hx of;many yrs ago  . Peripheral neuropathy   . Arthritis     ankles  . Gout     hx of x 1 time in Aug 2012  . GERD (gastroesophageal reflux disease)     takes Protonix seldom  . H/O hiatal hernia   . Hemorrhoid   . Anemia     takes Ferrous Fumarate 2 times week  . Blood transfusion     hx of-61yrs ago  . Thyroid nodule     right  . Diabetes mellitus     takes Glipizide and Metformin daily  . Cataract     bilateral and immature     Allergies  Allergen Reactions  . Doxycycline Rash     Current Outpatient Prescriptions  Medication Sig Dispense Refill  . acyclovir (ZOVIRAX) 800 MG tablet Take 1 tablet (800 mg total) by mouth 5 (five) times daily.  35 tablet  0  . aspirin 81 MG tablet Take 81 mg by mouth at bedtime.       [name] atorvastatin (LIPITOR) 10 MG tablet Take 10 mg by mouth daily at 6 PM.       . carvedilol (COREG) 25 MG tablet Take 1 tablet (25 mg total) by mouth 2 (two) times daily.  60 tablet  11  . cephALEXin (KEFLEX) 500 MG capsule Take 1 capsule (500 mg total) by mouth 4 (four) times  daily.  20 capsule  0  . Cholecalciferol (VITAMIN D3) 1000 UNITS CAPS Take 1 capsule by mouth every evening.       [name] CINNAMON PO Take 2 capsules by mouth every morning.       . docusate sodium (COLACE) 100 MG capsule Take 100 mg by mouth 2 (two) times daily.      . ferrous fumarate (HEMOCYTE - 106 MG FE) 325 (106 FE) MG TABS Take 1 tablet by mouth 2 (two) times a week.       . Flaxseed, Linseed, (FLAX SEED OIL PO) Take 1 capsule by mouth every morning.       . furosemide (LASIX) 40 MG tablet Take 40 mg by mouth daily as needed. For fluid retention      . glipiZIDE (GLUCOTROL) 5 MG tablet Take 5 mg by mouth daily before breakfast.       . HYDROcodone-acetaminophen (NORCO/VICODIN) 5-325 MG per tablet 1 or 2 po q4h prn pain  6 tablet  0  .  HYDROcodone-acetaminophen (NORCO/VICODIN) 5-325 MG per tablet 1 or 2 tabs po q4h prn pain  20 tablet  0  . levothyroxine (SYNTHROID, LEVOTHROID) 50 MCG tablet Take 100 mcg by mouth every morning.       . metFORMIN (GLUCOPHAGE) 1000 MG tablet Take 1,000 mg by mouth 2 (two) times daily.        [name] spironolactone (ALDACTONE) 12.5 mg TABS tablet Take 0.5 tablets (12.5 mg total) by mouth daily.  30 tablet  6  . tetrahydrozoline (EYE DROPS) 0.05 % ophthalmic solution Place 1 drop into both eyes daily as needed. For dry eye relief      . valsartan (DIOVAN) 160 MG tablet Take 80 mg by mouth at bedtime.        No current facility-administered medications for this visit.     Past Surgical History  Procedure Laterality Date  . Cardiac defirillator removed      removed <36months;removed d/t shocking pt 15times;pierced sac of heart 1000cc ;drained  . Stomach stapling surgery  80's  . Appendectomy  1991  . Tummy tuck  1991  . Leg lipoma    . Abdominal hysterectomy  1991  . Tubal ligation  1971  . Ovarian cyst removed  1991  . Cholecystectomy open  1992  . Colonoscopy    . Thyroid lobectomy  07/27/2011    Procedure: THYROID LOBECTOMY;  Surgeon: Gayland Curry, MD,FACS;   Location: [place];  Service: General;  Laterality: Right;  . Cardiac defibrillator placement  >60yrs ago  . Defibrillator removed       Allergies  Allergen Reactions  . Doxycycline Rash      Family History  Problem Relation Age of Onset  . Anesthesia problems Neg Hx   . Hypotension Neg Hx   . Malignant hyperthermia Neg Hx   . Pseudochol deficiency Neg Hx      Social History Ms. Faubert reports that she has never smoked. She does not have any smokeless tobacco history on file. Ms. Reaves reports that she drinks alcohol.   Review of Systems CONSTITUTIONAL: No weight loss, fever, chills, weakness or fatigue.  HEENT: Eyes: No visual loss, blurred vision, double vision or yellow sclerae.No hearing loss, sneezing, congestion, runny nose or sore throat.  SKIN: No rash or itching.  CARDIOVASCULAR:  RESPIRATORY: No shortness of breath, cough or sputum.  GASTROINTESTINAL: No anorexia, nausea, vomiting or diarrhea. No abdominal pain or blood.  GENITOURINARY: No burning on urination, no polyuria NEUROLOGICAL: No headache, dizziness, syncope, paralysis, ataxia, numbness or tingling in the extremities. No change in bowel or bladder control.  MUSCULOSKELETAL: No muscle, back pain, joint pain or stiffness.  LYMPHATICS: No enlarged nodes. No history of splenectomy.  PSYCHIATRIC: No history of depression or anxiety.  ENDOCRINOLOGIC: No reports of sweating, cold or heat intolerance. No polyuria or polydipsia.  [name]   Physical Examination There were no vitals filed for this visit. There were no vitals filed for this visit.  Gen: resting comfortably, no acute distress HEENT: no scleral icterus, pupils equal round and reactive, no palptable cervical adenopathy,  CV Resp: Clear to auscultation bilaterally GI: abdomen is soft, non-tender, non-distended, normal bowel sounds, no hepatosplenomegaly MSK: extremities are warm, no edema.  Skin: warm, no rash Neuro:  no focal deficits Psych:  appropriate affect   Diagnostic Studies 08/07/13 Echo Study Conclusions  - Procedure narrative: Transthoracic echocardiography. Image quality was suboptimal. The study was technically difficult, as a result of poor sound wave transmission and body habitus. - Left  ventricle: The cavity size was normal. Wall thickness was increased in a pattern of moderate to severe LVH. Systolic function was mildly to moderately reduced. The estimated ejection fraction was in the range of 40% to 45%. Mild to moderate global hypokinesis was seen. There was an increased relative contribution of atrial contraction to ventricular filling. Doppler parameters are consistent with abnormal left ventricular relaxation (grade 1 diastolic dysfunction). - Aortic valve: Mildly calcified annulus. Trileaflet. - Left atrium: The atrium was mildly dilated. - Right atrium: The atrium was mildly dilated.      Assessment and Plan        Arnoldo Lenis, M.D., F.A.C.C.

## 2013-08-30 ENCOUNTER — Encounter (INDEPENDENT_AMBULATORY_CARE_PROVIDER_SITE_OTHER): Payer: Self-pay | Admitting: General Surgery

## 2013-08-30 ENCOUNTER — Ambulatory Visit (INDEPENDENT_AMBULATORY_CARE_PROVIDER_SITE_OTHER): Payer: PRIVATE HEALTH INSURANCE | Admitting: General Surgery

## 2013-08-30 VITALS — BP 110/70 | HR 73 | Temp 97.8°F | Resp 14 | Ht 64.0 in | Wt 292.0 lb

## 2013-08-30 DIAGNOSIS — E042 Nontoxic multinodular goiter: Secondary | ICD-10-CM

## 2013-08-30 DIAGNOSIS — E890 Postprocedural hypothyroidism: Secondary | ICD-10-CM

## 2013-08-30 DIAGNOSIS — Z9889 Other specified postprocedural states: Secondary | ICD-10-CM

## 2013-08-30 NOTE — Patient Instructions (Signed)
Follow up with Dr Buddy Duty for [facility] monitoring.

## 2013-08-30 NOTE — Progress Notes (Signed)
Subjective:     Patient ID: [name], female   DOB: 1946-03-15, 68 y.o.   MRN: [id number]  HPI 68 year old Latin American female comes in for long-term followup after undergoing a right thyroid lobectomy with isthmusectomy on 07/27/2011. I last saw her in the office on 06/28/2012. 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 She denies any significant medical changes other than some lightheadedness over the past months. They ended up adjusting some of her blood pressure medications. Her lightheadedness has resolved. She denies any constipation. She denies any heat or cold intolerance. She denies any neck pain. She denies any difficulty swallowing. She states that she continues to have issues intermittently with her voices giving out when singing for a prolonged period of time. She denies constipation. She denies any unplanned weight change.  She had a followup neck ultrasound in January 2015 which was stable.  I reviewed a note from Dr. Buddy Duty dated April 2015. Her TSH was 2.58 in January.  PMHx, PSHx, SOCHx, FAMHx, ALL reviewed  Review of Systems A 10 point Review of systems was performed and all systems are negative except for what is mentioned in the history of present illness     Objective:   Physical Exam BP 110/70  Pulse 73  Temp(Src) 97.8 F (36.6 C)  Resp 14  Ht 5\' 4"  (1.626 m)  Wt 292 lb (132.45 kg)  BMI 50.10 kg/m2  Gen: alert, NAD, non-toxic appearing Pupils: equal, no scleral icterus Neck: supple, no LAD, well healed kocher incision. No appreciable L thyromegaly.  Pulm: Lungs clear to auscultation, symmetric chest rise CV: regular rate and rhythm Abd: soft, nontender, nondistended.  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 We reviewed her  neck ultrasound from January. It showed stable subcentimeter left thyroid nodules. At this point with her being 2 years out from surgery I do not think she needs any  Additional surveillance ultrasounds. At this point I'll release her to the care of Dr. Buddy Duty for ongoing followup of her thyroid level. F/u as needed  Leighton Ruff. Redmond Pulling, MD, FACS General, Bariatric, & Minimally Invasive Surgery [facility], [place]

## 2013-09-20 ENCOUNTER — Ambulatory Visit (INDEPENDENT_AMBULATORY_CARE_PROVIDER_SITE_OTHER): Payer: PRIVATE HEALTH INSURANCE | Admitting: Cardiology

## 2013-09-20 ENCOUNTER — Encounter: Payer: Self-pay | Admitting: Cardiology

## 2013-09-20 VITALS — BP 140/78 | HR 74 | Ht 64.0 in | Wt 291.0 lb

## 2013-09-20 DIAGNOSIS — R002 Palpitations: Secondary | ICD-10-CM

## 2013-09-20 DIAGNOSIS — I5022 Chronic systolic (congestive) heart failure: Secondary | ICD-10-CM

## 2013-09-20 DIAGNOSIS — I493 Ventricular premature depolarization: Secondary | ICD-10-CM

## 2013-09-20 DIAGNOSIS — I4949 Other premature depolarization: Secondary | ICD-10-CM

## 2013-09-20 NOTE — Patient Instructions (Signed)
Your physician wants you to follow-up in: 6 months. You will receive a reminder letter in the mail two months in advance. If you don't receive a letter, please call our office to schedule the follow-up appointment.  Your physician recommends that you continue on your current medications as directed. Please refer to the Current Medication list given to you today.   Thank you for choosing [facility]!    

## 2013-09-20 NOTE — Progress Notes (Signed)
Clinical Summary [name] is a 68 y.o.female seen today for follow up of the following medical problems.   1. NICM  - echo 07/2013 LVEF 40-45%, mod to severe LVH.  - previously had ICD, had troubles with lead dislodgment and inappropriate shocks in the past and device was removed. Her LVEF has improved and she has not required consideration for another device.   - denies any SOB or DOE, no orthopnea, no PND, occas LE edema - compliant with meds.  - limiting sodium intake, no NSAIDs   2. HTN  - has not taken bp meds yet today, but reports overall compliance.  - does not check regularly  3. Dizziness/Palpitations  - event monitor ordered at [facility], showed NSR with frequent PVCs.  - repeated echo after PVCs noted on event monitor, LVEF remains mildly decreased. K and Mg were normal - symptoms have resolved since cutting back on meds  Past Medical History  Diagnosis Date  . Systolic heart failure     Chronic  . Overweight(278.02)   . Cardiomyopathy secondary   . Hypertension     takes Diovan and Spirololactone daily  . Dysrhythmia     pt takes Digoxin and Carvedilol daily  . CHF (congestive heart failure)     takes Lasix prn  . Sleep apnea     doesn't use CPAP  . Pneumonia     hx of;many yrs ago  . Peripheral neuropathy   . Arthritis     ankles  . Gout     hx of x 1 time in Aug 2012  . GERD (gastroesophageal reflux disease)     takes Protonix seldom  . H/O hiatal hernia   . Hemorrhoid   . Anemia     takes Ferrous Fumarate 2 times week  . Blood transfusion     hx of-2yrs ago  . Thyroid nodule     right  . Diabetes mellitus     takes Glipizide and Metformin daily  . Cataract     bilateral and immature     Allergies  Allergen Reactions  . Doxycycline Rash     Current Outpatient Prescriptions  Medication Sig Dispense Refill  . aspirin 81 MG tablet Take 81 mg by mouth at bedtime.       [name] atorvastatin (LIPITOR) 10 MG tablet Take 10 mg by mouth  daily at 6 PM.       . carvedilol (COREG) 25 MG tablet Take 1 tablet (25 mg total) by mouth 2 (two) times daily.  60 tablet  11  . CINNAMON PO Take 2 capsules by mouth every morning.       . ferrous fumarate (HEMOCYTE - 106 MG FE) 325 (106 FE) MG TABS Take 1 tablet by mouth 2 (two) times a week.       . Flaxseed, Linseed, (FLAX SEED OIL PO) Take 1 capsule by mouth every morning.       . furosemide (LASIX) 40 MG tablet Take 40 mg by mouth daily as needed. For fluid retention      . glipiZIDE (GLUCOTROL) 5 MG tablet Take 5 mg by mouth daily before breakfast.       . levothyroxine (SYNTHROID, LEVOTHROID) 50 MCG tablet Take 100 mcg by mouth every morning.       . metFORMIN (GLUCOPHAGE) 1000 MG tablet Take 1,000 mg by mouth 2 (two) times daily.        [name] spironolactone (ALDACTONE) 12.5 mg TABS tablet Take 0.5  tablets (12.5 mg total) by mouth daily.  30 tablet  6  . tetrahydrozoline (EYE DROPS) 0.05 % ophthalmic solution Place 1 drop into both eyes daily as needed. For dry eye relief      . valsartan (DIOVAN) 160 MG tablet Take 80 mg by mouth at bedtime.        No current facility-administered medications for this visit.     Past Surgical History  Procedure Laterality Date  . Cardiac defirillator removed      removed <53months;removed d/t shocking pt 15times;pierced sac of heart 1000cc ;drained  . Stomach stapling surgery  80's  . Appendectomy  1991  . Tummy tuck  1991  . Leg lipoma    . Abdominal hysterectomy  1991  . Tubal ligation  1971  . Ovarian cyst removed  1991  . Cholecystectomy open  1992  . Colonoscopy    . Thyroid lobectomy  07/27/2011    Procedure: THYROID LOBECTOMY;  Surgeon: Gayland Curry, MD,FACS;  Location: [place];  Service: General;  Laterality: Right;  . Cardiac defibrillator placement  >63yrs ago  . Defibrillator removed       Allergies  Allergen Reactions  . Doxycycline Rash      Family History  Problem Relation Age of Onset  . Anesthesia problems Neg Hx   .  Hypotension Neg Hx   . Malignant hyperthermia Neg Hx   . Pseudochol deficiency Neg Hx      Social History Ms. Picardo reports that she has never smoked. She does not have any smokeless tobacco history on file. Ms. Alderfer reports that she drinks alcohol.   Review of Systems CONSTITUTIONAL: No weight loss, fever, chills, weakness or fatigue.  HEENT: Eyes: No visual loss, blurred vision, double vision or yellow sclerae.No hearing loss, sneezing, congestion, runny nose or sore throat.  SKIN: No rash or itching.  CARDIOVASCULAR: per HPI RESPIRATORY: No shortness of breath, cough or sputum.  GASTROINTESTINAL: No anorexia, nausea, vomiting or diarrhea. No abdominal pain or blood.  GENITOURINARY: No burning on urination, no polyuria NEUROLOGICAL: No headache, dizziness, syncope, paralysis, ataxia, numbness or tingling in the extremities. No change in bowel or bladder control.  MUSCULOSKELETAL: No muscle, back pain, joint pain or stiffness.  LYMPHATICS: No enlarged nodes. No history of splenectomy.  PSYCHIATRIC: No history of depression or anxiety.  ENDOCRINOLOGIC: No reports of sweating, cold or heat intolerance. No polyuria or polydipsia.  [name]   Physical Examination p 74 bp 140/78 Wt 291 lbs BMI 50 Gen: resting comfortably, no acute distress HEENT: no scleral icterus, pupils equal round and reactive, no palptable cervical adenopathy,  CV: RRR, no m/r/g, no JVD, no carotid bruits Resp: Clear to auscultation bilaterally GI: abdomen is soft, non-tender, non-distended, normal bowel sounds, no hepatosplenomegaly MSK: extremities are warm, no edema.  Skin: warm, no rash Neuro:  no focal deficits Psych: appropriate affect   Diagnostic Studies  08/07/13 Echo  Study Conclusions  - Procedure narrative: Transthoracic echocardiography. Image quality was suboptimal. The study was technically difficult, as a result of poor sound wave transmission and body habitus. - Left ventricle: The  cavity size was normal. Wall thickness was increased in a pattern of moderate to severe LVH. Systolic function was mildly to moderately reduced. The estimated ejection fraction was in the range of 40% to 45%. Mild to moderate global hypokinesis was seen. There was an increased relative contribution of atrial contraction to ventricular filling. Doppler parameters are consistent with abnormal left ventricular relaxation (grade 1 diastolic  dysfunction). - Aortic valve: Mildly calcified annulus. Trileaflet. - Left atrium: The atrium was mildly dilated. - Right atrium: The atrium was mildly dilated.   07/2013 Event Monitor NSR, frequent PVCs  Assessment and Plan  1. NICM/Chronic systolic heart failure - echo 07/2013 LVEF 40-45%, NYHA II. Appears euvolemic today - continue current medical therapy  2. HTN - continue current meds  3. Dizziness/palpitations - reports symptoms improved after recent medication changes - event monitor with frequent PVCs, no significan arrhythmias. Continue beta blocker.   F/u 6 months   Arnoldo Lenis, M.D., F.A.C.C.

## 2013-10-24 ENCOUNTER — Inpatient Hospital Stay (HOSPITAL_COMMUNITY)
Admission: EM | Admit: 2013-10-24 | Discharge: 2013-10-27 | DRG: 811 | Disposition: A | Discharge status: Home / Self Care | Payer: PRIVATE HEALTH INSURANCE | Attending: Internal Medicine | Admitting: Internal Medicine

## 2013-10-24 ENCOUNTER — Emergency Department (HOSPITAL_COMMUNITY): Payer: PRIVATE HEALTH INSURANCE

## 2013-10-24 ENCOUNTER — Encounter (HOSPITAL_COMMUNITY): Payer: Self-pay | Admitting: Emergency Medicine

## 2013-10-24 DIAGNOSIS — E039 Hypothyroidism, unspecified: Secondary | ICD-10-CM | POA: Diagnosis present

## 2013-10-24 DIAGNOSIS — M129 Arthropathy, unspecified: Secondary | ICD-10-CM | POA: Diagnosis present

## 2013-10-24 DIAGNOSIS — D6489 Other specified anemias: Secondary | ICD-10-CM

## 2013-10-24 DIAGNOSIS — R059 Cough, unspecified: Secondary | ICD-10-CM | POA: Diagnosis not present

## 2013-10-24 DIAGNOSIS — I10 Essential (primary) hypertension: Secondary | ICD-10-CM | POA: Diagnosis present

## 2013-10-24 DIAGNOSIS — Z6841 Body Mass Index (BMI) 40.0 and over, adult: Secondary | ICD-10-CM

## 2013-10-24 DIAGNOSIS — R05 Cough: Secondary | ICD-10-CM | POA: Diagnosis not present

## 2013-10-24 DIAGNOSIS — D509 Iron deficiency anemia, unspecified: Secondary | ICD-10-CM | POA: Diagnosis not present

## 2013-10-24 DIAGNOSIS — Z86718 Personal history of other venous thrombosis and embolism: Secondary | ICD-10-CM

## 2013-10-24 DIAGNOSIS — N289 Disorder of kidney and ureter, unspecified: Secondary | ICD-10-CM

## 2013-10-24 DIAGNOSIS — I5022 Chronic systolic (congestive) heart failure: Secondary | ICD-10-CM

## 2013-10-24 DIAGNOSIS — K219 Gastro-esophageal reflux disease without esophagitis: Secondary | ICD-10-CM | POA: Diagnosis present

## 2013-10-24 DIAGNOSIS — K5900 Constipation, unspecified: Secondary | ICD-10-CM | POA: Diagnosis present

## 2013-10-24 DIAGNOSIS — R079 Chest pain, unspecified: Secondary | ICD-10-CM | POA: Diagnosis present

## 2013-10-24 DIAGNOSIS — N183 Chronic kidney disease, stage 3 unspecified: Secondary | ICD-10-CM | POA: Diagnosis present

## 2013-10-24 DIAGNOSIS — G4730 Sleep apnea, unspecified: Secondary | ICD-10-CM | POA: Diagnosis present

## 2013-10-24 DIAGNOSIS — I509 Heart failure, unspecified: Secondary | ICD-10-CM | POA: Diagnosis present

## 2013-10-24 DIAGNOSIS — E875 Hyperkalemia: Secondary | ICD-10-CM

## 2013-10-24 DIAGNOSIS — J209 Acute bronchitis, unspecified: Secondary | ICD-10-CM | POA: Diagnosis present

## 2013-10-24 DIAGNOSIS — E119 Type 2 diabetes mellitus without complications: Secondary | ICD-10-CM | POA: Diagnosis present

## 2013-10-24 DIAGNOSIS — D649 Anemia, unspecified: Secondary | ICD-10-CM | POA: Diagnosis present

## 2013-10-24 DIAGNOSIS — I129 Hypertensive chronic kidney disease with stage 1 through stage 4 chronic kidney disease, or unspecified chronic kidney disease: Secondary | ICD-10-CM | POA: Diagnosis present

## 2013-10-24 DIAGNOSIS — R0781 Pleurodynia: Secondary | ICD-10-CM | POA: Diagnosis present

## 2013-10-24 DIAGNOSIS — E785 Hyperlipidemia, unspecified: Secondary | ICD-10-CM | POA: Diagnosis present

## 2013-10-24 DIAGNOSIS — I5043 Acute on chronic combined systolic (congestive) and diastolic (congestive) heart failure: Secondary | ICD-10-CM | POA: Diagnosis present

## 2013-10-24 LAB — COMPREHENSIVE METABOLIC PANEL
ALBUMIN: 3 g/dL — AB (ref 3.5–5.2)
ALK PHOS: 88 U/L (ref 39–117)
ALT: 18 U/L (ref 0–35)
AST: 25 U/L (ref 0–37)
Anion gap: 15 (ref 5–15)
BUN: 40 mg/dL — ABNORMAL HIGH (ref 6–23)
CALCIUM: 9.4 mg/dL (ref 8.4–10.5)
CO2: 21 mEq/L (ref 19–32)
Chloride: 100 mEq/L (ref 96–112)
Creatinine, Ser: 1.66 mg/dL — ABNORMAL HIGH (ref 0.50–1.10)
GFR calc non Af Amer: 31 mL/min — ABNORMAL LOW (ref >90)
GFR, EST AFRICAN AMERICAN: 36 mL/min — AB (ref >90)
GLUCOSE: 95 mg/dL (ref 70–99)
POTASSIUM: 5.5 meq/L — AB (ref 3.7–5.3)
Sodium: 136 mEq/L — ABNORMAL LOW (ref 137–147)
Total Bilirubin: 0.2 mg/dL — ABNORMAL LOW (ref 0.3–1.2)
Total Protein: 8.4 g/dL — ABNORMAL HIGH (ref 6.0–8.3)

## 2013-10-24 LAB — URINALYSIS, ROUTINE W REFLEX MICROSCOPIC
Bilirubin Urine: NEGATIVE
GLUCOSE, UA: NEGATIVE mg/dL
Ketones, ur: NEGATIVE mg/dL
LEUKOCYTES UA: NEGATIVE
Nitrite: NEGATIVE
Protein, ur: NEGATIVE mg/dL
SPECIFIC GRAVITY, URINE: 1.01 (ref 1.005–1.030)
Urobilinogen, UA: 0.2 mg/dL (ref 0.0–1.0)
pH: 6 (ref 5.0–8.0)

## 2013-10-24 LAB — CBG MONITORING, ED: Glucose-Capillary: 96 mg/dL (ref 70–99)

## 2013-10-24 LAB — CBC WITH DIFFERENTIAL/PLATELET
BASOS ABS: 0 10*3/uL (ref 0.0–0.1)
BASOS PCT: 0 % (ref 0–1)
EOS ABS: 0 10*3/uL (ref 0.0–0.7)
EOS PCT: 0 % (ref 0–5)
HCT: 25 % — ABNORMAL LOW (ref 36.0–46.0)
Hemoglobin: 7.8 g/dL — ABNORMAL LOW (ref 12.0–15.0)
LYMPHS ABS: 0.8 10*3/uL (ref 0.7–4.0)
Lymphocytes Relative: 17 % (ref 12–46)
MCH: 23.9 pg — AB (ref 26.0–34.0)
MCHC: 31.2 g/dL (ref 30.0–36.0)
MCV: 76.7 fL — AB (ref 78.0–100.0)
Monocytes Absolute: 0.4 10*3/uL (ref 0.1–1.0)
Monocytes Relative: 9 % (ref 3–12)
NEUTROS PCT: 74 % (ref 43–77)
Neutro Abs: 3.4 10*3/uL (ref 1.7–7.7)
Platelets: 134 10*3/uL — ABNORMAL LOW (ref 150–400)
RBC: 3.26 MIL/uL — ABNORMAL LOW (ref 3.87–5.11)
RDW: 18.3 % — AB (ref 11.5–15.5)
WBC: 4.7 10*3/uL (ref 4.0–10.5)

## 2013-10-24 LAB — PRO B NATRIURETIC PEPTIDE: PRO B NATRI PEPTIDE: 522.6 pg/mL — AB (ref 0–125)

## 2013-10-24 LAB — PROTIME-INR
INR: 1.41 (ref 0.00–1.49)
Prothrombin Time: 17.3 seconds — ABNORMAL HIGH (ref 11.6–15.2)

## 2013-10-24 LAB — URINE MICROSCOPIC-ADD ON

## 2013-10-24 LAB — D-DIMER, QUANTITATIVE (NOT AT ARMC): D DIMER QUANT: 1.37 ug{FEU}/mL — AB (ref 0.00–0.48)

## 2013-10-24 LAB — TROPONIN I: Troponin I: <0.3 ng/mL (ref <0.30)

## 2013-10-24 MED ORDER — ASPIRIN EC 81 MG PO TBEC
81.0000 mg | DELAYED_RELEASE_TABLET | Freq: Every day | ORAL | Status: DC
  Administered 2013-10-25 – 2013-10-26 (×2): 81 mg via ORAL
  Filled 2013-10-24 (×4): qty 1

## 2013-10-24 MED ORDER — HEPARIN (PORCINE) IN NACL 100-0.45 UNIT/ML-% IJ SOLN
1200.0000 [IU]/h | INTRAMUSCULAR | Status: DC
  Administered 2013-10-24: 1200 [IU]/h via INTRAVENOUS
  Filled 2013-10-24: qty 250

## 2013-10-24 MED ORDER — SODIUM CHLORIDE 0.9 % IV BOLUS (SEPSIS): 500.0000 mL | Freq: Once | INTRAVENOUS | Status: DC

## 2013-10-24 MED ORDER — FUROSEMIDE 10 MG/ML IJ SOLN
40.0000 mg | Freq: Once | INTRAMUSCULAR | Status: DC
  Filled 2013-10-24: qty 4

## 2013-10-24 MED ORDER — SPIRONOLACTONE 25 MG PO TABS
12.5000 mg | ORAL_TABLET | Freq: Every day | ORAL | Status: DC
  Administered 2013-10-25 – 2013-10-27 (×3): 12.5 mg via ORAL
  Filled 2013-10-24 (×5): qty 1

## 2013-10-24 MED ORDER — SODIUM CHLORIDE 0.9 % IV SOLN
INTRAVENOUS | Status: DC
  Administered 2013-10-25 – 2013-10-26 (×3): via INTRAVENOUS

## 2013-10-24 MED ORDER — SODIUM CHLORIDE 0.9 % IV BOLUS (SEPSIS)
250.0000 mL | Freq: Once | INTRAVENOUS | Status: AC
  Administered 2013-10-24: 250 mL via INTRAVENOUS

## 2013-10-24 MED ORDER — DEXTROSE 5 % IV SOLN
500.0000 mg | INTRAVENOUS | Status: DC
  Administered 2013-10-26: 500 mg via INTRAVENOUS
  Filled 2013-10-24 (×4): qty 500

## 2013-10-24 MED ORDER — DEXTROSE 5 % IV SOLN: 1.0000 g | INTRAVENOUS | Status: DC

## 2013-10-24 MED ORDER — LEVOTHYROXINE SODIUM 100 MCG PO TABS
100.0000 ug | ORAL_TABLET | Freq: Every day | ORAL | Status: DC
  Administered 2013-10-25 – 2013-10-27 (×3): 100 ug via ORAL
  Filled 2013-10-24 (×5): qty 1

## 2013-10-24 MED ORDER — SODIUM CHLORIDE 0.9 % IJ SOLN
3.0000 mL | Freq: Two times a day (BID) | INTRAMUSCULAR | Status: DC
  Administered 2013-10-25 – 2013-10-27 (×2): 3 mL via INTRAVENOUS

## 2013-10-24 MED ORDER — INSULIN ASPART 100 UNIT/ML ~~LOC~~ SOLN
0.0000 [IU] | SUBCUTANEOUS | Status: DC
Start: 2013-10-25 — End: 2013-10-27
  Administered 2013-10-26: 2 [IU] via SUBCUTANEOUS
  Administered 2013-10-27 (×2): 1 [IU] via SUBCUTANEOUS

## 2013-10-24 MED ORDER — FERROUS FUMARATE 325 (106 FE) MG PO TABS
1.0000 | ORAL_TABLET | Freq: Every day | ORAL | Status: DC
  Administered 2013-10-25: 106 mg via ORAL
  Filled 2013-10-24 (×3): qty 1

## 2013-10-24 MED ORDER — ATORVASTATIN CALCIUM 10 MG PO TABS
10.0000 mg | ORAL_TABLET | Freq: Every day | ORAL | Status: DC
  Administered 2013-10-25 – 2013-10-26 (×2): 10 mg via ORAL
  Filled 2013-10-24 (×3): qty 1

## 2013-10-24 MED ORDER — HEPARIN BOLUS VIA INFUSION
3000.0000 [IU] | Freq: Once | INTRAVENOUS | Status: AC
  Administered 2013-10-24: 3000 [IU] via INTRAVENOUS

## 2013-10-24 MED ORDER — DEXTROSE 5 % IV SOLN
500.0000 mg | Freq: Once | INTRAVENOUS | Status: AC
  Administered 2013-10-24: 500 mg via INTRAVENOUS
  Filled 2013-10-24: qty 500

## 2013-10-24 MED ORDER — CEFTRIAXONE SODIUM 1 G IJ SOLR
1.0000 g | Freq: Once | INTRAMUSCULAR | Status: AC
  Administered 2013-10-24: 1 g via INTRAVENOUS
  Filled 2013-10-24: qty 10

## 2013-10-24 NOTE — Progress Notes (Signed)
ANTICOAGULATION CONSULT NOTE - Initial Consult  Pharmacy Consult for Heparin Indication: Rule out pulmonary embolus  Allergies  Allergen Reactions  . Doxycycline Rash    Patient Measurements: Height: 5\' 4"  (162.6 cm) Weight: 291 lb (131.997 kg) IBW/kg (Calculated) : 54.7 Heparin Dosing Weight: 88 kg  Vital Signs: Temp: 99.6 F (37.6 C) (08/05 1912) Temp src: Oral (08/05 1912) BP: 93/52 mmHg (08/05 2130) Pulse Rate: 72 (08/05 2230)  Labs:  Recent Labs  10/24/13 2037  HGB 7.8*  HCT 25.0*  PLT 134*  CREATININE 1.66*  TROPONINI <0.30    Estimated Creatinine Clearance: 44.4 ml/min (by C-G formula based on Cr of 1.66).   Medical History: Past Medical History  Diagnosis Date  . Systolic heart failure     Chronic  . Overweight(278.02)   . Cardiomyopathy secondary   . Hypertension     takes Diovan and Spirololactone daily  . Dysrhythmia     pt takes Digoxin and Carvedilol daily  . CHF (congestive heart failure)     takes Lasix prn  . Sleep apnea     doesn't use CPAP  . Pneumonia     hx of;many yrs ago  . Peripheral neuropathy   . Arthritis     ankles  . Gout     hx of x 1 time in Aug 2012  . GERD (gastroesophageal reflux disease)     takes Protonix seldom  . H/O hiatal hernia   . Hemorrhoid   . Anemia     takes Ferrous Fumarate 2 times week  . Blood transfusion     hx of-22yrs ago  . Thyroid nodule     right  . Diabetes mellitus     takes Glipizide and Metformin daily  . Cataract     bilateral and immature   Medications:   (Not in a hospital admission) Home meds reviewed.  Assessment: Okay for Protocol, patient w/ elevated D.Dimer pain with deep breaths to be treated for r/o PE.  Low H/H and PLTC noted and discussed w/ EDP.  Conservative systemic anticoagulation w/ Heparin to be initiated until VQ scan can be completed in AM to r/o PE.  CT not able to be done acutely due to renal dysfunction.  Baseline PT/INR pending.  Goal of Therapy:   Heparin level 0.3-0.7 units/ml Monitor platelets by anticoagulation protocol: Yes   Plan:  Give 3000 units bolus x 1 Start heparin infusion at 1200 units/hr Check anti-Xa level in 6-8 hours and daily while on heparin Continue to monitor H&H and platelets  Pricilla Larsson 10/24/2013,11:02 PM

## 2013-10-24 NOTE — H&P (Addendum)
Hospitalist Admission History and Physical  Patient name: [name] Medical record number: [id number] Date of birth: 05/28/1945 Age: 68 y.o. Gender: female  Primary Care Provider: Maggie Font, MD  Chief Complaint: pleuritic chest pain, cough, increased WOB   History of Present Illness:This is a 68 y.o. year old female with significant past medical history of HTN, systolic CHF, DM, morbid obesity, remote hx/o DVT  presenting with pleuritic CP, cough, mild increased WOB.  patient states she's had some all upper respiratory/bronchitic symptoms over the past 2-3 days that progressively got worse. Symptoms include cough with small sputum production, chest pain with deep breathing, mild dyspnea. Family reports sick contacts with similar symptoms. Patient is a nonsmoker. No prior history of asthma. Denies any wheezing. Denies any chest pain at rest. Pain seems to be worse with deep breathing and coughing. No remote history of DVT greater than 20 years ago. Patient does report taking and stature to [place] within the last month. Denies any bilateral popliteal pain.   on presentation to the ER, hemodynamically stable. Temp 99.6. Satting greater than 96% on room air. Chest x-ray with mild pulmonary vascular congestion as well as slight bronchitic changes. Pro BNP of 500. D-dimer elevated at 1.37. Troponin within normal limits. Noted creatinine 1.6, potassium 5.5. White blood cell count 4.7, hemoglobin 7.8. Urinalysis negative of infection. Pulmonary Wells score of 3.   Assessment and Plan:  [name] is a 68 y.o. year old female presenting with chest pain, cough, dyspnea   Active Problems:   Pleuritic chest pain   1-Chest pain  -pleuritic in nature  -ddx includes bronchitis,PE, ACS -sxs highly atypical  -cycle CEs -Rocephin and azithro for CAP coverage. Blood cultures -preliminary wells score 3. Heparin gtt pending vq scan   2-HTN/systolic CHF  -LLN BPs -hold  antihypertensives overnight -Cycle CEs -proBNP mildly elevated -mildly volume overloaded clinically. Lasix 40mg  IV x 1  -2D ECHO  -telebed   3-DM -SSI, A1C  FEN/GI: heart healthy/carb modified. Noted hyperkalemia. Mild. Lasix IV and reassess. Hold aldosterone antagonist.  Prophylaxis: heparin gtt  Disposition: pending further evaluation  Code Status:Full Code    Patient Active Problem List   Diagnosis Date Noted  . Pleuritic chest pain 10/24/2013  . Morbid obesity with BMI of 50.0-59.9, adult 07/11/2013  . Anemia 07/11/2013  . S/P partial thyroidectomy -Right 07/28/2011  . GASTROPARESIS 06/02/2010  . PALPITATIONS, OCCASIONAL 04/23/2010  . DIABETES MELLITUS 09/18/2008  . OVERWEIGHT/OBESITY 09/18/2008  . HYPERTENSION, UNSPECIFIED 09/18/2008  . CARDIOMYOPATHY, SECONDARY 09/18/2008  . SYSTOLIC HEART FAILURE, CHRONIC 09/18/2008   Past Medical History: Past Medical History  Diagnosis Date  . Systolic heart failure     Chronic  . Overweight(278.02)   . Cardiomyopathy secondary   . Hypertension     takes Diovan and Spirololactone daily  . Dysrhythmia     pt takes Digoxin and Carvedilol daily  . CHF (congestive heart failure)     takes Lasix prn  . Sleep apnea     doesn't use CPAP  . Pneumonia     hx of;many yrs ago  . Peripheral neuropathy   . Arthritis     ankles  . Gout     hx of x 1 time in Aug 2012  . GERD (gastroesophageal reflux disease)     takes Protonix seldom  . H/O hiatal hernia   . Hemorrhoid   . Anemia     takes Ferrous Fumarate 2 times week  . Blood transfusion  hx of-50yrs ago  . Thyroid nodule     right  . Diabetes mellitus     takes Glipizide and Metformin daily  . Cataract     bilateral and immature    Past Surgical History: Past Surgical History  Procedure Laterality Date  . Cardiac defirillator removed      removed <75months;removed d/t shocking pt 15times;pierced sac of heart 1000cc ;drained  . Stomach stapling surgery  80's  .  Appendectomy  1991  . Tummy tuck  1991  . Leg lipoma    . Abdominal hysterectomy  1991  . Tubal ligation  1971  . Ovarian cyst removed  1991  . Cholecystectomy open  1992  . Colonoscopy    . Thyroid lobectomy  07/27/2011    Procedure: THYROID LOBECTOMY;  Surgeon: Gayland Curry, MD,FACS;  Location: [facility];  Service: General;  Laterality: Right;  . Cardiac defibrillator placement  >86yrs ago  . Defibrillator removed      Social History: History   Social History  . Marital Status: Widowed    Spouse Name: N/A    Number of Children: N/A  . Years of Education: N/A   Social History Main Topics  . Smoking status: Never Smoker   . Smokeless tobacco: None  . Alcohol Use: Yes     Comment: glass wine occasionally  . Drug Use: No  . Sexual Activity: Yes    Birth Control/ Protection: Surgical   Other Topics Concern  . None   Social History Narrative  . None    Family History: Family History  Problem Relation Age of Onset  . Anesthesia problems Neg Hx   . Hypotension Neg Hx   . Malignant hyperthermia Neg Hx   . Pseudochol deficiency Neg Hx     Allergies: Allergies  Allergen Reactions  . Doxycycline Rash    Current Facility-Administered Medications  Medication Dose Route Frequency Provider Last Rate Last Dose  . 0.9 %  sodium chloride infusion   Intravenous Continuous Shanda Howells, MD      . azithromycin (ZITHROMAX) 500 mg in dextrose 5 % 250 mL IVPB  500 mg Intravenous Once Pamella Pert, MD      . azithromycin (ZITHROMAX) 500 mg in dextrose 5 % 250 mL IVPB  500 mg Intravenous Q24H Shanda Howells, MD      . cefTRIAXone (ROCEPHIN) 1 g in dextrose 5 % 50 mL IVPB  1 g Intravenous Once Pamella Pert, MD 100 mL/hr at 10/24/13 2305 1 g at 10/24/13 2305  . cefTRIAXone (ROCEPHIN) 1 g in dextrose 5 % 50 mL IVPB  1 g Intravenous Q24H Shanda Howells, MD      . sodium chloride 0.9 % injection 3 mL  3 mL Intravenous Q12H Shanda Howells, MD       Current Outpatient Prescriptions   Medication Sig Dispense Refill  . aspirin EC 81 MG tablet Take 81 mg by mouth at bedtime.      [name] atorvastatin (LIPITOR) 10 MG tablet Take 10 mg by mouth daily at 6 PM.       . carvedilol (COREG) 25 MG tablet Take 1 tablet (25 mg total) by mouth 2 (two) times daily.  60 tablet  11  . CINNAMON PO Take 2 capsules by mouth every morning.       . ferrous fumarate (HEMOCYTE - 106 MG FE) 325 (106 FE) MG TABS Take 1 tablet by mouth daily.       . Flaxseed, Linseed, (FLAX SEED OIL  PO) Take 1 capsule by mouth every morning.       . furosemide (LASIX) 40 MG tablet Take 40 mg by mouth daily as needed. For fluid retention      . glipiZIDE (GLUCOTROL) 5 MG tablet Take 5 mg by mouth every evening.       [name] levothyroxine (SYNTHROID, LEVOTHROID) 100 MCG tablet Take 100 mcg by mouth daily before breakfast.      . metFORMIN (GLUCOPHAGE) 1000 MG tablet Take 1,000 mg by mouth 2 (two) times daily.        [name] spironolactone (ALDACTONE) 12.5 mg TABS tablet Take 0.5 tablets (12.5 mg total) by mouth daily.  30 tablet  6  . tetrahydrozoline (EYE DROPS) 0.05 % ophthalmic solution Place 1 drop into both eyes daily as needed. For dry eye relief      . valsartan (DIOVAN) 160 MG tablet Take 80 mg by mouth at bedtime.        Review Of Systems: 12 point ROS negative except as noted above in HPI.  Physical Exam: Filed Vitals:   10/24/13 2230  BP:   Pulse: 72  Temp:   Resp: 26    General: alert, cooperative and morbidly obese HEENT: PERRLA and extra ocular movement intact Heart: S1, S2 normal, no murmur, rub or gallop, regular rate and rhythm Lungs: clear to auscultation, no wheezes or rales and unlabored breathing Abdomen: abdomen is soft without significant tenderness, masses, organomegaly or guarding Extremities: extremities normal, atraumatic, no cyanosis or edema, no popliteal pain  Skin:no rashes, no ecchymoses Neurology: normal without focal findings  Labs and Imaging: Lab Results  Component Value Date/Time    NA 136* 10/24/2013  8:37 PM   K 5.5* 10/24/2013  8:37 PM   CL 100 10/24/2013  8:37 PM   CO2 21 10/24/2013  8:37 PM   BUN 40* 10/24/2013  8:37 PM   CREATININE 1.66* 10/24/2013  8:37 PM   CREATININE 1.29* 08/01/2013 12:01 AM   GLUCOSE 95 10/24/2013  8:37 PM   Lab Results  Component Value Date   WBC 4.7 10/24/2013   HGB 7.8* 10/24/2013   HCT 25.0* 10/24/2013   MCV 76.7* 10/24/2013   PLT 134* 10/24/2013   Urinalysis    Component Value Date/Time   COLORURINE YELLOW 10/24/2013 [place] 10/24/2013 2204   LABSPEC 1.010 10/24/2013 2204   PHURINE 6.0 10/24/2013 2204   GLUCOSEU NEGATIVE 10/24/2013 2204   HGBUR TRACE* 10/24/2013 [place] 10/24/2013 2204   KETONESUR NEGATIVE 10/24/2013 2204   PROTEINUR NEGATIVE 10/24/2013 2204   UROBILINOGEN 0.2 10/24/2013 2204   NITRITE NEGATIVE 10/24/2013 2204   LEUKOCYTESUR NEGATIVE 10/24/2013 2204       Dg Chest 2 View  10/24/2013   CLINICAL DATA:  Chest pain and cough.  EXAM: CHEST  2 VIEW  COMPARISON:  07/20/2011  FINDINGS: There is slight pulmonary vascular prominence. There is also peribronchial thickening with accentuation of the interstitial markings. There is minimal scarring at the left base laterally. There are no consolidative infiltrates. There is minimal blunting of the costophrenic angles consistent with tiny effusions. No osseous abnormality.  IMPRESSION: Slight pulmonary vascular congestion. Slight bronchitic changes with tiny effusions.   Electronically Signed   By: Rozetta Nunnery M.D.   On: 10/24/2013 20:28           Shanda Howells MD  Pager: [phone]

## 2013-10-24 NOTE — ED Provider Notes (Signed)
CSN: [id number]     Arrival date & time 10/24/13  1907 History   This chart was scribed for Pamella Pert, MD by Jeanell Sparrow, ED Scribe. This patient was seen in room APA06/APA06 and the patient's care was started at 7:31 PM.   Chief Complaint  Patient presents with  . Fever   Patient is a 68 y.o. female presenting with chest pain. The history is provided by the patient. No language interpreter was used.  Chest Pain Pain location:  L chest Pain radiates to:  Does not radiate Pain severity:  Moderate Onset quality:  Gradual Duration:  4 days Timing:  Intermittent Progression:  Worsening Chronicity:  New Context: breathing   Relieved by:  None tried Worsened by:  Nothing tried Ineffective treatments:  None tried Associated symptoms: cough and fever   Associated symptoms: no abdominal pain, no dizziness, no fatigue, no headache, no nausea, no shortness of breath and not vomiting   Risk factors: no prior DVT/PE, no smoking and no surgery    HPI Comments: [name] is a 68 y.o. female with a hx of HTN and DM who presents to the Emergency Department complaining of moderate intermittent right sided chest pain that started 3 days ago. She reports that pain is only present when she takes a deep breath. She reports that the pain has been worsening. She states that she has being having polyuria, chills, and a low grade subjective fever. She also states that she has being having a cough productive of yellow sputum. She reports that she does not smoke. She denies any recent hx of blood clots, cancer, or surgeries. She also denies any leg swelling, nausea, emesis, or diarrhea.   Past Medical History  Diagnosis Date  . Systolic heart failure     Chronic  . Overweight(278.02)   . Cardiomyopathy secondary   . Hypertension     takes Diovan and Spirololactone daily  . Dysrhythmia     pt takes Digoxin and Carvedilol daily  . CHF (congestive heart failure)     takes Lasix prn  . Sleep  apnea     doesn't use CPAP  . Pneumonia     hx of;many yrs ago  . Peripheral neuropathy   . Arthritis     ankles  . Gout     hx of x 1 time in Aug 2012  . GERD (gastroesophageal reflux disease)     takes Protonix seldom  . H/O hiatal hernia   . Hemorrhoid   . Anemia     takes Ferrous Fumarate 2 times week  . Blood transfusion     hx of-3yrs ago  . Thyroid nodule     right  . Diabetes mellitus     takes Glipizide and Metformin daily  . Cataract     bilateral and immature   Past Surgical History  Procedure Laterality Date  . Cardiac defirillator removed      removed <59months;removed d/t shocking pt 15times;pierced sac of heart 1000cc ;drained  . Stomach stapling surgery  80's  . Appendectomy  1991  . Tummy tuck  1991  . Leg lipoma    . Abdominal hysterectomy  1991  . Tubal ligation  1971  . Ovarian cyst removed  1991  . Cholecystectomy open  1992  . Colonoscopy    . Thyroid lobectomy  07/27/2011    Procedure: THYROID LOBECTOMY;  Surgeon: Gayland Curry, MD,FACS;  Location: [facility];  Service: General;  Laterality: Right;  .  Cardiac defibrillator placement  >36yrs ago  . Defibrillator removed     Family History  Problem Relation Age of Onset  . Anesthesia problems Neg Hx   . Hypotension Neg Hx   . Malignant hyperthermia Neg Hx   . Pseudochol deficiency Neg Hx    History  Substance Use Topics  . Smoking status: Never Smoker   . Smokeless tobacco: Not on file  . Alcohol Use: Yes     Comment: glass wine occasionally   OB History   Grav Para Term Preterm Abortions TAB SAB Ect Mult Living                 Review of Systems  Constitutional: Positive for fever and chills. Negative for fatigue.  HENT: Negative for congestion and drooling.   Eyes: Negative for pain.  Respiratory: Positive for cough. Negative for shortness of breath.   Cardiovascular: Positive for chest pain. Negative for leg swelling.  Gastrointestinal: Negative for nausea, vomiting, abdominal pain and  diarrhea.  Endocrine: Positive for polyuria.  Genitourinary: Negative for dysuria and hematuria.  Musculoskeletal: Negative for neck pain.  Skin: Negative for color change.  Neurological: Negative for dizziness and headaches.  Hematological: Negative for adenopathy.  Psychiatric/Behavioral: Negative for behavioral problems.  All other systems reviewed and are negative.   Allergies  Doxycycline  Home Medications   Prior to Admission medications   Medication Sig Start Date End Date Taking? Authorizing Provider  aspirin EC 81 MG tablet Take 81 mg by mouth at bedtime.   Yes Historical Provider, MD  atorvastatin (LIPITOR) 10 MG tablet Take 10 mg by mouth daily at 6 PM.  01/09/13  Yes Historical Provider, MD  carvedilol (COREG) 25 MG tablet Take 1 tablet (25 mg total) by mouth 2 (two) times daily. 08/06/13 04/17/15 Yes Arnoldo Lenis, MD  CINNAMON PO Take 2 capsules by mouth every morning.    Yes Historical Provider, MD  ferrous fumarate (HEMOCYTE - 106 MG FE) 325 (106 FE) MG TABS Take 1 tablet by mouth daily.    Yes Historical Provider, MD  Flaxseed, Linseed, (FLAX SEED OIL PO) Take 1 capsule by mouth every morning.    Yes Historical Provider, MD  furosemide (LASIX) 40 MG tablet Take 40 mg by mouth daily as needed. For fluid retention   Yes Historical Provider, MD  glipiZIDE (GLUCOTROL) 5 MG tablet Take 5 mg by mouth every evening.  11/23/12  Yes Historical Provider, MD  levothyroxine (SYNTHROID, LEVOTHROID) 100 MCG tablet Take 100 mcg by mouth daily before breakfast.   Yes Historical Provider, MD  metFORMIN (GLUCOPHAGE) 1000 MG tablet Take 1,000 mg by mouth 2 (two) times daily.     Yes Historical Provider, MD  spironolactone (ALDACTONE) 12.5 mg TABS tablet Take 0.5 tablets (12.5 mg total) by mouth daily. 02/19/13  Yes Lendon Colonel, NP  tetrahydrozoline (EYE DROPS) 0.05 % ophthalmic solution Place 1 drop into both eyes daily as needed. For dry eye relief   Yes Historical Provider, MD   valsartan (DIOVAN) 160 MG tablet Take 80 mg by mouth at bedtime.    Yes Historical Provider, MD   BP 142/66  Pulse 76  Temp(Src) 99.6 F (37.6 C) (Oral)  Resp 20  Ht 5\' 4"  (1.626 m)  Wt 291 lb (131.997 kg)  BMI 49.93 kg/m2  SpO2 100% Physical Exam  Nursing note and vitals reviewed. Constitutional: She is oriented to person, place, and time. She appears well-developed and well-nourished. No distress.  Obese.  HENT:  Head: Normocephalic and atraumatic.  Mouth/Throat: Oropharynx is clear and moist. No oropharyngeal exudate.  Eyes: Pupils are equal, round, and reactive to light.  Neck: Neck supple. No tracheal deviation present.  Cardiovascular: Normal rate, regular rhythm and normal heart sounds.  Exam reveals no gallop and no friction rub.   No murmur heard. Pulmonary/Chest: Effort normal. No respiratory distress.  Abdominal: Soft. She exhibits no distension and no mass. There is no tenderness. There is no rebound and no guarding.  Musculoskeletal: Normal range of motion.  Normal appearing and symmetric extremities.   Neurological: She is alert and oriented to person, place, and time.  Skin: Skin is warm and dry.  Psychiatric: She has a normal mood and affect. Her behavior is normal.    ED Course  Procedures (including critical care time) DIAGNOSTIC STUDIES: Oxygen Saturation is 100% on RA, normal by my interpretation.    COORDINATION OF CARE: 7:35 PM- Pt advised of plan for treatment which includes medication, radiology, and labs and pt agrees.  Labs Review Labs Reviewed  CBC WITH DIFFERENTIAL - Abnormal; Notable for the following:    RBC 3.26 (*)    Hemoglobin 7.8 (*)    HCT 25.0 (*)    MCV 76.7 (*)    MCH 23.9 (*)    RDW 18.3 (*)    Platelets 134 (*)    All other components within normal limits  COMPREHENSIVE METABOLIC PANEL - Abnormal; Notable for the following:    Sodium 136 (*)    Potassium 5.5 (*)    BUN 40 (*)    Creatinine, Ser 1.66 (*)    Total  Protein 8.4 (*)    Albumin 3.0 (*)    Total Bilirubin 0.2 (*)    GFR calc non Af Amer 31 (*)    GFR calc Af Amer 36 (*)    All other components within normal limits  URINALYSIS, ROUTINE W REFLEX MICROSCOPIC - Abnormal; Notable for the following:    Hgb urine dipstick TRACE (*)    All other components within normal limits  PRO B NATRIURETIC PEPTIDE - Abnormal; Notable for the following:    Pro B Natriuretic peptide (BNP) 522.6 (*)    All other components within normal limits  D-DIMER, QUANTITATIVE - Abnormal; Notable for the following:    D-Dimer, Quant 1.37 (*)    All other components within normal limits  CULTURE, BLOOD (ROUTINE X 2)  CULTURE, BLOOD (ROUTINE X 2)  TROPONIN I  URINE MICROSCOPIC-ADD ON  CBG MONITORING, ED    Imaging Review Dg Chest 2 View  10/24/2013   CLINICAL DATA:  Chest pain and cough.  EXAM: CHEST  2 VIEW  COMPARISON:  07/20/2011  FINDINGS: There is slight pulmonary vascular prominence. There is also peribronchial thickening with accentuation of the interstitial markings. There is minimal scarring at the left base laterally. There are no consolidative infiltrates. There is minimal blunting of the costophrenic angles consistent with tiny effusions. No osseous abnormality.  IMPRESSION: Slight pulmonary vascular congestion. Slight bronchitic changes with tiny effusions.   Electronically Signed   By: Rozetta Nunnery M.D.   On: 10/24/2013 20:28     EKG Interpretation   Date/Time:  Wednesday October 24 2013 19:44:49 EDT Ventricular Rate:  73 PR Interval:  168 QRS Duration: 96 QT Interval:  375 QTC Calculation: 413 R Axis:   -22 Text Interpretation:  Sinus rhythm Borderline left axis deviation No  significant change since last tracing Confirmed by Jaquail Mclees  MD, Juquan Reznick  (  [id number]) on 10/25/2013 11:53:19 AM       MDM   Final diagnoses:  Hyperkalemia  Renal insufficiency  Pleuritic chest pain  Cough    8:30 PM 68 y.o. female who presents with new onset cough and  pleuritic chest pain. She does have a remote history of DVT but also has a good story for pneumonia. She's had low-grade temperatures at home. She is afebrile and vital signs are unremarkable here. Will get screening labs and imaging. PE on ddx, but also suspicious for pna.   CXR neg. D-dimer elev, but Cr also mildly elev from baseline. Will cover for CAP and admit for V/Q scan. I discussed heparin w/ pharmacist to cover for potential PE. Her anemia and thrombocytopenia appear to be chronic and not new. She denies any blood in her stool. No active bleeding. Will bolus and give her a lower mtx dose of heparin.    I personally performed the services described in this documentation, which was scribed in my presence. The recorded information has been reviewed and is accurate.      Pamella Pert, MD 10/25/13 1155

## 2013-10-24 NOTE — ED Notes (Signed)
Pt c/o cough, fever and states it hurts when she takes a deep breath.

## 2013-10-25 ENCOUNTER — Observation Stay (HOSPITAL_COMMUNITY): Payer: PRIVATE HEALTH INSURANCE

## 2013-10-25 ENCOUNTER — Encounter (HOSPITAL_COMMUNITY): Payer: Self-pay

## 2013-10-25 DIAGNOSIS — N183 Chronic kidney disease, stage 3 unspecified: Secondary | ICD-10-CM | POA: Insufficient documentation

## 2013-10-25 DIAGNOSIS — E875 Hyperkalemia: Secondary | ICD-10-CM | POA: Diagnosis present

## 2013-10-25 DIAGNOSIS — M129 Arthropathy, unspecified: Secondary | ICD-10-CM | POA: Diagnosis present

## 2013-10-25 DIAGNOSIS — J209 Acute bronchitis, unspecified: Secondary | ICD-10-CM | POA: Diagnosis present

## 2013-10-25 DIAGNOSIS — R05 Cough: Secondary | ICD-10-CM | POA: Diagnosis present

## 2013-10-25 DIAGNOSIS — I5043 Acute on chronic combined systolic (congestive) and diastolic (congestive) heart failure: Secondary | ICD-10-CM | POA: Diagnosis present

## 2013-10-25 DIAGNOSIS — R079 Chest pain, unspecified: Secondary | ICD-10-CM | POA: Diagnosis present

## 2013-10-25 DIAGNOSIS — K5900 Constipation, unspecified: Secondary | ICD-10-CM | POA: Diagnosis present

## 2013-10-25 DIAGNOSIS — D509 Iron deficiency anemia, unspecified: Secondary | ICD-10-CM | POA: Diagnosis present

## 2013-10-25 DIAGNOSIS — R059 Cough, unspecified: Secondary | ICD-10-CM | POA: Diagnosis present

## 2013-10-25 DIAGNOSIS — G4730 Sleep apnea, unspecified: Secondary | ICD-10-CM | POA: Diagnosis present

## 2013-10-25 DIAGNOSIS — E785 Hyperlipidemia, unspecified: Secondary | ICD-10-CM | POA: Diagnosis present

## 2013-10-25 DIAGNOSIS — Z86718 Personal history of other venous thrombosis and embolism: Secondary | ICD-10-CM | POA: Diagnosis not present

## 2013-10-25 DIAGNOSIS — I129 Hypertensive chronic kidney disease with stage 1 through stage 4 chronic kidney disease, or unspecified chronic kidney disease: Secondary | ICD-10-CM | POA: Diagnosis present

## 2013-10-25 DIAGNOSIS — I509 Heart failure, unspecified: Secondary | ICD-10-CM | POA: Diagnosis present

## 2013-10-25 DIAGNOSIS — D6489 Other specified anemias: Secondary | ICD-10-CM

## 2013-10-25 DIAGNOSIS — K219 Gastro-esophageal reflux disease without esophagitis: Secondary | ICD-10-CM | POA: Diagnosis present

## 2013-10-25 DIAGNOSIS — Z6841 Body Mass Index (BMI) 40.0 and over, adult: Secondary | ICD-10-CM | POA: Diagnosis not present

## 2013-10-25 DIAGNOSIS — E039 Hypothyroidism, unspecified: Secondary | ICD-10-CM | POA: Diagnosis present

## 2013-10-25 DIAGNOSIS — E119 Type 2 diabetes mellitus without complications: Secondary | ICD-10-CM | POA: Diagnosis present

## 2013-10-25 LAB — HEPARIN LEVEL (UNFRACTIONATED)
HEPARIN UNFRACTIONATED: 0.11 [IU]/mL — AB (ref 0.30–0.70)
Heparin Unfractionated: 0.36 IU/mL (ref 0.30–0.70)

## 2013-10-25 LAB — GLUCOSE, CAPILLARY
GLUCOSE-CAPILLARY: 91 mg/dL (ref 70–99)
GLUCOSE-CAPILLARY: 103 mg/dL — AB (ref 70–99)
GLUCOSE-CAPILLARY: 113 mg/dL — AB (ref 70–99)
Glucose-Capillary: 134 mg/dL — ABNORMAL HIGH (ref 70–99)
Glucose-Capillary: 118 mg/dL — ABNORMAL HIGH (ref 70–99)
Glucose-Capillary: 142 mg/dL — ABNORMAL HIGH (ref 70–99)

## 2013-10-25 LAB — COMPREHENSIVE METABOLIC PANEL
ALT: 15 U/L (ref 0–35)
AST: 19 U/L (ref 0–37)
Albumin: 2.6 g/dL — ABNORMAL LOW (ref 3.5–5.2)
Alkaline Phosphatase: 80 U/L (ref 39–117)
Anion gap: 13 (ref 5–15)
BUN: 36 mg/dL — ABNORMAL HIGH (ref 6–23)
CHLORIDE: 104 meq/L (ref 96–112)
CO2: 21 mEq/L (ref 19–32)
Calcium: 9 mg/dL (ref 8.4–10.5)
Creatinine, Ser: 1.49 mg/dL — ABNORMAL HIGH (ref 0.50–1.10)
GFR calc Af Amer: 41 mL/min — ABNORMAL LOW (ref >90)
GFR, EST NON AFRICAN AMERICAN: 35 mL/min — AB (ref >90)
Glucose, Bld: 100 mg/dL — ABNORMAL HIGH (ref 70–99)
POTASSIUM: 4.8 meq/L (ref 3.7–5.3)
Sodium: 138 mEq/L (ref 137–147)
Total Protein: 7.5 g/dL (ref 6.0–8.3)

## 2013-10-25 LAB — CBC WITH DIFFERENTIAL/PLATELET
Basophils Absolute: 0 10*3/uL (ref 0.0–0.1)
Basophils Relative: 0 % (ref 0–1)
EOS ABS: 0 10*3/uL (ref 0.0–0.7)
EOS PCT: 0 % (ref 0–5)
HCT: 22.1 % — ABNORMAL LOW (ref 36.0–46.0)
Hemoglobin: 6.9 g/dL — CL (ref 12.0–15.0)
LYMPHS ABS: 1.1 10*3/uL (ref 0.7–4.0)
Lymphocytes Relative: 27 % (ref 12–46)
MCH: 24 pg — AB (ref 26.0–34.0)
MCHC: 31.2 g/dL (ref 30.0–36.0)
MCV: 77 fL — AB (ref 78.0–100.0)
Monocytes Absolute: 0.3 10*3/uL (ref 0.1–1.0)
Monocytes Relative: 8 % (ref 3–12)
Neutro Abs: 2.6 10*3/uL (ref 1.7–7.7)
Neutrophils Relative %: 65 % (ref 43–77)
Platelets: 127 10*3/uL — ABNORMAL LOW (ref 150–400)
RBC: 2.87 MIL/uL — AB (ref 3.87–5.11)
RDW: 18.5 % — AB (ref 11.5–15.5)
WBC: 4 10*3/uL (ref 4.0–10.5)

## 2013-10-25 LAB — HEMOGLOBIN AND HEMATOCRIT, BLOOD
HEMATOCRIT: 25.2 % — AB (ref 36.0–46.0)
Hemoglobin: 8.1 g/dL — ABNORMAL LOW (ref 12.0–15.0)

## 2013-10-25 LAB — TSH: TSH: 1.32 u[IU]/mL (ref 0.350–4.500)

## 2013-10-25 LAB — TROPONIN I
Troponin I: <0.3 ng/mL (ref <0.30)
Troponin I: <0.3 ng/mL (ref <0.30)
Troponin I: <0.3 ng/mL (ref <0.30)

## 2013-10-25 LAB — HEMOGLOBIN A1C
HEMOGLOBIN A1C: 6.2 % — AB (ref <5.7)
Mean Plasma Glucose: 131 mg/dL — ABNORMAL HIGH (ref <117)

## 2013-10-25 LAB — ABO/RH: ABO/RH(D): B NEG

## 2013-10-25 LAB — PREPARE RBC (CROSSMATCH)

## 2013-10-25 MED ORDER — HEPARIN (PORCINE) IN NACL 100-0.45 UNIT/ML-% IJ SOLN
1500.0000 [IU]/h | INTRAMUSCULAR | Status: DC
  Administered 2013-10-25: 1500 [IU]/h via INTRAVENOUS
  Filled 2013-10-25 (×2): qty 250

## 2013-10-25 MED ORDER — FUROSEMIDE 10 MG/ML IJ SOLN
20.0000 mg | Freq: Once | INTRAMUSCULAR | Status: AC
  Administered 2013-10-25: 20 mg via INTRAVENOUS

## 2013-10-25 MED ORDER — DEXTROSE 5 % IV SOLN
1.0000 g | INTRAVENOUS | Status: DC
  Administered 2013-10-25: 1 g via INTRAVENOUS
  Filled 2013-10-25 (×4): qty 10

## 2013-10-25 MED ORDER — SODIUM CHLORIDE 0.9 % IV SOLN: Freq: Once | INTRAVENOUS | Status: DC

## 2013-10-25 MED ORDER — TECHNETIUM TO 99M ALBUMIN AGGREGATED
6.0000 | Freq: Once | INTRAVENOUS | Status: AC | PRN
  Administered 2013-10-25: 6.5 via INTRAVENOUS

## 2013-10-25 MED ORDER — TECHNETIUM TC 99M DIETHYLENETRIAME-PENTAACETIC ACID
40.0000 | Freq: Once | INTRAVENOUS | Status: AC | PRN
  Administered 2013-10-25: 40 via RESPIRATORY_TRACT

## 2013-10-25 MED ORDER — HEPARIN BOLUS VIA INFUSION
3000.0000 [IU] | Freq: Once | INTRAVENOUS | Status: AC
  Administered 2013-10-25: 3000 [IU] via INTRAVENOUS
  Filled 2013-10-25: qty 3000

## 2013-10-25 NOTE — Progress Notes (Signed)
PROGRESS NOTE  [name] [id number] DOB: [birth year] DOA: 10/24/2013 PCP: Maggie Font, MD  HPI/Recap of past 9 hours: 68 year old African American female past medical history of microcytic anemia-baseline [id number], systolic and diastolic CHF and hypertension presented to the emergency room with complaints of chest pain and mild shortness of breath. In the emergency room she was found to have a normal troponin, but a mildly elevated BNP of 500. In addition, patient's hemoglobin was found to be 7.8. She was admitted to the hospital service for further evaluation.  Assessment/Plan: Principal Problem:   Anemia in the setting of chronic microcytic anemia: Hemoglobin is morning for further to 6.9. Patient has noted no change in her stool habits. She's not noticed any increased dyspnea exertional the last few months. She has not noted any bright red blood in her stool. We'll plan to transfuse 1 unit packed red blood cells and heme test her stools. If they're heme positive, will consult gastroenterology for further workup. Patient's last colonoscopy was approximately 4-5 years ago by Dr. Carlyon Prows fields Active Problems:   DIABETES MELLITUS: Last A1c checked found to be at 6.1, technically diabetes, but diet managed    Morbid obesity: Patient needs criteria with BMI greater than 50    HYPERTENSION, UNSPECIFIED: Blood pressure stable.    Pleuritic chest pain: Unclear etiology. Troponins have remained normal. They have been more shortness of breath related. Has since resolved.    Acute on chronic combined systolic and diastolic heart failure: Even though BNP only 500, likely much more elevated given shortness of breath complaints plus obesity likely falsely lowering BNP. Have started IV Lasix. If iron overloaded, mild     CKD (chronic kidney disease) stage 3, GFR 30-59 ml/min: Appears to be at baseline.   Code Status: Full code  Family Communication: Daughter at bedside  Disposition  Plan: Patient hoping to go home tomorrow as she is scheduled to go on trip-for with her family. Discharge once we are sure that hemoglobin is stable and no signs of bleeding. Likely fully diuresed by tomorrow   Consultants:  None  Procedures:  None  Antibiotics: None  HPI/Subjective: Patient doing okay. No complaints. No further chest pain or shortness of breath  Objective: BP 118/62  Pulse 74  Temp(Src) 98.9 F (37.2 C) (Oral)  Resp 18  Ht 5\' 4"  (1.626 m)  Wt 132.71 kg (292 lb 9.2 oz)  BMI 50.20 kg/m2  SpO2 100%  Intake/Output Summary (Last 24 hours) at 10/25/13 1324 Last data filed at 10/25/13 1251  Gross per 24 hour  Intake      0 ml  Output    900 ml  Net   -900 ml   Filed Weights   10/24/13 1912 10/25/13 0612  Weight: 131.997 kg (291 lb) 132.71 kg (292 lb 9.2 oz)    Exam:   General:  Alert and oriented x3, no acute distress  Cardiovascular: Regular rate and rhythm, Q000111Q, soft 2/6 systolic ejection murmur  Respiratory: Clear auscultation bilaterally  Abdomen: Soft, obese, nontender, positive bowel sounds  Musculoskeletal: No clubbing or cyanosis, trace edema   Data Reviewed: Basic Metabolic Panel:  Recent Labs Lab 10/24/13 2037 10/25/13 0533  NA 136* 138  K 5.5* 4.8  CL 100 104  CO2 21 21  GLUCOSE 95 100*  BUN 40* 36*  CREATININE 1.66* 1.49*  CALCIUM 9.4 9.0   Liver Function Tests:  Recent Labs Lab 10/24/13 2037 10/25/13 0533  AST 25 19  ALT 18 15  ALKPHOS 88 80  BILITOT 0.2* <0.2*  PROT 8.4* 7.5  ALBUMIN 3.0* 2.6*   No results found for this basename: LIPASE, AMYLASE,  in the last 168 hours No results found for this basename: AMMONIA,  in the last 168 hours CBC:  Recent Labs Lab 10/24/13 2037 10/25/13 0533  WBC 4.7 4.0  NEUTROABS 3.4 2.6  HGB 7.8* 6.9*  HCT 25.0* 22.1*  MCV 76.7* 77.0*  PLT 134* 127*   Cardiac Enzymes:    Recent Labs Lab 10/24/13 2037 10/24/13 2352 10/25/13 0533 10/25/13 1123  TROPONINI  <0.30 <0.30 <0.30 <0.30   BNP (last 3 results)  Recent Labs  10/24/13 2037  PROBNP 522.6*   CBG:  Recent Labs Lab 10/24/13 2044 10/25/13 0057 10/25/13 0412 10/25/13 0727 10/25/13 1205  GLUCAP 96 134* 118* 91 103*    Recent Results (from the past 240 hour(s))  CULTURE, BLOOD (ROUTINE X 2)     Status: None   Collection Time    10/24/13  8:37 PM      Result Value Ref Range Status   Specimen Description BLOOD RIGHT ARM   Final   Special Requests BOTTLES DRAWN AEROBIC AND ANAEROBIC 4CC EACH   Final   Culture NO GROWTH 1 DAY   Final   Report Status PENDING   Incomplete  CULTURE, BLOOD (ROUTINE X 2)     Status: None   Collection Time    10/24/13 11:14 PM      Result Value Ref Range Status   Specimen Description BLOOD RIGHT HAND   Final   Special Requests BOTTLES DRAWN AEROBIC ONLY 4CC   Final   Culture NO GROWTH 1 DAY   Final   Report Status PENDING   Incomplete     Studies: Dg Chest 2 View  10/24/2013   CLINICAL DATA:  Chest pain and cough.  EXAM: CHEST  2 VIEW  COMPARISON:  07/20/2011  FINDINGS: There is slight pulmonary vascular prominence. There is also peribronchial thickening with accentuation of the interstitial markings. There is minimal scarring at the left base laterally. There are no consolidative infiltrates. There is minimal blunting of the costophrenic angles consistent with tiny effusions. No osseous abnormality.  IMPRESSION: Slight pulmonary vascular congestion. Slight bronchitic changes with tiny effusions.   Electronically Signed   By: Rozetta Nunnery M.D.   On: 10/24/2013 20:28    Scheduled Meds: . sodium chloride   Intravenous Once  . aspirin EC  81 mg Oral QHS  . atorvastatin  10 mg Oral q1800  . azithromycin  500 mg Intravenous Q24H  . cefTRIAXone (ROCEPHIN)  IV  1 g Intravenous Q24H  . ferrous fumarate  1 tablet Oral Daily  . furosemide  20 mg Intravenous Once  . furosemide  40 mg Intravenous Once  . insulin aspart  0-9 Units Subcutaneous 6 times per  day  . levothyroxine  100 mcg Oral QAC breakfast  . sodium chloride  3 mL Intravenous Q12H  . spironolactone  12.5 mg Oral Daily    Continuous Infusions: . sodium chloride 75 mL/hr at 10/25/13 0821  . heparin 1,500 Units/hr (10/25/13 0800)     Time spent: 25 minutes  [facility] Pager [phone]. If 7PM-7AM, please contact night-coverage at [email or web address], password [email or web address] 10/25/2013, 1:24 PM  LOS: 1 day

## 2013-10-25 NOTE — Care Management Note (Addendum)
    Page 1 of 1   10/26/2013     12:28:51 PM CARE MANAGEMENT NOTE 10/26/2013  Patient:  [name], [name]   Account Number:  [id number]  Date Initiated:  10/25/2013  Documentation initiated by:  Theophilus Kinds  Subjective/Objective Assessment:   Pt admitted from home with Cp and CHF. Pt lives alone and will return home at discharge. Pt is independent with ADl's.     Action/Plan:   No CM needs noted.   Anticipated DC Date:  10/26/2013   Anticipated DC Plan:  [place]  CM consult      Choice offered to / List presented to:             Status of service:  Completed, signed off Medicare Important Message given?  YES (If response is "NO", the following Medicare IM given date fields will be blank) Date Medicare IM given:  10/26/2013 Medicare IM given by:  Theophilus Kinds Date Additional Medicare IM given:   Additional Medicare IM given by:    Discharge Disposition:  HOME/SELF CARE  Per UR Regulation:    If discussed at Long Length of Stay Meetings, dates discussed:    Comments:  10/26/13 [place], RN BSN CM Pt for d/c on 10/27/13. No CM needs noted.  10/25/13 [place], RN BSN CM

## 2013-10-25 NOTE — Progress Notes (Signed)
ANTICOAGULATION CONSULT NOTE - follow up  Pharmacy Consult for Heparin Indication: Rule out pulmonary embolus  Allergies  Allergen Reactions  . Doxycycline Rash   Patient Measurements: Height: 5\' 4"  (162.6 cm) Weight: 292 lb 9.2 oz (132.71 kg) IBW/kg (Calculated) : 54.7 Heparin Dosing Weight: 88 kg  Vital Signs: Temp: 98.9 F (37.2 C) (08/06 0600) Temp src: Oral (08/06 0600) BP: 118/62 mmHg (08/06 0600) Pulse Rate: 74 (08/06 0600)  Labs:  Recent Labs  10/24/13 2037 10/24/13 2352 10/25/13 0533  HGB 7.8*  --  6.9*  HCT 25.0*  --  22.1*  PLT 134*  --  127*  LABPROT 17.3*  --   --   INR 1.41  --   --   HEPARINUNFRC  --   --  0.11*  CREATININE 1.66*  --  1.49*  TROPONINI <0.30 <0.30 <0.30   Estimated Creatinine Clearance: 49.7 ml/min (by C-G formula based on Cr of 1.49).  Medical History: Past Medical History  Diagnosis Date  . Systolic heart failure     Chronic  . Overweight(278.02)   . Cardiomyopathy secondary   . Hypertension     takes Diovan and Spirololactone daily  . Dysrhythmia     pt takes Digoxin and Carvedilol daily  . CHF (congestive heart failure)     takes Lasix prn  . Sleep apnea     doesn't use CPAP  . Pneumonia     hx of;many yrs ago  . Peripheral neuropathy   . Arthritis     ankles  . Gout     hx of x 1 time in Aug 2012  . GERD (gastroesophageal reflux disease)     takes Protonix seldom  . H/O hiatal hernia   . Hemorrhoid   . Anemia     takes Ferrous Fumarate 2 times week  . Blood transfusion     hx of-4yrs ago  . Thyroid nodule     right  . Diabetes mellitus     takes Glipizide and Metformin daily  . Cataract     bilateral and immature   Medications:  Prescriptions prior to admission  Medication Sig Dispense Refill  . aspirin EC 81 MG tablet Take 81 mg by mouth at bedtime.      [name] atorvastatin (LIPITOR) 10 MG tablet Take 10 mg by mouth daily at 6 PM.       . carvedilol (COREG) 25 MG tablet Take 1 tablet (25 mg total) by  mouth 2 (two) times daily.  60 tablet  11  . CINNAMON PO Take 2 capsules by mouth every morning.       . ferrous fumarate (HEMOCYTE - 106 MG FE) 325 (106 FE) MG TABS Take 1 tablet by mouth daily.       . Flaxseed, Linseed, (FLAX SEED OIL PO) Take 1 capsule by mouth every morning.       . furosemide (LASIX) 40 MG tablet Take 40 mg by mouth daily as needed. For fluid retention      . glipiZIDE (GLUCOTROL) 5 MG tablet Take 5 mg by mouth every evening.       [name] levothyroxine (SYNTHROID, LEVOTHROID) 100 MCG tablet Take 100 mcg by mouth daily before breakfast.      . metFORMIN (GLUCOPHAGE) 1000 MG tablet Take 1,000 mg by mouth 2 (two) times daily.        [name] spironolactone (ALDACTONE) 12.5 mg TABS tablet Take 0.5 tablets (12.5 mg total) by mouth daily.  30 tablet  6  .  tetrahydrozoline (EYE DROPS) 0.05 % ophthalmic solution Place 1 drop into both eyes daily as needed. For dry eye relief      . valsartan (DIOVAN) 160 MG tablet Take 80 mg by mouth at bedtime.        Home meds reviewed.  Assessment: Okay for Protocol, patient w/ elevated D.Dimer pain with deep breaths to be treated for r/o PE.  Low H/H and PLTC noted and discussed w/ EDP.  Conservative systemic anticoagulation w/ Heparin to be initiated until VQ scan can be completed to r/o PE.  CT not able to be done acutely due to renal dysfunction.   Initial heparin level is below goal.  Goal of Therapy:  Heparin level 0.3-0.7 units/ml Monitor platelets by anticoagulation protocol: Yes   Plan:  Re-bolus with Heparin 3000 units IV x 1 Increase Heparin infusion to 1500 units/hr Recheck heparin level in 6 hours Heparin level and CBC daily while on Heparin F/U studies & need for long-term anticoagulation  [place] Crane, Shavonda Wiedman A 10/25/2013,7:47 AM

## 2013-10-25 NOTE — Progress Notes (Signed)
UR completed 

## 2013-10-26 DIAGNOSIS — I5022 Chronic systolic (congestive) heart failure: Secondary | ICD-10-CM

## 2013-10-26 DIAGNOSIS — I10 Essential (primary) hypertension: Secondary | ICD-10-CM

## 2013-10-26 DIAGNOSIS — Z6841 Body Mass Index (BMI) 40.0 and over, adult: Secondary | ICD-10-CM

## 2013-10-26 DIAGNOSIS — R079 Chest pain, unspecified: Secondary | ICD-10-CM

## 2013-10-26 DIAGNOSIS — E119 Type 2 diabetes mellitus without complications: Secondary | ICD-10-CM

## 2013-10-26 LAB — CBC WITH DIFFERENTIAL/PLATELET
Basophils Absolute: 0 10*3/uL (ref 0.0–0.1)
Basophils Relative: 0 % (ref 0–1)
EOS ABS: 0.1 10*3/uL (ref 0.0–0.7)
Eosinophils Relative: 2 % (ref 0–5)
HCT: 25.2 % — ABNORMAL LOW (ref 36.0–46.0)
Hemoglobin: 8 g/dL — ABNORMAL LOW (ref 12.0–15.0)
Lymphocytes Relative: 26 % (ref 12–46)
Lymphs Abs: 0.9 10*3/uL (ref 0.7–4.0)
MCH: 24.6 pg — AB (ref 26.0–34.0)
MCHC: 31.7 g/dL (ref 30.0–36.0)
MCV: 77.5 fL — ABNORMAL LOW (ref 78.0–100.0)
Monocytes Absolute: 0.2 10*3/uL (ref 0.1–1.0)
Monocytes Relative: 6 % (ref 3–12)
NEUTROS PCT: 66 % (ref 43–77)
Neutro Abs: 2.4 10*3/uL (ref 1.7–7.7)
PLATELETS: 131 10*3/uL — AB (ref 150–400)
RBC: 3.25 MIL/uL — ABNORMAL LOW (ref 3.87–5.11)
RDW: 18 % — ABNORMAL HIGH (ref 11.5–15.5)
WBC: 3.6 10*3/uL — AB (ref 4.0–10.5)

## 2013-10-26 LAB — GLUCOSE, CAPILLARY
GLUCOSE-CAPILLARY: 134 mg/dL — AB (ref 70–99)
Glucose-Capillary: 118 mg/dL — ABNORMAL HIGH (ref 70–99)
Glucose-Capillary: 120 mg/dL — ABNORMAL HIGH (ref 70–99)
Glucose-Capillary: 148 mg/dL — ABNORMAL HIGH (ref 70–99)
Glucose-Capillary: 165 mg/dL — ABNORMAL HIGH (ref 70–99)
Glucose-Capillary: 90 mg/dL (ref 70–99)

## 2013-10-26 LAB — COMPREHENSIVE METABOLIC PANEL
ALT: 56 U/L — ABNORMAL HIGH (ref 0–35)
AST: 66 U/L — ABNORMAL HIGH (ref 0–37)
Albumin: 2.6 g/dL — ABNORMAL LOW (ref 3.5–5.2)
Alkaline Phosphatase: 124 U/L — ABNORMAL HIGH (ref 39–117)
Anion gap: 15 (ref 5–15)
BUN: 31 mg/dL — AB (ref 6–23)
CO2: 21 meq/L (ref 19–32)
CREATININE: 1.47 mg/dL — AB (ref 0.50–1.10)
Calcium: 8.9 mg/dL (ref 8.4–10.5)
Chloride: 104 mEq/L (ref 96–112)
GFR, EST AFRICAN AMERICAN: 41 mL/min — AB (ref >90)
GFR, EST NON AFRICAN AMERICAN: 36 mL/min — AB (ref >90)
GLUCOSE: 141 mg/dL — AB (ref 70–99)
Potassium: 4.9 mEq/L (ref 3.7–5.3)
Sodium: 140 mEq/L (ref 137–147)
Total Bilirubin: 0.4 mg/dL (ref 0.3–1.2)
Total Protein: 7.9 g/dL (ref 6.0–8.3)

## 2013-10-26 LAB — HEPARIN LEVEL (UNFRACTIONATED): HEPARIN UNFRACTIONATED: 0.68 [IU]/mL (ref 0.30–0.70)

## 2013-10-26 LAB — PREPARE RBC (CROSSMATCH)

## 2013-10-26 MED ORDER — FUROSEMIDE 10 MG/ML IJ SOLN
30.0000 mg | Freq: Once | INTRAMUSCULAR | Status: AC
  Administered 2013-10-26: 30 mg via INTRAVENOUS
  Filled 2013-10-26: qty 4

## 2013-10-26 MED ORDER — CEFUROXIME AXETIL 250 MG PO TABS
500.0000 mg | ORAL_TABLET | Freq: Two times a day (BID) | ORAL | Status: DC
  Administered 2013-10-27: 500 mg via ORAL
  Filled 2013-10-26: qty 2

## 2013-10-26 MED ORDER — POLYETHYLENE GLYCOL 3350 17 G PO PACK
17.0000 g | PACK | Freq: Every day | ORAL | Status: DC
  Administered 2013-10-26 – 2013-10-27 (×2): 17 g via ORAL
  Filled 2013-10-26 (×2): qty 1

## 2013-10-26 MED ORDER — FERROUS FUMARATE 325 (106 FE) MG PO TABS
1.0000 | ORAL_TABLET | Freq: Two times a day (BID) | ORAL | Status: DC
  Administered 2013-10-26 – 2013-10-27 (×3): 106 mg via ORAL
  Filled 2013-10-26 (×3): qty 1

## 2013-10-26 MED ORDER — SODIUM CHLORIDE 0.9 % IV SOLN: Freq: Once | INTRAVENOUS | Status: DC

## 2013-10-26 NOTE — Progress Notes (Signed)
[facility] PROGRESS NOTE  [name] [id number] DOB: 1946-01-09 DOA: 10/24/2013 PCP: Maggie Font, MD  Assessment/Plan: 1. Acute on chronic anemia: presumed to be secondary to aplastic process in presence of chronic iron deficiency anemia. -no signs of overt bleeding -patient symptomatic -will transfuse another unit of PRBC -current Hgb 8.0 -will benefit of further work up as an outpatient including EGD and repetition of colonoscopy if appropriate -will continue ferrous fumarate  2. Diabetes: Last A1C 6.1 -continue diet controlled as an outpatient for now -while inpatient will continue SSI  3. Morbid obesity: BMI 50 -advise to follow low calorie diet and increase exercise  4. HTN: stable and well controlled. Continue current regimen  5. Chest discomfort: resolved. Was present prior to admission and most likely demand ischecmia from low Hgb. -will monitor -V/Q scan negative  6. Chronic systolic heart failure: currently compensated. Initially mildly elevated. Improved with extra IV lasix. Most likely anemia contributing. SOB driven by bronchitis  7. Hypothyroidism: continue synthroid  8. HLD: continue statins  9. Bronchitis: will continue cefuroxime for 4 more days to complete antibiotic therapy.  10. Constipation: will start miralax  Code Status: Full Family Communication: no family at bedside Disposition Plan: home in am   Consultants:  None   Procedures:  V/Q scan: low probability for PE  Antibiotics:  None   HPI/Subjective: Overall feeling better. Denies CP, SOB, fever, nausea, vomiting and abd pain. Reports no BM. Still weak and fatigue (but less than prior to admission)  Objective: Filed Vitals:   10/26/13 1000  BP: 112/67  Pulse: 115  Temp: 98.3 F (36.8 C)  Resp: 20    Intake/Output Summary (Last 24 hours) at 10/26/13 1120 Last data filed at 10/26/13 1000  Gross per 24 hour  Intake   2005 ml  Output   2800 ml  Net   -795  ml   Filed Weights   10/24/13 1912 10/25/13 0612  Weight: 131.997 kg (291 lb) 132.71 kg (292 lb 9.2 oz)    Exam:   General:  NAD, feeling better. Denies CP and SOB  Cardiovascular: S1 and s2, no rubs or gallops  Respiratory: good air movement, no wheezing, no rales  Abdomen: soft, obese, positive BS, no rebound  Musculoskeletal: no cyanosis or clubbing; positive trace edema bilaterally  Data Reviewed: Basic Metabolic Panel:  Recent Labs Lab 10/24/13 2037 10/25/13 0533 10/26/13 0528  NA 136* 138 140  K 5.5* 4.8 4.9  CL 100 104 104  CO2 21 21 21   GLUCOSE 95 100* 141*  BUN 40* 36* 31*  CREATININE 1.66* 1.49* 1.47*  CALCIUM 9.4 9.0 8.9   Liver Function Tests:  Recent Labs Lab 10/24/13 2037 10/25/13 0533 10/26/13 0528  AST 25 19 66*  ALT 18 15 56*  ALKPHOS 88 80 124*  BILITOT 0.2* <0.2* 0.4  PROT 8.4* 7.5 7.9  ALBUMIN 3.0* 2.6* 2.6*   CBC:  Recent Labs Lab 10/24/13 2037 10/25/13 0533 10/25/13 2143 10/26/13 0528  WBC 4.7 4.0  --  3.6*  NEUTROABS 3.4 2.6  --  2.4  HGB 7.8* 6.9* 8.1* 8.0*  HCT 25.0* 22.1* 25.2* 25.2*  MCV 76.7* 77.0*  --  77.5*  PLT 134* 127*  --  131*   Cardiac Enzymes:  Recent Labs Lab 10/24/13 2037 10/24/13 2352 10/25/13 0533 10/25/13 1123  TROPONINI <0.30 <0.30 <0.30 <0.30   BNP (last 3 results)  Recent Labs  10/24/13 2037  PROBNP 522.6*   CBG:  Recent Labs Lab 10/25/13  1609 10/25/13 2202 10/26/13 0051 10/26/13 0501 10/26/13 0724  GLUCAP 113* 142* 148* 120* 118*    Recent Results (from the past 240 hour(s))  CULTURE, BLOOD (ROUTINE X 2)     Status: None   Collection Time    10/24/13  8:37 PM      Result Value Ref Range Status   Specimen Description BLOOD RIGHT ARM   Final   Special Requests BOTTLES DRAWN AEROBIC AND ANAEROBIC 4CC EACH   Final   Culture NO GROWTH 1 DAY   Final   Report Status PENDING   Incomplete  CULTURE, BLOOD (ROUTINE X 2)     Status: None   Collection Time    10/24/13 11:14 PM       Result Value Ref Range Status   Specimen Description BLOOD RIGHT HAND   Final   Special Requests BOTTLES DRAWN AEROBIC ONLY 4CC   Final   Culture NO GROWTH 1 DAY   Final   Report Status PENDING   Incomplete     Studies: Dg Chest 2 View  10/24/2013   CLINICAL DATA:  Chest pain and cough.  EXAM: CHEST  2 VIEW  COMPARISON:  07/20/2011  FINDINGS: There is slight pulmonary vascular prominence. There is also peribronchial thickening with accentuation of the interstitial markings. There is minimal scarring at the left base laterally. There are no consolidative infiltrates. There is minimal blunting of the costophrenic angles consistent with tiny effusions. No osseous abnormality.  IMPRESSION: Slight pulmonary vascular congestion. Slight bronchitic changes with tiny effusions.   Electronically Signed   By: Rozetta Nunnery M.D.   On: 10/24/2013 20:28   Nm Pulmonary Perf And Vent  10/25/2013   CLINICAL DATA:  Dyspnea.  History of DVT  EXAM: NUCLEAR MEDICINE VENTILATION - PERFUSION LUNG SCAN  TECHNIQUE: Ventilation images were obtained in multiple projections using inhaled aerosol technetium 99 M DTPA. Perfusion images were obtained in multiple projections after intravenous injection of Tc-6m MAA.  RADIOPHARMACEUTICALS:  40 mCi Tc-82m DTPA aerosol and 6 mCi Tc-48m MAA  COMPARISON:  Chest x-ray from 1 day prior  FINDINGS: There is a segmental sized matched ventilation and perfusion defect affecting the superior segment right lower lobe, best seen on posterior and left lateral images. No mismatched perfusion defect.  IMPRESSION: Low probability for pulmonary embolism (10-19%) by PIOPED II.   Electronically Signed   By: Jorje Guild M.D.   On: 10/25/2013 03:41    Scheduled Meds: . sodium chloride   Intravenous Once  . sodium chloride   Intravenous Once  . aspirin EC  81 mg Oral QHS  . atorvastatin  10 mg Oral q1800  . [START ON 10/27/2013] cefUROXime  500 mg Oral BID WC  . ferrous fumarate  1 tablet Oral BID   . furosemide  40 mg Intravenous Once  . insulin aspart  0-9 Units Subcutaneous 6 times per day  . levothyroxine  100 mcg Oral QAC breakfast  . polyethylene glycol  17 g Oral Daily  . sodium chloride  3 mL Intravenous Q12H  . spironolactone  12.5 mg Oral Daily   Continuous Infusions: . sodium chloride 75 mL/hr at 10/26/13 0820    Principal Problem:   Anemia Active Problems:   DIABETES MELLITUS   Morbid obesity   HYPERTENSION, UNSPECIFIED   Morbid obesity with BMI of 50.0-59.9, adult   Pleuritic chest pain   Acute on chronic combined systolic and diastolic heart failure   Chest pain   Microcytic anemia  CKD (chronic kidney disease) stage 3, GFR 30-59 ml/min    Time spent: <30 minutes    Barton Dubois  [facility] Pager [phone]. If 7PM-7AM, please contact night-coverage at [email or web address], password [facility] 10/26/2013, 11:20 AM  LOS: 2 days

## 2013-10-26 NOTE — Clinical Documentation Improvement (Addendum)
Pt with known anemia per  Please clarify if anemia in setting of abnormal  WBC, RBC, H/H, and PLTS can be further specified as one of the diagnoses listed below and document in pn or d/c summary.   Possible Clinical Conditions?  Pancytopenia Acute Blood Loss Anemia Aplastic anemia Precipitous drop in Hematocrit Acute on chronic blood loss anemia  Other Condition  Cannot Clinically Determine   Supporting Information: Signs and Symptoms: Anemia in the setting of chronic microcytic anemia: Hemoglobin is morning for further to 6.9 We'll plan to transfuse 1 unit packed red blood cells and heme test her stools per pn 10/25/13 Diagnostics: Component      WBC RBC Hemoglobin  Latest Ref Rng      4.0 - 10.5 K/uL 3.87 - 5.11 MIL/uL 12.0 - 15.0 g/dL  10/26/2013     5:28 AM 3.6 (L) 3.25 (L) 8.0 (L)   Component      HCT  Latest Ref Rng      36.0 - 46.0 %  10/26/2013     5:28 AM 25.2 (L)   Component      Platelets  Latest Ref Rng      150 - 400 K/uL  10/26/2013     5:28 AM 131 (L)   Treatments:  ferrous fumarate (HEMOCYTE - 106 mg FE) tablet 106 mg of iron   sodium chloride 0.9 % bolus 250 mL     2 U PRBCs  Thank You, Heloise Beecham ,RN Clinical Documentation Specialist:  [place] [facility] Information Management   Acute on chronic Anemia; 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 No signs of bleeding.

## 2013-10-26 NOTE — Progress Notes (Signed)
ANTICOAGULATION CONSULT NOTE - follow up  Pharmacy Consult for Heparin Indication: Rule out pulmonary embolus  Allergies  Allergen Reactions  . Doxycycline Rash   Patient Measurements: Height: 5\' 4"  (162.6 cm) Weight: 292 lb 9.2 oz (132.71 kg) IBW/kg (Calculated) : 54.7 Heparin Dosing Weight: 88 kg  Vital Signs: Temp: 98 F (36.7 C) (08/07 0504) Temp src: Oral (08/07 0504) BP: 103/53 mmHg (08/07 0504) Pulse Rate: 66 (08/07 0504)  Labs:  Recent Labs  10/24/13 2037 10/24/13 2352 10/25/13 0533 10/25/13 1123 10/25/13 1515 10/25/13 2143 10/26/13 0528  HGB 7.8*  --  6.9*  --   --  8.1* 8.0*  HCT 25.0*  --  22.1*  --   --  25.2* 25.2*  PLT 134*  --  127*  --   --   --  131*  LABPROT 17.3*  --   --   --   --   --   --   INR 1.41  --   --   --   --   --   --   HEPARINUNFRC  --   --  0.11*  --  0.36  --  0.68  CREATININE 1.66*  --  1.49*  --   --   --  1.47*  TROPONINI <0.30 <0.30 <0.30 <0.30  --   --   --    Estimated Creatinine Clearance: 50.4 ml/min (by C-G formula based on Cr of 1.47).  Medical History: Past Medical History  Diagnosis Date  . Systolic heart failure     Chronic  . Overweight(278.02)   . Cardiomyopathy secondary   . Hypertension     takes Diovan and Spirololactone daily  . Dysrhythmia     pt takes Digoxin and Carvedilol daily  . CHF (congestive heart failure)     takes Lasix prn  . Sleep apnea     doesn't use CPAP  . Pneumonia     hx of;many yrs ago  . Peripheral neuropathy   . Arthritis     ankles  . Gout     hx of x 1 time in Aug 2012  . GERD (gastroesophageal reflux disease)     takes Protonix seldom  . H/O hiatal hernia   . Hemorrhoid   . Anemia     takes Ferrous Fumarate 2 times week  . Blood transfusion     hx of-3yrs ago  . Thyroid nodule     right  . Diabetes mellitus     takes Glipizide and Metformin daily  . Cataract     bilateral and immature   Medications:  Prescriptions prior to admission  Medication Sig Dispense  Refill  . aspirin EC 81 MG tablet Take 81 mg by mouth at bedtime.      [name] atorvastatin (LIPITOR) 10 MG tablet Take 10 mg by mouth daily at 6 PM.       . carvedilol (COREG) 25 MG tablet Take 1 tablet (25 mg total) by mouth 2 (two) times daily.  60 tablet  11  . CINNAMON PO Take 2 capsules by mouth every morning.       . ferrous fumarate (HEMOCYTE - 106 MG FE) 325 (106 FE) MG TABS Take 1 tablet by mouth daily.       . Flaxseed, Linseed, (FLAX SEED OIL PO) Take 1 capsule by mouth every morning.       . furosemide (LASIX) 40 MG tablet Take 40 mg by mouth daily as needed. For fluid retention      .  glipiZIDE (GLUCOTROL) 5 MG tablet Take 5 mg by mouth every evening.       [name] levothyroxine (SYNTHROID, LEVOTHROID) 100 MCG tablet Take 100 mcg by mouth daily before breakfast.      . metFORMIN (GLUCOPHAGE) 1000 MG tablet Take 1,000 mg by mouth 2 (two) times daily.        [name] spironolactone (ALDACTONE) 12.5 mg TABS tablet Take 0.5 tablets (12.5 mg total) by mouth daily.  30 tablet  6  . tetrahydrozoline (EYE DROPS) 0.05 % ophthalmic solution Place 1 drop into both eyes daily as needed. For dry eye relief      . valsartan (DIOVAN) 160 MG tablet Take 80 mg by mouth at bedtime.        Assessment: Okay for Protocol, patient w/ elevated D.Dimer on admission and c/o pain with deep breaths to be treated for r/o PE.  Low H/H and PLTC noted and discussed w/ EDP initially.  Conservative systemic anticoagulation w/ Heparin to be initiated until VQ scan can be completed to r/o PE.  VQ scan impression > low probability for PE.   Heparin level is therapeutic x 2.  Goal of Therapy:  Heparin level 0.3-0.7 units/ml Monitor platelets by anticoagulation protocol: Yes   Plan:  Continue Heparin infusion at 1500 units/hr Heparin level and CBC daily while on Heparin  Jessice Madill A 10/26/2013,7:38 AM

## 2013-10-27 DIAGNOSIS — R071 Chest pain on breathing: Secondary | ICD-10-CM

## 2013-10-27 LAB — TYPE AND SCREEN
ABO/RH(D): B NEG
Antibody Screen: NEGATIVE
UNIT DIVISION: 0
UNIT DIVISION: 0

## 2013-10-27 LAB — CBC
HCT: 29.6 % — ABNORMAL LOW (ref 36.0–46.0)
HEMOGLOBIN: 9.6 g/dL — AB (ref 12.0–15.0)
MCH: 25.7 pg — ABNORMAL LOW (ref 26.0–34.0)
MCHC: 32.4 g/dL (ref 30.0–36.0)
MCV: 79.4 fL (ref 78.0–100.0)
Platelets: 137 10*3/uL — ABNORMAL LOW (ref 150–400)
RBC: 3.73 MIL/uL — AB (ref 3.87–5.11)
RDW: 18.3 % — ABNORMAL HIGH (ref 11.5–15.5)
WBC: 3.9 10*3/uL — AB (ref 4.0–10.5)

## 2013-10-27 LAB — BASIC METABOLIC PANEL
Anion gap: 16 — ABNORMAL HIGH (ref 5–15)
BUN: 36 mg/dL — ABNORMAL HIGH (ref 6–23)
CHLORIDE: 102 meq/L (ref 96–112)
CO2: 22 meq/L (ref 19–32)
Calcium: 9.2 mg/dL (ref 8.4–10.5)
Creatinine, Ser: 1.68 mg/dL — ABNORMAL HIGH (ref 0.50–1.10)
GFR calc Af Amer: 35 mL/min — ABNORMAL LOW (ref >90)
GFR calc non Af Amer: 30 mL/min — ABNORMAL LOW (ref >90)
GLUCOSE: 93 mg/dL (ref 70–99)
POTASSIUM: 4.9 meq/L (ref 3.7–5.3)
Sodium: 140 mEq/L (ref 137–147)

## 2013-10-27 LAB — GLUCOSE, CAPILLARY
GLUCOSE-CAPILLARY: 97 mg/dL (ref 70–99)
GLUCOSE-CAPILLARY: 125 mg/dL — AB (ref 70–99)
Glucose-Capillary: 144 mg/dL — ABNORMAL HIGH (ref 70–99)
Glucose-Capillary: 99 mg/dL (ref 70–99)

## 2013-10-27 MED ORDER — CEFUROXIME AXETIL 500 MG PO TABS
500.0000 mg | ORAL_TABLET | Freq: Two times a day (BID) | ORAL | Status: DC
Start: 2013-10-27 — End: 2013-12-16

## 2013-10-27 MED ORDER — POLYETHYLENE GLYCOL 3350 17 G PO PACK: 17.0000 g | PACK | Freq: Every day | ORAL | Status: DC

## 2013-10-27 MED ORDER — VALSARTAN 40 MG PO TABS: 40.0000 mg | ORAL_TABLET | Freq: Every day | ORAL | Status: DC

## 2013-10-27 MED ORDER — FERROUS FUMARATE 325 (106 FE) MG PO TABS: 1.0000 | ORAL_TABLET | Freq: Two times a day (BID) | ORAL | Status: DC

## 2013-10-27 MED ORDER — FUROSEMIDE 40 MG PO TABS: 40.0000 mg | ORAL_TABLET | Freq: Every day | ORAL | Status: DC

## 2013-10-27 NOTE — Progress Notes (Signed)
Patient received discharge instructions and prescriptions and had no further questions.  Patient was able teach back about prescriptions, weight gain, and diet.  Patient's IV was removed and was clean, dry, and intact upon removal.  Patient was escorted to vehicle via wheel chair by nurse tech.

## 2013-10-27 NOTE — Discharge Summary (Signed)
Physician Discharge Summary  [name] [id number] DOB: 1946-02-27 DOA: 10/24/2013  PCP: Maggie Font, MD  Admit date: 10/24/2013 Discharge date: 10/27/2013  Time spent: >30 minutes  Recommendations for Outpatient Follow-up:  1. CBC to follow hemoglobin trend and platelets count 2. Basic metabolic panel and follow electrolytes and renal function  3. Progress his blood pressure and adjust antihypertensive regimen as needed 4. Patient will benefit of followup with cardiology service for further evaluation and treatment of her CHF 5. Patient will benefit of followup with gastroenterology service for outpatient EGD/colonoscopy as an outpatient (as part of further workup for her anemia)  Discharge Diagnoses:  Principal Problem:   Anemia Active Problems:   DIABETES MELLITUS   Morbid obesity   HYPERTENSION, UNSPECIFIED   Morbid obesity with BMI of 50.0-59.9, adult   Pleuritic chest pain   Acute on chronic combined systolic and diastolic heart failure   Chest pain   Microcytic anemia   CKD (chronic kidney disease) stage 3, GFR 30-59 ml/min   Discharge Condition: Stable and improved. Patient will be discharged home.  Diet recommendation: Low sodium diet and low carbohydrate diet.  Filed Weights   10/24/13 1912 10/25/13 0612  Weight: 131.997 kg (291 lb) 132.71 kg (292 lb 9.2 oz)    History of present illness:  68 y.o. year old female with significant past medical history of HTN, systolic CHF, DM, morbid obesity, remote hx/o DVT presenting with pleuritic CP, cough, mild increased WOB. patient states she's had some all upper respiratory/bronchitic symptoms over the past 2-3 days that progressively got worse. Symptoms include cough with small sputum production, chest pain with deep breathing, mild dyspnea. Family reports sick contacts with similar symptoms. Patient is a nonsmoker. No prior history of asthma. Denies any wheezing. Denies any chest pain at rest. Pain seems to be worse  with deep breathing and coughing. No remote history of DVT greater than 20 years ago.    Hospital Course:  1. Acute on chronic anemia: presumed to be secondary to aplastic process in presence of chronic iron deficiency anemia.  -no signs of overt bleeding  -patient was symptomatic; now improved/resolved after transfusion -receive 2 units of PRBC's during hospitalization -Hgb at discharge 9.6 -will continue ferrous fumarate BID -will benefit of further work up as an outpatient including EGD and repetition of colonoscopy if appropriate   2. Diabetes: Last A1C 6.1  -continue low carb diet -continue use of glipizide and metformin  3. Morbid obesity: BMI 50  -advise to follow low calorie diet and increase exercise   4. HTN: stable and well well controlled. Slightly soft measurements while inpatient. -Diovan dose adjusted -patient advised to follow low sodium diet   5. Chest discomfort: resolved. Was present prior to admission and most likely demand ischecmia from low Hgb.  -will monitor  -V/Q scan negative   6. Chronic systolic heart failure: currently compensated. Initially mildly elevated. Improved with extra IV lasix. Most likely anemia contributing. SOB driven by bronchitis  -Patient advised to follow a low-sodium diet and to check her weight on daily basis -Will recommend the use of Lasix 40 mg daily -continue Diovan (dose adjusted due to soft BP), carvedilol and spironolactone. -Patient to follow with cardiology as an outpatient over the next 2 weeks, for further evaluation and treatment of her CHF.  7. Hypothyroidism: continue synthroid   8. HLD: continue statins   9. Bronchitis, acute: will continue cefuroxime for 3 more days to complete antibiotic therapy.  -reports improving in her  breathing.  10. Constipation: will start miralax  11. Chronic kidney disease stage III: Creatinine baseline 1.4-1.6; has remained stable during hospitalization. This has recommended to follow  low-sodium diet   Procedures: V/Q scan: low probability for PE  Consultations:  None   Discharge Exam: Filed Vitals:   10/27/13 0500  BP: 93/52  Pulse: 81  Temp:   Resp:    General: NAD, feeling better. Denies CP and SOB  Cardiovascular: S1 and s2, no rubs or gallops, difficult to assess for JVD given body habitus  Respiratory: good air movement, no wheezing, no rales  Abdomen: soft, obese, positive BS, no rebound  Musculoskeletal: no cyanosis or clubbing; positive trace edema bilaterally  Discharge Instructions You were cared for by a hospitalist during your hospital stay. If you have any questions about your discharge medications or the care you received while you were in the hospital after you are discharged, you can call the unit and asked to speak with the hospitalist on call if the hospitalist that took care of you is not available. Once you are discharged, your primary care physician will handle any further medical issues. Please note that NO REFILLS for any discharge medications will be authorized once you are discharged, as it is imperative that you return to your primary care physician (or establish a relationship with a primary care physician if you do not have one) for your aftercare needs so that they can reassess your need for medications and monitor your lab values.  Discharge Instructions   Diet - low sodium heart healthy    Complete by:  As directed      Discharge instructions    Complete by:  As directed   Take medications as prescribed Follow with PCP in 10 days Arrange follow up with Gastroenterology (Dr. Oneida Alar) in 2 weeks Arrange follow up visit with cardiology service (in 2 weeks) Follow a low sodium diet (less than 2 gram daily) Check your weight on daily basis (contact cardiology office with weight gain of 3 pounds overnight and/or 5 pounds in 1 week)            Medication List         aspirin EC 81 MG tablet  Take 81 mg by mouth at bedtime.      atorvastatin 10 MG tablet  Commonly known as:  LIPITOR  Take 10 mg by mouth daily at 6 PM.     carvedilol 25 MG tablet  Commonly known as:  COREG  Take 1 tablet (25 mg total) by mouth 2 (two) times daily.     cefUROXime 500 MG tablet  Commonly known as:  CEFTIN  Take 1 tablet (500 mg total) by mouth 2 (two) times daily with a meal.     CINNAMON PO  Take 2 capsules by mouth every morning.     EYE DROPS 0.05 % ophthalmic solution  Generic drug:  tetrahydrozoline  Place 1 drop into both eyes daily as needed. For dry eye relief     ferrous fumarate 325 (106 FE) MG Tabs tablet  Commonly known as:  HEMOCYTE - 106 mg FE  Take 1 tablet (106 mg of iron total) by mouth 2 (two) times daily.     FLAX SEED OIL PO  Take 1 capsule by mouth every morning.     furosemide 40 MG tablet  Commonly known as:  LASIX  Take 1 tablet (40 mg total) by mouth daily. For fluid retention     glipiZIDE 5  MG tablet  Commonly known as:  GLUCOTROL  Take 5 mg by mouth every evening.     levothyroxine 100 MCG tablet  Commonly known as:  SYNTHROID, LEVOTHROID  Take 100 mcg by mouth daily before breakfast.     metFORMIN 1000 MG tablet  Commonly known as:  GLUCOPHAGE  Take 1,000 mg by mouth 2 (two) times daily.     polyethylene glycol packet  Commonly known as:  MIRALAX / GLYCOLAX  Take 17 g by mouth daily.     spironolactone 12.5 mg Tabs tablet  Commonly known as:  ALDACTONE  Take 0.5 tablets (12.5 mg total) by mouth daily.     valsartan 40 MG tablet  Commonly known as:  DIOVAN  Take 1 tablet (40 mg total) by mouth at bedtime.       Allergies  Allergen Reactions  . Doxycycline Rash       Follow-up Information   Follow up with HILL,GERALD K, MD In 10 days.   Specialty:  Family Medicine   Contact information:   [address] [place] [address] [phone]        The results of significant diagnostics from this hospitalization (including imaging, microbiology, ancillary  and laboratory) are listed below for reference.    Significant Diagnostic Studies: Dg Chest 2 View  10/24/2013   CLINICAL DATA:  Chest pain and cough.  EXAM: CHEST  2 VIEW  COMPARISON:  07/20/2011  FINDINGS: There is slight pulmonary vascular prominence. There is also peribronchial thickening with accentuation of the interstitial markings. There is minimal scarring at the left base laterally. There are no consolidative infiltrates. There is minimal blunting of the costophrenic angles consistent with tiny effusions. No osseous abnormality.  IMPRESSION: Slight pulmonary vascular congestion. Slight bronchitic changes with tiny effusions.   Electronically Signed   By: Rozetta Nunnery M.D.   On: 10/24/2013 20:28   Nm Pulmonary Perf And Vent  10/25/2013   CLINICAL DATA:  Dyspnea.  History of DVT  EXAM: NUCLEAR MEDICINE VENTILATION - PERFUSION LUNG SCAN  TECHNIQUE: Ventilation images were obtained in multiple projections using inhaled aerosol technetium 99 M DTPA. Perfusion images were obtained in multiple projections after intravenous injection of Tc-48m MAA.  RADIOPHARMACEUTICALS:  40 mCi Tc-20m DTPA aerosol and 6 mCi Tc-68m MAA  COMPARISON:  Chest x-ray from 1 day prior  FINDINGS: There is a segmental sized matched ventilation and perfusion defect affecting the superior segment right lower lobe, best seen on posterior and left lateral images. No mismatched perfusion defect.  IMPRESSION: Low probability for pulmonary embolism (10-19%) by PIOPED II.   Electronically Signed   By: Jorje Guild M.D.   On: 10/25/2013 03:41    Microbiology: Recent Results (from the past 240 hour(s))  CULTURE, BLOOD (ROUTINE X 2)     Status: None   Collection Time    10/24/13  8:37 PM      Result Value Ref Range Status   Specimen Description BLOOD RIGHT ARM DRAWN BY RN   Final   Special Requests BOTTLES DRAWN AEROBIC AND ANAEROBIC 4CC EACH   Final   Culture NO GROWTH 3 DAYS   Final   Report Status PENDING   Incomplete   CULTURE, BLOOD (ROUTINE X 2)     Status: None   Collection Time    10/24/13 11:14 PM      Result Value Ref Range Status   Specimen Description BLOOD RIGHT HAND   Final   Special Requests BOTTLES DRAWN  AEROBIC ONLY 4CC   Final   Culture NO GROWTH 3 DAYS   Final   Report Status PENDING   Incomplete     Labs: Basic Metabolic Panel:  Recent Labs Lab 10/24/13 2037 10/25/13 0533 10/26/13 0528 10/27/13 0550  NA 136* 138 140 140  K 5.5* 4.8 4.9 4.9  CL 100 104 104 102  CO2 21 21 21 22   GLUCOSE 95 100* 141* 93  BUN 40* 36* 31* 36*  CREATININE 1.66* 1.49* 1.47* 1.68*  CALCIUM 9.4 9.0 8.9 9.2   Liver Function Tests:  Recent Labs Lab 10/24/13 2037 10/25/13 0533 10/26/13 0528  AST 25 19 66*  ALT 18 15 56*  ALKPHOS 88 80 124*  BILITOT 0.2* <0.2* 0.4  PROT 8.4* 7.5 7.9  ALBUMIN 3.0* 2.6* 2.6*   CBC:  Recent Labs Lab 10/24/13 2037 10/25/13 0533 10/25/13 2143 10/26/13 0528 10/27/13 0550  WBC 4.7 4.0  --  3.6* 3.9*  NEUTROABS 3.4 2.6  --  2.4  --   HGB 7.8* 6.9* 8.1* 8.0* 9.6*  HCT 25.0* 22.1* 25.2* 25.2* 29.6*  MCV 76.7* 77.0*  --  77.5* 79.4  PLT 134* 127*  --  131* 137*   Cardiac Enzymes:  Recent Labs Lab 10/24/13 2037 10/24/13 2352 10/25/13 0533 10/25/13 1123  TROPONINI <0.30 <0.30 <0.30 <0.30   BNP: BNP (last 3 results)  Recent Labs  10/24/13 2037  PROBNP 522.6*   CBG:  Recent Labs Lab 10/26/13 1606 10/26/13 2010 10/27/13 0047 10/27/13 0449 10/27/13 0737  GLUCAP 90 134* 144* 97 99    Signed:  Barton Dubois  [facility] 10/27/2013, 9:55 AM

## 2013-10-29 LAB — CULTURE, BLOOD (ROUTINE X 2)
Culture: NO GROWTH
Culture: NO GROWTH

## 2013-10-30 ENCOUNTER — Telehealth: Payer: Self-pay | Admitting: Cardiology

## 2013-10-30 NOTE — Telephone Encounter (Signed)
Patient was informed Dr. Harl Bowie was working in the hospital this afternoon but we would get back to her tomorrow.

## 2013-10-31 NOTE — Telephone Encounter (Signed)
Pt will fu for anemia with Dr.Fields and will fu with [place] as scheduled. Dr.fields explained that her anemia has caused her BP to be lower than normal.She will call back if anything changes

## 2013-11-05 ENCOUNTER — Other Ambulatory Visit (HOSPITAL_COMMUNITY): Payer: Self-pay | Admitting: Family Medicine

## 2013-11-05 DIAGNOSIS — R928 Other abnormal and inconclusive findings on diagnostic imaging of breast: Secondary | ICD-10-CM

## 2013-11-09 ENCOUNTER — Encounter: Payer: PRIVATE HEALTH INSURANCE | Admitting: Cardiology

## 2013-11-13 ENCOUNTER — Telehealth: Payer: Self-pay | Admitting: Cardiology

## 2013-11-13 NOTE — Telephone Encounter (Signed)
Spoke with pt,message relayed from [place]

## 2013-11-13 NOTE — Telephone Encounter (Signed)
Pt was told by Dr.Madera upon hospital discharge to take 2-40 mg lasix per day.She says she say Dr.Hill yesterday and he questioned it and asked her to check with [place].Rx written to pt and discharge summary indicates to take only 40 mg per day which pt is doing.Her weight is stable since discharge taking only 40 mg daily.She wanted your opinion    Will forward to Dr.Branch

## 2013-11-13 NOTE — Telephone Encounter (Signed)
Patient has questions regarding her medications post ED visit / tgs

## 2013-11-13 NOTE — Telephone Encounter (Signed)
I would continue taking lasix 40mg  daily   Zandra Abts MD

## 2013-12-06 ENCOUNTER — Encounter: Payer: Self-pay | Admitting: Gastroenterology

## 2013-12-06 ENCOUNTER — Other Ambulatory Visit: Payer: Self-pay

## 2013-12-06 ENCOUNTER — Ambulatory Visit (INDEPENDENT_AMBULATORY_CARE_PROVIDER_SITE_OTHER): Payer: PRIVATE HEALTH INSURANCE | Admitting: Gastroenterology

## 2013-12-06 VITALS — BP 113/69 | HR 71 | Temp 97.7°F | Ht 64.0 in | Wt 298.8 lb

## 2013-12-06 DIAGNOSIS — R7989 Other specified abnormal findings of blood chemistry: Secondary | ICD-10-CM

## 2013-12-06 DIAGNOSIS — D509 Iron deficiency anemia, unspecified: Secondary | ICD-10-CM

## 2013-12-06 MED ORDER — PEG-KCL-NACL-NASULF-NA ASC-C 100 G PO SOLR: 1.0000 | ORAL | Status: DC

## 2013-12-06 NOTE — Addendum Note (Signed)
Addended by: Idamae Schuller on: 12/06/2013 09:39 AM   Modules accepted: Orders

## 2013-12-06 NOTE — Progress Notes (Signed)
PATIENT NIC'D FOR OV

## 2013-12-06 NOTE — Assessment & Plan Note (Signed)
NO S/SX OF ACTIVE GI BLEED. MOST LIKELY DUE TO GASTRITIS IN SETTING OF CRI.  TCS/EGD WITHIN 2 WEEKS NEPHROLOGY REFERRAL FOR ANEMIA/CRI CHECK FERRITIN TODAY

## 2013-12-06 NOTE — Progress Notes (Signed)
Subjective:    Patient ID: [name], female    DOB: 02-Sep-1945, 68 y.o.   MRN: [id number]  Victoria Font, MD  HPI Last seen for TCS 2001- tics. Has had iron pills off and on for a ling time. 2009: iron stores were nl. CONSTIPATION ESPECIALLY ON IRON WHICH IS CHANGE IN HER BOWEL HABITS. OCCASIONAL PROBLEM SWALLOWING SOLID FOOD. ENERGY LEVEL GETTING BETTER. NO WEIGHT LOSS. LAST SAW DR. HILL IN  AUG 2015. NEVER HAD A EGD.  PT DENIES FEVER, CHILLS, HEMATOCHEZIA, HEMATEMESIS, epistaxis, hematuria, nausea, vomiting, melena, diarrhea, CHEST PAIN, SHORTNESS OF BREATH, abdominal pain, problems with sedation, OR heartburn or indigestion.  Past Medical History  Diagnosis Date  . Systolic heart failure     Chronic  . Overweight(278.02)   . Cardiomyopathy secondary   . Hypertension     takes Diovan and Spirololactone daily  . Dysrhythmia     pt takes Digoxin and Carvedilol daily  . CHF (congestive heart failure)     takes Lasix prn  . Sleep apnea     doesn't use CPAP  . Pneumonia     hx of;many yrs ago  . Peripheral neuropathy   . Arthritis     ankles  . Gout     hx of x 1 time in Aug 2012  . GERD (gastroesophageal reflux disease)     takes Protonix seldom  . H/O hiatal hernia   . Hemorrhoid   . Anemia     takes Ferrous Fumarate 2 times week  . Blood transfusion     hx of-1yrs ago  . Thyroid nodule     right  . Diabetes mellitus     takes Glipizide and Metformin daily  . Cataract     bilateral and immature   Past Surgical History  Procedure Laterality Date  . Cardiac defirillator removed      removed <63months;removed d/t shocking pt 15times;pierced sac of heart 1000cc ;drained  . Stomach stapling surgery  80's  . Appendectomy  1991  . Tummy tuck  1991  . Leg lipoma    . Abdominal hysterectomy  1991  . Tubal ligation  1971  . Ovarian cyst removed  1991  . Cholecystectomy open  1992  . Colonoscopy    . Thyroid lobectomy  07/27/2011    Procedure: THYROID  LOBECTOMY;  Surgeon: Gayland Curry, MD,FACS;  Location: [facility];  Service: General;  Laterality: Right;  . Cardiac defibrillator placement  >34yrs ago  . Defibrillator removed      Allergies  Allergen Reactions  . Doxycycline Rash    Current Outpatient Prescriptions  Medication Sig Dispense Refill  . aspirin EC 81 MG tablet Take 81 mg by mouth at bedtime.      [name] atorvastatin (LIPITOR) 10 MG tablet Take 10 mg by mouth daily at 6 PM.     . carvedilol (COREG) 25 MG tablet Take 1 tablet (25 mg total) by mouth 2 (two) times daily.    [name] CINNAMON PO Take 2 capsules by mouth every morning.     . ferrous fumarate (HEMOCYTE - 106 MG FE) 325 (106 FE) MG TABS tablet Take 1 tablet (106 mg of iron total) by mouth 2 (two) times daily.    . Flaxseed, Linseed, (FLAX SEED OIL PO) Take 1 capsule by mouth every morning.     . furosemide (LASIX) 40 MG tablet Take 1 tablet (40 mg total) by mouth daily. For fluid retention    . glipiZIDE (GLUCOTROL)  5 MG tablet Take 5 mg by mouth every evening.     [name] levothyroxine (SYNTHROID, LEVOTHROID) 100 MCG tablet Take 100 mcg by mouth daily before breakfast.    . polyethylene glycol (MIRALAX / GLYCOLAX) packet Take 17 g by mouth daily. AS NEEDED   . spironolactone (ALDACTONE) 12.5 mg TABS tablet Take 0.5 tablets (12.5 mg total) by mouth daily.    [name] tetrahydrozoline (EYE DROPS) 0.05 % ophthalmic solution Place 1 drop into both eyes daily as needed. For dry eye relief    . TRADJENTA 5 MG TABS tablet Take 5 mg by mouth daily.     . valsartan (DIOVAN) 40 MG tablet Take 1 tablet (40 mg total) by mouth at bedtime.    .      .            Review of Systems PER HPI OTHERWISE ALL SYSTEMS ARE NEGATIVE.     Objective:   Physical Exam  Vitals reviewed. Constitutional: She is oriented to person, place, and time. She appears well-developed and well-nourished. No distress.  HENT:  Head: Normocephalic and atraumatic.  Mouth/Throat: Oropharynx is clear and moist. No  oropharyngeal exudate.  Eyes: Pupils are equal, round, and reactive to light. No scleral icterus.  Neck: Normal range of motion. Neck supple.  Cardiovascular: Normal rate, regular rhythm and normal heart sounds.   Pulmonary/Chest: Effort normal and breath sounds normal. No respiratory distress.  Abdominal: Soft. Bowel sounds are normal. She exhibits no distension. There is no tenderness.  Musculoskeletal: She exhibits edema.  Lymphadenopathy:    She has no cervical adenopathy.  Neurological: She is alert and oriented to person, place, and time.  NO FOCAL DEFICITS   Psychiatric: She has a normal mood and affect.          Assessment & Plan:

## 2013-12-06 NOTE — Patient Instructions (Signed)
COMPLETE LAB.  ENDOSCOPY IN 2-3 WEEKS.  SEE NEPHROLOGY IF YOUR CREATININE IS > 1.2.  FOLLOW UP IN 6 MOS. MERRY CHRISTMAS AND HAPPY NEW YEAR!

## 2013-12-11 ENCOUNTER — Ambulatory Visit (HOSPITAL_COMMUNITY)
Admission: RE | Admit: 2013-12-11 | Discharge: 2013-12-11 | Disposition: A | Discharge status: Home / Self Care | Payer: PRIVATE HEALTH INSURANCE | Source: Ambulatory Visit | Attending: Family Medicine | Admitting: Family Medicine

## 2013-12-11 DIAGNOSIS — R928 Other abnormal and inconclusive findings on diagnostic imaging of breast: Secondary | ICD-10-CM | POA: Insufficient documentation

## 2013-12-11 LAB — FERRITIN: FERRITIN: 152 ng/mL (ref 10–291)

## 2013-12-11 NOTE — Progress Notes (Signed)
cc'ed to pcp °

## 2013-12-16 ENCOUNTER — Encounter (HOSPITAL_COMMUNITY): Payer: Self-pay | Admitting: Emergency Medicine

## 2013-12-16 ENCOUNTER — Emergency Department (HOSPITAL_COMMUNITY)
Admission: EM | Admit: 2013-12-16 | Discharge: 2013-12-16 | Disposition: A | Discharge status: Home / Self Care | Payer: PRIVATE HEALTH INSURANCE | Attending: Emergency Medicine | Admitting: Emergency Medicine

## 2013-12-16 ENCOUNTER — Emergency Department (HOSPITAL_COMMUNITY): Payer: PRIVATE HEALTH INSURANCE

## 2013-12-16 DIAGNOSIS — Z79899 Other long term (current) drug therapy: Secondary | ICD-10-CM | POA: Insufficient documentation

## 2013-12-16 DIAGNOSIS — I10 Essential (primary) hypertension: Secondary | ICD-10-CM | POA: Insufficient documentation

## 2013-12-16 DIAGNOSIS — S46909A Unspecified injury of unspecified muscle, fascia and tendon at shoulder and upper arm level, unspecified arm, initial encounter: Secondary | ICD-10-CM | POA: Diagnosis not present

## 2013-12-16 DIAGNOSIS — W19XXXA Unspecified fall, initial encounter: Secondary | ICD-10-CM

## 2013-12-16 DIAGNOSIS — Z8701 Personal history of pneumonia (recurrent): Secondary | ICD-10-CM | POA: Diagnosis not present

## 2013-12-16 DIAGNOSIS — W010XXA Fall on same level from slipping, tripping and stumbling without subsequent striking against object, initial encounter: Secondary | ICD-10-CM | POA: Diagnosis not present

## 2013-12-16 DIAGNOSIS — E663 Overweight: Secondary | ICD-10-CM | POA: Insufficient documentation

## 2013-12-16 DIAGNOSIS — Y9229 Other specified public building as the place of occurrence of the external cause: Secondary | ICD-10-CM | POA: Insufficient documentation

## 2013-12-16 DIAGNOSIS — D649 Anemia, unspecified: Secondary | ICD-10-CM | POA: Insufficient documentation

## 2013-12-16 DIAGNOSIS — Y9389 Activity, other specified: Secondary | ICD-10-CM | POA: Diagnosis not present

## 2013-12-16 DIAGNOSIS — E119 Type 2 diabetes mellitus without complications: Secondary | ICD-10-CM | POA: Insufficient documentation

## 2013-12-16 DIAGNOSIS — M129 Arthropathy, unspecified: Secondary | ICD-10-CM | POA: Diagnosis not present

## 2013-12-16 DIAGNOSIS — I5020 Unspecified systolic (congestive) heart failure: Secondary | ICD-10-CM | POA: Insufficient documentation

## 2013-12-16 DIAGNOSIS — W108XXA Fall (on) (from) other stairs and steps, initial encounter: Secondary | ICD-10-CM | POA: Insufficient documentation

## 2013-12-16 DIAGNOSIS — Z7982 Long term (current) use of aspirin: Secondary | ICD-10-CM | POA: Insufficient documentation

## 2013-12-16 DIAGNOSIS — M79605 Pain in left leg: Secondary | ICD-10-CM

## 2013-12-16 DIAGNOSIS — S4980XA Other specified injuries of shoulder and upper arm, unspecified arm, initial encounter: Secondary | ICD-10-CM | POA: Diagnosis not present

## 2013-12-16 MED ORDER — HYDROMORPHONE HCL 1 MG/ML IJ SOLN
1.0000 mg | Freq: Once | INTRAMUSCULAR | Status: AC
  Administered 2013-12-16: 1 mg via INTRAMUSCULAR
  Filled 2013-12-16: qty 1

## 2013-12-16 MED ORDER — HYDROCODONE-ACETAMINOPHEN 5-325 MG PO TABS: 1.0000 | ORAL_TABLET | ORAL | Status: DC | PRN

## 2013-12-16 NOTE — ED Notes (Signed)
Fell on two steps and left leg bent under her. Pain to both knees and left hip

## 2013-12-16 NOTE — ED Provider Notes (Signed)
CSN: [id number]     Arrival date & time 12/16/13  1756 History   This chart was scribed for Nat Christen, MD, by Neta Ehlers, ED Scribe. This patient was seen in room APA12/APA12 and the patient's care was started at Ali Chukson MD Initiated Contact with Patient 12/16/13 1818     Chief Complaint  Patient presents with  . Fall    The history is provided by the patient. No language interpreter was used.   HPI Comments: [name] is a 68 y.o. female who presents to the Emergency Department complaining of a fall which occurred an hour ago when she slipped on three steps at church and landed with her left leg bent under her body. She denies head impact or LOC. No neck injury She complains of left left thigh, left knee, left tibia, left ankle pain. She reports the pain radiates from her left knee to her ankle. 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   Past Medical History  Diagnosis Date  . Systolic heart failure     Chronic  . Overweight(278.02)   . Cardiomyopathy secondary   . Hypertension     takes Diovan and Spirololactone daily  . Dysrhythmia     pt takes Digoxin and Carvedilol daily  . CHF (congestive heart failure)     takes Lasix prn  . Sleep apnea     doesn't use CPAP  . Pneumonia     hx of;many yrs ago  . Peripheral neuropathy   . Arthritis     ankles  . Gout     hx of x 1 time in Aug 2012  . GERD (gastroesophageal reflux disease)     takes Protonix seldom  . H/O hiatal hernia   . Hemorrhoid   . Anemia     takes Ferrous Fumarate 2 times week  . Blood transfusion     hx of-62yrs ago  . Thyroid nodule     right  . Diabetes mellitus     takes Glipizide and Metformin daily  . Cataract     bilateral and immature   Past Surgical History  Procedure Laterality Date  . Cardiac defirillator removed      removed <48months;removed d/t shocking pt 15times;pierced sac of heart 1000cc ;drained  . Stomach stapling surgery  80's  . Appendectomy  1991  . Tummy  tuck  1991  . Leg lipoma    . Abdominal hysterectomy  1991  . Tubal ligation  1971  . Ovarian cyst removed  1991  . Cholecystectomy open  1992  . Colonoscopy    . Thyroid lobectomy  07/27/2011    Procedure: THYROID LOBECTOMY;  Surgeon: Gayland Curry, MD,FACS;  Location: [place];  Service: General;  Laterality: Right;  . Cardiac defibrillator placement  >64yrs ago  . Defibrillator removed     Family History  Problem Relation Age of Onset  . Anesthesia problems Neg Hx   . Hypotension Neg Hx   . Malignant hyperthermia Neg Hx   . Pseudochol deficiency Neg Hx    History  Substance Use Topics  . Smoking status: Never Smoker   . Smokeless tobacco: Not on file  . Alcohol Use: Yes     Comment: glass wine occasionally   No OB history provided.  Review of Systems  A complete 10 system review of systems was obtained, and all systems were negative except where indicated in the HPI and PE.    Allergies  Doxycycline  Home Medications   Prior to Admission medications   Medication Sig Start Date End Date Taking? Authorizing Provider  aspirin EC 81 MG tablet Take 81 mg by mouth at bedtime.   Yes Historical Provider, MD  atorvastatin (LIPITOR) 10 MG tablet Take 10 mg by mouth daily at 6 PM.  01/09/13  Yes Historical Provider, MD  carvedilol (COREG) 25 MG tablet Take 1 tablet (25 mg total) by mouth 2 (two) times daily. 08/06/13 04/17/15 Yes Arnoldo Lenis, MD  CINNAMON PO Take 2 capsules by mouth every morning.    Yes Historical Provider, MD  ferrous fumarate (HEMOCYTE - 106 MG FE) 325 (106 FE) MG TABS tablet Take 1 tablet (106 mg of iron total) by mouth 2 (two) times daily. 10/27/13  Yes Barton Dubois, MD  Flaxseed, Linseed, (FLAX SEED OIL PO) Take 1 capsule by mouth every morning.    Yes Historical Provider, MD  furosemide (LASIX) 40 MG tablet Take 1 tablet (40 mg total) by mouth daily. For fluid retention 10/27/13  Yes Barton Dubois, MD  glipiZIDE (GLUCOTROL) 5 MG tablet Take 5 mg by mouth  every evening.  11/23/12  Yes Historical Provider, MD  levothyroxine (SYNTHROID, LEVOTHROID) 100 MCG tablet Take 100 mcg by mouth daily before breakfast.   Yes Historical Provider, MD  spironolactone (ALDACTONE) 25 MG tablet Take 12.5 mg by mouth daily.   Yes Historical Provider, MD  tetrahydrozoline (EYE DROPS) 0.05 % ophthalmic solution Place 1 drop into both eyes daily as needed. For dry eye relief   Yes Historical Provider, MD  TRADJENTA 5 MG TABS tablet Take 5 mg by mouth daily.  11/21/13  Yes Historical Provider, MD  valsartan (DIOVAN) 40 MG tablet Take 20 mg by mouth at bedtime. 10/27/13  Yes Barton Dubois, MD  HYDROcodone-acetaminophen (NORCO) 5-325 MG per tablet Take 1-2 tablets by mouth every 4 (four) hours as needed. 12/16/13   Nat Christen, MD   Triage Vitals: BP 113/64  Pulse 72  Temp(Src) 97.8 F (36.6 C) (Oral)  Resp 16  Ht 5\' 4"  (1.626 m)  Wt 298 lb (135.172 kg)  BMI 51.13 kg/m2  SpO2 100%  Physical Exam  Nursing note and vitals reviewed. Constitutional: She is oriented to person, place, and time. She appears well-developed and well-nourished.  Obese.   HENT:  Head: Normocephalic and atraumatic.  Eyes: Conjunctivae and EOM are normal. Pupils are equal, round, and reactive to light.  Neck: Normal range of motion. Neck supple.  Cardiovascular: Normal rate, regular rhythm and normal heart sounds.   Pulmonary/Chest: Effort normal and breath sounds normal.  Abdominal: Soft. Bowel sounds are normal.  Musculoskeletal: Normal range of motion.  Tenderness to left distal femur and entire left tib-fib and  ankle.     Neurological: She is alert and oriented to person, place, and time.  Skin: Skin is warm and dry.  Psychiatric: She has a normal mood and affect. Her behavior is normal.    ED Course  Procedures (including critical care time)  DIAGNOSTIC STUDIES: Oxygen Saturation is 100% on room air, normal by my interpretation.    COORDINATION OF CARE:  6:46 PM- Discussed  treatment plan with patient, and the patient agreed to the plan. The plan includes imaging and pain medication IM.   Labs Review Labs Reviewed - No data to display  Imaging Review Dg Femur Left  12/16/2013   CLINICAL DATA:  Left leg pain after fall.  EXAM: LEFT FEMUR - 2 VIEW  COMPARISON:  None.  FINDINGS: There is no evidence of fracture or other focal bone lesions. Soft tissues are unremarkable.  IMPRESSION: No definite fracture seen in the left femur.   Electronically Signed   By: Sabino Dick M.D.   On: 12/16/2013 19:56   Dg Tibia/fibula Left  12/16/2013   CLINICAL DATA:  Status post fall, left leg pain  EXAM: LEFT TIBIA AND FIBULA - 2 VIEW  COMPARISON:  None.  FINDINGS: No fracture or dislocation is seen.  Moderate degenerative changes of the knee.  The visualized soft tissues are unremarkable.  IMPRESSION: No fracture or dislocation is seen.   Electronically Signed   By: Julian Hy M.D.   On: 12/16/2013 19:56   Dg Ankle Complete Left  12/16/2013   CLINICAL DATA:  Pain in the entire left leg from hip to toes. Patient slipped and fell at church. Morbidly obese. No prior fractures.  EXAM: LEFT ANKLE COMPLETE - 3+ VIEW  COMPARISON:  None.  FINDINGS: There are well corticated bone fragments adjacent to the medial and lateral malleolar, consistent with old injuries. No evidence for acute fracture. There is pes planus and beaking of the anterior talus. Diffuse soft tissue swelling versus habitus. No radiopaque foreign body or soft tissue gas. There is significant atherosclerotic calcification of the small arteries.  IMPRESSION: 1.  No evidence for acute  abnormality. 2. Changes consistent with old injury in the medial and lateral malleolar I. 3. Pes planus.   Electronically Signed   By: Shon Hale M.D.   On: 12/16/2013 19:58     EKG Interpretation None      MDM   Final diagnoses:  Fall, initial encounter  Pain of left lower extremity    Plain films of left femur, left tib-fib, left  ankle are negative for fracture.  Ankle brace, pain medication, discussed with patient and her family   I personally performed the services described in this documentation, which was scribed in my presence. The recorded information has been reviewed and is accurate.     Nat Christen, MD 12/16/13 2159

## 2013-12-16 NOTE — Discharge Instructions (Signed)
X-rays show no fracture. Ankle brace. Ace wrap to knee. Ice. Elevate. Pain medication. Followup your primary care Dr.

## 2013-12-18 ENCOUNTER — Encounter (HOSPITAL_COMMUNITY): Payer: Self-pay | Admitting: Pharmacy Technician

## 2013-12-20 ENCOUNTER — Telehealth: Payer: Self-pay | Admitting: Gastroenterology

## 2013-12-20 NOTE — Telephone Encounter (Signed)
[name] is aware that she will not coming.

## 2013-12-20 NOTE — Telephone Encounter (Signed)
Pt called to let us know that she had fallen down some steps at church and her leg, knee and foot are very swollen and she will need to reschedule her procedure on 10/6 with SF.  She doesn't think she will be able to walk to the bathroom while doing the prep. Please call her back at [phone]

## 2013-12-20 NOTE — Telephone Encounter (Signed)
Pt will call us when she is able to reschedule her TCS.

## 2013-12-24 NOTE — Telephone Encounter (Signed)
REVIEWED. AGREE. NO ADDITIONAL RECOMMENDATIONS. 

## 2013-12-25 ENCOUNTER — Ambulatory Visit (HOSPITAL_COMMUNITY)
Admission: RE | Admit: 2013-12-25 | Payer: PRIVATE HEALTH INSURANCE | Source: Ambulatory Visit | Admitting: Gastroenterology

## 2013-12-25 ENCOUNTER — Encounter (HOSPITAL_COMMUNITY): Admission: RE | Payer: PRIVATE HEALTH INSURANCE | Source: Ambulatory Visit

## 2013-12-25 SURGERY — COLONOSCOPY
Anesthesia: Moderate Sedation

## 2013-12-31 ENCOUNTER — Other Ambulatory Visit (HOSPITAL_COMMUNITY): Payer: Self-pay | Admitting: Family Medicine

## 2013-12-31 DIAGNOSIS — N183 Chronic kidney disease, stage 3 unspecified: Secondary | ICD-10-CM

## 2014-01-02 ENCOUNTER — Other Ambulatory Visit (HOSPITAL_COMMUNITY): Payer: Self-pay | Admitting: Family Medicine

## 2014-01-02 ENCOUNTER — Ambulatory Visit (HOSPITAL_COMMUNITY)
Admission: RE | Admit: 2014-01-02 | Discharge: 2014-01-02 | Disposition: A | Discharge status: Home / Self Care | Payer: PRIVATE HEALTH INSURANCE | Source: Ambulatory Visit | Attending: Family Medicine | Admitting: Family Medicine

## 2014-01-02 DIAGNOSIS — M79671 Pain in right foot: Secondary | ICD-10-CM | POA: Diagnosis not present

## 2014-01-02 DIAGNOSIS — M1990 Unspecified osteoarthritis, unspecified site: Secondary | ICD-10-CM | POA: Insufficient documentation

## 2014-01-02 DIAGNOSIS — R52 Pain, unspecified: Secondary | ICD-10-CM

## 2014-01-02 DIAGNOSIS — M109 Gout, unspecified: Secondary | ICD-10-CM | POA: Diagnosis not present

## 2014-01-02 DIAGNOSIS — N183 Chronic kidney disease, stage 3 unspecified: Secondary | ICD-10-CM

## 2014-01-09 ENCOUNTER — Ambulatory Visit (HOSPITAL_COMMUNITY)
Admission: RE | Admit: 2014-01-09 | Discharge: 2014-01-09 | Disposition: A | Discharge status: Home / Self Care | Payer: PRIVATE HEALTH INSURANCE | Source: Ambulatory Visit | Attending: Family Medicine | Admitting: Family Medicine

## 2014-01-09 DIAGNOSIS — M25572 Pain in left ankle and joints of left foot: Secondary | ICD-10-CM

## 2014-01-09 DIAGNOSIS — M25651 Stiffness of right hip, not elsewhere classified: Secondary | ICD-10-CM

## 2014-01-09 DIAGNOSIS — M25571 Pain in right ankle and joints of right foot: Secondary | ICD-10-CM | POA: Insufficient documentation

## 2014-01-09 DIAGNOSIS — M25559 Pain in unspecified hip: Secondary | ICD-10-CM | POA: Insufficient documentation

## 2014-01-09 DIAGNOSIS — M25673 Stiffness of unspecified ankle, not elsewhere classified: Secondary | ICD-10-CM | POA: Insufficient documentation

## 2014-01-09 DIAGNOSIS — I10 Essential (primary) hypertension: Secondary | ICD-10-CM | POA: Insufficient documentation

## 2014-01-09 DIAGNOSIS — M25562 Pain in left knee: Secondary | ICD-10-CM | POA: Insufficient documentation

## 2014-01-09 DIAGNOSIS — Z5189 Encounter for other specified aftercare: Secondary | ICD-10-CM | POA: Insufficient documentation

## 2014-01-09 DIAGNOSIS — M25652 Stiffness of left hip, not elsewhere classified: Secondary | ICD-10-CM

## 2014-01-09 DIAGNOSIS — M25561 Pain in right knee: Secondary | ICD-10-CM | POA: Diagnosis not present

## 2014-01-09 DIAGNOSIS — E119 Type 2 diabetes mellitus without complications: Secondary | ICD-10-CM | POA: Diagnosis not present

## 2014-01-09 DIAGNOSIS — M6281 Muscle weakness (generalized): Secondary | ICD-10-CM

## 2014-01-09 DIAGNOSIS — IMO0002 Reserved for concepts with insufficient information to code with codable children: Secondary | ICD-10-CM | POA: Insufficient documentation

## 2014-01-09 DIAGNOSIS — M25672 Stiffness of left ankle, not elsewhere classified: Secondary | ICD-10-CM

## 2014-01-09 DIAGNOSIS — M25579 Pain in unspecified ankle and joints of unspecified foot: Secondary | ICD-10-CM | POA: Insufficient documentation

## 2014-01-09 DIAGNOSIS — E663 Overweight: Secondary | ICD-10-CM | POA: Insufficient documentation

## 2014-01-09 DIAGNOSIS — M25552 Pain in left hip: Secondary | ICD-10-CM

## 2014-01-09 NOTE — Evaluation (Signed)
Physical Therapy Evaluation  Patient Details  Name: [name] MRN: [id number] Date of Birth: September 05, 1945  Today's Date: 01/09/2014 Time: [id number] PT Time Calculation (min): 44 min     1 Eval, TE [id number]         Visit#: 1 of 16  Re-eval: 02/08/14 Assessment Diagnosis: Bilateral knee and ankle pain and stiffness following a fall.  Next MD Visit: Criss Rosales and Iona Beard 02/08/14  Prior Therapy: no  Authorization: [employer] Medicare/Medicaid    Authorization Visit#: 1 of 16   Past Medical History:  Past Medical History  Diagnosis Date  . Systolic heart failure     Chronic  . Overweight(278.02)   . Cardiomyopathy secondary   . Hypertension     takes Diovan and Spirololactone daily  . Dysrhythmia     pt takes Digoxin and Carvedilol daily  . CHF (congestive heart failure)     takes Lasix prn  . Sleep apnea     doesn't use CPAP  . Pneumonia     hx of;many yrs ago  . Peripheral neuropathy   . Arthritis     ankles  . Gout     hx of x 1 time in Aug 2012  . GERD (gastroesophageal reflux disease)     takes Protonix seldom  . H/O hiatal hernia   . Hemorrhoid   . Anemia     takes Ferrous Fumarate 2 times week  . Blood transfusion     hx of-26yrs ago  . Thyroid nodule     right  . Diabetes mellitus     takes Glipizide and Metformin daily  . Cataract     bilateral and immature   Past Surgical History:  Past Surgical History  Procedure Laterality Date  . Cardiac defirillator removed      removed <51months;removed d/t shocking pt 15times;pierced sac of heart 1000cc ;drained  . Stomach stapling surgery  80's  . Appendectomy  1991  . Tummy tuck  1991  . Leg lipoma    . Abdominal hysterectomy  1991  . Tubal ligation  1971  . Ovarian cyst removed  1991  . Cholecystectomy open  1992  . Colonoscopy    . Thyroid lobectomy  07/27/2011    Procedure: THYROID LOBECTOMY;  Surgeon: Gayland Curry, MD,FACS;  Location: [place];  Service: General;  Laterality: Right;  . Cardiac  defibrillator placement  >32yrs ago  . Defibrillator removed     Subjective Symptoms/Limitations Pertinent History: Patient's first fall, patient fell while ambulating down stairs resulting in bilateral knee pain on 12/16/2013. Antwerior knee pain bilateral and pain in Lt posterior knee, pain on dorsum of bilateral feet. No stairs at home, No prior use of cain until fall, no cain today.  high  blood pressure (medicated), diabetes,  No history of stroke, heart attack or cancer. Numbness and tingling in feet, though patient is aware of soft touch.  How long can you stand comfortably?: <5 minutes How long can you walk comfortably?: <82minutes, pain throughout,  Patient Stated Goals: To return to her normal everyday ADL's and IADL's, water aerobics Pain Assessment Currently in Pain?: Yes Pain Score: 5  Pain Location: Knee Pain Orientation: Left Pain Type: Acute pain Pain Onset: 1 to 4 weeks ago Pain Frequency: Constant Pain Relieving Factors: rest Effect of Pain on Daily Activities: unable to perofrm normal IADL's and ADL's withtou modwerate to severe pain.   Sensation/Coordination/Flexibility/Functional Tests Flexibility Thomas: Positive Obers: Positive 90/90: Positive (35 degrees hip flexion) Functional Tests  Functional Tests: Gait: weigth shifting difficulties secondary to pain, bilateral trendelenberg gait, excessive toe out, lexcessive knee valgus bilateral, and  Functional Tests: 13.07seconds  5x sit to stand time Functional Tests:  Ely's test: 90 degreess knee flexion  Assessment RLE AROM (degrees) Right Ankle Dorsiflexion: 6 RLE Strength Right Hip Flexion: 2+/5 Right Hip Extension: 3+/5 Right Hip External Rotation : 45 Right Hip Internal Rotation : 14 Right Hip ABduction: 2+/5 Right Knee Flexion: 4/5 Right Knee Extension: 4/5 Right Ankle Dorsiflexion: 4/5 LLE AROM (degrees) Left Hip External Rotation : 45 Left Hip Internal Rotation : 14 Left Ankle Dorsiflexion: 6 LLE  Strength Left Hip Flexion: 2+/5 Left Hip Extension: 3+/5 Left Hip ABduction: 2+/5 Left Knee Flexion: 2+/5 Left Knee Extension: 3+/5 Left Ankle Dorsiflexion: 2+/5 Palpation Palpation: Lt LE Foot tender along cuboid and 5th metataqarsal, and lt hamstrings  Exercise/Treatments Hamstring 4 x 20 seconds Calf 4 x 20 seconds Quad 4 x 20 seconds  Physical Therapy Assessment and Plan PT Assessment and Plan Clinical Impression Statement: Patient displays LE stiffness and weaknesssecondary to a fall 3 weeks. patient has no prior history of fall and sdisplays a 5x sit to stand in <15 seconds indicatign good functional LE power.  Patient's pain was reproduces with LE stretches of gastrocs, quadraceps, and hamstring; follwoign stretches patient noted decreased pain. Patient will benefit from skilled physical  therapy to improve LE flexibility and strength to return to walking withotu pain to perform all ADL's and IADL's independently withtou an assistive device.  Pt will benefit from skilled therapeutic intervention in order to improve on the following deficits: Abnormal gait;Decreased activity tolerance;Decreased balance;Improper body mechanics;Increased muscle spasms;Increased fascial restricitons;Increased edema;Pain;Obesity;Difficulty walking;Impaired flexibility;Decreased strength;Decreased mobility;Decreased range of motion Rehab Potential: Good PT Frequency: Min 2X/week PT Duration: 8 weeks PT Treatment/Interventions: Gait training;Stair training;Functional mobility training;Therapeutic activities;Therapeutic exercise;Balance training;Modalities;Manual techniques;Patient/family education PT Plan: Introdeuce standing LE stretches next session, and perform Berg Balance Test to further investigate balance. Focus of therapy initially on restoring full LE AROM bilaterally. As pain and ROM improve focus to shift to increasing strength and balance.     Goals Home Exercise Program Pt/caregiver will  Perform Home Exercise Program: For increased ROM;For increased strengthening;For improved balance PT Goal: Perform Home Exercise Program - Progress: Goal set today PT Short Term Goals Time to Complete Short Term Goals: 4 weeks PT Short Term Goal 1: Paatient will be able to perform bilateral straight leg raise to 60 degrees indicatign improved hamstring mobility so patient can more easily bend over to touch toes.  PT Short Term Goal 2: Patient will discplay an Ely's test of > 110 degrees knee flexion indicating improved quadraceps mobility for ambulating down stairs PT Short Term Goal 3: patient will dmeosntrate ankle dorsiflexion AROM of >15 degrees to decrease early heel off during gait.  PT Short Term Goal 4: paitent will be able tto walk >21minutes with pain <3/10 PT Long Term Goals Time to Complete Long Term Goals: 8 weeks PT Long Term Goal 1: Patient will demonstrate increaed hamstring/quad strength of 4/5 MMT to be able to ambulate down stairs PT Long Term Goal 2: Patient will demosntrate gute med strength of 3+/5 to walk wit decreased trendeleberg gait.  Long Term Goal 3: Patient will demosntrate a BERG balance score >52 indicatign patient not at high risk for falls Long Term Goal 4: paitent will be able tto walk >13minutes without pain   Problem List Patient Active Problem List   Diagnosis Date Noted  . Stiffness of  joint, ankle and foot 01/09/2014  . Stiffness of joint of left pelvic region and thigh 01/09/2014  . Stiffness of joint of right pelvic region and thigh 01/09/2014  . Pain in joint, pelvic region and thigh 01/09/2014  . Muscle weakness (generalized) 01/09/2014  . Pain in joint, ankle and foot 01/09/2014  . Acute on chronic combined systolic and diastolic heart failure [id number]  . Microcytic anemia 10/25/2013  . CKD (chronic kidney disease) stage 3, GFR 30-59 ml/min 10/25/2013  . Pleuritic chest pain 10/24/2013  . S/P partial thyroidectomy -Right 07/28/2011  .  GASTROPARESIS 06/02/2010  . PALPITATIONS, OCCASIONAL 04/23/2010  . DIABETES MELLITUS 09/18/2008  . Morbid obesity 09/18/2008  . HYPERTENSION, UNSPECIFIED 09/18/2008  . CARDIOMYOPATHY, SECONDARY 09/18/2008  . SYSTOLIC HEART FAILURE, CHRONIC 09/18/2008    PT - End of Session Activity Tolerance: Patient tolerated treatment well General Behavior During Therapy: WFL for tasks assessed/performed PT Plan of Care PT Home Exercise Plan: hamstring, calf, qaadraped stretch in supine and prone Consulted and Agree with Plan of Care: Patient  GP Functional Assessment Tool Used: FOTO 63% limited Functional Limitation: Changing and maintaining body position Changing and Maintaining Body Position Current Status [id number]): At least 60 percent but less than 80 percent impaired, limited or restricted Changing and Maintaining Body Position Goal Status [id number]): At least 40 percent but less than 60 percent impaired, limited or restricted  Leia Alf 01/09/2014, 12:25 PM  Physician Documentation Your signature is required to indicate approval of the treatment plan as stated above.  Please sign and either send electronically or make a copy of this report for your files and return this physician signed original.   Please mark one 1.__approve of plan  2. ___approve of plan with the following conditions.   ______________________________                                                          _____________________ Physician Signature                                                                                                             Date

## 2014-01-10 NOTE — Telephone Encounter (Signed)
PLEASE CALL PT. HER IRON STORES ARE NORMAL. SHE SHOULD CALL TO COMPLETE ENDOSCOPY.

## 2014-01-11 NOTE — Telephone Encounter (Signed)
Pt is scheduled for tcs/egd on 11/20 at 8:30am.  Mailing out new instructions

## 2014-01-11 NOTE — Addendum Note (Signed)
Addended by: Idamae Schuller on: 01/11/2014 11:30 AM   Modules accepted: Orders

## 2014-01-11 NOTE — Telephone Encounter (Signed)
Pt wanted a Friday so her children can come with her to the hospital

## 2014-01-14 ENCOUNTER — Ambulatory Visit (HOSPITAL_COMMUNITY)
Admission: RE | Admit: 2014-01-14 | Payer: PRIVATE HEALTH INSURANCE | Source: Ambulatory Visit | Attending: Family Medicine | Admitting: Family Medicine

## 2014-01-14 ENCOUNTER — Telehealth (HOSPITAL_COMMUNITY): Payer: Self-pay

## 2014-01-14 NOTE — Telephone Encounter (Signed)
Called pt due to no show on 10/26 she thought the appt was on 10/27.  I told her if we had a cancellation we would give her a call

## 2014-01-17 ENCOUNTER — Ambulatory Visit (HOSPITAL_COMMUNITY)
Admission: RE | Admit: 2014-01-17 | Discharge: 2014-01-17 | Disposition: A | Discharge status: Home / Self Care | Payer: PRIVATE HEALTH INSURANCE | Source: Ambulatory Visit | Attending: Family Medicine | Admitting: Family Medicine

## 2014-01-17 DIAGNOSIS — Z5189 Encounter for other specified aftercare: Secondary | ICD-10-CM | POA: Diagnosis not present

## 2014-01-17 NOTE — Progress Notes (Signed)
Physical Therapy Treatment Patient Details  Name: [name] MRN: [id number] Date of Birth: [birth year]  Today's Date: 01/17/2014 Time: [id number] PT Time Calculation (min): 40 min Charge: Physical performance testing 1438-1450, TE 1450-1518  Visit#: 2 of 16  Re-eval: 02/08/14 Assessment Diagnosis: Bilateral knee and ankle pain and stiffness following a fall.  Next MD Visit: Criss Rosales and Iona Beard 02/08/14  Prior Therapy: no  Authorization: [employer] Medicare/Medicaid  Authorization Time Period:    Authorization Visit#: 2 of 16   Subjective: Symptoms/Limitations Symptoms: Pain free today, c/o knee tightness.  Compliant with HEP stretches Pain Assessment Currently in Pain?: No/denies  Objective:  Exercise/Treatments Mobility/Balance     Berg Balance Test Sit to Stand: Able to stand without using hands and stabilize independently Standing Unsupported: Able to stand safely 2 minutes Sitting with Back Unsupported but Feet Supported on Floor or Stool: Able to sit safely and securely 2 minutes Stand to Sit: Sits safely with minimal use of hands Transfers: Able to transfer safely, minor use of hands Standing Unsupported with Eyes Closed: Able to stand 10 seconds safely Standing Ubsupported with Feet Together: Able to place feet together independently and stand 1 minute safely From Standing, Reach Forward with Outstretched Arm: Can reach forward >12 cm safely (5") From Standing Position, Pick up Object from Floor: Able to pick up shoe safely and easily From Standing Position, Turn to Look Behind Over each Shoulder: Looks behind from both sides and weight shifts well Turn 360 Degrees: Able to turn 360 degrees safely in 4 seconds or less Standing Unsupported, Alternately Place Feet on Step/Stool: Able to stand independently and safely and complete 8 steps in 20 seconds Standing Unsupported, One Foot in Front: Able to plae foot ahead of the other independently and hold 30  seconds Standing on One Leg: Tries to lift leg/unable to hold 3 seconds but remains standing independently Total Score: 51  Stretches Active Hamstring Stretch: 3 reps;30 seconds;Limitations Active Hamstring Stretch Limitations: 14in step Bil LE Quad Stretch: 3 reps;30 seconds;Limitations Quad Stretch Limitations: prone with rope Hip Flexor Stretch: 2 reps;30 seconds;Limitations Hip Flexor Stretch Limitations: 14in step Gastroc Stretch: 3 reps;30 seconds    Physical Therapy Assessment and Plan PT Assessment and Plan Clinical Impression Statement: BERG balance test complete 51/56, difficulty with SLS due to weakness.  Session focus on instructing standing stretches for Bil LE, pt able to demonstrate all stretches with min cueing for form.  Began recipcoal bicycle to reduce tightness and improve AROM.  No reports of pain through session. PT Plan: Continue wiht current PT POC.  Begin 3D excursion next sessoin.  Focus of therapy initially on restoring full LE AROM bilaterally. As pain and ROM improve focus to shift to increasing strength and balance.     Goals PT Short Term Goals PT Short Term Goal 1: Paatient will be able to perform bilateral straight leg raise to 60 degrees indicatign improved hamstring mobility so patient can more easily bend over to touch toes.  PT Short Term Goal 1 - Progress: Progressing toward goal PT Short Term Goal 2: Patient will discplay an Ely's test of > 110 degrees knee flexion indicating improved quadraceps mobility for ambulating down stairs PT Short Term Goal 2 - Progress: Progressing toward goal PT Short Term Goal 3: patient will dmeosntrate ankle dorsiflexion AROM of >15 degrees to decrease early heel off during gait.  PT Short Term Goal 3 - Progress: Progressing toward goal PT Short Term Goal 4: paitent will be able tto walk >  66minutes with pain <3/10 PT Short Term Goal 4 - Progress: Progressing toward goal PT Long Term Goals PT Long Term Goal 1: Patient  will demonstrate increaed hamstring/quad strength of 4/5 MMT to be able to ambulate down stairs PT Long Term Goal 2: Patient will demosntrate gute med strength of 3+/5 to walk wit decreased trendeleberg gait.  Long Term Goal 3: Patient will demosntrate a BERG balance score >52 indicatign patient not at high risk for falls Long Term Goal 4: paitent will be able tto walk >61minutes without pain   Problem List Patient Active Problem List   Diagnosis Date Noted  . Stiffness of joint, ankle and foot 01/09/2014  . Stiffness of joint of left pelvic region and thigh 01/09/2014  . Stiffness of joint of right pelvic region and thigh 01/09/2014  . Pain in joint, pelvic region and thigh 01/09/2014  . Muscle weakness (generalized) 01/09/2014  . Pain in joint, ankle and foot 01/09/2014  . Acute on chronic combined systolic and diastolic heart failure 99991111  . Microcytic anemia 10/25/2013  . CKD (chronic kidney disease) stage 3, GFR 30-59 ml/min 10/25/2013  . Pleuritic chest pain 10/24/2013  . S/P partial thyroidectomy -Right 07/28/2011  . GASTROPARESIS 06/02/2010  . PALPITATIONS, OCCASIONAL 04/23/2010  . DIABETES MELLITUS 09/18/2008  . Morbid obesity 09/18/2008  . HYPERTENSION, UNSPECIFIED 09/18/2008  . CARDIOMYOPATHY, SECONDARY 09/18/2008  . SYSTOLIC HEART FAILURE, CHRONIC 09/18/2008    PT - End of Session Activity Tolerance: Patient tolerated treatment well General Behavior During Therapy: WFL for tasks assessed/performed PT Plan of Care PT Home Exercise Plan: Standing LE stretches  GP    Aldona Lento 01/17/2014, 3:38 PM

## 2014-01-18 ENCOUNTER — Encounter: Payer: Self-pay | Admitting: Internal Medicine

## 2014-01-22 ENCOUNTER — Ambulatory Visit (HOSPITAL_COMMUNITY)
Admission: RE | Admit: 2014-01-22 | Discharge: 2014-01-22 | Disposition: A | Discharge status: Home / Self Care | Payer: PRIVATE HEALTH INSURANCE | Source: Ambulatory Visit | Attending: Family Medicine | Admitting: Family Medicine

## 2014-01-22 ENCOUNTER — Encounter (HOSPITAL_COMMUNITY): Payer: Self-pay | Admitting: Physical Therapy

## 2014-01-22 ENCOUNTER — Ambulatory Visit (HOSPITAL_COMMUNITY): Payer: PRIVATE HEALTH INSURANCE | Admitting: Physical Therapy

## 2014-01-22 DIAGNOSIS — Z5189 Encounter for other specified aftercare: Secondary | ICD-10-CM | POA: Insufficient documentation

## 2014-01-22 DIAGNOSIS — M25652 Stiffness of left hip, not elsewhere classified: Secondary | ICD-10-CM | POA: Diagnosis not present

## 2014-01-22 DIAGNOSIS — M25673 Stiffness of unspecified ankle, not elsewhere classified: Secondary | ICD-10-CM | POA: Insufficient documentation

## 2014-01-22 DIAGNOSIS — I10 Essential (primary) hypertension: Secondary | ICD-10-CM | POA: Insufficient documentation

## 2014-01-22 DIAGNOSIS — M25571 Pain in right ankle and joints of right foot: Secondary | ICD-10-CM | POA: Diagnosis not present

## 2014-01-22 DIAGNOSIS — M25572 Pain in left ankle and joints of left foot: Secondary | ICD-10-CM | POA: Diagnosis not present

## 2014-01-22 DIAGNOSIS — M25561 Pain in right knee: Secondary | ICD-10-CM | POA: Insufficient documentation

## 2014-01-22 DIAGNOSIS — M25562 Pain in left knee: Secondary | ICD-10-CM | POA: Insufficient documentation

## 2014-01-22 DIAGNOSIS — E663 Overweight: Secondary | ICD-10-CM | POA: Insufficient documentation

## 2014-01-22 DIAGNOSIS — M25552 Pain in left hip: Secondary | ICD-10-CM

## 2014-01-22 DIAGNOSIS — M6281 Muscle weakness (generalized): Secondary | ICD-10-CM | POA: Diagnosis not present

## 2014-01-22 DIAGNOSIS — M25651 Stiffness of right hip, not elsewhere classified: Secondary | ICD-10-CM | POA: Diagnosis not present

## 2014-01-22 DIAGNOSIS — E119 Type 2 diabetes mellitus without complications: Secondary | ICD-10-CM | POA: Diagnosis not present

## 2014-01-22 DIAGNOSIS — M25672 Stiffness of left ankle, not elsewhere classified: Secondary | ICD-10-CM

## 2014-01-22 NOTE — Therapy (Signed)
Physical Therapy Treatment  Patient Details  Name: AERICA [name] MRN: [id number] Date of Birth: 06/26/1945  Encounter Date: 01/22/2014      PT End of Session - 01/22/14 1451    Visit Number 3   Number of Visits 16   Date for PT Re-Evaluation 02/08/14   PT Start Time [id number]   PT Stop Time 1520   PT Time Calculation (min) 33 min   Activity Tolerance Patient limited by pain      Past Medical History  Diagnosis Date  . Systolic heart failure     Chronic  . Overweight(278.02)   . Cardiomyopathy secondary   . Hypertension     takes Diovan and Spirololactone daily  . Dysrhythmia     pt takes Digoxin and Carvedilol daily  . CHF (congestive heart failure)     takes Lasix prn  . Sleep apnea     doesn't use CPAP  . Pneumonia     hx of;many yrs ago  . Peripheral neuropathy   . Arthritis     ankles  . Gout     hx of x 1 time in Aug 2012  . GERD (gastroesophageal reflux disease)     takes Protonix seldom  . H/O hiatal hernia   . Hemorrhoid   . Anemia     takes Ferrous Fumarate 2 times week  . Blood transfusion     hx of-74yrs ago  . Thyroid nodule     right  . Diabetes mellitus     takes Glipizide and Metformin daily  . Cataract     bilateral and immature    Past Surgical History  Procedure Laterality Date  . Cardiac defirillator removed      removed <56months;removed d/t shocking pt 15times;pierced sac of heart 1000cc ;drained  . Stomach stapling surgery  80's  . Appendectomy  1991  . Tummy tuck  1991  . Leg lipoma    . Abdominal hysterectomy  1991  . Tubal ligation  1971  . Ovarian cyst removed  1991  . Cholecystectomy open  1992  . Colonoscopy    . Thyroid lobectomy  07/27/2011    Procedure: THYROID LOBECTOMY;  Surgeon: Gayland Curry, MD,FACS;  Location: [facility];  Service: General;  Laterality: Right;  . Cardiac defibrillator placement  >35yrs ago  . Defibrillator removed      There were no vitals taken for this visit.  Visit Diagnosis:  Stiffness of  joint, ankle and foot, left  Stiffness of joint of right pelvic region and thigh  Pain in joint, pelvic region and thigh, left  Muscle weakness (generalized)  Pain in joint, ankle and foot, left          Adult PT Treatment/Exercise - 01/22/14 0700    Exercises Knee/Hip   Active Hamstring Stretch 3 reps;30 seconds;Limitations   Active Hamstring Stretch Limitations 14in step Bil LE   Quad Stretch 3 reps;30 seconds;Limitations   Quad Stretch Limitations prone with rope   Hip Flexor Stretch 2 reps;30 seconds;Limitations   Hip Flexor Stretch Limitations 14in step   Gastroc Stretch 3 reps;30 seconds   Heel Raises 10 reps;Limitations  TR   Other Standing Knee Exercises 3d hip excursions x10   Long Arc Quad 15 reps;Limitations  add weight as to pain tolerance   Other Seated Knee Exercises hip flexion x10 B   Bridges 10 reps   Bridges Limitations 3" holds   Clams x15 B   Hamstring Curl 15 reps  Plan - 01/22/14 1524    Clinical Impression Statement Unaware of pt being added at last minute, check in 1439. Added strengthening today which patient was limited somewhat due to L knee pain 5/10- Increased 6/10 by end of session. Patient declined bike due to pain today as well.   PT Plan Continue wiht current PT POC.    Focus of therapy initially on restoring full LE AROM bilaterally. As pain and ROM improve focus to shift to increasing strength and balance. Progress HEP to include strength not only stretching next visit        Problem List Patient Active Problem List   Diagnosis Date Noted  . Stiffness of joint, ankle and foot 01/09/2014  . Stiffness of joint of left pelvic region and thigh 01/09/2014  . Stiffness of joint of right pelvic region and thigh 01/09/2014  . Pain in joint, pelvic region and thigh 01/09/2014  . Muscle weakness (generalized) 01/09/2014  . Pain in joint, ankle and foot 01/09/2014  . Acute on chronic combined systolic and diastolic  heart failure 99991111  . Microcytic anemia 10/25/2013  . CKD (chronic kidney disease) stage 3, GFR 30-59 ml/min 10/25/2013  . Pleuritic chest pain 10/24/2013  . S/P partial thyroidectomy -Right 07/28/2011  . GASTROPARESIS 06/02/2010  . PALPITATIONS, OCCASIONAL 04/23/2010  . DIABETES MELLITUS 09/18/2008  . Morbid obesity 09/18/2008  . HYPERTENSION, UNSPECIFIED 09/18/2008  . CARDIOMYOPATHY, SECONDARY 09/18/2008  . SYSTOLIC HEART FAILURE, CHRONIC 09/18/2008                                            Rhaelyn Giron, [place] 01/22/2014, 3:29 PM

## 2014-01-24 ENCOUNTER — Ambulatory Visit (HOSPITAL_COMMUNITY)
Admission: RE | Admit: 2014-01-24 | Discharge: 2014-01-24 | Disposition: A | Discharge status: Home / Self Care | Payer: PRIVATE HEALTH INSURANCE | Source: Ambulatory Visit | Attending: Family Medicine | Admitting: Family Medicine

## 2014-01-24 DIAGNOSIS — M25572 Pain in left ankle and joints of left foot: Secondary | ICD-10-CM

## 2014-01-24 DIAGNOSIS — Z5189 Encounter for other specified aftercare: Secondary | ICD-10-CM | POA: Diagnosis not present

## 2014-01-24 DIAGNOSIS — M6281 Muscle weakness (generalized): Secondary | ICD-10-CM

## 2014-01-24 DIAGNOSIS — M25672 Stiffness of left ankle, not elsewhere classified: Secondary | ICD-10-CM

## 2014-01-24 NOTE — Therapy (Signed)
Physical Therapy Treatment  Patient Details  Name: [name] MRN: [id number] Date of Birth: [birth year]  Encounter Date: 01/24/2014      PT End of Session - 01/24/14 1128    Visit Number 4   Number of Visits 16   Date for PT Re-Evaluation 02/08/14   Authorization Type medicare (gcodes)   Authorization - Visit Number 4   Authorization - Number of Visits 10   PT Start Time 1104   PT Stop Time 1142   PT Time Calculation (min) 38 min   Activity Tolerance Patient limited by pain;Patient tolerated treatment well      Past Medical History  Diagnosis Date  . Systolic heart failure     Chronic  . Overweight(278.02)   . Cardiomyopathy secondary   . Hypertension     takes Diovan and Spirololactone daily  . Dysrhythmia     pt takes Digoxin and Carvedilol daily  . CHF (congestive heart failure)     takes Lasix prn  . Sleep apnea     doesn't use CPAP  . Pneumonia     hx of;many yrs ago  . Peripheral neuropathy   . Arthritis     ankles  . Gout     hx of x 1 time in Aug 2012  . GERD (gastroesophageal reflux disease)     takes Protonix seldom  . H/O hiatal hernia   . Hemorrhoid   . Anemia     takes Ferrous Fumarate 2 times week  . Blood transfusion     hx of-72yrs ago  . Thyroid nodule     right  . Diabetes mellitus     takes Glipizide and Metformin daily  . Cataract     bilateral and immature    Past Surgical History  Procedure Laterality Date  . Cardiac defirillator removed      removed <52months;removed d/t shocking pt 15times;pierced sac of heart 1000cc ;drained  . Stomach stapling surgery  80's  . Appendectomy  1991  . Tummy tuck  1991  . Leg lipoma    . Abdominal hysterectomy  1991  . Tubal ligation  1971  . Ovarian cyst removed  1991  . Cholecystectomy open  1992  . Colonoscopy    . Thyroid lobectomy  07/27/2011    Procedure: THYROID LOBECTOMY;  Surgeon: Gayland Curry, MD,FACS;  Location: [facility];  Service: General;  Laterality: Right;  . Cardiac  defibrillator placement  >37yrs ago  . Defibrillator removed      There were no vitals taken for this visit.  Visit Diagnosis:  Stiffness of joint, ankle and foot, left  Muscle weakness (generalized)  Pain in joint, ankle and foot, left          [facility] Adult PT Treatment/Exercise - 01/24/14 1111    Exercises   Exercises Knee/Hip   Knee/Hip Exercises: Stretches   Active Hamstring Stretch 3 reps;30 seconds;Limitations   Active Hamstring Stretch Limitations 14in step Bil LE   Hip Flexor Stretch 2 reps;30 seconds;Limitations   Hip Flexor Stretch Limitations 14in step   Gastroc Stretch 3 reps;30 seconds;Limitations   Gastroc Stretch Limitations slant board   Knee/Hip Exercises: Aerobic   Stationary Bike nustep at end of session 10' level 3, hills #2 UE/LE   Knee/Hip Exercises: Standing   Heel Raises 15 reps;Limitations   Heel Raises Limitations toeraises 15 reps   Forward Lunges 10 reps   Forward Lunges Limitations onto 6" box   Functional Squat 10 reps  Other Standing Knee Exercises 3d hip excursions x10            PT Short Term Goals - 01/24/14 1129    PT SHORT TERM GOAL #1   Title Pt/caregiver will Perform Home Exercise Program: For increased ROM;For increased strengthening;For improved balance   Time 4   Period Weeks   Status On-going   PT SHORT TERM GOAL #2   Title Patient will be able to perform bilateral straight leg raise to 60 degrees indicatign improved hamstring mobility so patient can more easily bend over to touch toes.     Time 4   Period Weeks   Status On-going   PT SHORT TERM GOAL #3   Title Patient will display an Ely's test of > 110 degrees knee flexion indicating improved quadraceps mobility for ambulating down stairs   Time 4   Period Weeks   Status On-going   PT SHORT TERM GOAL #4   Title patient will demonstrate ankle dorsiflexion AROM of >15 degrees to decrease early heel off during gait   Time 4   Period Weeks   Status On-going   PT  SHORT TERM GOAL #5   Title patient will be able tto walk >23minutes with pain <3/10   Time 4   Period Weeks   Status On-going          PT Long Term Goals - 01/24/14 1133    PT LONG TERM GOAL #1   Title Patient will demonstrate increaed hamstring/quad strength of 4/5 MMT to be able to ambulate down stairs   Time 8   Period Weeks   Status On-going   PT LONG TERM GOAL #2   Title Patient will demosntrate gute med strength of 3+/5 to walk wit decreased trendeleberg gait.     Time 8   Period Weeks   Status On-going   PT LONG TERM GOAL #3   Title Patient will demosntrate a BERG balance score >52 indicating patient not at high risk for falls   Time 8   Period Weeks   Status On-going   PT LONG TERM GOAL #4   Title patient will be able tto walk >72minutes without pain   Time 8   Period Weeks   Status On-going          Plan - 01/24/14 1136    Clinical Impression Statement Pt able to complete all actvities today without c/o pain.  Continued to focus on strength and ROM with addition of nustep to increase actvitiy tolerance.  progressed with forward lunges and squats without difficutly.  Pt required therapist facilitation for form.    PT Plan Continue wiht current PT POC.    Focus of therapy initially on restoring full LE AROM bilaterally. As pain and ROM improve focus to shift to increasing strength and balance.        Problem List Patient Active Problem List   Diagnosis Date Noted  . Stiffness of joint, ankle and foot 01/09/2014  . Stiffness of joint of left pelvic region and thigh 01/09/2014  . Stiffness of joint of right pelvic region and thigh 01/09/2014  . Pain in joint, pelvic region and thigh 01/09/2014  . Muscle weakness (generalized) 01/09/2014  . Pain in joint, ankle and foot 01/09/2014  . Acute on chronic combined systolic and diastolic heart failure 99991111  . Microcytic anemia 10/25/2013  . CKD (chronic kidney disease) stage 3, GFR 30-59 ml/min 10/25/2013   . Pleuritic chest pain 10/24/2013  . S/P  partial thyroidectomy -Right 07/28/2011  . GASTROPARESIS 06/02/2010  . PALPITATIONS, OCCASIONAL 04/23/2010  . DIABETES MELLITUS 09/18/2008  . Morbid obesity 09/18/2008  . HYPERTENSION, UNSPECIFIED 09/18/2008  . CARDIOMYOPATHY, SECONDARY 09/18/2008  . SYSTOLIC HEART FAILURE, CHRONIC 09/18/2008      Teena Irani, PTA/CLT 01/24/2014, 11:45 AM

## 2014-01-29 ENCOUNTER — Ambulatory Visit (HOSPITAL_COMMUNITY)
Admission: RE | Admit: 2014-01-29 | Discharge: 2014-01-29 | Disposition: A | Discharge status: Home / Self Care | Payer: PRIVATE HEALTH INSURANCE | Source: Ambulatory Visit | Attending: Family Medicine | Admitting: Family Medicine

## 2014-01-29 DIAGNOSIS — M25672 Stiffness of left ankle, not elsewhere classified: Secondary | ICD-10-CM

## 2014-01-29 DIAGNOSIS — M6281 Muscle weakness (generalized): Secondary | ICD-10-CM

## 2014-01-29 DIAGNOSIS — M25552 Pain in left hip: Secondary | ICD-10-CM

## 2014-01-29 DIAGNOSIS — M25572 Pain in left ankle and joints of left foot: Secondary | ICD-10-CM

## 2014-01-29 DIAGNOSIS — M25651 Stiffness of right hip, not elsewhere classified: Secondary | ICD-10-CM

## 2014-01-29 DIAGNOSIS — Z5189 Encounter for other specified aftercare: Secondary | ICD-10-CM | POA: Diagnosis not present

## 2014-01-29 DIAGNOSIS — M25652 Stiffness of left hip, not elsewhere classified: Secondary | ICD-10-CM

## 2014-01-29 NOTE — Therapy (Signed)
Physical Therapy Treatment  Patient Details  Name: [name] MRN: [id number] Date of Birth: 03/08/1946  Encounter Date: 01/29/2014      PT End of Session - 01/29/14 1127    Visit Number 5   Number of Visits 16   Date for PT Re-Evaluation 02/08/14   Authorization Type medicare (gcodes)   Authorization - Visit Number 5   Authorization - Number of Visits 10   PT Start Time (ACUTE ONLY) 1104   PT Stop Time (ACUTE ONLY) 1142   PT Time Calculation (min) (ACUTE ONLY) 38 min      Past Medical History  Diagnosis Date  . Systolic heart failure     Chronic  . Overweight(278.02)   . Cardiomyopathy secondary   . Hypertension     takes Diovan and Spirololactone daily  . Dysrhythmia     pt takes Digoxin and Carvedilol daily  . CHF (congestive heart failure)     takes Lasix prn  . Sleep apnea     doesn't use CPAP  . Pneumonia     hx of;many yrs ago  . Peripheral neuropathy   . Arthritis     ankles  . Gout     hx of x 1 time in Aug 2012  . GERD (gastroesophageal reflux disease)     takes Protonix seldom  . H/O hiatal hernia   . Hemorrhoid   . Anemia     takes Ferrous Fumarate 2 times week  . Blood transfusion     hx of-3yrs ago  . Thyroid nodule     right  . Diabetes mellitus     takes Glipizide and Metformin daily  . Cataract     bilateral and immature    Past Surgical History  Procedure Laterality Date  . Cardiac defirillator removed      removed <3months;removed d/t shocking pt 15times;pierced sac of heart 1000cc ;drained  . Stomach stapling surgery  80's  . Appendectomy  1991  . Tummy tuck  1991  . Leg lipoma    . Abdominal hysterectomy  1991  . Tubal ligation  1971  . Ovarian cyst removed  1991  . Cholecystectomy open  1992  . Colonoscopy    . Thyroid lobectomy  07/27/2011    Procedure: THYROID LOBECTOMY;  Surgeon: Eric M Wilson, MD,FACS;  Location: [facility];  Service: General;  Laterality: Right;  . Cardiac defibrillator placement  >3yrs ago  .  Defibrillator removed      There were no vitals taken for this visit.  Visit Diagnosis:  Stiffness of joint, ankle and foot, left  Muscle weakness (generalized)  Pain in joint, ankle and foot, left  Stiffness of joint of right pelvic region and thigh  Pain in joint, pelvic region and thigh, left  Stiffness of joint of left pelvic region and thigh      Subjective Assessment - 01/29/14 1110    Symptoms Lt knee pain scale 5/10, complaint with HEP 3-4x a day   Currently in Pain? Yes   Pain Score 5    Pain Location Knee   Pain Orientation Left          [facility] PT Assessment - 01/29/14 0700    Assessment   Medical Diagnosis Bil knee and ankle pain and stiffness following fall   Next MD Visit [facility] 02/08/14    Prior Therapy no          [facility] Adult PT Treatment/Exercise - 01/29/14 1142      Knee/Hip Exercises: Stretches   Active Hamstring Stretch 3 reps;30 seconds;Limitations   Active Hamstring Stretch Limitations 14in step Bil LE 3 directions   Quad Stretch 3 reps;30 seconds;Limitations   Quad Stretch Limitations prone with rope   Hip Flexor Stretch 2 reps;30 seconds;Limitations   Hip Flexor Stretch Limitations 14in step hip flexon and grpin stretch   Gastroc Stretch 3 reps;30 seconds;Limitations   Gastroc Stretch Limitations slant board   Knee/Hip Exercises: Aerobic   Stationary Bike nustep not available   Knee/Hip Exercises: Standing   Heel Raises 15 reps;Limitations   Heel Raises Limitations toeraises 15 reps   Forward Lunges 10 reps   Forward Lunges Limitations onto 6" box   Side Lunges Both;10 reps;Limitations   Side Lunges Limitations 6in step   Functional Squat 10 reps   Functional Squat Limitations 3D hip excursion            PT Short Term Goals - 01/29/14 1140    PT SHORT TERM GOAL #1   Title Pt/caregiver will Perform Home Exercise Program: For increased ROM;For increased strengthening;For improved balance   Status On-going   PT  SHORT TERM GOAL #2   Title Patient will be able to perform bilateral straight leg raise to 60 degrees indicatign improved hamstring mobility so patient can more easily bend over to touch toes.     Status On-going   PT SHORT TERM GOAL #3   Title Patient will display an Ely's test of > 110 degrees knee flexion indicating improved quadraceps mobility for ambulating down stairs   Status Not Met   PT SHORT TERM GOAL #4   Title patient will demonstrate ankle dorsiflexion AROM of >15 degrees to decrease early heel off during gait   Status On-going   PT SHORT TERM GOAL #5   Title patient will be able tto walk >15minutes with pain <3/10   Status On-going          PT Long Term Goals - 01/29/14 1140    PT LONG TERM GOAL #1   Title Patient will demonstrate increaed hamstring/quad strength of 4/5 MMT to be able to ambulate down stairs   Status On-going   PT LONG TERM GOAL #2   Title Patient will demosntrate gute med strength of 3+/5 to walk wit decreased trendeleberg gait.     Status On-going   PT LONG TERM GOAL #3   Title Patient will demosntrate a BERG balance score >52 indicating patient not at high risk for falls   PT LONG TERM GOAL #4   Title patient will be able tto walk >30minutes without pain          Plan - 01/29/14 1128    Clinical Impression Statement Continued stretches for Bil LE flexibilty, mobility exercises and progress LE strengthening this session.  Added 3D excursion to improve ankle mobility without difficulty following demonstraction and cueing for techniuqe.  Began side lunges for glut med strengthening with cueing for form and technique.  Pt limited bu fatigue at end of session, Nustep not available at end of session.     PT Plan Continue wiht current PT POC.    Focus of therapy initially on restoring full LE AROM bilaterally. As pain and ROM improve focus to shift to increasing strength and balance.        Problem List Patient Active Problem List   Diagnosis Date  Noted  . Stiffness of joint, ankle and foot 01/09/2014  . Stiffness of joint of left pelvic region and   thigh 01/09/2014  . Stiffness of joint of right pelvic region and thigh 01/09/2014  . Pain in joint, pelvic region and thigh 01/09/2014  . Muscle weakness (generalized) 01/09/2014  . Pain in joint, ankle and foot 01/09/2014  . Acute on chronic combined systolic and diastolic heart failure 59/45/8592  . Microcytic anemia 10/25/2013  . CKD (chronic kidney disease) stage 3, GFR 30-59 ml/min 10/25/2013  . Pleuritic chest pain 10/24/2013  . S/P partial thyroidectomy -Right 07/28/2011  . GASTROPARESIS 06/02/2010  . PALPITATIONS, OCCASIONAL 04/23/2010  . DIABETES MELLITUS 09/18/2008  . Morbid obesity 09/18/2008  . HYPERTENSION, UNSPECIFIED 09/18/2008  . CARDIOMYOPATHY, SECONDARY 09/18/2008  . SYSTOLIC HEART FAILURE, CHRONIC 09/18/2008   Aldona Lento, PTA Aldona Lento 01/29/2014, 5:53 PM

## 2014-01-31 ENCOUNTER — Ambulatory Visit (HOSPITAL_COMMUNITY)
Admission: RE | Admit: 2014-01-31 | Discharge: 2014-01-31 | Disposition: A | Discharge status: Home / Self Care | Payer: PRIVATE HEALTH INSURANCE | Source: Ambulatory Visit | Attending: Family Medicine | Admitting: Family Medicine

## 2014-01-31 DIAGNOSIS — M25552 Pain in left hip: Secondary | ICD-10-CM

## 2014-01-31 DIAGNOSIS — M25672 Stiffness of left ankle, not elsewhere classified: Secondary | ICD-10-CM

## 2014-01-31 DIAGNOSIS — Z5189 Encounter for other specified aftercare: Secondary | ICD-10-CM | POA: Diagnosis not present

## 2014-01-31 DIAGNOSIS — M25651 Stiffness of right hip, not elsewhere classified: Secondary | ICD-10-CM

## 2014-01-31 DIAGNOSIS — M25652 Stiffness of left hip, not elsewhere classified: Secondary | ICD-10-CM

## 2014-01-31 DIAGNOSIS — M6281 Muscle weakness (generalized): Secondary | ICD-10-CM

## 2014-01-31 DIAGNOSIS — M25572 Pain in left ankle and joints of left foot: Secondary | ICD-10-CM

## 2014-01-31 NOTE — Therapy (Unsigned)
Physical Therapy Treatment  Patient Details  Name: [name] MRN: [id number] Date of Birth: Aug 19, 1945  Encounter Date: 01/31/2014      PT End of Session - 01/31/14 1139    Visit Number 6   Number of Visits 16   Date for PT Re-Evaluation 02/08/14   Authorization Type medicare (gcodes)   Authorization - Visit Number 6   Authorization - Number of Visits 10   PT Start Time (ACUTE ONLY) 1107   PT Stop Time (ACUTE ONLY) 1155   PT Time Calculation (min) (ACUTE ONLY) 48 min      Past Medical History  Diagnosis Date  . Systolic heart failure     Chronic  . Overweight(278.02)   . Cardiomyopathy secondary   . Hypertension     takes Diovan and Spirololactone daily  . Dysrhythmia     pt takes Digoxin and Carvedilol daily  . CHF (congestive heart failure)     takes Lasix prn  . Sleep apnea     doesn't use CPAP  . Pneumonia     hx of;many yrs ago  . Peripheral neuropathy   . Arthritis     ankles  . Gout     hx of x 1 time in Aug 2012  . GERD (gastroesophageal reflux disease)     takes Protonix seldom  . H/O hiatal hernia   . Hemorrhoid   . Anemia     takes Ferrous Fumarate 2 times week  . Blood transfusion     hx of-40yrs ago  . Thyroid nodule     right  . Diabetes mellitus     takes Glipizide and Metformin daily  . Cataract     bilateral and immature    Past Surgical History  Procedure Laterality Date  . Cardiac defirillator removed      removed <23months;removed d/t shocking pt 15times;pierced sac of heart 1000cc ;drained  . Stomach stapling surgery  80's  . Appendectomy  1991  . Tummy tuck  1991  . Leg lipoma    . Abdominal hysterectomy  1991  . Tubal ligation  1971  . Ovarian cyst removed  1991  . Cholecystectomy open  1992  . Colonoscopy    . Thyroid lobectomy  07/27/2011    Procedure: THYROID LOBECTOMY;  Surgeon: Gayland Curry, MD,FACS;  Location: [facility];  Service: General;  Laterality: Right;  . Cardiac defibrillator placement  >2yrs ago  .  Defibrillator removed      There were no vitals taken for this visit.  Visit Diagnosis:  Stiffness of joint, ankle and foot, left  Muscle weakness (generalized)  Pain in joint, ankle and foot, left  Stiffness of joint of right pelvic region and thigh  Pain in joint, pelvic region and thigh, left  Stiffness of joint of left pelvic region and thigh      Subjective Assessment - 01/31/14 1111    Symptoms Pt states that she is not doing her exercises on a regular basis    Pain Score 4    Pain Location Knee   Pain Orientation Left            [facility] Adult PT Treatment/Exercise - 01/31/14 1113    Exercises   Exercises Knee/Hip   Knee/Hip Exercises: Stretches   Active Hamstring Stretch 3 reps;30 seconds   Active Hamstring Stretch Limitations 14in step Bil LE 3 directions   Gastroc Stretch 3 reps;30 seconds   Gastroc Stretch Limitations slant board   Knee/Hip Exercises:  Aerobic   Stationary Bike nustep at end of session 10' level 3, hills #2 UE/LE   Knee/Hip Exercises: Standing   Heel Raises 15 reps   Knee Flexion 10 reps;Both   Knee Flexion Limitations 3#   Forward Lunges 5 reps;10 reps   Forward Lunges Limitations onto 6" box   Side Lunges Both;10 reps;Limitations   Side Lunges Limitations 6in step   Lateral Step Up 10 reps   Forward Step Up 10 reps   Functional Squat 10 reps   SLS 3x    Knee/Hip Exercises: Seated   Long Arc Quad 15 reps   Long Arc Quad Weight 3 lbs.                Plan - 01/31/14 1150    Clinical Impression Statement (ACUTE ONLY) continue with progressing strength and flexibiility         Problem List Patient Active Problem List   Diagnosis Date Noted  . Stiffness of joint, ankle and foot 01/09/2014  . Stiffness of joint of left pelvic region and thigh 01/09/2014  . Stiffness of joint of right pelvic region and thigh 01/09/2014  . Pain in joint, pelvic region and thigh 01/09/2014  . Muscle weakness (generalized) 01/09/2014  .  Pain in joint, ankle and foot 01/09/2014  . Acute on chronic combined systolic and diastolic heart failure 99991111  . Microcytic anemia 10/25/2013  . CKD (chronic kidney disease) stage 3, GFR 30-59 ml/min 10/25/2013  . Pleuritic chest pain 10/24/2013  . S/P partial thyroidectomy -Right 07/28/2011  . GASTROPARESIS 06/02/2010  . PALPITATIONS, OCCASIONAL 04/23/2010  . DIABETES MELLITUS 09/18/2008  . Morbid obesity 09/18/2008  . HYPERTENSION, UNSPECIFIED 09/18/2008  . CARDIOMYOPATHY, SECONDARY 09/18/2008  . SYSTOLIC HEART FAILURE, CHRONIC 09/18/2008                                          Rita Vialpando,CINDY PT  01/31/2014, 11:50 AM

## 2014-02-05 ENCOUNTER — Ambulatory Visit (HOSPITAL_COMMUNITY): Payer: PRIVATE HEALTH INSURANCE | Admitting: Physical Therapy

## 2014-02-06 ENCOUNTER — Other Ambulatory Visit (HOSPITAL_COMMUNITY): Payer: Self-pay | Admitting: Nephrology

## 2014-02-06 DIAGNOSIS — N183 Chronic kidney disease, stage 3 unspecified: Secondary | ICD-10-CM

## 2014-02-07 ENCOUNTER — Ambulatory Visit (HOSPITAL_COMMUNITY)
Admission: RE | Admit: 2014-02-07 | Discharge: 2014-02-07 | Disposition: A | Discharge status: Home / Self Care | Payer: PRIVATE HEALTH INSURANCE | Source: Ambulatory Visit | Attending: Family Medicine | Admitting: Family Medicine

## 2014-02-07 DIAGNOSIS — M6281 Muscle weakness (generalized): Secondary | ICD-10-CM

## 2014-02-07 DIAGNOSIS — M25652 Stiffness of left hip, not elsewhere classified: Secondary | ICD-10-CM

## 2014-02-07 DIAGNOSIS — M25572 Pain in left ankle and joints of left foot: Secondary | ICD-10-CM

## 2014-02-07 DIAGNOSIS — Z5189 Encounter for other specified aftercare: Secondary | ICD-10-CM | POA: Diagnosis not present

## 2014-02-07 DIAGNOSIS — M25672 Stiffness of left ankle, not elsewhere classified: Secondary | ICD-10-CM

## 2014-02-07 DIAGNOSIS — M25651 Stiffness of right hip, not elsewhere classified: Secondary | ICD-10-CM

## 2014-02-07 DIAGNOSIS — M25552 Pain in left hip: Secondary | ICD-10-CM

## 2014-02-07 NOTE — Therapy (Signed)
Physical Therapy Treatment  Patient Details  Name: [name] MRN: [id number] Date of Birth: November 27, 1945  Encounter Date: 02/07/2014      PT End of Session - 02/07/14 1126    Visit Number 7   Number of Visits 16   Date for PT Re-Evaluation 02/08/14   Authorization Type medicare   Authorization - Visit Number 7   Authorization - Number of Visits 10   PT Start Time 1110   PT Stop Time 1146   PT Time Calculation (min) 36 min   Activity Tolerance Patient limited by pain;Patient tolerated treatment well   Behavior During Therapy [facility] for tasks assessed/performed      Past Medical History  Diagnosis Date  . Systolic heart failure     Chronic  . Overweight(278.02)   . Cardiomyopathy secondary   . Hypertension     takes Diovan and Spirololactone daily  . Dysrhythmia     pt takes Digoxin and Carvedilol daily  . CHF (congestive heart failure)     takes Lasix prn  . Sleep apnea     doesn't use CPAP  . Pneumonia     hx of;many yrs ago  . Peripheral neuropathy   . Arthritis     ankles  . Gout     hx of x 1 time in Aug 2012  . GERD (gastroesophageal reflux disease)     takes Protonix seldom  . H/O hiatal hernia   . Hemorrhoid   . Anemia     takes Ferrous Fumarate 2 times week  . Blood transfusion     hx of-59yr ago  . Thyroid nodule     right  . Diabetes mellitus     takes Glipizide and Metformin daily  . Cataract     bilateral and immature    Past Surgical History  Procedure Laterality Date  . Cardiac defirillator removed      removed <337monthremoved d/t shocking pt 15times;pierced sac of heart 1000cc ;drained  . Stomach stapling surgery  80's  . Appendectomy  1991  . Tummy tuck  1991  . Leg lipoma    . Abdominal hysterectomy  1991  . Tubal ligation  1971  . Ovarian cyst removed  1991  . Cholecystectomy open  1992  . Colonoscopy    . Thyroid lobectomy  07/27/2011    Procedure: THYROID LOBECTOMY;  Surgeon: ErGayland CurryMD,FACS;  Location: [facility]  Service: General;  Laterality: Right;  . Cardiac defibrillator placement  >3y75yrgo  . Defibrillator removed      There were no vitals taken for this visit.  Visit Diagnosis:  Stiffness of joint, ankle and foot, left  Muscle weakness (generalized)  Pain in joint, ankle and foot, left  Stiffness of joint of right pelvic region and thigh  Pain in joint, pelvic region and thigh, left  Stiffness of joint of left pelvic region and thigh      Subjective Assessment - 02/07/14 1112    Symptoms [name] states her knees had been feeling better until the last 24 hours when her Rt knee began to bother her more, Patient stataes she would like more phsyical therapy but due to transportation problems patient is unable to continue therapy.    Pain Score 5   ache   Pain Location Knee   Pain Orientation Left          [facility] PT Assessment - 02/07/14 0001    Assessment   Medical Diagnosis Bil knee and  ankle pain and stiffness following fall   Next MD Visit [name] and [name] 02/08/14    Prior Therapy no   Observation/Other Assessments   Observations Gait: Lt WNL, Rt excessive knee valgus moment.    Focus on Therapeutic Outcomes (FOTO)  24% limited   AROM   Left Hip External Rotation  50   Left Hip Internal Rotation  26   Right Ankle Dorsiflexion 11   Left Ankle Dorsiflexion 11   Strength   Right Hip Flexion 3+/5   Right Hip Extension 4/5   Right Hip External Rotation  50   Right Hip Internal Rotation  28   Right Hip ABduction 3+/5   Left Hip Flexion 3+/5   Left Hip Extension 4/5   Left Hip ABduction 3+/5   Right Knee Flexion 4/5   Right Knee Extension 4/5   Left Knee Flexion 4/5   Left Knee Extension 4/5   Right Ankle Dorsiflexion 5/5   Left Ankle Dorsiflexion 5/5          [facility] Adult PT Treatment/Exercise - 02/07/14 0001    Knee/Hip Exercises: Standing   Heel Raises 4 sets;10 reps   Heel Raises Limitations toes pointed in   Forward Lunges 10 reps;Both   Functional  Squat 10 reps            PT Short Term Goals - 02/07/14 1148    PT SHORT TERM GOAL #1   Title Pt/caregiver will Perform Home Exercise Program: For increased ROM;For increased strengthening;For improved balance   Time 4   Status Achieved   PT SHORT TERM GOAL #2   Title Patient will be able to perform bilateral straight leg raise to 60 degrees indicatign improved hamstring mobility so patient can more easily bend over to touch toes.     Time 4   Status Achieved   PT SHORT TERM GOAL #3   Title Patient will display an Ely's test of > 110 degrees knee flexion indicating improved quadraceps mobility for ambulating down stairs   Time 4   Status Achieved   PT SHORT TERM GOAL #4   Title patient will demonstrate ankle dorsiflexion AROM of >15 degrees to decrease early heel off during gait   Time 4   Status Partially Met          PT Long Term Goals - 02/07/14 1148    PT LONG TERM GOAL #1   Title Patient will demonstrate increaed hamstring/quad strength of 4/5 MMT to be able to ambulate down stairs   Time 8   Status Achieved   PT LONG TERM GOAL #2   Title Patient will demosntrate gute med strength of 3+/5 to walk wit decreased trendeleberg gait.     Status Achieved   PT LONG TERM GOAL #3   Title Patient will demosntrate a BERG balance score >52 indicating patient not at high risk for falls   Status Achieved   PT LONG TERM GOAL #4   Title patient will be able tto walk >76minutes without pain   Status Partially Met          Plan - 02/07/14 1149    Clinical Impression Statement [name] has met or partially met all of her goals. Patient is unable to arrive to clinic in future due to transportation issues. Patient would benefit from contineud pshycial therapy to conitnue to progress to fully achieving remaining goals throught strengtheing of LE s but due to patient's transportation constraitnts will be unable to conitnue  so she was educated this session on performance of continue  stregtching and to perfom squats, heel raises, and lunges for HEP for strengtheing.    PT Plan Patient discharged due to transportation issues.           G-Codes - 2014-02-14 1138    Functional Assessment Tool Used FOTO 24% limited   Functional Limitation Changing and maintaining body position   Changing and Maintaining Body Position Current Status [phone]) At least 20 percent but less than 40 percent impaired, limited or restricted   Changing and Maintaining Body Position Goal Status ([id number]) At least 20 percent but less than 40 percent impaired, limited or restricted   Changing and Maintaining Body Position Discharge Status ([id number]) At least 20 percent but less than 40 percent impaired, limited or restricted      Problem List Patient Active Problem List   Diagnosis Date Noted  . Stiffness of joint, ankle and foot 01/09/2014  . Stiffness of joint of left pelvic region and thigh 01/09/2014  . Stiffness of joint of right pelvic region and thigh 01/09/2014  . Pain in joint, pelvic region and thigh 01/09/2014  . Muscle weakness (generalized) 01/09/2014  . Pain in joint, ankle and foot 01/09/2014  . Acute on chronic combined systolic and diastolic heart failure 78/93/8101  . Microcytic anemia 10/25/2013  . CKD (chronic kidney disease) stage 3, GFR 30-59 ml/min 10/25/2013  . Pleuritic chest pain 10/24/2013  . S/P partial thyroidectomy -Right 07/28/2011  . GASTROPARESIS 06/02/2010  . PALPITATIONS, OCCASIONAL 04/23/2010  . DIABETES MELLITUS 09/18/2008  . Morbid obesity 09/18/2008  . HYPERTENSION, UNSPECIFIED 09/18/2008  . CARDIOMYOPATHY, SECONDARY 09/18/2008  . SYSTOLIC HEART FAILURE, CHRONIC 09/18/2008   Devona Konig PT DPT [phone]  PHYSICAL THERAPY DISCHARGE SUMMARY  Visits from Start of Care: 17  Current functional level related to goals / functional outcomes: Independent with all ADLs   Remaining deficits: Difficulty with prolonged walking.   Plan: Patient agrees to  discharge.  Patient goals were partially met. Patient is being discharged due to the patient's request.  ?????

## 2014-02-08 ENCOUNTER — Ambulatory Visit (HOSPITAL_COMMUNITY)
Admission: RE | Admit: 2014-02-08 | Discharge: 2014-02-08 | Disposition: A | Discharge status: Home / Self Care | Payer: PRIVATE HEALTH INSURANCE | Source: Ambulatory Visit | Attending: Gastroenterology | Admitting: Gastroenterology

## 2014-02-08 ENCOUNTER — Encounter (HOSPITAL_COMMUNITY): Payer: Self-pay | Admitting: *Deleted

## 2014-02-08 ENCOUNTER — Encounter (HOSPITAL_COMMUNITY)
Admission: RE | Disposition: A | Discharge status: Home / Self Care | Payer: Self-pay | Source: Ambulatory Visit | Attending: Gastroenterology

## 2014-02-08 DIAGNOSIS — K648 Other hemorrhoids: Secondary | ICD-10-CM | POA: Diagnosis not present

## 2014-02-08 DIAGNOSIS — D649 Anemia, unspecified: Secondary | ICD-10-CM | POA: Diagnosis present

## 2014-02-08 DIAGNOSIS — K5730 Diverticulosis of large intestine without perforation or abscess without bleeding: Secondary | ICD-10-CM | POA: Insufficient documentation

## 2014-02-08 DIAGNOSIS — K219 Gastro-esophageal reflux disease without esophagitis: Secondary | ICD-10-CM | POA: Diagnosis not present

## 2014-02-08 DIAGNOSIS — D123 Benign neoplasm of transverse colon: Secondary | ICD-10-CM

## 2014-02-08 DIAGNOSIS — D509 Iron deficiency anemia, unspecified: Secondary | ICD-10-CM

## 2014-02-08 DIAGNOSIS — K2900 Acute gastritis without bleeding: Secondary | ICD-10-CM | POA: Insufficient documentation

## 2014-02-08 DIAGNOSIS — Z7982 Long term (current) use of aspirin: Secondary | ICD-10-CM | POA: Insufficient documentation

## 2014-02-08 DIAGNOSIS — Z79899 Other long term (current) drug therapy: Secondary | ICD-10-CM | POA: Insufficient documentation

## 2014-02-08 DIAGNOSIS — G4730 Sleep apnea, unspecified: Secondary | ICD-10-CM | POA: Insufficient documentation

## 2014-02-08 DIAGNOSIS — E119 Type 2 diabetes mellitus without complications: Secondary | ICD-10-CM | POA: Insufficient documentation

## 2014-02-08 DIAGNOSIS — I10 Essential (primary) hypertension: Secondary | ICD-10-CM | POA: Diagnosis not present

## 2014-02-08 HISTORY — PX: COLONOSCOPY: SHX5424

## 2014-02-08 HISTORY — PX: ESOPHAGOGASTRODUODENOSCOPY: SHX5428

## 2014-02-08 LAB — GLUCOSE, CAPILLARY: Glucose-Capillary: 106 mg/dL — ABNORMAL HIGH (ref 70–99)

## 2014-02-08 SURGERY — COLONOSCOPY
Anesthesia: Moderate Sedation

## 2014-02-08 MED ORDER — MIDAZOLAM HCL 5 MG/5ML IJ SOLN
INTRAMUSCULAR | Status: DC | PRN
  Administered 2014-02-08 (×2): 2 mg via INTRAVENOUS

## 2014-02-08 MED ORDER — LIDOCAINE VISCOUS 2 % MT SOLN
OROMUCOSAL | Status: AC
  Filled 2014-02-08: qty 15

## 2014-02-08 MED ORDER — MEPERIDINE HCL 100 MG/ML IJ SOLN
INTRAMUSCULAR | Status: DC | PRN
Start: 2014-02-08 — End: 2014-02-08
  Administered 2014-02-08: 25 mg

## 2014-02-08 MED ORDER — PROMETHAZINE HCL 25 MG/ML IJ SOLN
INTRAMUSCULAR | Status: AC
  Filled 2014-02-08: qty 1

## 2014-02-08 MED ORDER — FENTANYL CITRATE 0.05 MG/ML IJ SOLN
INTRAMUSCULAR | Status: DC | PRN
  Administered 2014-02-08 (×2): 25 ug via INTRAVENOUS

## 2014-02-08 MED ORDER — SODIUM CHLORIDE 0.9 % IV SOLN
INTRAVENOUS | Status: DC
Start: 2014-02-08 — End: 2014-02-08
  Administered 2014-02-08: 1000 mL via INTRAVENOUS

## 2014-02-08 MED ORDER — FENTANYL CITRATE 0.05 MG/ML IJ SOLN
INTRAMUSCULAR | Status: AC
  Filled 2014-02-08: qty 2

## 2014-02-08 MED ORDER — MEPERIDINE HCL 100 MG/ML IJ SOLN
INTRAMUSCULAR | Status: AC
  Filled 2014-02-08: qty 2

## 2014-02-08 MED ORDER — MIDAZOLAM HCL 5 MG/5ML IJ SOLN
INTRAMUSCULAR | Status: AC
  Filled 2014-02-08: qty 10

## 2014-02-08 MED ORDER — STERILE WATER FOR IRRIGATION IR SOLN
Status: DC | PRN
  Administered 2014-02-08: 09:00:00

## 2014-02-08 MED ORDER — PROMETHAZINE HCL 25 MG/ML IJ SOLN
INTRAMUSCULAR | Status: DC | PRN
Start: 2014-02-08 — End: 2014-02-08
  Administered 2014-02-08: 6.25 mg via INTRAVENOUS

## 2014-02-08 MED ORDER — SODIUM CHLORIDE 0.9 % IJ SOLN
INTRAMUSCULAR | Status: AC
  Filled 2014-02-08: qty 10

## 2014-02-08 NOTE — H&P (Addendum)
Primary Care Physician:  Maggie Font, MD Primary Gastroenterologist:  Dr. Oneida Alar  Pre-Procedure History & Physical: HPI:  [name] is a 68 y.o. female here for  ANEMIA.  Past Medical History  Diagnosis Date  . Systolic heart failure     Chronic  . Overweight(278.02)   . Cardiomyopathy secondary   . Hypertension     takes Diovan and Spirololactone daily  . Dysrhythmia     pt takes Digoxin and Carvedilol daily  . CHF (congestive heart failure)     takes Lasix prn  . Sleep apnea     doesn't use CPAP  . Pneumonia     hx of;many yrs ago  . Peripheral neuropathy   . Arthritis     ankles  . Gout     hx of x 1 time in Aug 2012  . GERD (gastroesophageal reflux disease)     takes Protonix seldom  . H/O hiatal hernia   . Hemorrhoid   . Anemia     takes Ferrous Fumarate 2 times week  . Blood transfusion     hx of-18yrs ago  . Thyroid nodule     right  . Diabetes mellitus     takes Glipizide and Metformin daily  . Cataract     bilateral and immature    Past Surgical History  Procedure Laterality Date  . Cardiac defirillator removed      removed <19months;removed d/t shocking pt 15times;pierced sac of heart 1000cc ;drained  . Stomach stapling surgery  80's  . Appendectomy  1991  . Tummy tuck  1991  . Leg lipoma    . Abdominal hysterectomy  1991  . Tubal ligation  1971  . Ovarian cyst removed  1991  . Cholecystectomy open  1992  . Colonoscopy    . Thyroid lobectomy  07/27/2011    Procedure: THYROID LOBECTOMY;  Surgeon: Gayland Curry, MD,FACS;  Location: [facility];  Service: General;  Laterality: Right;  . Cardiac defibrillator placement  >62yrs ago  . Defibrillator removed      Prior to Admission medications   Medication Sig Start Date End Date Taking? Authorizing Provider  aspirin EC 81 MG tablet Take 81 mg by mouth at bedtime.   Yes Historical Provider, MD  atorvastatin (LIPITOR) 10 MG tablet Take 10 mg by mouth daily at 6 PM.  01/09/13  Yes Historical  Provider, MD  carvedilol (COREG) 25 MG tablet Take 1 tablet (25 mg total) by mouth 2 (two) times daily. 08/06/13 04/17/15 Yes Arnoldo Lenis, MD  CINNAMON PO Take 2 capsules by mouth every morning.    Yes Historical Provider, MD  ferrous fumarate (HEMOCYTE - 106 MG FE) 325 (106 FE) MG TABS tablet Take 1 tablet (106 mg of iron total) by mouth 2 (two) times daily. 10/27/13  Yes Barton Dubois, MD  Flaxseed, Linseed, (FLAX SEED OIL PO) Take 1 capsule by mouth every morning.    Yes Historical Provider, MD  furosemide (LASIX) 40 MG tablet Take 1 tablet (40 mg total) by mouth daily. For fluid retention 10/27/13  Yes Barton Dubois, MD  glipiZIDE (GLUCOTROL) 5 MG tablet Take 5 mg by mouth every evening.  11/23/12  Yes Historical Provider, MD  HYDROcodone-acetaminophen (NORCO) 5-325 MG per tablet Take 1-2 tablets by mouth every 4 (four) hours as needed. 12/16/13  Yes Nat Christen, MD  levothyroxine (SYNTHROID, LEVOTHROID) 100 MCG tablet Take 100 mcg by mouth daily before breakfast.   Yes Historical Provider, MD  spironolactone (ALDACTONE)  25 MG tablet Take 12.5 mg by mouth daily.   Yes Historical Provider, MD  tetrahydrozoline (EYE DROPS) 0.05 % ophthalmic solution Place 1 drop into both eyes daily as needed. For dry eye relief   Yes Historical Provider, MD  TRADJENTA 5 MG TABS tablet Take 5 mg by mouth daily.  11/21/13  Yes Historical Provider, MD  valsartan (DIOVAN) 40 MG tablet Take 20 mg by mouth at bedtime. 10/27/13  Yes Barton Dubois, MD    Allergies as of 01/11/2014 - Review Complete 12/18/2013  Allergen Reaction Noted  . Doxycycline Rash 06/12/2012    Family History  Problem Relation Age of Onset  . Anesthesia problems Neg Hx   . Hypotension Neg Hx   . Malignant hyperthermia Neg Hx   . Pseudochol deficiency Neg Hx     History   Social History  . Marital Status: Widowed    Spouse Name: N/A    Number of Children: N/A  . Years of Education: N/A   Occupational History  . Not on file.   Social  History Main Topics  . Smoking status: Never Smoker   . Smokeless tobacco: Not on file  . Alcohol Use: Yes     Comment: glass wine occasionally  . Drug Use: No  . Sexual Activity: Yes    Birth Control/ Protection: Surgical   Other Topics Concern  . Not on file   Social History Narrative    Review of Systems: See HPI, otherwise negative ROS   Physical Exam: BP 116/79 mmHg  Pulse 69  Temp(Src) 97.3 F (36.3 C) (Oral)  Resp 19  SpO2 100% General:   Alert,  pleasant and cooperative in NAD Head:  Normocephalic and atraumatic. Neck:  Supple; Lungs:  Clear throughout to auscultation.    Heart:  Regular rate and rhythm. Abdomen:  Soft, nontender and nondistended. Normal bowel sounds, without guarding, and without rebound.   Neurologic:  Alert and  oriented x4;  grossly normal neurologically.  Impression/Plan:   Anemia  PLAN:  1. TCS/?EGD TODAY

## 2014-02-08 NOTE — Progress Notes (Signed)
After giving first dose demerol and versed patient complained of nausea, Dr. Oneida Alar notified 6.25 phenergan ordered and given. Patient stated she felt much better.  Dr. Oneida Alar also changed order from 25 mg demerol to 25 mcg of fentanyl patient responds to voice and was comfortable for procedure.

## 2014-02-08 NOTE — Discharge Instructions (Signed)
NO obvious SOURCE FOR YOUR LOW BLOOD COUNT WAS IDENTIFIED. YOU had one small polyp removed. 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 I biopsied your stomach AND SMALL BOWEL.    Stop iron pills. Your iron stores are normal.  FOLLOW A HIGH FIBER/LOW FAT/DIABETIC DIET. AVOID ITEMS THAT CAUSE BLOATING. SEE INFO BELOW.  YOUR BIOPSY WILL BE BACK IN 14 DAYS.  FOLLOW UP IN 4 MOS.  NEXT COLONOSCOPY IN 10 YEARS if the benefits outweigh the risks.   ENDOSCOPY Care After Read the instructions outlined below and refer to this sheet in the next week. These discharge instructions provide you with general information on caring for yourself after you leave the hospital. While your treatment has been planned according to the most current medical practices available, unavoidable complications occasionally occur. If you have any problems or questions after discharge, call DR. Askia Hazelip, [phone].  ACTIVITY  You may resume your regular activity, but move at a slower pace for the next 24 hours.   Take frequent rest periods for the next 24 hours.   Walking will help get rid of the air and reduce the bloated feeling in your belly (abdomen).   No driving for 24 hours (because of the medicine (anesthesia) used during the test).   You may shower.   Do not sign any important legal documents or operate any machinery for 24 hours (because of the anesthesia used during the test).    NUTRITION  Drink plenty of fluids.   You may resume your normal diet as instructed by your doctor.   Begin with a light meal and progress to your normal diet. Heavy or fried foods are harder to digest and may make you feel sick to your stomach (nauseated).   Avoid alcoholic beverages for 24 hours or as instructed.    MEDICATIONS  You may resume your normal medications.   WHAT YOU CAN EXPECT TODAY  Some  feelings of bloating in the abdomen.   Passage of more gas than usual.   Spotting of blood in your stool or on the toilet paper  .  IF YOU HAD POLYPS REMOVED DURING THE ENDOSCOPY:  Eat a soft diet IF YOU HAVE NAUSEA, BLOATING, ABDOMINAL PAIN, OR VOMITING.    FINDING OUT THE RESULTS OF YOUR TEST Not all test results are available during your visit. DR. Oneida Alar WILL CALL YOU WITHIN 14 DAYS OF YOUR PROCEDUE WITH YOUR RESULTS. Do not assume everything is normal if you have not heard from DR. Tarryn Bogdan IN 14 days, CALL HER OFFICE AT [phone].  SEEK IMMEDIATE MEDICAL ATTENTION AND CALL THE OFFICE: [phone] IF:  You have more than a spotting of blood in your stool.   Your belly is swollen (abdominal distention).   You are nauseated or vomiting.   You have a temperature over 101F.   You have abdominal pain or discomfort that is severe or gets worse throughout the day.   Gastritis  Gastritis is an inflammation (the body's way of reacting to injury and/or infection) of the stomach. It is often caused by viral or bacterial (germ) infections. It can also be caused BY ASPIRIN, BC/GOODY POWDER'S, (IBUPROFEN) MOTRIN, OR ALEVE (NAPROXEN), chemicals (including alcohol), SPICY FOODS, and medications. This illness may be associated with generalized malaise (feeling tired, not well), UPPER ABDOMINAL STOMACH cramps, and fever. One common bacterial cause of gastritis is an organism known as H. Pylori. This can  be treated with antibiotics.    High-Fiber Diet A high-fiber diet changes your normal diet to include more whole grains, legumes, fruits, and vegetables. Changes in the diet involve replacing refined carbohydrates with unrefined foods. The calorie level of the diet is essentially unchanged. The Dietary Reference Intake (recommended amount) for adult males is 38 grams per day. For adult females, it is 25 grams per day. Pregnant and lactating women should consume 28 grams of fiber per day. Fiber  is the intact part of a plant that is not broken down during digestion. Functional fiber is fiber that has been isolated from the plant to provide a beneficial effect in the body. PURPOSE  Increase stool bulk.   Ease and regulate bowel movements.   Lower cholesterol.  INDICATIONS THAT YOU NEED MORE FIBER  Constipation and hemorrhoids.   Uncomplicated diverticulosis (intestine condition) and irritable bowel syndrome.   Weight management.   As a protective measure against hardening of the arteries (atherosclerosis), diabetes, and cancer.   GUIDELINES FOR INCREASING FIBER IN THE DIET  Start adding fiber to the diet slowly. A gradual increase of about 5 more grams (2 slices of whole-wheat bread, 2 servings of most fruits or vegetables, or 1 bowl of high-fiber cereal) per day is best. Too rapid an increase in fiber may result in constipation, flatulence, and bloating.   Drink enough water and fluids to keep your urine clear or pale yellow. Water, juice, or caffeine-free drinks are recommended. Not drinking enough fluid may cause constipation.   Eat a variety of high-fiber foods rather than one type of fiber.   Try to increase your intake of fiber through using high-fiber foods rather than fiber pills or supplements that contain small amounts of fiber.   The goal is to change the types of food eaten. Do not supplement your present diet with high-fiber foods, but replace foods in your present diet.  INCLUDE A VARIETY OF FIBER SOURCES  Replace refined and processed grains with whole grains, canned fruits with fresh fruits, and incorporate other fiber sources. White rice, white breads, and most bakery goods contain little or no fiber.   Brown whole-grain rice, buckwheat oats, and many fruits and vegetables are all good sources of fiber. These include: broccoli, Brussels sprouts, cabbage, cauliflower, beets, sweet potatoes, white potatoes (skin on), carrots, tomatoes, eggplant, squash,  berries, fresh fruits, and dried fruits.   Cereals appear to be the richest source of fiber. Cereal fiber is found in whole grains and bran. Bran is the fiber-rich outer coat of cereal grain, which is largely removed in refining. In whole-grain cereals, the bran remains. In breakfast cereals, the largest amount of fiber is found in those with "bran" in their names. The fiber content is sometimes indicated on the label.   You may need to include additional fruits and vegetables each day.   In baking, for 1 cup white flour, you may use the following substitutions:   1 cup whole-wheat flour minus 2 tablespoons.   1/2 cup white flour plus 1/2 cup whole-wheat flour.    Low-Fat Diet BREADS, CEREALS, PASTA, RICE, DRIED PEAS, AND BEANS These products are high in carbohydrates and most are low in fat. Therefore, they can be increased in the diet as substitutes for fatty foods. They too, however, contain calories and should not be eaten in excess. Cereals can be eaten for snacks as well as for breakfast.   FRUITS AND VEGETABLES It is good to eat fruits and vegetables. Besides being  sources of fiber, both are rich in vitamins and some minerals. They help you get the daily allowances of these nutrients. Fruits and vegetables can be used for snacks and desserts.  MEATS Limit lean meat, chicken, [place], and fish to no more than 6 ounces per day. Beef, Pork, and Lamb Use lean cuts of beef, pork, and lamb. Lean cuts include:  Extra-lean ground beef.  Arm roast.  Sirloin tip.  Center-cut ham.  Round steak.  Loin chops.  Rump roast.  Tenderloin.  Trim all fat off the outside of meats before cooking. It is not necessary to severely decrease the intake of red meat, but lean choices should be made. Lean meat is rich in protein and contains a highly absorbable form of iron. Premenopausal women, in particular, should avoid reducing lean red meat because this could increase the risk for low red blood cells  (iron-deficiency anemia).  Chicken and [place] These are good sources of protein. The fat of poultry can be reduced by removing the skin and underlying fat layers before cooking. Chicken and [place] can be substituted for lean red meat in the diet. Poultry should not be fried or covered with high-fat sauces. Fish and Shellfish Fish is a good source of protein. Shellfish contain cholesterol, but they usually are low in saturated fatty acids. The preparation of fish is important. Like chicken and [place], they should not be fried or covered with high-fat sauces. EGGS Egg whites contain no fat or cholesterol. They can be eaten often. Try 1 to 2 egg whites instead of whole eggs in recipes or use egg substitutes that do not contain yolk. MILK AND DAIRY PRODUCTS Use skim or 1% milk instead of 2% or whole milk. Decrease whole milk, natural, and processed cheeses. Use nonfat or low-fat (2%) cottage cheese or low-fat cheeses made from vegetable oils. Choose nonfat or low-fat (1 to 2%) yogurt. Experiment with evaporated skim milk in recipes that call for heavy cream. Substitute low-fat yogurt or low-fat cottage cheese for sour cream in dips and salad dressings. Have at least 2 servings of low-fat dairy products, such as 2 glasses of skim (or 1%) milk each day to help get your daily calcium intake. FATS AND OILS Reduce the total intake of fats, especially saturated fat. Butterfat, lard, and beef fats are high in saturated fat and cholesterol. These should be avoided as much as possible. Vegetable fats do not contain cholesterol, but certain vegetable fats, such as coconut oil, palm oil, and palm kernel oil are very high in saturated fats. These should be limited. These fats are often used in bakery goods, processed foods, popcorn, oils, and nondairy creamers. Vegetable shortenings and some peanut butters contain hydrogenated oils, which are also saturated fats. Read the labels on these foods and check for saturated  vegetable oils. Unsaturated vegetable oils and fats do not raise blood cholesterol. However, they should be limited because they are fats and are high in calories. Total fat should still be limited to 30% of your daily caloric intake. Desirable liquid vegetable oils are corn oil, cottonseed oil, olive oil, canola oil, safflower oil, soybean oil, and sunflower oil. Peanut oil is not as good, but small amounts are acceptable. Buy a heart-healthy tub margarine that has no partially hydrogenated oils in the ingredients. Mayonnaise and salad dressings often are made from unsaturated fats, but they should also be limited because of their high calorie and fat content. Seeds, nuts, peanut butter, olives, and avocados are high in fat, but  the fat is mainly the unsaturated type. These foods should be limited mainly to avoid excess calories and fat. OTHER EATING TIPS Snacks  Most sweets should be limited as snacks. They tend to be rich in calories and fats, and their caloric content outweighs their nutritional value. Some good choices in snacks are graham crackers, melba toast, soda crackers, bagels (no egg), English muffins, fruits, and vegetables. These snacks are preferable to snack crackers, [place] fries, TORTILLA CHIPS, and POTATO chips. Popcorn should be air-popped or cooked in small amounts of liquid vegetable oil. Desserts Eat fruit, low-fat yogurt, and fruit ices instead of pastries, cake, and cookies. Sherbet, angel food cake, gelatin dessert, frozen low-fat yogurt, or other frozen products that do not contain saturated fat (pure fruit juice bars, frozen ice pops) are also acceptable.  COOKING METHODS Choose those methods that use little or no fat. They include: Poaching.  Braising.  Steaming.  Grilling.  Baking.  Stir-frying.  Broiling.  Microwaving.  Foods can be cooked in a nonstick pan without added fat, or use a nonfat cooking spray in regular cookware. Limit fried foods and avoid frying in  saturated fat. Add moisture to lean meats by using water, broth, cooking wines, and other nonfat or low-fat sauces along with the cooking methods mentioned above. Soups and stews should be chilled after cooking. The fat that forms on top after a few hours in the refrigerator should be skimmed off. When preparing meals, avoid using excess salt. Salt can contribute to raising blood pressure in some people.  EATING AWAY FROM HOME Order entres, potatoes, and vegetables without sauces or butter. When meat exceeds the size of a deck of cards (3 to 4 ounces), the rest can be taken home for another meal. Choose vegetable or fruit salads and ask for low-calorie salad dressings to be served on the side. Use dressings sparingly. Limit high-fat toppings, such as bacon, crumbled eggs, cheese, sunflower seeds, and olives. Ask for heart-healthy tub margarine instead of butter.  Hemorrhoids Hemorrhoids are dilated (enlarged) veins around the rectum. Sometimes clots will form in the veins. This makes them swollen and painful. These are called thrombosed hemorrhoids. Causes of hemorrhoids include:  Constipation.   Straining to have a bowel movement.   HEAVY LIFTING HOME CARE INSTRUCTIONS  Eat a well balanced diet and drink 6 to 8 glasses of water every day to avoid constipation. You may also use a bulk laxative.   Avoid straining to have bowel movements.   Keep anal area dry and clean.   Do not use a donut shaped pillow or sit on the toilet for long periods. This increases blood pooling and pain.   Move your bowels when your body has the urge; this will require less straining and will decrease pain and pressure.

## 2014-02-09 NOTE — Op Note (Signed)
[facility] [address] [place], [address]   ENDOSCOPY PROCEDURE REPORT  PATIENT: [name], [name]  MR#: [id number] BIRTHDATE: 10/08/1945 , 61  yrs. old GENDER: female  ENDOSCOPIST: Barney Drain, MD REFERRED [id number]:[id number] Hill, M.D. PROCEDURE DATE: 02/08/2014 PROCEDURE:   EGD w/ biopsy INDICATIONS:anemia.-MICROCYTIC(AUG 2015) WITH FERRITIN 152 IN SEP 2015. PT OCCASIONAL FEEL SLIKE FOOD STOP IN UPPER ABDOMEN. MEDICATIONS: TCS+ Fentanyl 25 mcg IV and Versed 1 mg IV TOPICAL ANESTHETIC:   Viscous Xylocaine ASA CLASS:  DESCRIPTION OF PROCEDURE:     Physical exam was performed.  Informed consent was obtained from the patient after explaining the benefits, risks, and alternatives to the procedure.  The patient was connected to the monitor and placed in the left lateral position.  Continuous oxygen was provided by nasal cannula and IV medicine administered through an indwelling cannula.  After administration of sedation, the patients esophagus was intubated and the [id number])  endoscope was advanced under direct visualization to the second portion of the duodenum.  The scope was removed slowly by carefully examining the color, texture, anatomy, and integrity of the mucosa on the way out.  The patient was recovered in endoscopy and discharged home in satisfactory condition.   ESOPHAGUS: The mucosa of the esophagus appeared normal.   STOMACH: BLACK FOREIGN BODY IN CARDIA WHICH APPEARED ATTACHED TO GASTRIC MUCOSA.  COLD BIOPSIES TAKEN OF LESION AND UNDERLYING MUCOSA. Mild erosive gastritis (inflammation) was found in the gastric antrum.  Multiple biopsies were performed using cold forceps. DUODENUM: The duodenal mucosa showed no abnormalities in the bulb and 2nd part of the duodenum.  Cold forceps biopsies were taken in the bulb and second portion.        COMPLICATIONS: There were no immediate complications.  ENDOSCOPIC IMPRESSION: 1.   NO SOURCE FOR  ANEMIA IDENTIFIED 2.   BLACK FOREIGN BODY IN CARDIA 3.   MILD Erosive gastritis (inflammation) was found in the gastric antrum; multiple biopsies were performed  RECOMMENDATIONS: Stop iron pills. FOLLOW A HIGH FIBER/LOW FAT/DIABETIC DIET.  AVOID ITEMS THAT CAUSE BLOATING. BIOPSY WILL BE BACK IN 14 DAYS. FOLLOW UP IN 4 MOS. CONSIDER GIVENS CAPSULE STUDY IF PT UNABLE TO MAINATIN FERRITIN AND Hb NEXT COLONOSCOPY IN 10 YEARS if the benefits outweigh the risks.  REPEAT EXAM: _______________________________ eSignedBarney Drain, MD Feb 25, 2014 5:10 AM     CPT CODES: ICD CODES:  The ICD and CPT codes recommended by this software are interpretations from the data that the clinical staff has captured with the software.  The verification of the translation of this report to the ICD and CPT codes and modifiers is the sole responsibility of the health care institution and practicing physician where this report was generated.  [place]. will not be held responsible for the validity of the ICD and CPT codes included on this report.  AMA assumes no liability for data contained or not contained herein. CPT is a Designer, television/film set of the [employer].

## 2014-02-09 NOTE — Op Note (Signed)
[facility] [address] [place], [address]   COLONOSCOPY PROCEDURE REPORT  PATIENT: Victoria, [name]  MR#: [id number] BIRTHDATE: December 31, 1945 , 46  yrs. old GENDER: female ENDOSCOPIST: Barney Drain, MD REFERRED [id number] Hill, M.D. PROCEDURE DATE:  02/08/2014 PROCEDURE:   Colonoscopy with cold biopsy polypectomy INDICATIONS:anemia, non-specific. -Ferritin 152 in SEP 2015. LAST TCS 2011-NO POLYPS MEDICATIONS: Demerol 25 mg IV, Fentanyl 25 mcg IV, Promethazine (Phenergan) 12.5 mg IV, and Versed 4 mg IV  DESCRIPTION OF PROCEDURE:    Physical exam was performed.  Informed consent was obtained from the patient after explaining the benefits, risks, and alternatives to procedure.  The patient was connected to monitor and placed in left lateral position. Continuous oxygen was provided by nasal cannula and IV medicine administered through an indwelling cannula.  After administration of sedation and rectal exam, the patients rectum was intubated and the [id number]  colonoscope was advanced under direct visualization to the ileum.  The scope was removed slowly by carefully examining the color, texture, anatomy, and integrity mucosa on the way out.  The patient was recovered in endoscopy and discharged home in satisfactory condition.    COLON FINDINGS: The examined terminal ileum appeared to be normal. , A single polyp measuring 3 mm in size was found in the transverse colon.  A polypectomy was performed with cold forceps.  , There was moderate diverticulosis noted in the sigmoid colon and descending colon with associated angulation and tortuosity.  , and Moderate sized internal hemorrhoids were found.  PREP QUALITY: good CECAL W/D TIME: 19 mins  COMPLICATIONS: VOMITING AFTER DEMEROL. PHENERGAN 6.25 MG IV GIVEN AND CHANGED TO FENTANYL.  ENDOSCOPIC IMPRESSION: 1.   NO SOURCE FOR ANEMIA IDENTIFIEDl 2.   Single polyp REMOVED 3.   Moderate diverticulosis  in the sigmoid colon and descending colon 4.   Moderate sized internal hemorrhoids  RECOMMENDATIONS: Stop iron pills. FOLLOW A HIGH FIBER/LOW FAT/DIABETIC DIET.  AVOID ITEMS THAT CAUSE BLOATING. YOUR BIOPSY WILL BE BACK IN 14 DAYS. FOLLOW UP IN 4 MOS. NEXT COLONOSCOPY IN 10 YEARS if the benefits outweigh the risks.    _______________________________ eSignedBarney Drain, MD 02-25-2014 4:45 AM   CPT CODES: ICD CODES:  The ICD and CPT codes recommended by this software are interpretations from the data that the clinical staff has captured with the software.  The verification of the translation of this report to the ICD and CPT codes and modifiers is the sole responsibility of the health care institution and practicing physician where this report was generated.  [facility]. will not be held responsible for the validity of the ICD and CPT codes included on this report.  AMA assumes no liability for data contained or not contained herein. CPT is a Designer, television/film set of the [employer].

## 2014-02-12 ENCOUNTER — Ambulatory Visit (HOSPITAL_COMMUNITY): Payer: PRIVATE HEALTH INSURANCE | Admitting: Physical Therapy

## 2014-02-12 ENCOUNTER — Encounter (HOSPITAL_COMMUNITY): Payer: Self-pay | Admitting: Gastroenterology

## 2014-02-20 ENCOUNTER — Ambulatory Visit (HOSPITAL_COMMUNITY)
Admission: RE | Admit: 2014-02-20 | Discharge: 2014-02-20 | Disposition: A | Discharge status: Home / Self Care | Payer: PRIVATE HEALTH INSURANCE | Source: Ambulatory Visit | Attending: Nephrology | Admitting: Nephrology

## 2014-02-20 DIAGNOSIS — N183 Chronic kidney disease, stage 3 unspecified: Secondary | ICD-10-CM

## 2014-02-21 ENCOUNTER — Ambulatory Visit (HOSPITAL_COMMUNITY): Payer: PRIVATE HEALTH INSURANCE

## 2014-03-20 ENCOUNTER — Telehealth: Payer: Self-pay | Admitting: Gastroenterology

## 2014-03-20 ENCOUNTER — Other Ambulatory Visit: Payer: Self-pay

## 2014-03-20 DIAGNOSIS — D649 Anemia, unspecified: Secondary | ICD-10-CM

## 2014-03-20 NOTE — Telephone Encounter (Signed)
Pt is aware of the results. She is aware that she will need labs prior to OV in 4 months. Lab orders on file.  Pt said she is confused now about stopping the iron. She went to the kidney doctor and he did a bunch of labs and told her that she should not be off of the iron. She started back, and is afraid to take everyday since Dr. Oneida Alar told her to HOLD the iron.  Please advise!

## 2014-03-20 NOTE — Telephone Encounter (Signed)
Pt is aware. I made a notation on the labs for the end of March instead of the end of April.

## 2014-03-20 NOTE — Telephone Encounter (Signed)
Please call pt. She had a simple adenoma removed. HER stomach Bx shows mild gastritis.  HER SMALL BOWEL BIOPSIES ARE NORMAL.   HOLD IRON PILLS.  FOLLOW A HIGH FIBER/LOW FAT/DIABETIC DIET. AVOID ITEMS THAT CAUSE BLOATING.  FOLLOW UP IN 4 MOS E30 DYSPHAGIA/ANEMIA. CBC/FERRITIN 1 WEEK PRIOR TO HER OPV.  NEXT COLONOSCOPY IN 10 YEARS if the benefits outweigh the risks.

## 2014-03-20 NOTE — Telephone Encounter (Signed)
On recall list 

## 2014-03-20 NOTE — Telephone Encounter (Signed)
PLEASE CALL PT. WE WILL SEND KIDNEY DOCTOR A NOTE. SHE MAY HOLD IRON FOR 3 MOS AND RECHECK HER IRON STORES/CBC IN 3 MOS INSTEAD OF FOUR.[name]

## 2014-03-21 NOTE — Telephone Encounter (Signed)
Reminder in epic °

## 2014-03-27 ENCOUNTER — Telehealth: Payer: Self-pay | Admitting: Gastroenterology

## 2014-03-27 DIAGNOSIS — R809 Proteinuria, unspecified: Secondary | ICD-10-CM | POA: Insufficient documentation

## 2014-03-27 DIAGNOSIS — D472 Monoclonal gammopathy: Secondary | ICD-10-CM | POA: Diagnosis not present

## 2014-03-27 DIAGNOSIS — N289 Disorder of kidney and ureter, unspecified: Secondary | ICD-10-CM | POA: Insufficient documentation

## 2014-03-27 NOTE — Telephone Encounter (Signed)
I faxed the note from [facility] to Dr Rhona Leavens office on Monday 03/25/2014

## 2014-03-27 NOTE — Telephone Encounter (Signed)
Thank you :)

## 2014-03-29 ENCOUNTER — Other Ambulatory Visit (HOSPITAL_COMMUNITY): Payer: Self-pay | Admitting: Oncology

## 2014-03-29 DIAGNOSIS — R809 Proteinuria, unspecified: Secondary | ICD-10-CM

## 2014-03-29 DIAGNOSIS — D472 Monoclonal gammopathy: Secondary | ICD-10-CM

## 2014-04-10 ENCOUNTER — Other Ambulatory Visit: Payer: Self-pay | Admitting: Radiology

## 2014-04-11 ENCOUNTER — Ambulatory Visit (HOSPITAL_COMMUNITY)
Admission: RE | Admit: 2014-04-11 | Discharge: 2014-04-11 | Disposition: A | Discharge status: Home / Self Care | Payer: Medicare Other | Source: Ambulatory Visit | Attending: Interventional Radiology | Admitting: Interventional Radiology

## 2014-04-11 ENCOUNTER — Encounter (HOSPITAL_COMMUNITY): Payer: Self-pay

## 2014-04-11 ENCOUNTER — Ambulatory Visit (HOSPITAL_COMMUNITY)
Admission: RE | Admit: 2014-04-11 | Discharge: 2014-04-11 | Disposition: A | Discharge status: Home / Self Care | Payer: Medicare Other | Source: Ambulatory Visit | Attending: Oncology | Admitting: Oncology

## 2014-04-11 ENCOUNTER — Telehealth (HOSPITAL_COMMUNITY): Payer: Self-pay | Admitting: Neurology

## 2014-04-11 DIAGNOSIS — C9000 Multiple myeloma not having achieved remission: Secondary | ICD-10-CM | POA: Diagnosis not present

## 2014-04-11 DIAGNOSIS — D61818 Other pancytopenia: Secondary | ICD-10-CM | POA: Diagnosis not present

## 2014-04-11 DIAGNOSIS — D472 Monoclonal gammopathy: Secondary | ICD-10-CM | POA: Insufficient documentation

## 2014-04-11 DIAGNOSIS — R809 Proteinuria, unspecified: Secondary | ICD-10-CM | POA: Diagnosis not present

## 2014-04-11 LAB — CBC
HCT: 27.8 % — ABNORMAL LOW (ref 36.0–46.0)
HEMOGLOBIN: 8.6 g/dL — AB (ref 12.0–15.0)
MCH: 25.4 pg — AB (ref 26.0–34.0)
MCHC: 30.9 g/dL (ref 30.0–36.0)
MCV: 82.2 fL (ref 78.0–100.0)
Platelets: 148 10*3/uL — ABNORMAL LOW (ref 150–400)
RBC: 3.38 MIL/uL — ABNORMAL LOW (ref 3.87–5.11)
RDW: 16.8 % — ABNORMAL HIGH (ref 11.5–15.5)
WBC: 4 10*3/uL (ref 4.0–10.5)

## 2014-04-11 LAB — GLUCOSE, CAPILLARY: Glucose-Capillary: 106 mg/dL — ABNORMAL HIGH (ref 70–99)

## 2014-04-11 LAB — PROTIME-INR
INR: 1.34 (ref 0.00–1.49)
Prothrombin Time: 16.8 seconds — ABNORMAL HIGH (ref 11.6–15.2)

## 2014-04-11 LAB — APTT: aPTT: 29 seconds (ref 24–37)

## 2014-04-11 LAB — BONE MARROW EXAM

## 2014-04-11 MED ORDER — FENTANYL CITRATE 0.05 MG/ML IJ SOLN
INTRAMUSCULAR | Status: AC
  Filled 2014-04-11: qty 6

## 2014-04-11 MED ORDER — FENTANYL CITRATE 0.05 MG/ML IJ SOLN
INTRAMUSCULAR | Status: AC | PRN
  Administered 2014-04-11 (×2): 25 ug via INTRAVENOUS
  Administered 2014-04-11: 50 ug via INTRAVENOUS

## 2014-04-11 MED ORDER — SODIUM CHLORIDE 0.9 % IV SOLN
INTRAVENOUS | Status: DC
  Administered 2014-04-11: 10:00:00 via INTRAVENOUS

## 2014-04-11 MED ORDER — MIDAZOLAM HCL 2 MG/2ML IJ SOLN
INTRAMUSCULAR | Status: AC
  Filled 2014-04-11: qty 6

## 2014-04-11 MED ORDER — MIDAZOLAM HCL 2 MG/2ML IJ SOLN
INTRAMUSCULAR | Status: AC | PRN
  Administered 2014-04-11: 0.5 mg via INTRAVENOUS
  Administered 2014-04-11: 1 mg via INTRAVENOUS
  Administered 2014-04-11: 0.5 mg via INTRAVENOUS

## 2014-04-11 NOTE — Discharge Instructions (Signed)
Leave bandage on today and remove tomorrow and shower. Call MD for fever or redness,drainage, or swelling at biopsy site.     Conscious Sedation Sedation is the use of medicines to promote relaxation and relieve discomfort and anxiety. Conscious sedation is a type of sedation. Under conscious sedation you are less alert than normal but are still able to respond to instructions or stimulation. Conscious sedation is used during short medical and dental procedures. It is milder than deep sedation or general anesthesia and allows you to return to your regular activities sooner.  LET [facility] CARE PROVIDER KNOW ABOUT:   Any allergies you have.  All medicines you are taking, including vitamins, herbs, eye drops, creams, and over-the-counter medicines.  Use of steroids (by mouth or creams).  Previous problems you or members of your family have had with the use of anesthetics.  Any blood disorders you have.  Previous surgeries you have had.  Medical conditions you have.  Possibility of pregnancy, if this applies.  Use of cigarettes, alcohol, or illegal drugs. RISKS AND COMPLICATIONS Generally, this is a safe procedure. However, as with any procedure, problems can occur. Possible problems include:  Oversedation.  Trouble breathing on your own. You may need to have a breathing tube until you are awake and breathing on your own.  Allergic reaction to any of the medicines used for the procedure. BEFORE THE PROCEDURE  You may have blood tests done. These tests can help show how well your kidneys and liver are working. They can also show how well your blood clots.  A physical exam will be done.  Only take medicines as directed by your health care provider. You may need to stop taking medicines (such as blood thinners, aspirin, or nonsteroidal anti-inflammatory drugs) before the procedure.   Do not eat or drink at least 6 hours before the procedure or as directed by your health care  provider.  Arrange for a responsible adult, family member, or friend to take you home after the procedure. He or she should stay with you for at least 24 hours after the procedure, until the medicine has worn off. PROCEDURE   An intravenous (IV) catheter will be inserted into one of your veins. Medicine will be able to flow directly into your body through this catheter. You may be given medicine through this tube to help prevent pain and help you relax.  The medical or dental procedure will be done. AFTER THE PROCEDURE  You will stay in a recovery area until the medicine has worn off. Your blood pressure and pulse will be checked.   Depending on the procedure you had, you may be allowed to go home when you can tolerate liquids and your pain is under control. Document Released: 12/01/2000 Document Revised: 03/13/2013 Document Reviewed: 11/13/2012 [facility] Patient Information 2015 [place], [place]. This information is not intended to replace advice given to you by your health care provider. Make sure you discuss any questions you have with your health care provider.    Bone Marrow Aspiration, Bone Marrow Biopsy Care After Read the instructions outlined below and refer to this sheet in the next few weeks. These discharge instructions provide you with general information on caring for yourself after you leave the hospital. Your caregiver may also give you specific instructions. While your treatment has been planned according to the most current medical practices available, unavoidable complications occasionally occur. If you have any problems or questions after discharge, call your caregiver. FINDING OUT THE RESULTS  OF YOUR TEST Not all test results are available during your visit. If your test results are not back during the visit, make an appointment with your caregiver to find out the results. Do not assume everything is normal if you have not heard from your caregiver or the medical facility.  It is important for you to follow up on all of your test results.  HOME CARE INSTRUCTIONS  You have had sedation and may be sleepy or dizzy. Your thinking may not be as clear as usual. For the next 24 hours:  Only take over-the-counter or prescription medicines for pain, discomfort, and or fever as directed by your caregiver.  Do not drink alcohol.  Do not smoke.  Do not drive.  Do not make important legal decisions.  Do not operate heavy machinery.  Do not care for small children by yourself.  Keep your dressing clean and dry. You may replace dressing with a bandage after 24 hours.  You may take a bath or shower after 24 hours.  Use an ice pack for 20 minutes every 2 hours while awake for pain as needed. SEEK MEDICAL CARE IF:   There is redness, swelling, or increasing pain at the biopsy site.  There is pus coming from the biopsy site.  There is drainage from a biopsy site lasting longer than one day.  An unexplained oral temperature above 102 F (38.9 C) develops. SEEK IMMEDIATE MEDICAL CARE IF:   You develop a rash.  You have difficulty breathing.  You develop any reaction or side effects to medications given. Document Released: 09/25/2004 Document Revised: 05/31/2011 Document Reviewed: 03/05/2008 [facility] Patient Information 2015 [place], [place]. This information is not intended to replace advice given to you by your health care provider. Make sure you discuss any questions you have with your health care provider.

## 2014-04-11 NOTE — H&P (Signed)
Chief Complaint: Monoclonal gammopathy  Referring Physician(s): Neijstrom,Eric S  History of Present Illness: [name] is a 69 y.o. 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 She denies any chest pain, shortness of breath or palpitations. She denies any active signs of bleeding or excessive bruising. She denies any recent fever or chills. The patient denies any use of CPAP or chronic oxygen use. She has previously tolerated sedation without complications during a colonoscopy.    Past Medical History  Diagnosis Date  . Systolic heart failure     Chronic  . Overweight(278.02)   . Cardiomyopathy secondary   . Hypertension     takes Diovan and Spirololactone daily  . Dysrhythmia     pt takes Digoxin and Carvedilol daily  . CHF (congestive heart failure)     takes Lasix prn  . Sleep apnea     doesn't use CPAP  . Pneumonia     hx of;many yrs ago  . Peripheral neuropathy   . Arthritis     ankles  . Gout     hx of x 1 time in Aug 2012  . GERD (gastroesophageal reflux disease)     takes Protonix seldom  . H/O hiatal hernia   . Hemorrhoid   . Anemia     takes Ferrous Fumarate 2 times week  . Blood transfusion     hx of-48yrs ago  . Thyroid nodule     right  . Diabetes mellitus     takes Glipizide and Metformin daily  . Cataract     bilateral and immature    Past Surgical History  Procedure Laterality Date  . Cardiac defirillator removed      removed <60months;removed d/t shocking pt 15times;pierced sac of heart 1000cc ;drained  . Stomach stapling surgery  80's  . Appendectomy  1991  . Tummy tuck  1991  . Leg lipoma    . Abdominal hysterectomy  1991  . Tubal ligation  1971  . Ovarian cyst removed  1991  . Cholecystectomy open  1992  . Colonoscopy    . Thyroid lobectomy  07/27/2011    Procedure: THYROID LOBECTOMY;  Surgeon: Gayland Curry, MD,FACS;  Location: [place];  Service: General;  Laterality: Right;  .  Cardiac defibrillator placement  >73yrs ago  . Defibrillator removed    . Colonoscopy N/A 02/08/2014    Procedure: COLONOSCOPY;  Surgeon: Danie Binder, MD;  [facility];  Service: Endoscopy;  Laterality: N/A;  8:30AM  . Esophagogastroduodenoscopy N/A 02/08/2014    Procedure: ESOPHAGOGASTRODUODENOSCOPY (EGD);  Surgeon: Danie Binder, MD;  Location: [facility];  Service: Endoscopy;  Laterality: N/A;    Allergies: Doxycycline  Medications: Prior to Admission medications   Medication Sig Start Date End Date Taking? Authorizing Provider  Ascorbic Acid (VITAMIN C PO) Take 1 tablet by mouth daily.   Yes Historical Provider, MD  aspirin EC 81 MG tablet Take 81 mg by mouth at bedtime.   Yes Historical Provider, MD  atorvastatin (LIPITOR) 10 MG tablet Take 10 mg by mouth daily at 6 PM.  01/09/13  Yes Historical Provider, MD  carvedilol (COREG) 25 MG tablet Take 1 tablet (25 mg total) by mouth 2 (two) times daily. 08/06/13 04/17/15 Yes Arnoldo Lenis, MD  CINNAMON PO Take 2 capsules by mouth every morning.    Yes Historical Provider, MD  Cyanocobalamin (VITAMIN B-12 PO) Take 1 tablet by mouth daily.  Yes Historical Provider, MD  Flaxseed, Linseed, (FLAX SEED OIL PO) Take 1 capsule by mouth every morning.    Yes Historical Provider, MD  furosemide (LASIX) 40 MG tablet Take 1 tablet (40 mg total) by mouth daily. For fluid retention 10/27/13  Yes Barton Dubois, MD  glipiZIDE (GLUCOTROL) 5 MG tablet Take 5 mg by mouth every evening.  11/23/12  Yes Historical Provider, MD  HYDROcodone-acetaminophen (NORCO) 5-325 MG per tablet Take 1-2 tablets by mouth every 4 (four) hours as needed. 12/16/13  Yes Nat Christen, MD  levothyroxine (SYNTHROID, LEVOTHROID) 100 MCG tablet Take 100 mcg by mouth daily before breakfast.   Yes Historical Provider, MD  spironolactone (ALDACTONE) 25 MG tablet Take 25 mg by mouth daily.    Yes Historical Provider, MD  tetrahydrozoline (EYE DROPS) 0.05 % ophthalmic solution  Place 1 drop into both eyes daily as needed. For dry eye relief   Yes Historical Provider, MD  TRADJENTA 5 MG TABS tablet Take 5 mg by mouth daily.  11/21/13  Yes Historical Provider, MD  valsartan (DIOVAN) 40 MG tablet Take 20 mg by mouth daily as needed (If blood pressure is high).  10/27/13  Yes Barton Dubois, MD  ferrous fumarate (HEMOCYTE - 106 MG FE) 325 (106 FE) MG TABS tablet Take 1 tablet (106 mg of iron total) by mouth 2 (two) times daily. Patient not taking: Reported on 04/09/2014 10/27/13   Barton Dubois, MD    Family History  Problem Relation Age of Onset  . Anesthesia problems Neg Hx   . Hypotension Neg Hx   . Malignant hyperthermia Neg Hx   . Pseudochol deficiency Neg Hx     History   Social History  . Marital Status: Widowed    Spouse Name: N/A    Number of Children: N/A  . Years of Education: N/A   Social History Main Topics  . Smoking status: Never Smoker   . Smokeless tobacco: None  . Alcohol Use: Yes     Comment: glass wine occasionally  . Drug Use: No  . Sexual Activity: Yes    Birth Control/ Protection: Surgical   Other Topics Concern  . None   Social History Narrative   Review of Systems: A 12 point ROS discussed and pertinent positives are indicated in the HPI above.  All other systems are negative.  Review of Systems  Vital Signs: BP 137/83 mmHg  Pulse 75  Temp(Src) 98.3 F (36.8 C) (Oral)  Resp 16  Ht $R'5\' 4"'VI$  (1.626 m)  Wt 306 lb (138.801 kg)  BMI 52.50 kg/m2  SpO2 100%  Physical Exam  Constitutional: She is oriented to person, place, and time. No distress.  HENT:  Head: Normocephalic and atraumatic.  Neck: No tracheal deviation present.  Cardiovascular: Normal rate and regular rhythm.  Exam reveals no gallop and no friction rub.   No murmur heard. Pulmonary/Chest: Effort normal and breath sounds normal. No respiratory distress. She has no wheezes. She has no rales.  Abdominal: Soft. Bowel sounds are normal.  Neurological: She is alert and  oriented to person, place, and time.  Skin: Skin is warm and dry. She is not diaphoretic.  Psychiatric: She has a normal mood and affect. Her behavior is normal.    Imaging: Dg Bone Survey Met  04/11/2014   CLINICAL DATA:  Proteinuria and gammopathy.  EXAM: METASTATIC BONE SURVEY  COMPARISON:  None.  FINDINGS: Lateral skull: No blastic or lytic bone lesions. Sella appears normal.  Cervical spine: No blastic  or lytic bone lesions. Surgical clips right neck region.  Thoracic spine: There is extensive degenerative type change. No blastic or lytic bone lesions appreciable. No fracture or spondylolisthesis.  Chest: Lungs are clear. Heart size pulmonary vascularity are normal. No adenopathy. No blastic or lytic bone lesions appreciable. There is soft tissue calcification in the superior lateral left breast region.  Lumbar spine: Mild dextroscoliosis. Degenerative changes several levels. No blastic or lytic bone lesions. No fracture or spondylolisthesis.  Pelvis: No fracture or dislocation. No blastic or lytic bone lesions.  Left shoulder and humerus: Degenerative change left shoulder. No blastic or lytic bone lesions.  Left forearm:  No blastic or lytic bone lesions.  Right shoulder and humerus: Degenerative change right shoulder. No blastic or lytic bone lesions.  Right forearm: There is evidence of old trauma involving the distal radius and ulna with remodeling. No blastic or lytic bone lesions.  Left femur: No blastic or lytic bone lesions. Degenerative change left hip joint.  Left tibia and fibula: There is degenerative change in the left knee and ankle regions. No blastic or lytic bone lesions.  Right femur: There is degenerative change in the right hip joint. No blastic or lytic bone lesions.  Right tibia and fibula: There is degenerative change in the right knee and ankle joint regions. No blastic or lytic bone lesions.  IMPRESSION: No blastic or lytic bone lesions. No bony changes suggesting multiple  myeloma. Evidence of old trauma involving the distal right radius and ulna with remodeling. Lungs clear. Multifocal degenerative type change. Calcification in superior left breast near left axilla of uncertain etiology.   Electronically Signed   By: Lowella Grip M.D.   On: 04/11/2014 09:44    Labs:  CBC:  Recent Labs  10/24/13 2037 10/25/13 0533 10/25/13 2143 10/26/13 0528 10/27/13 0550  WBC 4.7 4.0  --  3.6* 3.9*  HGB 7.8* 6.9* 8.1* 8.0* 9.6*  HCT 25.0* 22.1* 25.2* 25.2* 29.6*  PLT 134* 127*  --  131* 137*    COAGS:  Recent Labs  10/24/13 2037  INR 1.41    BMP:  Recent Labs  10/24/13 2037 10/25/13 0533 10/26/13 0528 10/27/13 0550  NA 136* 138 140 140  K 5.5* 4.8 4.9 4.9  CL 100 104 104 102  CO2 $Re'21 21 21 22  'ypj$ GLUCOSE 95 100* 141* 93  BUN 40* 36* 31* 36*  CALCIUM 9.4 9.0 8.9 9.2  CREATININE 1.66* 1.49* 1.47* 1.68*  GFRNONAA 31* 35* 36* 30*  GFRAA 36* 41* 41* 35*    LIVER FUNCTION TESTS:  Recent Labs  10/24/13 2037 10/25/13 0533 10/26/13 0528  BILITOT 0.2* <0.2* 0.4  AST 25 19 66*  ALT 18 15 56*  ALKPHOS 88 80 124*  PROT 8.4* 7.5 7.9  ALBUMIN 3.0* 2.6* 2.6*   Assessment and Plan: Monoclonal gammopathy Scheduled today for image guided bone marrow biopsy with moderate sedation Patient has been NPO, labs reviewed Risks and Benefits discussed with the patient. All of the patient's questions were answered, patient is agreeable to proceed. Consent signed and in chart.     SignedHedy Jacob 04/11/2014, 10:14 AM

## 2014-04-11 NOTE — Procedures (Signed)
Procedure:  CT guided right iliac bone marrow biopsy Findings:  Aspirate and core biopsy performed of right iliac bone marrow.  No complications.

## 2014-04-18 LAB — CHROMOSOME ANALYSIS, BONE MARROW

## 2014-05-01 DIAGNOSIS — D472 Monoclonal gammopathy: Secondary | ICD-10-CM | POA: Diagnosis not present

## 2014-05-01 DIAGNOSIS — C9000 Multiple myeloma not having achieved remission: Secondary | ICD-10-CM | POA: Diagnosis not present

## 2014-05-01 DIAGNOSIS — I509 Heart failure, unspecified: Secondary | ICD-10-CM | POA: Insufficient documentation

## 2014-05-01 DIAGNOSIS — R809 Proteinuria, unspecified: Secondary | ICD-10-CM | POA: Diagnosis not present

## 2014-05-09 ENCOUNTER — Other Ambulatory Visit (HOSPITAL_COMMUNITY): Payer: Self-pay | Admitting: Oncology

## 2014-05-09 DIAGNOSIS — C9000 Multiple myeloma not having achieved remission: Secondary | ICD-10-CM

## 2014-05-13 ENCOUNTER — Other Ambulatory Visit: Payer: Self-pay

## 2014-05-13 DIAGNOSIS — D649 Anemia, unspecified: Secondary | ICD-10-CM

## 2014-05-20 ENCOUNTER — Encounter: Payer: Self-pay | Admitting: Gastroenterology

## 2014-05-24 ENCOUNTER — Ambulatory Visit (HOSPITAL_COMMUNITY)
Admission: RE | Admit: 2014-05-24 | Discharge: 2014-05-24 | Disposition: A | Discharge status: Home / Self Care | Payer: Medicare Other | Source: Ambulatory Visit | Attending: Oncology | Admitting: Oncology

## 2014-05-24 ENCOUNTER — Ambulatory Visit (HOSPITAL_COMMUNITY): Payer: Medicare Other

## 2014-05-24 DIAGNOSIS — R109 Unspecified abdominal pain: Secondary | ICD-10-CM | POA: Insufficient documentation

## 2014-05-24 DIAGNOSIS — C9000 Multiple myeloma not having achieved remission: Secondary | ICD-10-CM | POA: Insufficient documentation

## 2014-05-24 DIAGNOSIS — R0781 Pleurodynia: Secondary | ICD-10-CM | POA: Insufficient documentation

## 2014-05-24 DIAGNOSIS — M5126 Other intervertebral disc displacement, lumbar region: Secondary | ICD-10-CM | POA: Diagnosis not present

## 2014-05-24 DIAGNOSIS — E279 Disorder of adrenal gland, unspecified: Secondary | ICD-10-CM | POA: Diagnosis not present

## 2014-05-24 DIAGNOSIS — M47896 Other spondylosis, lumbar region: Secondary | ICD-10-CM | POA: Diagnosis not present

## 2014-05-24 DIAGNOSIS — M5021 Other cervical disc displacement,  high cervical region: Secondary | ICD-10-CM | POA: Diagnosis not present

## 2014-05-24 DIAGNOSIS — N281 Cyst of kidney, acquired: Secondary | ICD-10-CM | POA: Diagnosis not present

## 2014-05-24 DIAGNOSIS — M419 Scoliosis, unspecified: Secondary | ICD-10-CM | POA: Insufficient documentation

## 2014-05-24 DIAGNOSIS — M4806 Spinal stenosis, lumbar region: Secondary | ICD-10-CM | POA: Insufficient documentation

## 2014-05-24 DIAGNOSIS — E119 Type 2 diabetes mellitus without complications: Secondary | ICD-10-CM | POA: Diagnosis not present

## 2014-05-24 DIAGNOSIS — M5022 Other cervical disc displacement, mid-cervical region: Secondary | ICD-10-CM | POA: Insufficient documentation

## 2014-05-24 DIAGNOSIS — M5136 Other intervertebral disc degeneration, lumbar region: Secondary | ICD-10-CM | POA: Diagnosis not present

## 2014-05-29 DIAGNOSIS — N189 Chronic kidney disease, unspecified: Secondary | ICD-10-CM | POA: Diagnosis not present

## 2014-05-29 DIAGNOSIS — E119 Type 2 diabetes mellitus without complications: Secondary | ICD-10-CM | POA: Diagnosis not present

## 2014-05-29 DIAGNOSIS — D649 Anemia, unspecified: Secondary | ICD-10-CM | POA: Diagnosis not present

## 2014-05-29 DIAGNOSIS — C9000 Multiple myeloma not having achieved remission: Secondary | ICD-10-CM | POA: Diagnosis not present

## 2014-05-29 DIAGNOSIS — R809 Proteinuria, unspecified: Secondary | ICD-10-CM | POA: Diagnosis not present

## 2014-05-29 DIAGNOSIS — N289 Disorder of kidney and ureter, unspecified: Secondary | ICD-10-CM | POA: Diagnosis not present

## 2014-06-14 DIAGNOSIS — C9000 Multiple myeloma not having achieved remission: Secondary | ICD-10-CM | POA: Diagnosis not present

## 2014-06-14 DIAGNOSIS — M545 Low back pain: Secondary | ICD-10-CM | POA: Diagnosis not present

## 2014-06-14 DIAGNOSIS — E279 Disorder of adrenal gland, unspecified: Secondary | ICD-10-CM | POA: Diagnosis not present

## 2014-06-14 DIAGNOSIS — E119 Type 2 diabetes mellitus without complications: Secondary | ICD-10-CM | POA: Diagnosis not present

## 2014-06-14 DIAGNOSIS — D509 Iron deficiency anemia, unspecified: Secondary | ICD-10-CM | POA: Diagnosis not present

## 2014-06-14 DIAGNOSIS — Z8679 Personal history of other diseases of the circulatory system: Secondary | ICD-10-CM | POA: Diagnosis not present

## 2014-06-14 DIAGNOSIS — I509 Heart failure, unspecified: Secondary | ICD-10-CM | POA: Diagnosis not present

## 2014-06-14 DIAGNOSIS — K219 Gastro-esophageal reflux disease without esophagitis: Secondary | ICD-10-CM | POA: Diagnosis not present

## 2014-06-14 DIAGNOSIS — Z86718 Personal history of other venous thrombosis and embolism: Secondary | ICD-10-CM | POA: Diagnosis not present

## 2014-06-14 DIAGNOSIS — Z9884 Bariatric surgery status: Secondary | ICD-10-CM | POA: Diagnosis not present

## 2014-06-14 DIAGNOSIS — M150 Primary generalized (osteo)arthritis: Secondary | ICD-10-CM | POA: Diagnosis not present

## 2014-06-14 DIAGNOSIS — Z9089 Acquired absence of other organs: Secondary | ICD-10-CM | POA: Diagnosis not present

## 2014-06-14 DIAGNOSIS — G8929 Other chronic pain: Secondary | ICD-10-CM | POA: Diagnosis not present

## 2014-06-14 DIAGNOSIS — R200 Anesthesia of skin: Secondary | ICD-10-CM | POA: Diagnosis not present

## 2014-06-14 DIAGNOSIS — I129 Hypertensive chronic kidney disease with stage 1 through stage 4 chronic kidney disease, or unspecified chronic kidney disease: Secondary | ICD-10-CM | POA: Diagnosis not present

## 2014-06-14 DIAGNOSIS — G4733 Obstructive sleep apnea (adult) (pediatric): Secondary | ICD-10-CM | POA: Diagnosis not present

## 2014-06-14 DIAGNOSIS — N183 Chronic kidney disease, stage 3 (moderate): Secondary | ICD-10-CM | POA: Diagnosis not present

## 2014-06-14 DIAGNOSIS — Z7982 Long term (current) use of aspirin: Secondary | ICD-10-CM | POA: Diagnosis not present

## 2014-06-16 DIAGNOSIS — Z8709 Personal history of other diseases of the respiratory system: Secondary | ICD-10-CM | POA: Insufficient documentation

## 2014-06-16 DIAGNOSIS — Z9889 Other specified postprocedural states: Secondary | ICD-10-CM | POA: Insufficient documentation

## 2014-06-16 DIAGNOSIS — Z9851 Tubal ligation status: Secondary | ICD-10-CM | POA: Insufficient documentation

## 2014-06-16 DIAGNOSIS — Z86718 Personal history of other venous thrombosis and embolism: Secondary | ICD-10-CM | POA: Insufficient documentation

## 2014-06-16 DIAGNOSIS — E119 Type 2 diabetes mellitus without complications: Secondary | ICD-10-CM | POA: Insufficient documentation

## 2014-06-16 DIAGNOSIS — E278 Other specified disorders of adrenal gland: Secondary | ICD-10-CM | POA: Insufficient documentation

## 2014-06-16 DIAGNOSIS — K219 Gastro-esophageal reflux disease without esophagitis: Secondary | ICD-10-CM | POA: Insufficient documentation

## 2014-06-16 DIAGNOSIS — M1990 Unspecified osteoarthritis, unspecified site: Secondary | ICD-10-CM | POA: Insufficient documentation

## 2014-06-16 DIAGNOSIS — Z9049 Acquired absence of other specified parts of digestive tract: Secondary | ICD-10-CM | POA: Insufficient documentation

## 2014-06-16 DIAGNOSIS — I10 Essential (primary) hypertension: Secondary | ICD-10-CM | POA: Insufficient documentation

## 2014-06-16 DIAGNOSIS — Z90710 Acquired absence of both cervix and uterus: Secondary | ICD-10-CM | POA: Insufficient documentation

## 2014-06-16 DIAGNOSIS — Z9884 Bariatric surgery status: Secondary | ICD-10-CM | POA: Insufficient documentation

## 2014-06-16 DIAGNOSIS — D563 Thalassemia minor: Secondary | ICD-10-CM | POA: Insufficient documentation

## 2014-06-16 DIAGNOSIS — G4733 Obstructive sleep apnea (adult) (pediatric): Secondary | ICD-10-CM | POA: Insufficient documentation

## 2014-06-16 DIAGNOSIS — Z8679 Personal history of other diseases of the circulatory system: Secondary | ICD-10-CM | POA: Insufficient documentation

## 2014-06-24 ENCOUNTER — Other Ambulatory Visit: Payer: Self-pay | Admitting: Family Medicine

## 2014-06-24 DIAGNOSIS — Z803 Family history of malignant neoplasm of breast: Secondary | ICD-10-CM

## 2014-06-24 DIAGNOSIS — R921 Mammographic calcification found on diagnostic imaging of breast: Secondary | ICD-10-CM

## 2014-07-02 DIAGNOSIS — T451X5A Adverse effect of antineoplastic and immunosuppressive drugs, initial encounter: Secondary | ICD-10-CM | POA: Diagnosis not present

## 2014-07-02 DIAGNOSIS — I427 Cardiomyopathy due to drug and external agent: Secondary | ICD-10-CM | POA: Diagnosis not present

## 2014-07-02 DIAGNOSIS — I509 Heart failure, unspecified: Secondary | ICD-10-CM | POA: Diagnosis not present

## 2014-07-02 DIAGNOSIS — I517 Cardiomegaly: Secondary | ICD-10-CM | POA: Diagnosis not present

## 2014-07-02 DIAGNOSIS — Z0189 Encounter for other specified special examinations: Secondary | ICD-10-CM | POA: Diagnosis not present

## 2014-07-02 DIAGNOSIS — C9000 Multiple myeloma not having achieved remission: Secondary | ICD-10-CM | POA: Diagnosis not present

## 2014-07-03 DIAGNOSIS — I509 Heart failure, unspecified: Secondary | ICD-10-CM | POA: Diagnosis not present

## 2014-07-03 DIAGNOSIS — N289 Disorder of kidney and ureter, unspecified: Secondary | ICD-10-CM | POA: Diagnosis not present

## 2014-07-03 DIAGNOSIS — C9000 Multiple myeloma not having achieved remission: Secondary | ICD-10-CM | POA: Diagnosis not present

## 2014-07-04 DIAGNOSIS — D509 Iron deficiency anemia, unspecified: Secondary | ICD-10-CM | POA: Diagnosis not present

## 2014-07-04 DIAGNOSIS — D519 Vitamin B12 deficiency anemia, unspecified: Secondary | ICD-10-CM | POA: Diagnosis not present

## 2014-07-10 DIAGNOSIS — R509 Fever, unspecified: Secondary | ICD-10-CM | POA: Diagnosis not present

## 2014-07-10 DIAGNOSIS — D472 Monoclonal gammopathy: Secondary | ICD-10-CM | POA: Diagnosis not present

## 2014-07-10 DIAGNOSIS — C9000 Multiple myeloma not having achieved remission: Secondary | ICD-10-CM | POA: Diagnosis not present

## 2014-07-11 DIAGNOSIS — D472 Monoclonal gammopathy: Secondary | ICD-10-CM | POA: Diagnosis not present

## 2014-07-11 DIAGNOSIS — C9000 Multiple myeloma not having achieved remission: Secondary | ICD-10-CM | POA: Diagnosis not present

## 2014-07-11 DIAGNOSIS — R509 Fever, unspecified: Secondary | ICD-10-CM | POA: Diagnosis not present

## 2014-07-17 DIAGNOSIS — C9000 Multiple myeloma not having achieved remission: Secondary | ICD-10-CM | POA: Diagnosis not present

## 2014-07-18 ENCOUNTER — Ambulatory Visit
Admission: RE | Admit: 2014-07-18 | Discharge: 2014-07-18 | Disposition: A | Discharge status: Home / Self Care | Payer: Medicaid Other | Source: Ambulatory Visit | Attending: Family Medicine | Admitting: Family Medicine

## 2014-07-18 DIAGNOSIS — E11329 Type 2 diabetes mellitus with mild nonproliferative diabetic retinopathy without macular edema: Secondary | ICD-10-CM | POA: Diagnosis not present

## 2014-07-18 DIAGNOSIS — R921 Mammographic calcification found on diagnostic imaging of breast: Secondary | ICD-10-CM

## 2014-07-18 DIAGNOSIS — E890 Postprocedural hypothyroidism: Secondary | ICD-10-CM | POA: Diagnosis not present

## 2014-07-18 DIAGNOSIS — Z803 Family history of malignant neoplasm of breast: Secondary | ICD-10-CM

## 2014-07-23 ENCOUNTER — Telehealth: Payer: Self-pay

## 2014-07-23 NOTE — Telephone Encounter (Signed)
Received call that patient is enrolled in [facility] CHF clinic and was given scale and will communicate patient regularly and also give [place] feedback

## 2014-07-30 DIAGNOSIS — C9000 Multiple myeloma not having achieved remission: Secondary | ICD-10-CM | POA: Diagnosis not present

## 2014-07-30 DIAGNOSIS — D61818 Other pancytopenia: Secondary | ICD-10-CM | POA: Insufficient documentation

## 2014-08-07 DIAGNOSIS — N289 Disorder of kidney and ureter, unspecified: Secondary | ICD-10-CM | POA: Diagnosis not present

## 2014-08-07 DIAGNOSIS — N951 Menopausal and female climacteric states: Secondary | ICD-10-CM | POA: Insufficient documentation

## 2014-08-07 DIAGNOSIS — Z780 Asymptomatic menopausal state: Secondary | ICD-10-CM | POA: Diagnosis not present

## 2014-08-07 DIAGNOSIS — C9000 Multiple myeloma not having achieved remission: Secondary | ICD-10-CM | POA: Diagnosis not present

## 2014-08-07 DIAGNOSIS — Z0000 Encounter for general adult medical examination without abnormal findings: Secondary | ICD-10-CM | POA: Diagnosis not present

## 2014-08-07 DIAGNOSIS — D649 Anemia, unspecified: Secondary | ICD-10-CM | POA: Diagnosis not present

## 2014-08-07 DIAGNOSIS — E1122 Type 2 diabetes mellitus with diabetic chronic kidney disease: Secondary | ICD-10-CM | POA: Diagnosis not present

## 2014-08-14 DIAGNOSIS — R809 Proteinuria, unspecified: Secondary | ICD-10-CM | POA: Diagnosis not present

## 2014-08-14 DIAGNOSIS — E118 Type 2 diabetes mellitus with unspecified complications: Secondary | ICD-10-CM | POA: Diagnosis not present

## 2014-08-14 DIAGNOSIS — D61818 Other pancytopenia: Secondary | ICD-10-CM | POA: Diagnosis not present

## 2014-08-14 DIAGNOSIS — N289 Disorder of kidney and ureter, unspecified: Secondary | ICD-10-CM | POA: Diagnosis not present

## 2014-08-14 DIAGNOSIS — C9000 Multiple myeloma not having achieved remission: Secondary | ICD-10-CM | POA: Diagnosis not present

## 2014-08-21 DIAGNOSIS — C9000 Multiple myeloma not having achieved remission: Secondary | ICD-10-CM | POA: Diagnosis not present

## 2014-08-28 DIAGNOSIS — C9000 Multiple myeloma not having achieved remission: Secondary | ICD-10-CM | POA: Diagnosis not present

## 2014-09-02 DIAGNOSIS — E119 Type 2 diabetes mellitus without complications: Secondary | ICD-10-CM | POA: Diagnosis not present

## 2014-09-03 DIAGNOSIS — R809 Proteinuria, unspecified: Secondary | ICD-10-CM | POA: Diagnosis not present

## 2014-09-03 DIAGNOSIS — I10 Essential (primary) hypertension: Secondary | ICD-10-CM | POA: Diagnosis not present

## 2014-09-03 DIAGNOSIS — D649 Anemia, unspecified: Secondary | ICD-10-CM | POA: Diagnosis not present

## 2014-09-03 DIAGNOSIS — N183 Chronic kidney disease, stage 3 (moderate): Secondary | ICD-10-CM | POA: Diagnosis not present

## 2014-09-04 DIAGNOSIS — Z5111 Encounter for antineoplastic chemotherapy: Secondary | ICD-10-CM | POA: Insufficient documentation

## 2014-09-04 DIAGNOSIS — C9000 Multiple myeloma not having achieved remission: Secondary | ICD-10-CM | POA: Diagnosis not present

## 2014-09-11 DIAGNOSIS — C9000 Multiple myeloma not having achieved remission: Secondary | ICD-10-CM | POA: Diagnosis not present

## 2014-09-12 DIAGNOSIS — C9000 Multiple myeloma not having achieved remission: Secondary | ICD-10-CM | POA: Diagnosis not present

## 2014-09-17 ENCOUNTER — Observation Stay (HOSPITAL_COMMUNITY): Payer: Medicare Other

## 2014-09-17 ENCOUNTER — Observation Stay (HOSPITAL_COMMUNITY)
Admission: EM | Admit: 2014-09-17 | Discharge: 2014-09-19 | Disposition: A | Discharge status: Home / Self Care | Payer: Medicare Other | Attending: Internal Medicine | Admitting: Internal Medicine

## 2014-09-17 ENCOUNTER — Encounter (HOSPITAL_COMMUNITY): Payer: Self-pay | Admitting: Emergency Medicine

## 2014-09-17 ENCOUNTER — Emergency Department (HOSPITAL_COMMUNITY): Payer: Medicare Other

## 2014-09-17 DIAGNOSIS — Z8579 Personal history of other malignant neoplasms of lymphoid, hematopoietic and related tissues: Secondary | ICD-10-CM | POA: Diagnosis not present

## 2014-09-17 DIAGNOSIS — E785 Hyperlipidemia, unspecified: Secondary | ICD-10-CM

## 2014-09-17 DIAGNOSIS — W01198A Fall on same level from slipping, tripping and stumbling with subsequent striking against other object, initial encounter: Secondary | ICD-10-CM | POA: Diagnosis not present

## 2014-09-17 DIAGNOSIS — N179 Acute kidney failure, unspecified: Secondary | ICD-10-CM | POA: Diagnosis not present

## 2014-09-17 DIAGNOSIS — I509 Heart failure, unspecified: Secondary | ICD-10-CM | POA: Insufficient documentation

## 2014-09-17 DIAGNOSIS — I5020 Unspecified systolic (congestive) heart failure: Secondary | ICD-10-CM | POA: Diagnosis not present

## 2014-09-17 DIAGNOSIS — Z79899 Other long term (current) drug therapy: Secondary | ICD-10-CM | POA: Insufficient documentation

## 2014-09-17 DIAGNOSIS — I10 Essential (primary) hypertension: Secondary | ICD-10-CM | POA: Insufficient documentation

## 2014-09-17 DIAGNOSIS — E041 Nontoxic single thyroid nodule: Secondary | ICD-10-CM | POA: Diagnosis not present

## 2014-09-17 DIAGNOSIS — K649 Unspecified hemorrhoids: Secondary | ICD-10-CM | POA: Diagnosis not present

## 2014-09-17 DIAGNOSIS — R079 Chest pain, unspecified: Secondary | ICD-10-CM

## 2014-09-17 DIAGNOSIS — I959 Hypotension, unspecified: Secondary | ICD-10-CM | POA: Diagnosis not present

## 2014-09-17 DIAGNOSIS — G8929 Other chronic pain: Secondary | ICD-10-CM | POA: Insufficient documentation

## 2014-09-17 DIAGNOSIS — Y9389 Activity, other specified: Secondary | ICD-10-CM | POA: Insufficient documentation

## 2014-09-17 DIAGNOSIS — G629 Polyneuropathy, unspecified: Secondary | ICD-10-CM | POA: Insufficient documentation

## 2014-09-17 DIAGNOSIS — I5022 Chronic systolic (congestive) heart failure: Secondary | ICD-10-CM

## 2014-09-17 DIAGNOSIS — H269 Unspecified cataract: Secondary | ICD-10-CM | POA: Insufficient documentation

## 2014-09-17 DIAGNOSIS — D649 Anemia, unspecified: Secondary | ICD-10-CM | POA: Diagnosis not present

## 2014-09-17 DIAGNOSIS — Y998 Other external cause status: Secondary | ICD-10-CM | POA: Insufficient documentation

## 2014-09-17 DIAGNOSIS — N289 Disorder of kidney and ureter, unspecified: Secondary | ICD-10-CM | POA: Insufficient documentation

## 2014-09-17 DIAGNOSIS — M109 Gout, unspecified: Secondary | ICD-10-CM | POA: Diagnosis not present

## 2014-09-17 DIAGNOSIS — R7989 Other specified abnormal findings of blood chemistry: Secondary | ICD-10-CM | POA: Diagnosis not present

## 2014-09-17 DIAGNOSIS — W19XXXA Unspecified fall, initial encounter: Secondary | ICD-10-CM | POA: Diagnosis not present

## 2014-09-17 DIAGNOSIS — C9000 Multiple myeloma not having achieved remission: Secondary | ICD-10-CM

## 2014-09-17 DIAGNOSIS — E118 Type 2 diabetes mellitus with unspecified complications: Secondary | ICD-10-CM

## 2014-09-17 DIAGNOSIS — G4730 Sleep apnea, unspecified: Secondary | ICD-10-CM | POA: Diagnosis not present

## 2014-09-17 DIAGNOSIS — M79605 Pain in left leg: Secondary | ICD-10-CM | POA: Insufficient documentation

## 2014-09-17 DIAGNOSIS — Z8701 Personal history of pneumonia (recurrent): Secondary | ICD-10-CM | POA: Diagnosis not present

## 2014-09-17 DIAGNOSIS — I429 Cardiomyopathy, unspecified: Secondary | ICD-10-CM | POA: Diagnosis not present

## 2014-09-17 DIAGNOSIS — Z7982 Long term (current) use of aspirin: Secondary | ICD-10-CM | POA: Diagnosis not present

## 2014-09-17 DIAGNOSIS — Y92009 Unspecified place in unspecified non-institutional (private) residence as the place of occurrence of the external cause: Secondary | ICD-10-CM | POA: Insufficient documentation

## 2014-09-17 DIAGNOSIS — R778 Other specified abnormalities of plasma proteins: Secondary | ICD-10-CM

## 2014-09-17 DIAGNOSIS — S29001A Unspecified injury of muscle and tendon of front wall of thorax, initial encounter: Principal | ICD-10-CM | POA: Insufficient documentation

## 2014-09-17 DIAGNOSIS — K219 Gastro-esophageal reflux disease without esophagitis: Secondary | ICD-10-CM | POA: Insufficient documentation

## 2014-09-17 DIAGNOSIS — M1990 Unspecified osteoarthritis, unspecified site: Secondary | ICD-10-CM | POA: Diagnosis not present

## 2014-09-17 DIAGNOSIS — N183 Chronic kidney disease, stage 3 unspecified: Secondary | ICD-10-CM | POA: Diagnosis present

## 2014-09-17 DIAGNOSIS — I499 Cardiac arrhythmia, unspecified: Secondary | ICD-10-CM | POA: Insufficient documentation

## 2014-09-17 DIAGNOSIS — D696 Thrombocytopenia, unspecified: Secondary | ICD-10-CM | POA: Diagnosis not present

## 2014-09-17 HISTORY — DX: Multiple myeloma not having achieved remission: C90.00

## 2014-09-17 HISTORY — DX: Pain in left leg: M79.605

## 2014-09-17 HISTORY — DX: Other chronic pain: G89.29

## 2014-09-17 HISTORY — DX: Dorsalgia, unspecified: M54.9

## 2014-09-17 LAB — COMPREHENSIVE METABOLIC PANEL
ALT: 43 U/L (ref 14–54)
AST: 29 U/L (ref 15–41)
Albumin: 3.5 g/dL (ref 3.5–5.0)
Alkaline Phosphatase: 93 U/L (ref 38–126)
Anion gap: 11 (ref 5–15)
BUN: 35 mg/dL — ABNORMAL HIGH (ref 6–20)
CO2: 26 mmol/L (ref 22–32)
Calcium: 8.9 mg/dL (ref 8.9–10.3)
Chloride: 100 mmol/L — ABNORMAL LOW (ref 101–111)
Creatinine, Ser: 1.92 mg/dL — ABNORMAL HIGH (ref 0.44–1.00)
GFR calc Af Amer: 30 mL/min — ABNORMAL LOW (ref >60)
GFR, EST NON AFRICAN AMERICAN: 26 mL/min — AB (ref >60)
Glucose, Bld: 185 mg/dL — ABNORMAL HIGH (ref 65–99)
Potassium: 4.3 mmol/L (ref 3.5–5.1)
SODIUM: 137 mmol/L (ref 135–145)
Total Bilirubin: 0.7 mg/dL (ref 0.3–1.2)
Total Protein: 6.7 g/dL (ref 6.5–8.1)

## 2014-09-17 LAB — CBC WITH DIFFERENTIAL/PLATELET
BASOS ABS: 0 10*3/uL (ref 0.0–0.1)
BASOS PCT: 1 % (ref 0–1)
EOS ABS: 0.1 10*3/uL (ref 0.0–0.7)
Eosinophils Relative: 3 % (ref 0–5)
HCT: 33.4 % — ABNORMAL LOW (ref 36.0–46.0)
Hemoglobin: 10.5 g/dL — ABNORMAL LOW (ref 12.0–15.0)
Lymphocytes Relative: 29 % (ref 12–46)
Lymphs Abs: 0.9 10*3/uL (ref 0.7–4.0)
MCH: 26.6 pg (ref 26.0–34.0)
MCHC: 31.4 g/dL (ref 30.0–36.0)
MCV: 84.8 fL (ref 78.0–100.0)
Monocytes Absolute: 0.2 10*3/uL (ref 0.1–1.0)
Monocytes Relative: 8 % (ref 3–12)
NEUTROS ABS: 1.9 10*3/uL (ref 1.7–7.7)
NEUTROS PCT: 60 % (ref 43–77)
PLATELETS: 100 10*3/uL — AB (ref 150–400)
RBC: 3.94 MIL/uL (ref 3.87–5.11)
RDW: 18.1 % — ABNORMAL HIGH (ref 11.5–15.5)
WBC: 3.2 10*3/uL — ABNORMAL LOW (ref 4.0–10.5)

## 2014-09-17 LAB — URINALYSIS, ROUTINE W REFLEX MICROSCOPIC
Bilirubin Urine: NEGATIVE
Glucose, UA: NEGATIVE mg/dL
Hgb urine dipstick: NEGATIVE
KETONES UR: NEGATIVE mg/dL
Leukocytes, UA: NEGATIVE
Nitrite: NEGATIVE
PH: 5.5 (ref 5.0–8.0)
Protein, ur: NEGATIVE mg/dL
Specific Gravity, Urine: 1.025 (ref 1.005–1.030)
UROBILINOGEN UA: 0.2 mg/dL (ref 0.0–1.0)

## 2014-09-17 LAB — GLUCOSE, CAPILLARY: Glucose-Capillary: 129 mg/dL — ABNORMAL HIGH (ref 65–99)

## 2014-09-17 LAB — TROPONIN I
TROPONIN I: 0.1 ng/mL — AB (ref <0.031)
TROPONIN I: 0.37 ng/mL — AB (ref <0.031)
Troponin I: 0.32 ng/mL — ABNORMAL HIGH (ref <0.031)

## 2014-09-17 LAB — PROTIME-INR
INR: 1.28 (ref 0.00–1.49)
PROTHROMBIN TIME: 16.1 s — AB (ref 11.6–15.2)

## 2014-09-17 LAB — CBG MONITORING, ED: GLUCOSE-CAPILLARY: 190 mg/dL — AB (ref 65–99)

## 2014-09-17 LAB — D-DIMER, QUANTITATIVE (NOT AT ARMC): D DIMER QUANT: 1.09 ug{FEU}/mL — AB (ref 0.00–0.48)

## 2014-09-17 LAB — APTT: aPTT: 24 seconds (ref 24–37)

## 2014-09-17 MED ORDER — SPIRONOLACTONE 25 MG PO TABS: 12.5000 mg | ORAL_TABLET | Freq: Every day | ORAL | Status: DC | PRN

## 2014-09-17 MED ORDER — HEPARIN (PORCINE) IN NACL 100-0.45 UNIT/ML-% IJ SOLN
INTRAMUSCULAR | Status: AC
  Administered 2014-09-17: 1250 [IU]/h via INTRAVENOUS
  Filled 2014-09-17: qty 250

## 2014-09-17 MED ORDER — SODIUM CHLORIDE 0.9 % IV SOLN
INTRAVENOUS | Status: DC
  Administered 2014-09-17 – 2014-09-18 (×2): via INTRAVENOUS

## 2014-09-17 MED ORDER — ACETAMINOPHEN 325 MG PO TABS: 650.0000 mg | ORAL_TABLET | ORAL | Status: DC | PRN

## 2014-09-17 MED ORDER — CARVEDILOL 12.5 MG PO TABS: 25.0000 mg | ORAL_TABLET | Freq: Two times a day (BID) | ORAL | Status: DC

## 2014-09-17 MED ORDER — LEVOTHYROXINE SODIUM 100 MCG PO TABS
100.0000 ug | ORAL_TABLET | Freq: Every day | ORAL | Status: DC
  Administered 2014-09-18 – 2014-09-19 (×2): 100 ug via ORAL
  Filled 2014-09-17 (×2): qty 1

## 2014-09-17 MED ORDER — HYDROCODONE-ACETAMINOPHEN 5-325 MG PO TABS: 1.0000 | ORAL_TABLET | ORAL | Status: DC | PRN

## 2014-09-17 MED ORDER — HEPARIN SODIUM (PORCINE) 5000 UNIT/ML IJ SOLN
5000.0000 [IU] | Freq: Three times a day (TID) | INTRAMUSCULAR | Status: DC
Start: 2014-09-17 — End: 2014-09-17

## 2014-09-17 MED ORDER — ENSURE ENLIVE PO LIQD
237.0000 mL | Freq: Two times a day (BID) | ORAL | Status: DC
  Administered 2014-09-18: 237 mL via ORAL

## 2014-09-17 MED ORDER — SODIUM CHLORIDE 0.9 % IV SOLN: INTRAVENOUS | Status: DC

## 2014-09-17 MED ORDER — ACETAMINOPHEN 325 MG PO TABS: 650.0000 mg | ORAL_TABLET | Freq: Four times a day (QID) | ORAL | Status: DC | PRN

## 2014-09-17 MED ORDER — INSULIN ASPART 100 UNIT/ML ~~LOC~~ SOLN
0.0000 [IU] | Freq: Three times a day (TID) | SUBCUTANEOUS | Status: DC
Start: 2014-09-18 — End: 2014-09-19
  Administered 2014-09-18 – 2014-09-19 (×2): 3 [IU] via SUBCUTANEOUS

## 2014-09-17 MED ORDER — HEPARIN SODIUM (PORCINE) 5000 UNIT/ML IJ SOLN: 5000.0000 [IU] | Freq: Three times a day (TID) | INTRAMUSCULAR | Status: DC

## 2014-09-17 MED ORDER — SODIUM CHLORIDE 0.9 % IJ SOLN
3.0000 mL | Freq: Two times a day (BID) | INTRAMUSCULAR | Status: DC
  Administered 2014-09-18: 3 mL via INTRAVENOUS

## 2014-09-17 MED ORDER — ASPIRIN EC 81 MG PO TBEC
81.0000 mg | DELAYED_RELEASE_TABLET | Freq: Every day | ORAL | Status: DC
  Administered 2014-09-17 – 2014-09-18 (×2): 81 mg via ORAL
  Filled 2014-09-17 (×2): qty 1

## 2014-09-17 MED ORDER — HEPARIN (PORCINE) IN NACL 100-0.45 UNIT/ML-% IJ SOLN
1250.0000 [IU]/h | INTRAMUSCULAR | Status: DC
  Administered 2014-09-17: 1250 [IU]/h via INTRAVENOUS

## 2014-09-17 MED ORDER — DEXAMETHASONE 6 MG PO TABS
20.0000 mg | ORAL_TABLET | ORAL | Status: DC
  Filled 2014-09-17: qty 1

## 2014-09-17 MED ORDER — GI COCKTAIL ~~LOC~~: 30.0000 mL | Freq: Four times a day (QID) | ORAL | Status: DC | PRN

## 2014-09-17 MED ORDER — HEPARIN BOLUS VIA INFUSION
4000.0000 [IU] | Freq: Once | INTRAVENOUS | Status: AC
  Administered 2014-09-17: 4000 [IU] via INTRAVENOUS
  Filled 2014-09-17: qty 4000

## 2014-09-17 MED ORDER — FUROSEMIDE 40 MG PO TABS
40.0000 mg | ORAL_TABLET | Freq: Every day | ORAL | Status: DC
  Administered 2014-09-17: 40 mg via ORAL
  Filled 2014-09-17: qty 1

## 2014-09-17 MED ORDER — DEXAMETHASONE 6 MG PO TABS: 20.0000 mg | ORAL_TABLET | ORAL | Status: DC

## 2014-09-17 MED ORDER — ATORVASTATIN CALCIUM 10 MG PO TABS
10.0000 mg | ORAL_TABLET | Freq: Every day | ORAL | Status: DC
  Administered 2014-09-17: 10 mg via ORAL
  Filled 2014-09-17: qty 1

## 2014-09-17 MED ORDER — ONDANSETRON HCL 4 MG/2ML IJ SOLN: 4.0000 mg | Freq: Four times a day (QID) | INTRAMUSCULAR | Status: DC | PRN

## 2014-09-17 MED ORDER — CARVEDILOL 12.5 MG PO TABS
25.0000 mg | ORAL_TABLET | Freq: Two times a day (BID) | ORAL | Status: DC
  Administered 2014-09-17 – 2014-09-19 (×4): 25 mg via ORAL
  Filled 2014-09-17 (×4): qty 2

## 2014-09-17 MED ORDER — INSULIN ASPART 100 UNIT/ML ~~LOC~~ SOLN
0.0000 [IU] | Freq: Every day | SUBCUTANEOUS | Status: DC
Start: 2014-09-17 — End: 2014-09-19

## 2014-09-17 MED ORDER — ONDANSETRON HCL 4 MG PO TABS: 4.0000 mg | ORAL_TABLET | Freq: Four times a day (QID) | ORAL | Status: DC | PRN

## 2014-09-17 MED ORDER — LENALIDOMIDE 10 MG PO CAPS
10.0000 mg | ORAL_CAPSULE | Freq: Every day | ORAL | Status: DC
  Filled 2014-09-17 (×2): qty 1

## 2014-09-17 MED ORDER — ACETAMINOPHEN 650 MG RE SUPP
650.0000 mg | Freq: Four times a day (QID) | RECTAL | Status: DC | PRN
Start: 2014-09-17 — End: 2014-09-19

## 2014-09-17 NOTE — ED Notes (Signed)
Attempted to call report to unit 300.  RN not answering, charge RN off unit in meeting.

## 2014-09-17 NOTE — Progress Notes (Signed)
ANTICOAGULATION CONSULT NOTE - Initial Consult  Pharmacy Consult for Heparin Indication: chest pain/ACS  Allergies  Allergen Reactions  . Doxycycline Rash    Patient Measurements: Height: 5\' 4"  (162.6 cm) Weight: (!) 308 lb (139.708 kg) IBW/kg (Calculated) : 54.7 HEPARIN DW (KG): 89.8  Vital Signs: Temp: 97.9 F (36.6 C) (06/28 1431) Temp Source: Oral (06/28 1431) BP: 98/42 mmHg (06/28 1730) Pulse Rate: 61 (06/28 1730)  Labs:  Recent Labs  09/17/14 1503 09/17/14 1708  HGB 10.5*  --   HCT 33.4*  --   PLT 100*  --   CREATININE 1.92*  --   TROPONINI 0.10* 0.32*    Estimated Creatinine Clearance: 39.8 mL/min (by C-G formula based on Cr of 1.92).   Medical History: Past Medical History  Diagnosis Date  . Systolic heart failure     Chronic  . Overweight(278.02)   . Cardiomyopathy secondary   . Hypertension     takes Diovan and Spirololactone daily  . Dysrhythmia     pt takes Digoxin and Carvedilol daily  . Sleep apnea     doesn't use CPAP  . Pneumonia     hx of;many yrs ago  . Peripheral neuropathy   . Arthritis     ankles  . Gout     hx of x 1 time in Aug 2012  . GERD (gastroesophageal reflux disease)     takes Protonix seldom  . H/O hiatal hernia   . Hemorrhoid   . Anemia     takes Ferrous Fumarate 2 times week  . Blood transfusion     hx of-10yrs ago  . Thyroid nodule     right  . Diabetes mellitus     takes Glipizide and Metformin daily  . Cataract     bilateral and immature  . Left leg pain   . Chronic back pain   . Multiple myeloma     IgA myeloma; chemo at [facility] Onc MD at [facility]  . CHF (congestive heart failure)     takes Lasix prn, Echo 2015 with EF 40%    Medications:  Prescriptions prior to admission  Medication Sig Dispense Refill Last Dose  . acyclovir (ZOVIRAX) 200 MG capsule Take 200 mg by mouth 2 (two) times daily.   09/17/2014 at Unknown time  . Ascorbic Acid (VITAMIN C PO) Take 1 tablet by mouth daily.   09/17/2014 at  Unknown time  . aspirin EC 81 MG tablet Take 81 mg by mouth at bedtime.   09/16/2014 at Unknown time  . atorvastatin (LIPITOR) 10 MG tablet Take 10 mg by mouth daily at 6 PM.    09/16/2014 at Unknown time  . carvedilol (COREG) 25 MG tablet Take 1 tablet (25 mg total) by mouth 2 (two) times daily. 60 tablet 11 09/17/2014 at 800  . CINNAMON PO Take 2 capsules by mouth every morning.    09/17/2014 at Unknown time  . Cyanocobalamin (VITAMIN B-12 PO) Take 1 tablet by mouth daily.   09/17/2014 at Unknown time  . Flaxseed, Linseed, (FLAX SEED OIL PO) Take 1 capsule by mouth every morning.    09/17/2014 at Unknown time  . furosemide (LASIX) 40 MG tablet Take 1 tablet (40 mg total) by mouth daily. For fluid retention (Patient taking differently: Take 40 mg by mouth at bedtime. For fluid retention) 30 tablet 1 09/16/2014 at Unknown time  . glipiZIDE (GLUCOTROL) 5 MG tablet Take 5 mg by mouth every evening.    09/16/2014 at Unknown  time  . HUMALOG KWIKPEN 100 UNIT/ML KiwkPen Take 5-12 Units by mouth 3 (three) times daily.   09/17/2014 at Unknown time  . HYDROcodone-acetaminophen (NORCO) 5-325 MG per tablet Take 1-2 tablets by mouth every 4 (four) hours as needed. (Patient taking differently: Take 1-2 tablets by mouth every 4 (four) hours as needed (pain). ) 20 tablet 0 09/16/2014 at Unknown time  . levothyroxine (SYNTHROID, LEVOTHROID) 100 MCG tablet Take 100 mcg by mouth daily before breakfast.   09/17/2014 at Unknown time  . REVLIMID 10 MG capsule Take 10 mg by mouth daily.   09/17/2014 at Unknown time  . spironolactone (ALDACTONE) 25 MG tablet Take 12.5 mg by mouth daily as needed (fluid).    Past Week at Unknown time  . TRADJENTA 5 MG TABS tablet Take 5 mg by mouth daily.    09/17/2014 at Unknown time  . dexamethasone (DECADRON) 4 MG tablet Take 20 mg by mouth once a week. On Wednesday   09/11/2014  . tetrahydrozoline (EYE DROPS) 0.05 % ophthalmic solution Place 1 drop into both eyes daily as needed. For dry eye relief    unknown   Assessment: Okay for Protocol, recent blood transfusion at [place] labs pending for this admission.  Thrombocytopenia noted (100K), 148K 5 months ago.  Goal of Therapy:  Heparin level 0.3-0.7 units/ml Monitor platelets by anticoagulation protocol: Yes   Plan:  Give 4000 units bolus x 1 Start heparin infusion at 1250 units/hr Check anti-Xa level in 6-8 hours and daily while on heparin Continue to monitor H&H and platelets  Pricilla Larsson 09/17/2014,6:35 PM

## 2014-09-17 NOTE — H&P (Addendum)
[facility] History and Physical  LEILIANA FOODY XAJ:[id number] DOB: Apr 14, 1945 DOA: 09/17/2014  Referring physician: Dr Thurnell Garbe - [facility] PCP: Maggie Font, MD   Chief Complaint: CP and generalized weakness  HPI: [name] is a 69 y.o. female   Pt states that she felt week and fell in the kitchen. Legs "were rubbery." Unable to stand alone. Home CBG >100 at that time. On floor for 15-20 min before able to stand. This made pt very anxious and she then developed chest "fullness." Improved w/ rest and improvement in anxiety. Denies lightheadedness, HA, palpitations, SOB, radiation, nausea, leg swelling above baseline, diaphoresis. Last Chemotherapy 7 days ago [facility]).  Received a blood transfusion 5 days ago.  Currently CP free.  nml BP 100/70.   Review of Systems:  Constitutional:  No weight loss, night sweats, Fevers, chills HEENT:  No headaches, Difficulty swallowing,Tooth/dental problems,Sore throat,  No sneezing, itching, ear ache, nasal congestion, post nasal drip,  Cardio-vascular: Per HPI GI:  No heartburn, indigestion, abdominal pain, nausea, vomiting, diarrhea, change in bowel habits, loss of appetite  Resp:   No shortness of breath with exertion or at rest. No excess mucus, no productive cough, No non-productive cough, No coughing up of blood.No change in color of mucus.No wheezing.No chest wall deformity  Skin:  no rash or lesions.  GU:  no dysuria, change in color of urine, no urgency or frequency. No flank pain.  Musculoskeletal:   No joint pain or swelling. No decreased range of motion. No back pain.  Psych:  No change in mood or affect. No depression or anxiety. No memory loss.   Past Medical History  Diagnosis Date  . Systolic heart failure     Chronic  . Overweight(278.02)   . Cardiomyopathy secondary   . Hypertension     takes Diovan and Spirololactone daily  . Dysrhythmia     pt takes Digoxin and Carvedilol daily  . Sleep  apnea     doesn't use CPAP  . Pneumonia     hx of;many yrs ago  . Peripheral neuropathy   . Arthritis     ankles  . Gout     hx of x 1 time in Aug 2012  . GERD (gastroesophageal reflux disease)     takes Protonix seldom  . H/O hiatal hernia   . Hemorrhoid   . Anemia     takes Ferrous Fumarate 2 times week  . Blood transfusion     hx of-32yr ago  . Thyroid nodule     right  . Diabetes mellitus     takes Glipizide and Metformin daily  . Cataract     bilateral and immature  . Left leg pain   . Chronic back pain   . Multiple myeloma     Ig[place] . CHF (congestive heart failure)     takes Lasix prn, Echo 2015 with EF 40%   Past Surgical History  Procedure Laterality Date  . Cardiac defirillator removed      removed <344monthremoved d/t shocking pt 15times;pierced sac of heart 1000cc ;drained  . Stomach stapling surgery  80's  . Appendectomy  1991  . Tummy tuck  1991  . Leg lipoma    . Abdominal hysterectomy  1991  . Tubal ligation  1971  . Ovarian cyst removed  1991  . Cholecystectomy open  1992  . Colonoscopy    . Thyroid lobectomy  07/27/2011  Procedure: THYROID LOBECTOMY;  Surgeon: Gayland Curry, MD,FACS;  Location: [place];  Service: General;  Laterality: Right;  . Cardiac defibrillator placement  >74yr ago  . Defibrillator removed    . Colonoscopy N/A 02/08/2014    Procedure: COLONOSCOPY;  Surgeon: SDanie Binder MD;  [facility];  Service: Endoscopy;  Laterality: N/A;  8:30AM  . Esophagogastroduodenoscopy N/A 02/08/2014    Procedure: ESOPHAGOGASTRODUODENOSCOPY (EGD);  Surgeon: SDanie Binder MD;  [facility];  Service: Endoscopy;  Laterality: N/A;   Social History:  reports that she has never smoked. She has never used smokeless tobacco. She reports that she drinks alcohol. She reports that she does not use illicit drugs.  Allergies  Allergen Reactions  . Doxycycline Rash    Family History    Problem Relation Age of Onset  . Anesthesia problems Neg Hx   . Hypotension Neg Hx   . Malignant hyperthermia Neg Hx   . Pseudochol deficiency Neg Hx   . Heart failure Other      Prior to Admission medications   Medication Sig Start Date End Date Taking? Authorizing Provider  Ascorbic Acid (VITAMIN C PO) Take 1 tablet by mouth daily.    Historical Provider, MD  aspirin EC 81 MG tablet Take 81 mg by mouth at bedtime.    Historical Provider, MD  atorvastatin (LIPITOR) 10 MG tablet Take 10 mg by mouth daily at 6 PM.  01/09/13   Historical Provider, MD  carvedilol (COREG) 25 MG tablet Take 1 tablet (25 mg total) by mouth 2 (two) times daily. 08/06/13 04/17/15  JArnoldo Lenis MD  CINNAMON PO Take 2 capsules by mouth every morning.     Historical Provider, MD  Cyanocobalamin (VITAMIN B-12 PO) Take 1 tablet by mouth daily.    Historical Provider, MD  Flaxseed, Linseed, (FLAX SEED OIL PO) Take 1 capsule by mouth every morning.     Historical Provider, MD  furosemide (LASIX) 40 MG tablet Take 1 tablet (40 mg total) by mouth daily. For fluid retention 10/27/13   CBarton Dubois MD  glipiZIDE (GLUCOTROL) 5 MG tablet Take 5 mg by mouth every evening.  11/23/12   Historical Provider, MD  HYDROcodone-acetaminophen (NORCO) 5-325 MG per tablet Take 1-2 tablets by mouth every 4 (four) hours as needed. 12/16/13   BNat Christen MD  levothyroxine (SYNTHROID, LEVOTHROID) 100 MCG tablet Take 100 mcg by mouth daily before breakfast.    Historical Provider, MD  spironolactone (ALDACTONE) 25 MG tablet Take 25 mg by mouth daily.     Historical Provider, MD  tetrahydrozoline (EYE DROPS) 0.05 % ophthalmic solution Place 1 drop into both eyes daily as needed. For dry eye relief    Historical Provider, MD  TRADJENTA 5 MG TABS tablet Take 5 mg by mouth daily.  11/21/13   Historical Provider, MD  valsartan (DIOVAN) 40 MG tablet Take 20 mg by mouth daily as needed (If blood pressure is high).  10/27/13   CBarton Dubois MD    Physical Exam: Filed Vitals:   09/17/14 1600 09/17/14 1630 09/17/14 1700 09/17/14 1730  BP: 95/51 106/56 111/60 98/42  Pulse: 62 61 62 61  Temp:      TempSrc:      Resp:      Height:      Weight:      SpO2: 100% 99% 99% 98%    Wt Readings from Last 3 Encounters:  09/17/14 139.708 kg (308 lb)  04/11/14 138.801 kg (306  lb)  12/16/13 135.172 kg (298 lb)    General:  Appears calm and comfortable Eyes:  PERRL, normal lids, irises & conjunctiva ENT:  grossly normal hearing, lips & tongue Neck:  no LAD, masses or thyromegaly Cardiovascular:  RRR, no m/r/g. Baseline 2+ Pitting edema Telemetry:  SR, no arrhythmias  Respiratory:  CTA bilaterally, no w/r/r. Normal respiratory effort. Abdomen:  soft, ntnd Skin:  no rash or induration seen on limited exam Musculoskeletal:  grossly normal tone BUE/BLE Psychiatric:  grossly normal mood and affect, speech fluent and appropriate Neurologic:  grossly non-focal.          Labs on Admission:  Basic Metabolic Panel:  Recent Labs Lab 09/17/14 1503  NA 137  K 4.3  CL 100*  CO2 26  GLUCOSE 185*  BUN 35*  CREATININE 1.92*  CALCIUM 8.9   Liver Function Tests:  Recent Labs Lab 09/17/14 1503  AST 29  ALT 43  ALKPHOS 93  BILITOT 0.7  PROT 6.7  ALBUMIN 3.5   No results for input(s): LIPASE, AMYLASE in the last 168 hours. No results for input(s): AMMONIA in the last 168 hours. CBC:  Recent Labs Lab 09/17/14 1503  WBC 3.2*  NEUTROABS 1.9  HGB 10.5*  HCT 33.4*  MCV 84.8  PLT 100*   Cardiac Enzymes:  Recent Labs Lab 09/17/14 1503  TROPONINI 0.10*    BNP (last 3 results) No results for input(s): BNP in the last 8760 hours.  ProBNP (last 3 results)  Recent Labs  10/24/13 2037  PROBNP 522.6*    CBG:  Recent Labs Lab 09/17/14 1445  GLUCAP 190*    Radiological Exams on Admission: Dg Chest Port 1 View  09/17/2014   CLINICAL DATA:  Chest pain.  EXAM: PORTABLE CHEST - 1 VIEW  COMPARISON:  None.   FINDINGS: Mild cardiomegaly is noted. No pneumothorax is noted. Mild central pulmonary vascular congestion is noted. Bibasilar opacities are noted, but is uncertain if this is due to overlying soft tissue and body habitus. Visualized bony thorax appears intact.  IMPRESSION: Bibasilar opacities are noted which may be due to overlying soft tissue artifact due to patient's body habitus, but bibasilar edema or atelectasis cannot be excluded. Mild central pulmonary vascular congestion is noted.   Electronically Signed   By: Marijo Conception, M.D.   On: 09/17/2014 15:08    EKG: Independently reviewed. Sinus, non-specific T wave changes, No sign of ACS. No change from previous  Assessment/Plan Active Problems:   Essential hypertension   CKD (chronic kidney disease) stage 3, GFR 30-59 ml/min   Hypotension   Chronic systolic congestive heart failure   AKI (acute kidney injury)   Diabetes mellitus with complication   HLD (hyperlipidemia)   Multiple myeloma   Chest pain   CP: EKG unchanged from previous - no sign of ACS. Troponin 0.1. Currently asymptomatic. H/o Cardiomyopathy. Suspect Chemotherpy related myocardium injury (Revlimid + other chemo???) vs false + lab result vs true ischemia vs PE (malignancy and decreased activity level place patient at high risk.) - Tele - cycle troponins - EKG in am - cards consult if Troponin trends up - GI cocktail - D-Dimer - IVF - ASA  UPDATE - 18:30. TROPONIN TRENDING UP. DDIMER ELEVATED. REPEAT EKG AND CTA, HEP DRIP ORDERED  Generalized Weakness: Likely secondary to chemo. And physical deconditioning from decreased activity level from ongoing chronic illnesses.  -PT/OT  MM: Last chemotherapy 4 days ago at [facility]. Hemoglobin stable, last blood transfusion 3 days  ago, thrombocythemia at 100. - Follow-up outpatient.  - CBC in a.m. - Decadron Qwkly on Wed  Acute on chronic kidney disease: Creatinine 1.9, baseline 1.5 - IVF - BMP in the  a.m.  DM: Orals and insulin. Last A1c in preDM range at 6.2 in 2015. - A1c  - SSI - Hold home Metformin and Glipizide  HLD: - continue statin  Chronic Systolic HF: No acute exacerbation. EF 40%. - continue bblocker, Arlyce Harman, Lasix  Hypotension: at baseline - monitor  Hypothyroid: - continue synthroid  Code Status: FULL DVT Prophylaxis: Hep Family Communication: Sister Disposition Plan: Pending improvement     Tobey Lippard Lenna Sciara, MD Family Medicine [facility] [email or web address] Password [email or web address]

## 2014-09-17 NOTE — ED Notes (Signed)
Pt reports falling at home. Pt states when she got up she started having chest pain. Pt reports recent dx of multiple myeloma.

## 2014-09-17 NOTE — ED Provider Notes (Signed)
CSN: [id number]     Arrival date & time 09/17/14  1424 History   First MD Initiated Contact with Patient 09/17/14 1448     Chief Complaint  Patient presents with  . Chest Pain  . Fatigue  . Fall      HPI Pt was seen at 1455. Per pt and her family, c/o sudden onset and resolution of one episode of fall that occurred PTA. Pt states she "just got so weak" and fell to the floor onto her left side. Family states they found pt laying on the floor on her left side. Pt was unable to stand up on her own and needed heavy assist x2 to get up. Pt's family states pt's BP was "low" at home. Pt then told her family that she had "chest pain," so she came to the ED for evaluation. Pt states now she is "just nervous." Pt has hx of multiple myeloma, LD chemo 6 days ago. Denies syncope, no AMS, no neck or back pain, no CP/palpitations, no SOB/cough, no abd pain, no N/V/D, no fevers, no rash.    Past Medical History  Diagnosis Date  . Systolic heart failure     Chronic  . Overweight(278.02)   . Cardiomyopathy secondary   . Hypertension     takes Diovan and Spirololactone daily  . Dysrhythmia     pt takes Digoxin and Carvedilol daily  . CHF (congestive heart failure)     takes Lasix prn  . Sleep apnea     doesn't use CPAP  . Pneumonia     hx of;many yrs ago  . Peripheral neuropathy   . Arthritis     ankles  . Gout     hx of x 1 time in Aug 2012  . GERD (gastroesophageal reflux disease)     takes Protonix seldom  . H/O hiatal hernia   . Hemorrhoid   . Anemia     takes Ferrous Fumarate 2 times week  . Blood transfusion     hx of-59yrs ago  . Thyroid nodule     right  . Diabetes mellitus     takes Glipizide and Metformin daily  . Cataract     bilateral and immature  . Left leg pain   . Chronic back pain   . Multiple myeloma     IgA myeloma; chemo at [place] Ledell Noss), [facility] at [facility]   Past Surgical History  Procedure Laterality Date  . Cardiac defirillator removed      removed  <3months;removed d/t shocking pt 15times;pierced sac of heart 1000cc ;drained  . Stomach stapling surgery  80's  . Appendectomy  1991  . Tummy tuck  1991  . Leg lipoma    . Abdominal hysterectomy  1991  . Tubal ligation  1971  . Ovarian cyst removed  1991  . Cholecystectomy open  1992  . Colonoscopy    . Thyroid lobectomy  07/27/2011    Procedure: THYROID LOBECTOMY;  Surgeon: Gayland Curry, MD,FACS;  Location: [place];  Service: General;  Laterality: Right;  . Cardiac defibrillator placement  >41yrs ago  . Defibrillator removed    . Colonoscopy N/A 02/08/2014    Procedure: COLONOSCOPY;  Surgeon: Danie Binder, MD;  [facility];  Service: Endoscopy;  Laterality: N/A;  8:30AM  . Esophagogastroduodenoscopy N/A 02/08/2014    Procedure: ESOPHAGOGASTRODUODENOSCOPY (EGD);  Surgeon: Danie Binder, MD;  [facility];  Service: Endoscopy;  Laterality: N/A;   Family History  Problem Relation Age of Onset  . Anesthesia problems Neg Hx   . Hypotension Neg Hx   . Malignant hyperthermia Neg Hx   . Pseudochol deficiency Neg Hx   . Heart failure Other    History  Substance Use Topics  . Smoking status: Never Smoker   . Smokeless tobacco: Never Used  . Alcohol Use: Yes     Comment: glass wine occasionally   OB History    Gravida Para Term Preterm AB TAB SAB Ectopic Multiple Living   '2 2 2            '$ Review of Systems ROS: Statement: All systems negative except as marked or noted in the HPI; Constitutional: Negative for fever and chills. +generalized weakness/fatigue.; ; Eyes: Negative for eye pain, redness and discharge. ; ; ENMT: Negative for ear pain, hoarseness, nasal congestion, sinus pressure and sore throat. ; ; Cardiovascular: Negative for palpitations, diaphoresis, dyspnea and peripheral edema. ; ; Respiratory: Negative for cough, wheezing and stridor. ; ; Gastrointestinal: Negative for nausea, vomiting, diarrhea, abdominal pain, blood in stool, hematemesis, jaundice  and rectal bleeding. . ; ; Genitourinary: Negative for dysuria, flank pain and hematuria. ; ; Musculoskeletal: +CP. Negative for back pain and neck pain. Negative for swelling and trauma.; ; Skin: Negative for pruritus, rash, abrasions, blisters, bruising and skin lesion.; ; Neuro: Negative for headache, lightheadedness and neck stiffness. Negative for altered level of consciousness , altered mental status, extremity weakness, paresthesias, involuntary movement, seizure and syncope.      Allergies  Doxycycline  Home Medications   Prior to Admission medications   Medication Sig Start Date End Date Taking? Authorizing Provider  Ascorbic Acid (VITAMIN C PO) Take 1 tablet by mouth daily.    Historical Provider, MD  aspirin EC 81 MG tablet Take 81 mg by mouth at bedtime.    Historical Provider, MD  atorvastatin (LIPITOR) 10 MG tablet Take 10 mg by mouth daily at 6 PM.  01/09/13   Historical Provider, MD  carvedilol (COREG) 25 MG tablet Take 1 tablet (25 mg total) by mouth 2 (two) times daily. 08/06/13 04/17/15  Arnoldo Lenis, MD  CINNAMON PO Take 2 capsules by mouth every morning.     Historical Provider, MD  Cyanocobalamin (VITAMIN B-12 PO) Take 1 tablet by mouth daily.    Historical Provider, MD  Flaxseed, Linseed, (FLAX SEED OIL PO) Take 1 capsule by mouth every morning.     Historical Provider, MD  furosemide (LASIX) 40 MG tablet Take 1 tablet (40 mg total) by mouth daily. For fluid retention 10/27/13   Barton Dubois, MD  glipiZIDE (GLUCOTROL) 5 MG tablet Take 5 mg by mouth every evening.  11/23/12   Historical Provider, MD  HYDROcodone-acetaminophen (NORCO) 5-325 MG per tablet Take 1-2 tablets by mouth every 4 (four) hours as needed. 12/16/13   Nat Christen, MD  levothyroxine (SYNTHROID, LEVOTHROID) 100 MCG tablet Take 100 mcg by mouth daily before breakfast.    Historical Provider, MD  spironolactone (ALDACTONE) 25 MG tablet Take 25 mg by mouth daily.     Historical Provider, MD  tetrahydrozoline  (EYE DROPS) 0.05 % ophthalmic solution Place 1 drop into both eyes daily as needed. For dry eye relief    Historical Provider, MD  TRADJENTA 5 MG TABS tablet Take 5 mg by mouth daily.  11/21/13   Historical Provider, MD  valsartan (DIOVAN) 40 MG tablet Take 20 mg by mouth daily as needed (If blood pressure is high).  10/27/13  Vassie Loll, MD   BP 107/48 mmHg  Pulse 60  Temp(Src) 97.9 F (36.6 C) (Oral)  Resp 20  Ht 5\' 4"  (1.626 m)  Wt 308 lb (139.708 kg)  BMI 52.84 kg/m2  SpO2 98%   15:17 Orthostatic Vital Signs KS  Orthostatic Lying  - BP- Lying: 88/51 mmHg ; Pulse- Lying: 67  Orthostatic Sitting - BP- Sitting: 94/60 mmHg ; Pulse- Sitting: 74  Orthostatic Standing at 0 minutes - BP- Standing at 0 minutes: 97/59 mmHg ; Pulse- Standing at 0 minutes: 86      Physical Exam  1500: Physical examination:  Nursing notes reviewed; Vital signs and O2 SAT reviewed;  Constitutional: Well developed, Well nourished, In no acute distress; Head:  Normocephalic, atraumatic; Eyes: EOMI, PERRL, No scleral icterus; ENMT: Mouth and pharynx normal, Mucous membranes dry; Neck: Supple, Full range of motion, No lymphadenopathy; Cardiovascular: Regular rate and rhythm, No gallop; Respiratory: Breath sounds coarse & equal bilaterally, No wheezes.  Speaking full sentences with ease, Normal respiratory effort/excursion; Chest: Nontender, No deformity. Movement normal; Abdomen: Soft, Nontender, Nondistended, Normal bowel sounds; Genitourinary: No CVA tenderness; Extremities: Pulses normal, pelvis stable. NT bilat hips/knees/ankles/feet. No tenderness, +2 pedal edema bilat. No calf asymmetry.; Neuro: AA&Ox3, Major CN grossly intact. No facial droop. Speech clear. No gross focal motor deficits in extremities.; Skin: Color normal, Warm, Dry.   ED Course  Procedures      EKG Interpretation   Date/Time:  Tuesday September 17 2014 14:28:14 EDT Ventricular Rate:  60 PR Interval:  158 QRS Duration: 90 QT Interval:   428 QTC Calculation: 428 R Axis:   -11 Text Interpretation:  Normal sinus rhythm Left axis deviation Cannot rule  out Inferior infarct , age undetermined Cannot rule out Anterior infarct ,  age undetermined Baseline wander When compared with ECG of 10/24/2013 No  significant change was found Confirmed by [facility]  MD, [facility] [id number]) on  09/17/2014 3:54:13 PM      MDM  MDM Reviewed: previous chart, nursing note and vitals Reviewed previous: labs and ECG Interpretation: labs, ECG and x-ray   Results for orders placed or performed during the hospital encounter of 09/17/14  Troponin I  Result Value Ref Range   Troponin I 0.10 (H) <0.031 ng/mL  Comprehensive metabolic panel  Result Value Ref Range   Sodium 137 135 - 145 mmol/L   Potassium 4.3 3.5 - 5.1 mmol/L   Chloride 100 (L) 101 - 111 mmol/L   CO2 26 22 - 32 mmol/L   Glucose, Bld 185 (H) 65 - 99 mg/dL   BUN 35 (H) 6 - 20 mg/dL   Creatinine, Ser 09/19/14 (H) 0.44 - 1.00 mg/dL   Calcium 8.9 8.9 - 3.73 mg/dL   Total Protein 6.7 6.5 - 8.1 g/dL   Albumin 3.5 3.5 - 5.0 g/dL   AST 29 15 - 41 U/L   ALT 43 14 - 54 U/L   Alkaline Phosphatase 93 38 - 126 U/L   Total Bilirubin 0.7 0.3 - 1.2 mg/dL   GFR calc non Af Amer 26 (L) >60 mL/min   GFR calc Af Amer 30 (L) >60 mL/min   Anion gap 11 5 - 15  CBC with Differential  Result Value Ref Range   WBC 3.2 (L) 4.0 - 10.5 K/uL   RBC 3.94 3.87 - 5.11 MIL/uL   Hemoglobin 10.5 (L) 12.0 - 15.0 g/dL   HCT 31.3 (L) 56.9 - 98.9 %   MCV 84.8 78.0 - 100.0 fL  MCH 26.6 26.0 - 34.0 pg   MCHC 31.4 30.0 - 36.0 g/dL   RDW 18.1 (H) 11.5 - 15.5 %   Platelets 100 (L) 150 - 400 K/uL   Neutrophils Relative % 60 43 - 77 %   Neutro Abs 1.9 1.7 - 7.7 K/uL   Lymphocytes Relative 29 12 - 46 %   Lymphs Abs 0.9 0.7 - 4.0 K/uL   Monocytes Relative 8 3 - 12 %   Monocytes Absolute 0.2 0.1 - 1.0 K/uL   Eosinophils Relative 3 0 - 5 %   Eosinophils Absolute 0.1 0.0 - 0.7 K/uL   Basophils Relative 1 0 - 1 %    Basophils Absolute 0.0 0.0 - 0.1 K/uL  CBG monitoring, ED  Result Value Ref Range   Glucose-Capillary 190 (H) 65 - 99 mg/dL   Dg Chest Port 1 View 09/17/2014   CLINICAL DATA:  Chest pain.  EXAM: PORTABLE CHEST - 1 VIEW  COMPARISON:  None.  FINDINGS: Mild cardiomegaly is noted. No pneumothorax is noted. Mild central pulmonary vascular congestion is noted. Bibasilar opacities are noted, but is uncertain if this is due to overlying soft tissue and body habitus. Visualized bony thorax appears intact.  IMPRESSION: Bibasilar opacities are noted which may be due to overlying soft tissue artifact due to patient's body habitus, but bibasilar edema or atelectasis cannot be excluded. Mild central pulmonary vascular congestion is noted.   Electronically Signed   By: Marijo Conception, M.D.   On: 09/17/2014 15:08    1630:  Pt with mild hypotension and elevated BUN/Cr from baseline; judicious IVF given as pt has hx of low EF/CHF. Troponin elevated; but no acute STTW changes on EKG. H/H/plts per baseline. Dx and testing d/w pt and family.  Questions answered.  Verb understanding, agreeable to admit.  T/C to [facility] Dr. Marily Memos, case discussed, including:  HPI, pertinent PM/SHx, VS/PE, dx testing, ED course and treatment:  Agreeable to admit, requests to write temporary orders, obtain tele bed to team [facility].      Francine Graven, DO 09/19/14 1535

## 2014-09-17 NOTE — Progress Notes (Signed)
Pt w/ borderline Cr/Cl and unable to obtain adequate IV access. Will change CTA to VQ scan  Linna Darner, MD Family Medicine 09/17/2014, 8:26 PM

## 2014-09-18 ENCOUNTER — Observation Stay (HOSPITAL_COMMUNITY): Payer: Medicare Other

## 2014-09-18 ENCOUNTER — Ambulatory Visit: Payer: Self-pay | Admitting: Cardiology

## 2014-09-18 ENCOUNTER — Encounter (HOSPITAL_COMMUNITY): Payer: Self-pay

## 2014-09-18 DIAGNOSIS — S299XXA Unspecified injury of thorax, initial encounter: Secondary | ICD-10-CM | POA: Diagnosis not present

## 2014-09-18 DIAGNOSIS — S29001A Unspecified injury of muscle and tendon of front wall of thorax, initial encounter: Secondary | ICD-10-CM | POA: Diagnosis not present

## 2014-09-18 DIAGNOSIS — I5022 Chronic systolic (congestive) heart failure: Secondary | ICD-10-CM | POA: Diagnosis not present

## 2014-09-18 DIAGNOSIS — R079 Chest pain, unspecified: Secondary | ICD-10-CM | POA: Diagnosis not present

## 2014-09-18 DIAGNOSIS — N183 Chronic kidney disease, stage 3 (moderate): Secondary | ICD-10-CM

## 2014-09-18 DIAGNOSIS — R0789 Other chest pain: Secondary | ICD-10-CM | POA: Diagnosis not present

## 2014-09-18 DIAGNOSIS — R7989 Other specified abnormal findings of blood chemistry: Secondary | ICD-10-CM | POA: Diagnosis not present

## 2014-09-18 DIAGNOSIS — D649 Anemia, unspecified: Secondary | ICD-10-CM | POA: Diagnosis not present

## 2014-09-18 DIAGNOSIS — C9000 Multiple myeloma not having achieved remission: Secondary | ICD-10-CM

## 2014-09-18 DIAGNOSIS — N179 Acute kidney failure, unspecified: Secondary | ICD-10-CM | POA: Diagnosis not present

## 2014-09-18 DIAGNOSIS — I959 Hypotension, unspecified: Secondary | ICD-10-CM | POA: Diagnosis not present

## 2014-09-18 DIAGNOSIS — I10 Essential (primary) hypertension: Secondary | ICD-10-CM

## 2014-09-18 DIAGNOSIS — E118 Type 2 diabetes mellitus with unspecified complications: Secondary | ICD-10-CM

## 2014-09-18 LAB — GLUCOSE, CAPILLARY
GLUCOSE-CAPILLARY: 110 mg/dL — AB (ref 65–99)
GLUCOSE-CAPILLARY: 146 mg/dL — AB (ref 65–99)
GLUCOSE-CAPILLARY: 156 mg/dL — AB (ref 65–99)
Glucose-Capillary: 119 mg/dL — ABNORMAL HIGH (ref 65–99)

## 2014-09-18 LAB — BASIC METABOLIC PANEL
ANION GAP: 9 (ref 5–15)
BUN: 32 mg/dL — ABNORMAL HIGH (ref 6–20)
CO2: 26 mmol/L (ref 22–32)
Calcium: 8.3 mg/dL — ABNORMAL LOW (ref 8.9–10.3)
Chloride: 104 mmol/L (ref 101–111)
Creatinine, Ser: 1.67 mg/dL — ABNORMAL HIGH (ref 0.44–1.00)
GFR calc Af Amer: 36 mL/min — ABNORMAL LOW (ref >60)
GFR, EST NON AFRICAN AMERICAN: 31 mL/min — AB (ref >60)
GLUCOSE: 116 mg/dL — AB (ref 65–99)
POTASSIUM: 3.8 mmol/L (ref 3.5–5.1)
SODIUM: 139 mmol/L (ref 135–145)

## 2014-09-18 LAB — CBC
HCT: 29.7 % — ABNORMAL LOW (ref 36.0–46.0)
HEMOGLOBIN: 9.2 g/dL — AB (ref 12.0–15.0)
MCH: 26.2 pg (ref 26.0–34.0)
MCHC: 31 g/dL (ref 30.0–36.0)
MCV: 84.6 fL (ref 78.0–100.0)
Platelets: 115 10*3/uL — ABNORMAL LOW (ref 150–400)
RBC: 3.51 MIL/uL — AB (ref 3.87–5.11)
RDW: 18.3 % — ABNORMAL HIGH (ref 11.5–15.5)
WBC: 2.7 10*3/uL — ABNORMAL LOW (ref 4.0–10.5)

## 2014-09-18 LAB — TROPONIN I
Troponin I: 0.29 ng/mL — ABNORMAL HIGH (ref <0.031)
Troponin I: 0.21 ng/mL — ABNORMAL HIGH (ref <0.031)

## 2014-09-18 LAB — MRSA PCR SCREENING: MRSA BY PCR: NEGATIVE

## 2014-09-18 LAB — HEPARIN LEVEL (UNFRACTIONATED): HEPARIN UNFRACTIONATED: 0.38 [IU]/mL (ref 0.30–0.70)

## 2014-09-18 MED ORDER — TECHNETIUM TO 99M ALBUMIN AGGREGATED
6.0000 | Freq: Once | INTRAVENOUS | Status: AC | PRN
  Administered 2014-09-18: 5.5 via INTRAVENOUS

## 2014-09-18 MED ORDER — ATORVASTATIN CALCIUM 10 MG PO TABS
10.0000 mg | ORAL_TABLET | Freq: Every day | ORAL | Status: DC
  Administered 2014-09-18: 10 mg via ORAL
  Filled 2014-09-18: qty 1

## 2014-09-18 MED ORDER — FUROSEMIDE 40 MG PO TABS: 40.0000 mg | ORAL_TABLET | Freq: Every day | ORAL | Status: DC

## 2014-09-18 MED ORDER — LENALIDOMIDE 10 MG PO CAPS: 10.0000 mg | ORAL_CAPSULE | Freq: Every day | ORAL | Status: DC

## 2014-09-18 MED ORDER — TECHNETIUM TC 99M DIETHYLENETRIAME-PENTAACETIC ACID
40.0000 | Freq: Once | INTRAVENOUS | Status: AC | PRN
Start: 2014-09-18 — End: 2014-09-18
  Administered 2014-09-18: 40 via RESPIRATORY_TRACT

## 2014-09-18 MED ORDER — DEXAMETHASONE 4 MG PO TABS
20.0000 mg | ORAL_TABLET | ORAL | Status: DC
  Filled 2014-09-18: qty 5

## 2014-09-18 NOTE — Progress Notes (Signed)
Nutrition Brief Note  Patient identified on the Malnutrition Screening Tool (MST) Report  Wt Readings from Last 15 Encounters:  09/17/14 306 lb 6.4 oz (138.982 kg)  04/11/14 306 lb (138.801 kg)  12/16/13 298 lb (135.172 kg)  12/06/13 298 lb 12.8 oz (135.535 kg)  10/25/13 292 lb 9.2 oz (132.71 kg)  09/20/13 291 lb (131.997 kg)  08/30/13 292 lb (132.45 kg)  08/22/13 290 lb (131.543 kg)  07/11/13 294 lb (133.358 kg)  02/26/13 287 lb (130.182 kg)  02/08/13 286 lb 4 oz (129.842 kg)  06/28/12 287 lb 9.6 oz (130.455 kg)  06/12/12 284 lb (128.822 kg)  01/12/12 266 lb (120.657 kg)  11/18/11 270 lb 6.4 oz (122.653 kg)    Body mass index is 52.57 kg/(m^2). Patient meets criteria for obesity class III based on current BMI. Progressive weight gain over past 3 years.   Current diet order is heart healthy. Labs and medications reviewed.   Recommend pt be referred for outpatient nutrition counseling  (re: weight loss) when chemotherapy treatments are completed.   Colman Cater MS,RD,CSG,LDN Office: [phone] Pager: [phone]

## 2014-09-18 NOTE — Progress Notes (Signed)
[facility] PROGRESS NOTE    Assessment/Plan: AKI (acute kidney injury)/ CKD (chronic kidney disease) stage 3, GFR 30-59 ml/min: - Likely prerenal now resolved with IV hydration. - Creatinine at baseline 1.4-1.6. - Her chronic renal disease due to multiple myeloma.  Generalized weakness/  Diabetes mellitus with complication: - D-dimer elevated, V/Q scan is low probability for PE, EKG shows normal sinus rhythm with no T-wave abnormalities is unchanged compared to previous and troponin I her mildly elevated is due to chronic renal failure. - I think her episode of generalized weakness was to to hypoglycemia although was not documented. She relates she takes her glipizide before going to bed in the setting of acute renal failure. She relates that after she consumed issues and sugar her blood glucose was only 100. - I will continue to monitor for 24 hours, for any episodes of hypoglycemia.  Chronic systolic congestive heart failure - Continue current home regimen hold Lasix for 24 hours.  HLD (hyperlipidemia) - Continue statins.  Multiple myeloma - Next chemotherapy on Thursday.  Atypical Chest pain - EKG shows normal sinus rhythm no T wave abnormalities is unchanged compared to previous, her cardiac enzymes were just barely elevated is probably due to chronic renal disease. I will stop her heparin. - D-dimer was positive and VQ scan was ordered that showed low probability for PE. - Likely due to anxiety.   Essential hypertension - Continue home regimen except for Lasix which for 24 hours.    Code Status: FULL DVT Prophylaxis: Hep Family Communication: Sister Disposition Plan: home in 24 hrs   Consultants:  none  Procedures:  Vq scan  Antibiotics:  None  HPI/Subjective: She relates her chest pain up better, no events overnight.  Objective: Filed Vitals:   09/17/14 1900 09/17/14 2200 09/17/14 2218 09/18/14 0100  BP: 106/55 96/45 120/80 130/70  Pulse:  64 63  71  Temp: 98.2 F (36.8 C) 98.1 F (36.7 C)    TempSrc: Oral Oral  Oral  Resp: $Remo'20 20  21  'Glfkk$ Height: $Rem'5\' 4"'ZVXE$  (1.626 m)     Weight: 138.982 kg (306 lb 6.4 oz)     SpO2: 100% 96%  100%    Intake/Output Summary (Last 24 hours) at 09/18/14 0928 Last data filed at 09/18/14 6222  Gross per 24 hour  Intake 1146.25 ml  Output   2300 ml  Net -1153.75 ml   Filed Weights   09/17/14 1431 09/17/14 1900  Weight: 139.708 kg (308 lb) 138.982 kg (306 lb 6.4 oz)    Exam:  General: Alert, awake, oriented x3, in no acute distress.  HEENT: No bruits, no goiter.  Heart: Regular rate and rhythm. Lungs: Good air movement, clear  Abdomen: Soft, nontender, nondistended, positive bowel sounds.  Neuro: Grossly intact, nonfocal.   Data Reviewed: Basic Metabolic Panel:  Recent Labs Lab 09/17/14 1503 09/18/14 0628  NA 137 139  K 4.3 3.8  CL 100* 104  CO2 26 26  GLUCOSE 185* 116*  BUN 35* 32*  CREATININE 1.92* 1.67*  CALCIUM 8.9 8.3*   Liver Function Tests:  Recent Labs Lab 09/17/14 1503  AST 29  ALT 43  ALKPHOS 93  BILITOT 0.7  PROT 6.7  ALBUMIN 3.5   No results for input(s): LIPASE, AMYLASE in the last 168 hours. No results for input(s): AMMONIA in the last 168 hours. CBC:  Recent Labs Lab 09/17/14 1503 09/18/14 0628  WBC 3.2* 2.7*  NEUTROABS 1.9  --   HGB 10.5* 9.2*  HCT  33.4* 29.7*  MCV 84.8 84.6  PLT 100* 115*   Cardiac Enzymes:  Recent Labs Lab 09/17/14 1503 09/17/14 1708 09/17/14 2252 09/18/14 0248 09/18/14 0628  TROPONINI 0.10* 0.32* 0.37* 0.29* 0.21*   BNP (last 3 results) No results for input(s): BNP in the last 8760 hours.  ProBNP (last 3 results)  Recent Labs  10/24/13 2037  PROBNP 522.6*    CBG:  Recent Labs Lab 09/17/14 1445 09/17/14 2207 09/18/14 0814  GLUCAP 190* 129* 110*    Recent Results (from the past 240 hour(s))  MRSA PCR Screening     Status: None   Collection Time: 09/17/14 11:10 PM  Result Value Ref Range Status    MRSA by PCR NEGATIVE NEGATIVE Final    Comment:        The GeneXpert MRSA Assay (FDA approved for NASAL specimens only), is one component of a comprehensive MRSA colonization surveillance program. It is not intended to diagnose MRSA infection nor to guide or monitor treatment for MRSA infections.      Studies: Nm Pulmonary Perf And Vent  09/18/2014   CLINICAL DATA:  Chest pain.  Fall yesterday.  EXAM: NUCLEAR MEDICINE VENTILATION - PERFUSION LUNG SCAN  TECHNIQUE: Ventilation images were obtained in multiple projections using inhaled aerosol Tc-63m DTPA. Perfusion images were obtained in multiple projections after intravenous injection of Tc-89m MAA.  RADIOPHARMACEUTICALS:  40 mCi Technetium-59m DTPA aerosol inhalation and 5 mCi Technetium-39m MAA IV  COMPARISON:  Chest x-ray 09/17/2014  FINDINGS: Ventilation: No focal ventilation defect.  Perfusion: Patchy nonsegmental perfusion defects. No segmental or subsegmental defects to suggest pulmonary emboli  IMPRESSION: Low probability study for pulmonary embolus.   Electronically Signed   By: Rolm Baptise M.D.   On: 09/18/2014 08:01   Dg Chest Port 1 View  09/17/2014   CLINICAL DATA:  Chest pain.  EXAM: PORTABLE CHEST - 1 VIEW  COMPARISON:  None.  FINDINGS: Mild cardiomegaly is noted. No pneumothorax is noted. Mild central pulmonary vascular congestion is noted. Bibasilar opacities are noted, but is uncertain if this is due to overlying soft tissue and body habitus. Visualized bony thorax appears intact.  IMPRESSION: Bibasilar opacities are noted which may be due to overlying soft tissue artifact due to patient's body habitus, but bibasilar edema or atelectasis cannot be excluded. Mild central pulmonary vascular congestion is noted.   Electronically Signed   By: Marijo Conception, M.D.   On: 09/17/2014 15:08    Scheduled Meds: . sodium chloride   Intravenous STAT  . aspirin EC  81 mg Oral QHS  . atorvastatin  10 mg Oral q1800  . carvedilol  25  mg Oral BID WC  . dexamethasone  20 mg Oral Weekly  . feeding supplement (ENSURE ENLIVE)  237 mL Oral BID BM  . furosemide  40 mg Oral QHS  . insulin aspart  0-20 Units Subcutaneous TID WC  . insulin aspart  0-5 Units Subcutaneous QHS  . lenalidomide  10 mg Oral Daily  . levothyroxine  100 mcg Oral QAC breakfast  . sodium chloride  3 mL Intravenous Q12H   Continuous Infusions: . sodium chloride 100 mL/hr at 09/18/14 0805  . heparin 1,250 Units/hr (09/17/14 1855)    Time Spent: 25 min   Charlynne Cousins  [facility] Pager [phone]. If 7PM-7AM, please contact night-coverage at [email or web address], password [facility] 09/18/2014, 9:28 AM

## 2014-09-18 NOTE — Progress Notes (Signed)
ANTICOAGULATION CONSULT NOTE - follow up  Pharmacy Consult for Heparin Indication: chest pain/ACS  Allergies  Allergen Reactions  . Doxycycline Rash   Patient Measurements: Height: 5\' 4"  (162.6 cm) Weight: (!) 306 lb 6.4 oz (138.982 kg) IBW/kg (Calculated) : 54.7 HEPARIN DW (KG): 89.6  Vital Signs: Temp: 98.1 F (36.7 C) (06/28 2200) Temp Source: Oral (06/29 0100) BP: 130/70 mmHg (06/29 0100) Pulse Rate: 71 (06/29 0100)  Labs:  Recent Labs  09/17/14 1503  09/17/14 2252 09/18/14 0248 09/18/14 0628  HGB 10.5*  --   --   --  9.2*  HCT 33.4*  --   --   --  29.7*  PLT 100*  --   --   --  115*  APTT 24  --   --   --   --   LABPROT 16.1*  --   --   --   --   INR 1.28  --   --   --   --   HEPARINUNFRC  --   --   --  0.38  --   CREATININE 1.92*  --   --   --  1.67*  TROPONINI 0.10*  < > 0.37* 0.29* 0.21*  < > = values in this interval not displayed.  Estimated Creatinine Clearance: 45.6 mL/min (by C-G formula based on Cr of 1.67).  Medical History: Past Medical History  Diagnosis Date  . Systolic heart failure     Chronic  . Overweight(278.02)   . Cardiomyopathy secondary   . Hypertension     takes Diovan and Spirololactone daily  . Dysrhythmia     pt takes Digoxin and Carvedilol daily  . Sleep apnea     doesn't use CPAP  . Pneumonia     hx of;many yrs ago  . Peripheral neuropathy   . Arthritis     ankles  . Gout     hx of x 1 time in Aug 2012  . GERD (gastroesophageal reflux disease)     takes Protonix seldom  . H/O hiatal hernia   . Hemorrhoid   . Anemia     takes Ferrous Fumarate 2 times week  . Blood transfusion     hx of-79yrs ago  . Thyroid nodule     right  . Diabetes mellitus     takes Glipizide and Metformin daily  . Cataract     bilateral and immature  . Left leg pain   . Chronic back pain   . Multiple myeloma     IgA myeloma; chemo at [facility] Onc [facility] at [facility]  . CHF (congestive heart failure)     takes Lasix prn, Echo 2015 with EF  40%   Medications:  Prescriptions prior to admission  Medication Sig Dispense Refill Last Dose  . acyclovir (ZOVIRAX) 200 MG capsule Take 200 mg by mouth 2 (two) times daily.   09/17/2014 at Unknown time  . Ascorbic Acid (VITAMIN C PO) Take 1 tablet by mouth daily.   09/17/2014 at Unknown time  . aspirin EC 81 MG tablet Take 81 mg by mouth at bedtime.   09/16/2014 at Unknown time  . atorvastatin (LIPITOR) 10 MG tablet Take 10 mg by mouth daily at 6 PM.    09/16/2014 at Unknown time  . carvedilol (COREG) 25 MG tablet Take 1 tablet (25 mg total) by mouth 2 (two) times daily. 60 tablet 11 09/17/2014 at 800  . CINNAMON PO Take 2 capsules by mouth every morning.  09/17/2014 at Unknown time  . Cyanocobalamin (VITAMIN B-12 PO) Take 1 tablet by mouth daily.   09/17/2014 at Unknown time  . Flaxseed, Linseed, (FLAX SEED OIL PO) Take 1 capsule by mouth every morning.    09/17/2014 at Unknown time  . furosemide (LASIX) 40 MG tablet Take 1 tablet (40 mg total) by mouth daily. For fluid retention (Patient taking differently: Take 40 mg by mouth at bedtime. For fluid retention) 30 tablet 1 09/16/2014 at Unknown time  . glipiZIDE (GLUCOTROL) 5 MG tablet Take 5 mg by mouth every evening.    09/16/2014 at Unknown time  . HUMALOG KWIKPEN 100 UNIT/ML KiwkPen Take 5-12 Units by mouth 3 (three) times daily.   09/17/2014 at Unknown time  . HYDROcodone-acetaminophen (NORCO) 5-325 MG per tablet Take 1-2 tablets by mouth every 4 (four) hours as needed. (Patient taking differently: Take 1-2 tablets by mouth every 4 (four) hours as needed (pain). ) 20 tablet 0 09/16/2014 at Unknown time  . levothyroxine (SYNTHROID, LEVOTHROID) 100 MCG tablet Take 100 mcg by mouth daily before breakfast.   09/17/2014 at Unknown time  . REVLIMID 10 MG capsule Take 10 mg by mouth daily.   09/17/2014 at Unknown time  . spironolactone (ALDACTONE) 25 MG tablet Take 12.5 mg by mouth daily as needed (fluid).    Past Week at Unknown time  . TRADJENTA 5 MG TABS  tablet Take 5 mg by mouth daily.    09/17/2014 at Unknown time  . dexamethasone (DECADRON) 4 MG tablet Take 20 mg by mouth once a week. On Wednesday   09/11/2014  . tetrahydrozoline (EYE DROPS) 0.05 % ophthalmic solution Place 1 drop into both eyes daily as needed. For dry eye relief   unknown   Assessment: Okay for Protocol, recent blood transfusion at [facility].  Thrombocytopenia on admission noted (100K), improved some today (115). 148K 5 months ago. Initial Heparin level is therapeutic.    Goal of Therapy:  Heparin level 0.3-0.7 units/ml Monitor platelets by anticoagulation protocol: Yes   Plan:  Continue heparin infusion at 1250 units/hr Check anti-Xa level in 6-8 hours and daily while on heparin Continue to monitor H&H and platelets  Hart Robinsons A 09/18/2014,8:09 AM

## 2014-09-18 NOTE — Care Management Note (Signed)
Case Management Note  Patient Details  Name: [name] MRN: [id number] Date of Birth: December 06, 1945  Expected Discharge Date:                  Expected Discharge Plan:  Home/Self Care  In-House Referral:  NA  Discharge planning Services  CM Consult  Post Acute Care Choice:  NA Choice offered to:  NA  DME Arranged:    DME Agency:     HH Arranged:    [place] Agency:     Status of Service:  Completed, signed off  Medicare Important Message Given:    Date Medicare IM Given:    Medicare IM give by:    Date Additional Medicare IM Given:    Additional Medicare Important Message give by:     If discussed at [place] of Stay Meetings, dates discussed:    Additional Comments: Pt is from home and independent at baseline. Pt has no HH services or DME's prior to admission. Pt plans to discharge home with self care. No CM needs anticipated. Pt given Medicare OBS notification, patient signed, copy given to patient and original placed on patient's shadow chart. Discharge anticipated in 24 hours.  Sherald Barge, RN 09/18/2014, 11:34 AM

## 2014-09-18 NOTE — Evaluation (Signed)
Physical Therapy Evaluation Patient Details Name: [name] MRN: [id number] DOB: 1945-09-08 Today's Date: 09/18/2014   History of Present Illness  Pt states that she felt week and fell in the kitchen. Legs "were rubbery." Unable to stand alone. Home CBG >100 at that time. On floor for 15-20 min before able to stand. This made pt very anxious and she then developed chest "fullness." Improved w/ rest and improvement in anxiety. Denies lightheadedness, HA, palpitations, SOB, radiation, nausea, leg swelling above baseline, diaphoresis. Last Chemotherapy 7 days ago [facility]).   Clinical Impression  Pt is I with mobility.  Lives in a handicapped apartment.  Pt appears to be at her prior level of functioning.     Follow Up Recommendations   none   Equipment Recommendations    none   Recommendations for Other Services   none    Precautions / Restrictions   none     Mobility  Bed Mobility Overal bed mobility: Independent                Transfers Overall transfer level: Independent                  Ambulation/Gait Ambulation/Gait assistance: Independent Ambulation Distance (Feet): 150 Feet Assistive device: None Gait Pattern/deviations: WFL(Within Functional Limits)   Gait velocity interpretation: at or above normal speed for age/gender                 Pertinent Vitals/Pain Pain Assessment: No/denies pain    Home Living Family/patient expects to be discharged to:: Private residence Living Arrangements: Alone Available Help at Discharge: Family Type of Home: Apartment Home Access: Level entry     Home Layout: One level Home Equipment: Environmental consultant - 2 wheels;Cane - single point      Prior Function Level of Independence: Independent               Hand Dominance   Dominant Hand: Right    Extremity/Trunk Assessment   Upper Extremity Assessment: Overall WFL for tasks assessed           Lower Extremity Assessment:  Overall WFL for tasks assessed         Communication   Communication: No difficulties  Cognition Arousal/Alertness: Awake/alert   Overall Cognitive Status: Within Functional Limits for tasks assessed                               Assessment/Plan    PT Assessment Patient needs continued PT services                           Barriers to discharge  none         End of Session Equipment Utilized During Treatment: Gait belt Activity Tolerance: Patient tolerated treatment well Patient left: in chair;with call bell/phone within reach;with family/visitor present      Functional Limitation: Mobility: Walking and moving around Mobility: Walking and Moving Around Current Status [id number]): At least 1 percent but less than 20 percent impaired, limited or restricted Mobility: Walking and Moving Around Goal Status [phone]): At least 1 percent but less than 20 percent impaired, limited or restricted Mobility: Walking and Moving Around Discharge Status [phone]): At least 1 percent but less than 20 percent impaired, limited or restricted    Time: 1125-1140 PT Time Calculation (min) (ACUTE ONLY): 15 min   Charges:   PT Evaluation $Initial PT Evaluation  Tier I: 1 Procedure     PT G Codes:   PT G-Codes **NOT FOR INPATIENT CLASS** Functional Limitation: Mobility: Walking and moving around Mobility: Walking and Moving Around Current Status [id number]): At least 1 percent but less than 20 percent impaired, limited or restricted Mobility: Walking and Moving Around Goal Status [phone]): At least 1 percent but less than 20 percent impaired, limited or restricted Mobility: Walking and Moving Around Discharge Status [phone]): At least 1 percent but less than 20 percent impaired, limited or restricted  Rayetta Humphrey, PT CLT [phone] 09/18/2014, 11:46 AM

## 2014-09-18 NOTE — Progress Notes (Signed)
OT Screen  Patient Details Name: [name] MRN: [id number] DOB: Jul 04, 1945   OT Screen:    Reason evaluation not completed: Pt screened for OT needs. Pt is independent in all B/IADL tasks. Pt is at baseline with function, no further acute OT needs.   Guadelupe Sabin, OTR/L  [phone]  09/18/2014, 4:15 PM

## 2014-09-19 DIAGNOSIS — N179 Acute kidney failure, unspecified: Secondary | ICD-10-CM | POA: Diagnosis not present

## 2014-09-19 DIAGNOSIS — I5022 Chronic systolic (congestive) heart failure: Secondary | ICD-10-CM | POA: Diagnosis not present

## 2014-09-19 DIAGNOSIS — S29001A Unspecified injury of muscle and tendon of front wall of thorax, initial encounter: Secondary | ICD-10-CM | POA: Diagnosis not present

## 2014-09-19 DIAGNOSIS — R0789 Other chest pain: Secondary | ICD-10-CM | POA: Diagnosis not present

## 2014-09-19 DIAGNOSIS — N183 Chronic kidney disease, stage 3 (moderate): Secondary | ICD-10-CM | POA: Diagnosis not present

## 2014-09-19 LAB — CBC
HEMATOCRIT: 29.1 % — AB (ref 36.0–46.0)
Hemoglobin: 9.1 g/dL — ABNORMAL LOW (ref 12.0–15.0)
MCH: 26.6 pg (ref 26.0–34.0)
MCHC: 31.3 g/dL (ref 30.0–36.0)
MCV: 85.1 fL (ref 78.0–100.0)
Platelets: 117 10*3/uL — ABNORMAL LOW (ref 150–400)
RBC: 3.42 MIL/uL — ABNORMAL LOW (ref 3.87–5.11)
RDW: 18.2 % — ABNORMAL HIGH (ref 11.5–15.5)
WBC: 2.8 10*3/uL — ABNORMAL LOW (ref 4.0–10.5)

## 2014-09-19 LAB — GLUCOSE, CAPILLARY
GLUCOSE-CAPILLARY: 108 mg/dL — AB (ref 65–99)
Glucose-Capillary: 137 mg/dL — ABNORMAL HIGH (ref 65–99)

## 2014-09-19 NOTE — Care Management Note (Signed)
Case Management Note  Patient Details  Name: [name] MRN: [id number] Date of Birth: [birth year]  Subjective/Objective:                    Action/Plan:   Expected Discharge Date:                  Expected Discharge Plan:  Home/Self Care  In-House Referral:  NA  Discharge planning Services  CM Consult  Post Acute Care Choice:  NA Choice offered to:  NA  DME Arranged:    DME Agency:     HH Arranged:    HH Agency:     Status of Service:  Completed, signed off  Medicare Important Message Given:    Date Medicare IM Given:    Medicare IM give by:    Date Additional Medicare IM Given:    Additional Medicare Important Message give by:     If discussed at [place] of Stay Meetings, dates discussed:    Additional Comments: Pt discharged home today. No CM needs noted. Victoria Gully [name], RN 09/19/2014, 9:07 AM

## 2014-09-19 NOTE — Discharge Summary (Signed)
Physician Discharge Summary  [name]:[id number] DOB: Oct 17, 1945 DOA: 09/17/2014  PCP: Maggie Font, MD  Admit date: 09/17/2014 Discharge date: 09/19/2014  Time spent: 35 minutes  Recommendations for Outpatient Follow-up:  1. Follow up with endo in 1 week.  Discharge Diagnoses:  Active Problems:   Essential hypertension   CKD (chronic kidney disease) stage 3, GFR 30-59 ml/min   Hypotension   Chronic systolic congestive heart failure   AKI (acute kidney injury)   Diabetes mellitus with complication   HLD (hyperlipidemia)   Multiple myeloma   Chest pain   Discharge Condition: stable  Diet recommendation: ADA  Filed Weights   09/17/14 1431 09/17/14 1900 09/19/14 0634  Weight: 139.708 kg (308 lb) 138.982 kg (306 lb 6.4 oz) 139.164 kg (306 lb 12.8 oz)    History of present illness:  69 y.o. female   Pt states that she felt week and fell in the kitchen. Legs "were rubbery." Unable to stand alone. Home CBG >100 at that time. On floor for 15-20 min before able to stand. This made pt very anxious and she then developed chest "fullness." Improved w/ rest and improvement in anxiety. Denies lightheadedness, HA, palpitations, SOB, radiation, nausea, leg swelling above baseline, diaphoresis. Last Chemotherapy 7 days ago [facility]).  Received a blood transfusion 5 days ago.   Hospital Course:  AKI (acute kidney injury)/ CKD (chronic kidney disease) stage 3, GFR 30-59 ml/min: - Likely prerenal now resolved with IV hydration. - Creatinine at baseline 1.4-1.6. - Her chronic renal disease due to multiple myeloma.  Generalized weakness/ Diabetes mellitus with complication: - D-dimer elevated, V/Q scan is low probability for PE, EKG shows normal sinus rhythm with no T-wave abnormalities is unchanged compared to previous and troponin I her mildly elevated is due to chronic renal failure. - I think her episode of generalized weakness was to to hypoglycemia although  was not documented. She relates she takes her glipizide before going to bed in the setting of acute renal failure. She relates that after she consumed orange juice her r blood glucose was only 100.  Chronic systolic congestive heart failure - no changes made to her medications.  HLD (hyperlipidemia) - Continue statins.  Multiple myeloma - Next chemotherapy on Thursday.  Atypical Chest pain - EKG shows normal sinus rhythm no T wave abnormalities is unchanged compared to previous, her cardiac enzymes were just barely elevated is probably due to chronic renal disease. I will stop her heparin. - D-dimer was positive and VQ scan was ordered that showed low probability for PE. - Likely due to anxiety.  Essential hypertension - Continue home regimen except for Lasix which for 24 hours.   Procedures:  VQ scan  Consultations:  none  Discharge Exam: Filed Vitals:   09/19/14 0634  BP: 140/72  Pulse: 65  Temp: 98.2 F (36.8 C)  Resp: 20    General: A&O x3 Cardiovascular: RRR Respiratory: good air movement CTA B/L  Discharge Instructions   Discharge Instructions    Diet - low sodium heart healthy    Complete by:  As directed      Increase activity slowly    Complete by:  As directed           Current Discharge Medication List    CONTINUE these medications which have NOT CHANGED   Details  acyclovir (ZOVIRAX) 200 MG capsule Take 200 mg by mouth 2 (two) times daily.    Ascorbic Acid (VITAMIN C PO) Take 1 tablet by  mouth daily.    aspirin EC 81 MG tablet Take 81 mg by mouth at bedtime.    atorvastatin (LIPITOR) 10 MG tablet Take 10 mg by mouth daily at 6 PM.     carvedilol (COREG) 25 MG tablet Take 1 tablet (25 mg total) by mouth 2 (two) times daily. Qty: 60 tablet, Refills: 11   Associated Diagnoses: Chronic systolic heart failure    CINNAMON PO Take 2 capsules by mouth every morning.     Cyanocobalamin (VITAMIN B-12 PO) Take 1 tablet by mouth daily.     Flaxseed, Linseed, (FLAX SEED OIL PO) Take 1 capsule by mouth every morning.     furosemide (LASIX) 40 MG tablet Take 1 tablet (40 mg total) by mouth daily. For fluid retention Qty: 30 tablet, Refills: 1    glipiZIDE (GLUCOTROL) 5 MG tablet Take 5 mg by mouth every evening.     HUMALOG KWIKPEN 100 UNIT/ML KiwkPen Take 5-12 Units by mouth 3 (three) times daily.    HYDROcodone-acetaminophen (NORCO) 5-325 MG per tablet Take 1-2 tablets by mouth every 4 (four) hours as needed. Qty: 20 tablet, Refills: 0    levothyroxine (SYNTHROID, LEVOTHROID) 100 MCG tablet Take 100 mcg by mouth daily before breakfast.    REVLIMID 10 MG capsule Take 10 mg by mouth daily.    spironolactone (ALDACTONE) 25 MG tablet Take 12.5 mg by mouth daily as needed (fluid).     TRADJENTA 5 MG TABS tablet Take 5 mg by mouth daily.     dexamethasone (DECADRON) 4 MG tablet Take 20 mg by mouth once a week. On Wednesday    tetrahydrozoline (EYE DROPS) 0.05 % ophthalmic solution Place 1 drop into both eyes daily as needed. For dry eye relief       Allergies  Allergen Reactions  . Doxycycline Rash   Follow-up Information    Follow up with HILL,GERALD K, MD In 1 week.   Specialty:  Family Medicine   Contact information:   [address] [place] [address] [phone]        The results of significant diagnostics from this hospitalization (including imaging, microbiology, ancillary and laboratory) are listed below for reference.    Significant Diagnostic Studies: Nm Pulmonary Perf And Vent  09/18/2014   CLINICAL DATA:  Chest pain.  Fall yesterday.  EXAM: NUCLEAR MEDICINE VENTILATION - PERFUSION LUNG SCAN  TECHNIQUE: Ventilation images were obtained in multiple projections using inhaled aerosol Tc-37m DTPA. Perfusion images were obtained in multiple projections after intravenous injection of Tc-70m MAA.  RADIOPHARMACEUTICALS:  40 mCi Technetium-68m DTPA aerosol inhalation and 5 mCi Technetium-64m MAA IV   COMPARISON:  Chest x-ray 09/17/2014  FINDINGS: Ventilation: No focal ventilation defect.  Perfusion: Patchy nonsegmental perfusion defects. No segmental or subsegmental defects to suggest pulmonary emboli  IMPRESSION: Low probability study for pulmonary embolus.   Electronically Signed   By: Rolm Baptise M.D.   On: 09/18/2014 08:01   Dg Chest Port 1 View  09/17/2014   CLINICAL DATA:  Chest pain.  EXAM: PORTABLE CHEST - 1 VIEW  COMPARISON:  None.  FINDINGS: Mild cardiomegaly is noted. No pneumothorax is noted. Mild central pulmonary vascular congestion is noted. Bibasilar opacities are noted, but is uncertain if this is due to overlying soft tissue and body habitus. Visualized bony thorax appears intact.  IMPRESSION: Bibasilar opacities are noted which may be due to overlying soft tissue artifact due to patient's body habitus, but bibasilar edema or atelectasis cannot be excluded. Mild  central pulmonary vascular congestion is noted.   Electronically Signed   By: Marijo Conception, M.D.   On: 09/17/2014 15:08    Microbiology: Recent Results (from the past 240 hour(s))  MRSA PCR Screening     Status: None   Collection Time: 09/17/14 11:10 PM  Result Value Ref Range Status   MRSA by PCR NEGATIVE NEGATIVE Final    Comment:        The GeneXpert MRSA Assay (FDA approved for NASAL specimens only), is one component of a comprehensive MRSA colonization surveillance program. It is not intended to diagnose MRSA infection nor to guide or monitor treatment for MRSA infections.      Labs: Basic Metabolic Panel:  Recent Labs Lab 09/17/14 1503 09/18/14 0628  NA 137 139  K 4.3 3.8  CL 100* 104  CO2 26 26  GLUCOSE 185* 116*  BUN 35* 32*  CREATININE 1.92* 1.67*  CALCIUM 8.9 8.3*   Liver Function Tests:  Recent Labs Lab 09/17/14 1503  AST 29  ALT 43  ALKPHOS 93  BILITOT 0.7  PROT 6.7  ALBUMIN 3.5   No results for input(s): LIPASE, AMYLASE in the last 168 hours. No results for input(s):  AMMONIA in the last 168 hours. CBC:  Recent Labs Lab 09/17/14 1503 09/18/14 0628 09/19/14 0557  WBC 3.2* 2.7* 2.8*  NEUTROABS 1.9  --   --   HGB 10.5* 9.2* 9.1*  HCT 33.4* 29.7* 29.1*  MCV 84.8 84.6 85.1  PLT 100* 115* 117*   Cardiac Enzymes:  Recent Labs Lab 09/17/14 1503 09/17/14 1708 09/17/14 2252 09/18/14 0248 09/18/14 0628  TROPONINI 0.10* 0.32* 0.37* 0.29* 0.21*   BNP: BNP (last 3 results) No results for input(s): BNP in the last 8760 hours.  ProBNP (last 3 results)  Recent Labs  10/24/13 2037  PROBNP 522.6*    CBG:  Recent Labs Lab 09/18/14 0814 09/18/14 1115 09/18/14 1625 09/18/14 2226 09/19/14 0746  GLUCAP 110* 146* 119* 156* 108*       Signed:  Charlynne Cousins  [facility] 09/19/2014, 8:13 AM

## 2014-09-19 NOTE — Progress Notes (Signed)
Pt discharged home today per Dr. Aileen Fass.  Pt's IV site D/C'd and WDL.  Pt's VSS.  Pt provided with home medication list and discharge instructions.  Verbalized understanding.  Pt left floor via WC in stable condition accompanied by RN.

## 2014-09-25 DIAGNOSIS — C9000 Multiple myeloma not having achieved remission: Secondary | ICD-10-CM | POA: Diagnosis not present

## 2014-10-02 DIAGNOSIS — C9000 Multiple myeloma not having achieved remission: Secondary | ICD-10-CM | POA: Diagnosis not present

## 2014-10-16 DIAGNOSIS — D61818 Other pancytopenia: Secondary | ICD-10-CM | POA: Diagnosis not present

## 2014-10-16 DIAGNOSIS — N289 Disorder of kidney and ureter, unspecified: Secondary | ICD-10-CM | POA: Diagnosis not present

## 2014-10-16 DIAGNOSIS — C9000 Multiple myeloma not having achieved remission: Secondary | ICD-10-CM | POA: Diagnosis not present

## 2014-10-17 ENCOUNTER — Ambulatory Visit (INDEPENDENT_AMBULATORY_CARE_PROVIDER_SITE_OTHER): Payer: Medicare Other | Admitting: Cardiology

## 2014-10-17 ENCOUNTER — Encounter: Payer: Self-pay | Admitting: Cardiology

## 2014-10-17 VITALS — BP 122/70 | HR 67 | Ht 64.0 in | Wt 310.0 lb

## 2014-10-17 DIAGNOSIS — I5022 Chronic systolic (congestive) heart failure: Secondary | ICD-10-CM | POA: Diagnosis not present

## 2014-10-17 DIAGNOSIS — I10 Essential (primary) hypertension: Secondary | ICD-10-CM | POA: Diagnosis not present

## 2014-10-17 NOTE — Progress Notes (Signed)
Patient ID: [name], female   DOB: June 01, 1945, 69 y.o.   MRN: [id number]     Clinical Summary [name] is a 69 y.o.female seen today for follow up of the following medical problems.   1. NICM  - echo 07/2013 LVEF 40-45%, mod to severe LVH.  - previously had ICD, had troubles with lead dislodgment and inappropriate shocks in the past and device was removed. Her LVEF has improved and she has not required consideration for another device.    - limiting sodium intake, no NSAIDs - denies any SOB or DOE. Notes some LE edema at times  2. HTN   - does not check regularly at home - compliant with meds  3. Dizziness/Palpitations  - symptoms have resolved since cutting back on meds  4. Fall - admitted late 08/2014 after a fall at home. Found to be dehydrated by labs, resolved with IVF. Cr 1.9 to 1.7 - no similar symptoms since discharge  5. Multiple myeloma - followed by Dr Tressie Stalker in [place] and Dr Melba Coon at [facility].  - currently on acyclovir, decadron, and revlimid.   7. CKD - followed by renal Dr Hinda Lenis, appt in October - reports weekly labs at Dr Karen Kitchens, was told Cr trending up  8. DM2 - followed by endocrine.  Past Medical History  Diagnosis Date  . Systolic heart failure     Chronic  . Overweight(278.02)   . Cardiomyopathy secondary   . Hypertension     takes Diovan and Spirololactone daily  . Dysrhythmia     pt takes Digoxin and Carvedilol daily  . Sleep apnea     doesn't use CPAP  . Pneumonia     hx of;many yrs ago  . Peripheral neuropathy   . Arthritis     ankles  . Gout     hx of x 1 time in Aug 2012  . GERD (gastroesophageal reflux disease)     takes Protonix seldom  . H/O hiatal hernia   . Hemorrhoid   . Anemia     takes Ferrous Fumarate 2 times week  . Blood transfusion     hx of-73yr ago  . Thyroid nodule     right  . Diabetes mellitus     takes Glipizide and Metformin daily  . Cataract     bilateral and immature  .  Left leg pain   . Chronic back pain   . Multiple myeloma     [facility]; [name] . CHF (congestive heart failure)     takes Lasix prn, Echo 2015 with EF 40%     Allergies  Allergen Reactions  . Doxycycline Rash     Current Outpatient Prescriptions  Medication Sig Dispense Refill  . acyclovir (ZOVIRAX) 200 MG capsule Take 200 mg by mouth 2 (two) times daily.    . Ascorbic Acid (VITAMIN C PO) Take 1 tablet by mouth daily.    .Marland Kitchenaspirin EC 81 MG tablet Take 81 mg by mouth at bedtime.    .Marland Kitchenatorvastatin (LIPITOR) 10 MG tablet Take 10 mg by mouth daily at 6 PM.     . carvedilol (COREG) 25 MG tablet Take 1 tablet (25 mg total) by mouth 2 (two) times daily. 60 tablet 11  . CINNAMON PO Take 2 capsules by mouth every morning.     . Cyanocobalamin (VITAMIN B-12 PO) Take 1 tablet by mouth daily.    .Marland Kitchendexamethasone (DECADRON) 4 MG  tablet Take 20 mg by mouth once a week. On Wednesday    . Flaxseed, Linseed, (FLAX SEED OIL PO) Take 1 capsule by mouth every morning.     . furosemide (LASIX) 40 MG tablet Take 1 tablet (40 mg total) by mouth daily. For fluid retention (Patient taking differently: Take 40 mg by mouth at bedtime. For fluid retention) 30 tablet 1  . glipiZIDE (GLUCOTROL) 5 MG tablet Take 5 mg by mouth every evening.     [name] HUMALOG KWIKPEN 100 UNIT/ML KiwkPen Take 5-12 Units by mouth 3 (three) times daily.    [name] HYDROcodone-acetaminophen (NORCO) 5-325 MG per tablet Take 1-2 tablets by mouth every 4 (four) hours as needed. (Patient taking differently: Take 1-2 tablets by mouth every 4 (four) hours as needed (pain). ) 20 tablet 0  . levothyroxine (SYNTHROID, LEVOTHROID) 100 MCG tablet Take 100 mcg by mouth daily before breakfast.    . REVLIMID 10 MG capsule Take 10 mg by mouth daily.    [name] spironolactone (ALDACTONE) 25 MG tablet Take 12.5 mg by mouth daily as needed (fluid).     [name] tetrahydrozoline (EYE DROPS) 0.05 % ophthalmic solution Place 1 drop into both eyes  daily as needed. For dry eye relief    . TRADJENTA 5 MG TABS tablet Take 5 mg by mouth daily.      No current facility-administered medications for this visit.     Past Surgical History  Procedure Laterality Date  . Cardiac defirillator removed      removed <51month;removed d/t shocking pt 15times;pierced sac of heart 1000cc ;drained  . Stomach stapling surgery  80's  . Appendectomy  1991  . Tummy tuck  1991  . Leg lipoma    . Abdominal hysterectomy  1991  . Tubal ligation  1971  . Ovarian cyst removed  1991  . Cholecystectomy open  1992  . Colonoscopy    . Thyroid lobectomy  07/27/2011    Procedure: THYROID LOBECTOMY;  Surgeon: EGayland Curry MD,FACS;  [place]  Service: General;  Laterality: Right;  . Cardiac defibrillator placement  >334yrago  . Defibrillator removed    . Colonoscopy N/A 02/08/2014    Procedure: COLONOSCOPY;  Surgeon: SaDanie BinderMD;  [facility];  Service: Endoscopy;  Laterality: N/A;  8:30AM  . Esophagogastroduodenoscopy N/A 02/08/2014    Procedure: ESOPHAGOGASTRODUODENOSCOPY (EGD);  Surgeon: SaDanie BinderMD;  Location: [facility];  Service: Endoscopy;  Laterality: N/A;     Allergies  Allergen Reactions  . Doxycycline Rash      Family History  Problem Relation Age of Onset  . Anesthesia problems Neg Hx   . Hypotension Neg Hx   . Malignant hyperthermia Neg Hx   . Pseudochol deficiency Neg Hx   . Heart failure Other      Social History [name] that she has never smoked. She has never used smokeless tobacco. [name] that she drinks alcohol.   Review of Systems CONSTITUTIONAL: No weight loss, fever, chills, weakness or fatigue.  HEENT: Eyes: No visual loss, blurred vision, double vision or yellow sclerae.No hearing loss, sneezing, congestion, runny nose or sore throat.  SKIN: No rash or itching.  CARDIOVASCULAR: per HPI RESPIRATORY: No shortness of breath, cough or sputum.    GASTROINTESTINAL: No anorexia, nausea, vomiting or diarrhea. No abdominal pain or blood.  GENITOURINARY: No burning on urination, no polyuria NEUROLOGICAL: No headache, dizziness, syncope, paralysis, ataxia, numbness or tingling in the extremities.  No change in bowel or bladder control.  MUSCULOSKELETAL: No muscle, back pain, joint pain or stiffness.  LYMPHATICS: No enlarged nodes. No history of splenectomy.  PSYCHIATRIC: No history of depression or anxiety.  ENDOCRINOLOGIC: No reports of sweating, cold or heat intolerance. No polyuria or polydipsia.  [name]   Physical Examination Filed Vitals:   10/17/14 0925  BP: 122/70  Pulse: 67   Filed Vitals:   10/17/14 0925  Height: _0  (1.626 m)  Weight: 310 lb (140.615 kg)    Gen: resting comfortably, no acute distress HEENT: no scleral icterus, pupils equal round and reactive, no palptable cervical adenopathy,  CV: RRR, no m/r/g, no JVD Resp: Clear to auscultation bilaterally GI: abdomen is soft, non-tender, non-distended, normal bowel sounds, no hepatosplenomegaly MSK: extremities are warm, 1+ bilateral LE edema  Skin: warm, no rash Neuro:  no focal deficits Psych: appropriate affect   Diagnostic Studies  08/07/13 Echo  Study Conclusions  - Procedure narrative: Transthoracic echocardiography. Image quality was suboptimal. The study was technically difficult, as a result of poor sound wave transmission and body habitus. - Left ventricle: The cavity size was normal. Wall thickness was increased in a pattern of moderate to severe LVH. Systolic function was mildly to moderately reduced. The estimated ejection fraction was in the range of 40% to 45%. Mild to moderate global hypokinesis was seen. There was an increased relative contribution of atrial contraction to ventricular filling. Doppler parameters are consistent with abnormal left ventricular relaxation (grade 1 diastolic dysfunction). - Aortic valve: Mildly calcified  annulus. Trileaflet. - Left atrium: The atrium was mildly dilated. - Right atrium: The atrium was mildly dilated.   07/2013 Event Monitor NSR, frequent PVCs   Assessment and Plan   1. NICM/Chronic systolic heart failure - echo 07/2013 LVEF 40-45%, NYHA II.  - continue current medical therapy - now on revlimid and decadornfor her myltiple myeloma, notes some increased edema at times with weight gain. Counseled ok to take additional lasix as needed.  - request most recent labs and notes from her oncologist, specifically her most recent kidney function.   2. HTN - continue current meds, at goal  F/u 1 month      Arnoldo Lenis, M.D.

## 2014-10-17 NOTE — Patient Instructions (Signed)
Your physician recommends that you schedule a follow-up appointment in: 1 month with Dr.Branch     Your physician recommends that you continue on your current medications as directed. Please refer to the Current Medication list given to you today.     Thank you for choosing [facility] !         

## 2014-10-20 ENCOUNTER — Encounter (HOSPITAL_COMMUNITY): Payer: Self-pay | Admitting: Emergency Medicine

## 2014-10-20 ENCOUNTER — Emergency Department (HOSPITAL_COMMUNITY): Payer: Medicare Other

## 2014-10-20 ENCOUNTER — Emergency Department (HOSPITAL_COMMUNITY)
Admission: EM | Admit: 2014-10-20 | Discharge: 2014-10-20 | Disposition: A | Discharge status: Home / Self Care | Payer: Medicare Other | Attending: Emergency Medicine | Admitting: Emergency Medicine

## 2014-10-20 DIAGNOSIS — Z8719 Personal history of other diseases of the digestive system: Secondary | ICD-10-CM | POA: Diagnosis not present

## 2014-10-20 DIAGNOSIS — M79604 Pain in right leg: Secondary | ICD-10-CM

## 2014-10-20 DIAGNOSIS — Y9389 Activity, other specified: Secondary | ICD-10-CM | POA: Insufficient documentation

## 2014-10-20 DIAGNOSIS — E119 Type 2 diabetes mellitus without complications: Secondary | ICD-10-CM | POA: Diagnosis not present

## 2014-10-20 DIAGNOSIS — I10 Essential (primary) hypertension: Secondary | ICD-10-CM | POA: Diagnosis not present

## 2014-10-20 DIAGNOSIS — Z7982 Long term (current) use of aspirin: Secondary | ICD-10-CM | POA: Insufficient documentation

## 2014-10-20 DIAGNOSIS — I499 Cardiac arrhythmia, unspecified: Secondary | ICD-10-CM | POA: Insufficient documentation

## 2014-10-20 DIAGNOSIS — W228XXA Striking against or struck by other objects, initial encounter: Secondary | ICD-10-CM | POA: Insufficient documentation

## 2014-10-20 DIAGNOSIS — S79911A Unspecified injury of right hip, initial encounter: Secondary | ICD-10-CM | POA: Diagnosis not present

## 2014-10-20 DIAGNOSIS — Y998 Other external cause status: Secondary | ICD-10-CM | POA: Diagnosis not present

## 2014-10-20 DIAGNOSIS — Z8669 Personal history of other diseases of the nervous system and sense organs: Secondary | ICD-10-CM | POA: Insufficient documentation

## 2014-10-20 DIAGNOSIS — Z79899 Other long term (current) drug therapy: Secondary | ICD-10-CM | POA: Diagnosis not present

## 2014-10-20 DIAGNOSIS — I5022 Chronic systolic (congestive) heart failure: Secondary | ICD-10-CM | POA: Diagnosis not present

## 2014-10-20 DIAGNOSIS — Y9289 Other specified places as the place of occurrence of the external cause: Secondary | ICD-10-CM | POA: Insufficient documentation

## 2014-10-20 DIAGNOSIS — M1990 Unspecified osteoarthritis, unspecified site: Secondary | ICD-10-CM | POA: Diagnosis not present

## 2014-10-20 DIAGNOSIS — G8929 Other chronic pain: Secondary | ICD-10-CM | POA: Insufficient documentation

## 2014-10-20 DIAGNOSIS — D649 Anemia, unspecified: Secondary | ICD-10-CM | POA: Insufficient documentation

## 2014-10-20 DIAGNOSIS — C9000 Multiple myeloma not having achieved remission: Secondary | ICD-10-CM | POA: Diagnosis not present

## 2014-10-20 DIAGNOSIS — M1611 Unilateral primary osteoarthritis, right hip: Secondary | ICD-10-CM | POA: Diagnosis not present

## 2014-10-20 DIAGNOSIS — E663 Overweight: Secondary | ICD-10-CM | POA: Insufficient documentation

## 2014-10-20 DIAGNOSIS — M47816 Spondylosis without myelopathy or radiculopathy, lumbar region: Secondary | ICD-10-CM | POA: Diagnosis not present

## 2014-10-20 DIAGNOSIS — M5136 Other intervertebral disc degeneration, lumbar region: Secondary | ICD-10-CM | POA: Diagnosis not present

## 2014-10-20 DIAGNOSIS — Z8701 Personal history of pneumonia (recurrent): Secondary | ICD-10-CM | POA: Diagnosis not present

## 2014-10-20 LAB — BASIC METABOLIC PANEL
Anion gap: 9 (ref 5–15)
BUN: 26 mg/dL — ABNORMAL HIGH (ref 6–20)
CALCIUM: 8.9 mg/dL (ref 8.9–10.3)
CO2: 28 mmol/L (ref 22–32)
CREATININE: 1.54 mg/dL — AB (ref 0.44–1.00)
Chloride: 102 mmol/L (ref 101–111)
GFR calc Af Amer: 39 mL/min — ABNORMAL LOW (ref >60)
GFR, EST NON AFRICAN AMERICAN: 34 mL/min — AB (ref >60)
GLUCOSE: 114 mg/dL — AB (ref 65–99)
Potassium: 3.4 mmol/L — ABNORMAL LOW (ref 3.5–5.1)
SODIUM: 139 mmol/L (ref 135–145)

## 2014-10-20 LAB — CBC WITH DIFFERENTIAL/PLATELET
Basophils Absolute: 0 10*3/uL (ref 0.0–0.1)
Basophils Relative: 0 % (ref 0–1)
Eosinophils Absolute: 0.1 10*3/uL (ref 0.0–0.7)
Eosinophils Relative: 2 % (ref 0–5)
HCT: 27.5 % — ABNORMAL LOW (ref 36.0–46.0)
HEMOGLOBIN: 8.7 g/dL — AB (ref 12.0–15.0)
LYMPHS ABS: 0.8 10*3/uL (ref 0.7–4.0)
Lymphocytes Relative: 30 % (ref 12–46)
MCH: 26.9 pg (ref 26.0–34.0)
MCHC: 31.6 g/dL (ref 30.0–36.0)
MCV: 85.1 fL (ref 78.0–100.0)
MONOS PCT: 11 % (ref 3–12)
Monocytes Absolute: 0.3 10*3/uL (ref 0.1–1.0)
Neutro Abs: 1.5 10*3/uL — ABNORMAL LOW (ref 1.7–7.7)
Neutrophils Relative %: 57 % (ref 43–77)
PLATELETS: 119 10*3/uL — AB (ref 150–400)
RBC: 3.23 MIL/uL — AB (ref 3.87–5.11)
RDW: 18.9 % — ABNORMAL HIGH (ref 11.5–15.5)
WBC: 2.6 10*3/uL — ABNORMAL LOW (ref 4.0–10.5)

## 2014-10-20 MED ORDER — MORPHINE SULFATE 4 MG/ML IJ SOLN
4.0000 mg | Freq: Once | INTRAMUSCULAR | Status: AC
Start: 2014-10-20 — End: 2014-10-20
  Administered 2014-10-20: 4 mg via INTRAMUSCULAR
  Filled 2014-10-20: qty 1

## 2014-10-20 MED ORDER — HYDROCODONE-ACETAMINOPHEN 5-325 MG PO TABS: 1.0000 | ORAL_TABLET | ORAL | Status: DC | PRN

## 2014-10-20 MED ORDER — OXYCODONE-ACETAMINOPHEN 5-325 MG PO TABS: 1.0000 | ORAL_TABLET | Freq: Four times a day (QID) | ORAL | Status: DC | PRN

## 2014-10-20 MED ORDER — MORPHINE SULFATE 4 MG/ML IJ SOLN
4.0000 mg | Freq: Once | INTRAMUSCULAR | Status: AC
  Administered 2014-10-20: 4 mg via INTRAVENOUS
  Filled 2014-10-20: qty 1

## 2014-10-20 NOTE — ED Notes (Signed)
Pt states that after moving for xrays her pain is back to a 9.

## 2014-10-20 NOTE — ED Notes (Signed)
Pt reports she injured her right knee two weeks ago, bruising noted at site. Pt now reports right knee pain that radiates up to her hip x 2 days. Pt states she took hydrocodone but it did not help with the pain.

## 2014-10-20 NOTE — ED Provider Notes (Signed)
CSN: [id number]     Arrival date & time 10/20/14  0913 History     Chief Complaint  Patient presents with  . Leg Pain   The history is provided by the patient. No language interpreter was used.   HPI Comments: [name] is a 69 y.o. female who presents to the Emergency Department complaining of right hip pain. Pain began about 2 days ago, without clear precipitant. Since onset there has been persistently in the right lateral superior hip radiating inferiorly to the lateral knee. Pain is worse with motion, palpation. Pain is not improved with Norco, tramadol. No loss of sensation distally, no abdominal pain, incontinence, fever, chills. Patient does recall incidental, about 2 weeks ago, hitting her shin against a hard object. She has a hematoma that is improving since that time. Patient has recent diagnosis of multiple myeloma, is taking chemotherapeutic agents for this. Patient also has history of recent travel, driving to [facility]. However, she denies changes in her typical degree of swelling of each lower extremity, and has no dyspnea, chest pain, fever, chills.    Past Medical History  Diagnosis Date  . Systolic heart failure     Chronic  . Overweight(278.02)   . Cardiomyopathy secondary   . Hypertension     takes Diovan and Spirololactone daily  . Dysrhythmia     pt takes Digoxin and Carvedilol daily  . Sleep apnea     doesn't use CPAP  . Pneumonia     hx of;many yrs ago  . Peripheral neuropathy   . Arthritis     ankles  . Gout     hx of x 1 time in Aug 2012  . GERD (gastroesophageal reflux disease)     takes Protonix seldom  . H/O hiatal hernia   . Hemorrhoid   . Anemia     takes Ferrous Fumarate 2 times week  . Blood transfusion     hx of-34yrs ago  . Thyroid nodule     right  . Diabetes mellitus     takes Glipizide and Metformin daily  . Cataract     bilateral and immature  . Left leg pain   . Chronic back pain   . Multiple myeloma     IgA  myeloma; chemo at [facility] Onc MD at [facility]  . CHF (congestive heart failure)     takes Lasix prn, Echo 2015 with EF 40%   Past Surgical History  Procedure Laterality Date  . Cardiac defirillator removed      removed <44months;removed d/t shocking pt 15times;pierced sac of heart 1000cc ;drained  . Stomach stapling surgery  80's  . Appendectomy  1991  . Tummy tuck  1991  . Leg lipoma    . Abdominal hysterectomy  1991  . Tubal ligation  1971  . Ovarian cyst removed  1991  . Cholecystectomy open  1992  . Colonoscopy    . Thyroid lobectomy  07/27/2011    Procedure: THYROID LOBECTOMY;  Surgeon: Gayland Curry, MD,FACS;  Location: [place];  Service: General;  Laterality: Right;  . Cardiac defibrillator placement  >60yrs ago  . Defibrillator removed    . Colonoscopy N/A 02/08/2014    Procedure: COLONOSCOPY;  Surgeon: Danie Binder, MD;  [facility];  Service: Endoscopy;  Laterality: N/A;  8:30AM  . Esophagogastroduodenoscopy N/A 02/08/2014    Procedure: ESOPHAGOGASTRODUODENOSCOPY (EGD);  Surgeon: Danie Binder, MD;  [facility];  Service: Endoscopy;  Laterality:  N/A;   Family History  Problem Relation Age of Onset  . Anesthesia problems Neg Hx   . Hypotension Neg Hx   . Malignant hyperthermia Neg Hx   . Pseudochol deficiency Neg Hx   . Heart failure Other    History  Substance Use Topics  . Smoking status: Never Smoker   . Smokeless tobacco: Never Used  . Alcohol Use: 0.0 oz/week    0 Standard drinks or equivalent per week     Comment: glass wine occasionally   OB History    Gravida Para Term Preterm AB TAB SAB Ectopic Multiple Living   '2 2 2            '$ Review of Systems  Constitutional:       Per HPI, otherwise negative  HENT:       Per HPI, otherwise negative  Respiratory:       Per HPI, otherwise negative  Cardiovascular:       Per HPI, otherwise negative  Gastrointestinal: Negative for vomiting.  Endocrine:       Negative aside from HPI   Genitourinary:       Neg aside from HPI   Musculoskeletal:       Per HPI, otherwise negative  Skin: Positive for color change and wound.  Neurological: Negative for syncope.      Allergies  Doxycycline  Home Medications   Prior to Admission medications   Medication Sig Start Date End Date Taking? Authorizing Provider  aspirin 325 MG tablet Take 325 mg by mouth daily.   Yes Historical Provider, MD  carvedilol (COREG) 25 MG tablet Take 1 tablet (25 mg total) by mouth 2 (two) times daily. 08/06/13 04/17/15 Yes Arnoldo Lenis, MD  CINNAMON PO Take 2 capsules by mouth every morning.    Yes Historical Provider, MD  dexamethasone (DECADRON) 4 MG tablet Take 20 mg by mouth once a week. On Wednesday 08/28/14  Yes Historical Provider, MD  Flaxseed, Linseed, (FLAX SEED OIL PO) Take 1 capsule by mouth every morning.    Yes Historical Provider, MD  furosemide (LASIX) 40 MG tablet Take 1 tablet (40 mg total) by mouth daily. For fluid retention Patient taking differently: Take 40 mg by mouth at bedtime. For fluid retention 10/27/13  Yes Barton Dubois, MD  glipiZIDE (GLUCOTROL) 5 MG tablet Take 5 mg by mouth every evening.  11/23/12  Yes Historical Provider, MD  HUMALOG KWIKPEN 100 UNIT/ML KiwkPen Take 5-12 Units by mouth 3 (three) times daily. 09/17/14  Yes Historical Provider, MD  HYDROcodone-acetaminophen (NORCO) 5-325 MG per tablet Take 1-2 tablets by mouth every 4 (four) hours as needed. Patient taking differently: Take 1-2 tablets by mouth every 4 (four) hours as needed (pain).  12/16/13  Yes Nat Christen, MD  levothyroxine (SYNTHROID, LEVOTHROID) 100 MCG tablet Take 100 mcg by mouth daily before breakfast.   Yes Historical Provider, MD  spironolactone (ALDACTONE) 25 MG tablet Take 12.5 mg by mouth daily as needed (fluid).    Yes Historical Provider, MD  tetrahydrozoline (EYE DROPS) 0.05 % ophthalmic solution Place 1 drop into both eyes daily as needed. For dry eye relief   Yes Historical Provider, MD   TRADJENTA 5 MG TABS tablet Take 5 mg by mouth daily.  11/21/13  Yes Historical Provider, MD   BP 124/70 mmHg  Pulse 65  Temp(Src) 98 F (36.7 C) (Oral)  Resp 16  Wt 310 lb (140.615 kg)  SpO2 100% Physical Exam  Constitutional: She is oriented  to person, place, and time. She appears well-developed and well-nourished. No distress.  HENT:  Head: Normocephalic and atraumatic.  Eyes: Conjunctivae and EOM are normal.  Cardiovascular: Normal rate and regular rhythm.   Pulmonary/Chest: Effort normal and breath sounds normal. No stridor. No respiratory distress.  Abdominal: She exhibits no distension.  Musculoskeletal: She exhibits no edema.       Right hip: She exhibits tenderness and bony tenderness. She exhibits normal range of motion, normal strength, no swelling, no crepitus, no deformity and no laceration.       Left knee: Normal.       Right ankle: Normal.       Legs: Neurological: She is alert and oriented to person, place, and time. No cranial nerve deficit.  Skin: Skin is warm and dry.  Psychiatric: She has a normal mood and affect.  Nursing note and vitals reviewed.   ED Course  Procedures (including critical care time)  COORDINATION OF CARE:    Labs Review Labs Reviewed  CBC WITH DIFFERENTIAL/PLATELET - Abnormal; Notable for the following:    WBC 2.6 (*)    RBC 3.23 (*)    Hemoglobin 8.7 (*)    HCT 27.5 (*)    RDW 18.9 (*)    Platelets 119 (*)    Neutro Abs 1.5 (*)    All other components within normal limits  BASIC METABOLIC PANEL - Abnormal; Notable for the following:    Potassium 3.4 (*)    Glucose, Bld 114 (*)    BUN 26 (*)    Creatinine, Ser 1.54 (*)    GFR calc non Af Amer 34 (*)    GFR calc Af Amer 39 (*)    All other components within normal limits    Imaging Review Dg Lumbar Spine Complete  10/20/2014   CLINICAL DATA:  Right hip and low back pain.  No known injury.  EXAM: LUMBAR SPINE - COMPLETE 4+ VIEW  COMPARISON:  None.  FINDINGS: Degenerative  facet disease throughout the lumbar spine. Mild degenerative spurring anteriorly in the lumbar spine and lower thoracic spine. Normal alignment. No fracture. No acute scratch head SI joints are symmetric and unremarkable.  IMPRESSION: Mild degenerative facet disease, early degenerative disc disease. No acute findings.   Electronically Signed   By: Rolm Baptise M.D.   On: 10/20/2014 10:40   Dg Hip Unilat With Pelvis 2-3 Views Right  10/20/2014   CLINICAL DATA:  69 year old female with right hip pain.  EXAM: DG HIP (WITH OR WITHOUT PELVIS) 2-3V RIGHT  COMPARISON:  None.  FINDINGS: There is no evidence of fracture, subluxation or dislocation.  Mild degenerative changes within both hips noted.  Degenerative changes in the lower lumbar spine are present.  No focal bony lesions are identified.  IMPRESSION: No evidence of acute abnormality.  Mild degenerative changes within the hips and degenerative changes within the lumbar spine.   Electronically Signed   By: Margarette Canada M.D.   On: 10/20/2014 10:42   On repeat exam the patient is feeling better. Discharged in stable condition to follow-up with orthopedics. MDM   Patient presents with ongoing right lateral hip and leg pain. Patient is distally neurovascular intact, awake, alert, hemodynamically stable, no chest pain, dyspnea. Patient's physical exam is not consistent with pulmonary embolism, as her pain is reproducible, focally about the iliotibial band, and right lateral proximal lower extremity. Patient feels analgesia, was discharged in stable condition to follow-up with primary care and orthopedics.  I personally performed  the services described in this documentation, which was scribed in my presence. The recorded information has been reviewed and is accurate.     Carmin Muskrat, MD 10/20/14 [phone]

## 2014-10-20 NOTE — Discharge Instructions (Signed)
As discussed, today's evaluation is reassuring.  Your pain is likely due to soft tissue injury, possibly strain of the iliotibial band, or connection between your hip and knee.  Please take all medication as directed, and be sure to follow-up with your physician and your orthopedist.  Return here for concerning changes in your condition.

## 2014-10-23 DIAGNOSIS — C9000 Multiple myeloma not having achieved remission: Secondary | ICD-10-CM | POA: Diagnosis not present

## 2014-10-23 DIAGNOSIS — T148XXA Other injury of unspecified body region, initial encounter: Secondary | ICD-10-CM | POA: Insufficient documentation

## 2014-10-24 DIAGNOSIS — E890 Postprocedural hypothyroidism: Secondary | ICD-10-CM | POA: Diagnosis not present

## 2014-10-24 DIAGNOSIS — R600 Localized edema: Secondary | ICD-10-CM | POA: Diagnosis not present

## 2014-10-24 DIAGNOSIS — E11329 Type 2 diabetes mellitus with mild nonproliferative diabetic retinopathy without macular edema: Secondary | ICD-10-CM | POA: Diagnosis not present

## 2014-10-30 DIAGNOSIS — C9000 Multiple myeloma not having achieved remission: Secondary | ICD-10-CM | POA: Diagnosis not present

## 2014-10-30 DIAGNOSIS — D649 Anemia, unspecified: Secondary | ICD-10-CM | POA: Diagnosis not present

## 2014-10-30 DIAGNOSIS — D61818 Other pancytopenia: Secondary | ICD-10-CM | POA: Diagnosis not present

## 2014-10-31 DIAGNOSIS — D649 Anemia, unspecified: Secondary | ICD-10-CM | POA: Diagnosis not present

## 2014-10-31 DIAGNOSIS — D61818 Other pancytopenia: Secondary | ICD-10-CM | POA: Diagnosis not present

## 2014-10-31 DIAGNOSIS — C9000 Multiple myeloma not having achieved remission: Secondary | ICD-10-CM | POA: Diagnosis not present

## 2014-11-06 ENCOUNTER — Other Ambulatory Visit: Payer: Self-pay | Admitting: Oncology

## 2014-11-06 DIAGNOSIS — C9000 Multiple myeloma not having achieved remission: Secondary | ICD-10-CM | POA: Diagnosis not present

## 2014-11-06 DIAGNOSIS — Z9289 Personal history of other medical treatment: Secondary | ICD-10-CM | POA: Diagnosis not present

## 2014-11-12 ENCOUNTER — Other Ambulatory Visit: Payer: Self-pay | Admitting: Oncology

## 2014-11-12 DIAGNOSIS — C9000 Multiple myeloma not having achieved remission: Secondary | ICD-10-CM

## 2014-11-12 DIAGNOSIS — R52 Pain, unspecified: Secondary | ICD-10-CM

## 2014-11-13 DIAGNOSIS — C9000 Multiple myeloma not having achieved remission: Secondary | ICD-10-CM | POA: Diagnosis not present

## 2014-11-15 ENCOUNTER — Ambulatory Visit (INDEPENDENT_AMBULATORY_CARE_PROVIDER_SITE_OTHER): Payer: Medicare Other | Admitting: Cardiology

## 2014-11-15 VITALS — BP 136/69 | HR 73 | Ht 64.0 in

## 2014-11-15 DIAGNOSIS — I10 Essential (primary) hypertension: Secondary | ICD-10-CM

## 2014-11-15 DIAGNOSIS — I5022 Chronic systolic (congestive) heart failure: Secondary | ICD-10-CM

## 2014-11-15 NOTE — Progress Notes (Signed)
Patient ID: [name], female   DOB: Nov 05, 1945, 69 y.o.   MRN: [id number]     Clinical Summary [name] is a 69 y.o.female seen today for follow up of the following medical problems.   1. NICM  - echo 07/2013 LVEF 40-45%, mod to severe LVH.  - previously had ICD, had troubles with lead dislodgment and inappropriate shocks in the past and device was removed. Her LVEF has improved and she has not required consideration for another device.    - notes some LE edema at times. No SOB or DOE - home weights 304 at home and stable  2. HTN   - does not check regularly at home - compliant with meds  3. Dizziness/Palpitations  - symptoms have resolved since cutting back on meds  4. Multiple myeloma - followed by Dr [name] in [place] and Dr [name] at [facility]   5. CKD - followed by renal Dr [name], appt in October - labs 11/13/14 at [facility]: Cr 1.61 and overall stable  6. DM2 - followed by endocrine.  Past Medical History  Diagnosis Date  . Systolic heart failure     Chronic  . Overweight(278.02)   . Cardiomyopathy secondary   . Hypertension     takes Diovan and Spirololactone daily  . Dysrhythmia     pt takes Digoxin and Carvedilol daily  . Sleep apnea     doesn't use CPAP  . Pneumonia     hx of;many yrs ago  . Peripheral neuropathy   . Arthritis     ankles  . Gout     hx of x 1 time in Aug 2012  . GERD (gastroesophageal reflux disease)     takes Protonix seldom  . H/O hiatal hernia   . Hemorrhoid   . Anemia     takes Ferrous Fumarate 2 times week  . Blood transfusion     hx of-88yr ago  . Thyroid nodule     right  . Diabetes mellitus     takes Glipizide and Metformin daily  . Cataract     bilateral and immature  . Left leg pain   . Chronic back pain   . Multiple myeloma     IgA myeloma; [name] . CHF (congestive heart failure)     takes Lasix prn, Echo 2015 with EF 40%     Allergies  Allergen  Reactions  . Doxycycline Rash     Current Outpatient Prescriptions  Medication Sig Dispense Refill  . aspirin 325 MG tablet Take 325 mg by mouth daily.    . carvedilol (COREG) 25 MG tablet Take 1 tablet (25 mg total) by mouth 2 (two) times daily. 60 tablet 11  . CINNAMON PO Take 2 capsules by mouth every morning.     .Marland Kitchendexamethasone (DECADRON) 4 MG tablet Take 20 mg by mouth once a week. On Wednesday    . Flaxseed, Linseed, (FLAX SEED OIL PO) Take 1 capsule by mouth every morning.     . furosemide (LASIX) 40 MG tablet Take 1 tablet (40 mg total) by mouth daily. For fluid retention (Patient taking differently: Take 40 mg by mouth at bedtime. For fluid retention) 30 tablet 1  . glipiZIDE (GLUCOTROL) 5 MG tablet Take 5 mg by mouth every evening.     .Marland KitchenHUMALOG KWIKPEN 100 UNIT/ML KiwkPen Take 5-12 Units by mouth 3 (three) times daily.    .Marland Kitchenlevothyroxine (SYNTHROID, LEVOTHROID) 100 MCG  tablet Take 100 mcg by mouth daily before breakfast.    . oxyCODONE-acetaminophen (PERCOCET/ROXICET) 5-325 MG per tablet Take 1 tablet by mouth every 6 (six) hours as needed for severe pain. 15 tablet 0  . spironolactone (ALDACTONE) 25 MG tablet Take 12.5 mg by mouth daily as needed (fluid).     [name] tetrahydrozoline (EYE DROPS) 0.05 % ophthalmic solution Place 1 drop into both eyes daily as needed. For dry eye relief    . TRADJENTA 5 MG TABS tablet Take 5 mg by mouth daily.      No current facility-administered medications for this visit.     Past Surgical History  Procedure Laterality Date  . Cardiac defirillator removed      removed <58month;removed d/t shocking pt 15times;pierced sac of heart 1000cc ;drained  . Stomach stapling surgery  80's  . Appendectomy  1991  . Tummy tuck  1991  . Leg lipoma    . Abdominal hysterectomy  1991  . Tubal ligation  1971  . Ovarian cyst removed  1991  . Cholecystectomy open  1992  . Colonoscopy    . Thyroid lobectomy  07/27/2011    Procedure: THYROID LOBECTOMY;   Surgeon: EGayland Curry MD,FACS;  [place]  Service: General;  Laterality: Right;  . Cardiac defibrillator placement  >368yrago  . Defibrillator removed    . Colonoscopy N/A 02/08/2014    Procedure: COLONOSCOPY;  Surgeon: SaDanie BinderMD;  [facility];  Service: Endoscopy;  Laterality: N/A;  8:30AM  . Esophagogastroduodenoscopy N/A 02/08/2014    Procedure: ESOPHAGOGASTRODUODENOSCOPY (EGD);  Surgeon: SaDanie BinderMD;  [facility];  Service: Endoscopy;  Laterality: N/A;     Allergies  Allergen Reactions  . Doxycycline Rash      Family History  Problem Relation Age of Onset  . Anesthesia problems Neg Hx   . Hypotension Neg Hx   . Malignant hyperthermia Neg Hx   . Pseudochol deficiency Neg Hx   . Heart failure Other      Social History [name] that she has never smoked. She has never used smokeless tobacco. [name] that she drinks alcohol.   Review of Systems CONSTITUTIONAL: No weight loss, fever, chills, weakness or fatigue.  HEENT: Eyes: No visual loss, blurred vision, double vision or yellow sclerae.No hearing loss, sneezing, congestion, runny nose or sore throat.  SKIN: No rash or itching.  CARDIOVASCULAR: per HPI RESPIRATORY: No shortness of breath, cough or sputum.  GASTROINTESTINAL: No anorexia, nausea, vomiting or diarrhea. No abdominal pain or blood.  GENITOURINARY: No burning on urination, no polyuria NEUROLOGICAL: No headache, dizziness, syncope, paralysis, ataxia, numbness or tingling in the extremities. No change in bowel or bladder control.  MUSCULOSKELETAL: No muscle, back pain, joint pain or stiffness.  LYMPHATICS: No enlarged nodes. No history of splenectomy.  PSYCHIATRIC: No history of depression or anxiety.  ENDOCRINOLOGIC: No reports of sweating, cold or heat intolerance. No polyuria or polydipsia.  . [name] Physical Examination Filed Vitals:   11/15/14 0927  BP: 136/69  Pulse: 73   Filed  Vitals:   11/15/14 0927  Height: _0  (1.626 m)    Gen: resting comfortably, no acute distress HEENT: no scleral icterus, pupils equal round and reactive, no palptable cervical adenopathy,  CV: RRR, no m/r/g, no jvd Resp: Clear to auscultation bilaterally GI: abdomen is soft, non-tender, non-distended, normal bowel sounds, no hepatosplenomegaly MSK: extremities are warm, no edema.  Skin: warm, no rash  Neuro:  no focal deficits Psych: appropriate affect   Diagnostic Studies 08/07/13 Echo  Study Conclusions  - Procedure narrative: Transthoracic echocardiography. Image quality was suboptimal. The study was technically difficult, as a result of poor sound wave transmission and body habitus. - Left ventricle: The cavity size was normal. Wall thickness was increased in a pattern of moderate to severe LVH. Systolic function was mildly to moderately reduced. The estimated ejection fraction was in the range of 40% to 45%. Mild to moderate global hypokinesis was seen. There was an increased relative contribution of atrial contraction to ventricular filling. Doppler parameters are consistent with abnormal left ventricular relaxation (grade 1 diastolic dysfunction). - Aortic valve: Mildly calcified annulus. Trileaflet. - Left atrium: The atrium was mildly dilated. - Right atrium: The atrium was mildly dilated.   07/2013 Event Monitor NSR, frequent PVCs    Assessment and Plan   1. NICM/Chronic systolic heart failure - echo 07/2013 LVEF 40-45%, NYHA II.  - appears euvolemic today, continue current meds   2. HTN - continue current meds, at goal    F/u 3 months   Arnoldo [name], M.D.

## 2014-11-15 NOTE — Patient Instructions (Signed)
Your physician recommends that you schedule a follow-up appointment in: 3 months with Dr Branch     Your physician recommends that you continue on your current medications as directed. Please refer to the Current Medication list given to you today.    Thank you for choosing [facility] !         

## 2014-11-17 ENCOUNTER — Encounter: Payer: Self-pay | Admitting: Cardiology

## 2014-11-19 DIAGNOSIS — E1122 Type 2 diabetes mellitus with diabetic chronic kidney disease: Secondary | ICD-10-CM | POA: Diagnosis not present

## 2014-11-19 DIAGNOSIS — I10 Essential (primary) hypertension: Secondary | ICD-10-CM | POA: Diagnosis not present

## 2014-11-19 DIAGNOSIS — S8011XA Contusion of right lower leg, initial encounter: Secondary | ICD-10-CM | POA: Diagnosis not present

## 2014-11-19 DIAGNOSIS — C9000 Multiple myeloma not having achieved remission: Secondary | ICD-10-CM | POA: Diagnosis not present

## 2014-11-20 DIAGNOSIS — C9000 Multiple myeloma not having achieved remission: Secondary | ICD-10-CM | POA: Diagnosis not present

## 2014-11-21 ENCOUNTER — Ambulatory Visit
Admission: RE | Admit: 2014-11-21 | Discharge: 2014-11-21 | Disposition: A | Discharge status: Home / Self Care | Payer: Medicare Other | Source: Ambulatory Visit | Attending: Oncology | Admitting: Oncology

## 2014-11-21 DIAGNOSIS — R52 Pain, unspecified: Secondary | ICD-10-CM

## 2014-11-21 DIAGNOSIS — M4806 Spinal stenosis, lumbar region: Secondary | ICD-10-CM | POA: Diagnosis not present

## 2014-11-21 DIAGNOSIS — C9000 Multiple myeloma not having achieved remission: Secondary | ICD-10-CM

## 2014-11-26 DIAGNOSIS — D6481 Anemia due to antineoplastic chemotherapy: Secondary | ICD-10-CM | POA: Insufficient documentation

## 2014-11-26 DIAGNOSIS — T451X5A Adverse effect of antineoplastic and immunosuppressive drugs, initial encounter: Secondary | ICD-10-CM

## 2014-11-27 DIAGNOSIS — C9000 Multiple myeloma not having achieved remission: Secondary | ICD-10-CM | POA: Diagnosis not present

## 2014-11-27 DIAGNOSIS — D6481 Anemia due to antineoplastic chemotherapy: Secondary | ICD-10-CM | POA: Diagnosis not present

## 2014-12-03 DIAGNOSIS — E119 Type 2 diabetes mellitus without complications: Secondary | ICD-10-CM | POA: Diagnosis not present

## 2014-12-04 DIAGNOSIS — D6481 Anemia due to antineoplastic chemotherapy: Secondary | ICD-10-CM | POA: Diagnosis not present

## 2014-12-04 DIAGNOSIS — N289 Disorder of kidney and ureter, unspecified: Secondary | ICD-10-CM | POA: Diagnosis not present

## 2014-12-04 DIAGNOSIS — I509 Heart failure, unspecified: Secondary | ICD-10-CM | POA: Diagnosis not present

## 2014-12-04 DIAGNOSIS — C9000 Multiple myeloma not having achieved remission: Secondary | ICD-10-CM | POA: Diagnosis not present

## 2014-12-04 DIAGNOSIS — D61818 Other pancytopenia: Secondary | ICD-10-CM | POA: Diagnosis not present

## 2014-12-11 DIAGNOSIS — C9000 Multiple myeloma not having achieved remission: Secondary | ICD-10-CM | POA: Diagnosis not present

## 2014-12-18 DIAGNOSIS — D6481 Anemia due to antineoplastic chemotherapy: Secondary | ICD-10-CM | POA: Diagnosis not present

## 2014-12-18 DIAGNOSIS — D61818 Other pancytopenia: Secondary | ICD-10-CM | POA: Diagnosis not present

## 2014-12-18 DIAGNOSIS — Z5111 Encounter for antineoplastic chemotherapy: Secondary | ICD-10-CM | POA: Diagnosis not present

## 2014-12-18 DIAGNOSIS — C9000 Multiple myeloma not having achieved remission: Secondary | ICD-10-CM | POA: Diagnosis not present

## 2014-12-26 DIAGNOSIS — C9000 Multiple myeloma not having achieved remission: Secondary | ICD-10-CM | POA: Diagnosis not present

## 2014-12-31 DIAGNOSIS — E876 Hypokalemia: Secondary | ICD-10-CM | POA: Diagnosis not present

## 2014-12-31 DIAGNOSIS — N183 Chronic kidney disease, stage 3 (moderate): Secondary | ICD-10-CM | POA: Diagnosis not present

## 2014-12-31 DIAGNOSIS — D649 Anemia, unspecified: Secondary | ICD-10-CM | POA: Diagnosis not present

## 2014-12-31 DIAGNOSIS — C9000 Multiple myeloma not having achieved remission: Secondary | ICD-10-CM | POA: Diagnosis not present

## 2015-01-01 DIAGNOSIS — C9000 Multiple myeloma not having achieved remission: Secondary | ICD-10-CM | POA: Diagnosis not present

## 2015-01-01 DIAGNOSIS — D696 Thrombocytopenia, unspecified: Secondary | ICD-10-CM | POA: Diagnosis not present

## 2015-01-01 DIAGNOSIS — D6481 Anemia due to antineoplastic chemotherapy: Secondary | ICD-10-CM | POA: Diagnosis not present

## 2015-01-09 DIAGNOSIS — C9000 Multiple myeloma not having achieved remission: Secondary | ICD-10-CM | POA: Diagnosis not present

## 2015-01-09 DIAGNOSIS — Z5111 Encounter for antineoplastic chemotherapy: Secondary | ICD-10-CM | POA: Diagnosis not present

## 2015-01-15 DIAGNOSIS — C9000 Multiple myeloma not having achieved remission: Secondary | ICD-10-CM | POA: Diagnosis not present

## 2015-01-15 DIAGNOSIS — Z23 Encounter for immunization: Secondary | ICD-10-CM | POA: Diagnosis not present

## 2015-01-15 DIAGNOSIS — D6481 Anemia due to antineoplastic chemotherapy: Secondary | ICD-10-CM | POA: Diagnosis not present

## 2015-01-15 DIAGNOSIS — G622 Polyneuropathy due to other toxic agents: Secondary | ICD-10-CM | POA: Insufficient documentation

## 2015-01-15 DIAGNOSIS — Z5111 Encounter for antineoplastic chemotherapy: Secondary | ICD-10-CM | POA: Diagnosis not present

## 2015-01-15 DIAGNOSIS — D61818 Other pancytopenia: Secondary | ICD-10-CM | POA: Diagnosis not present

## 2015-01-22 DIAGNOSIS — C9000 Multiple myeloma not having achieved remission: Secondary | ICD-10-CM | POA: Diagnosis not present

## 2015-01-23 DIAGNOSIS — C9000 Multiple myeloma not having achieved remission: Secondary | ICD-10-CM | POA: Diagnosis not present

## 2015-01-29 ENCOUNTER — Telehealth: Payer: Self-pay | Admitting: *Deleted

## 2015-01-29 DIAGNOSIS — N289 Disorder of kidney and ureter, unspecified: Secondary | ICD-10-CM | POA: Diagnosis not present

## 2015-01-29 DIAGNOSIS — I509 Heart failure, unspecified: Secondary | ICD-10-CM | POA: Diagnosis not present

## 2015-01-29 DIAGNOSIS — D61818 Other pancytopenia: Secondary | ICD-10-CM | POA: Diagnosis not present

## 2015-01-29 DIAGNOSIS — C9000 Multiple myeloma not having achieved remission: Secondary | ICD-10-CM | POA: Diagnosis not present

## 2015-01-29 NOTE — Telephone Encounter (Signed)
Care everywhere inquiry

## 2015-01-30 ENCOUNTER — Other Ambulatory Visit (HOSPITAL_COMMUNITY): Payer: Self-pay | Admitting: Oncology

## 2015-01-30 DIAGNOSIS — C9002 Multiple myeloma in relapse: Secondary | ICD-10-CM

## 2015-01-31 DIAGNOSIS — R600 Localized edema: Secondary | ICD-10-CM | POA: Diagnosis not present

## 2015-01-31 DIAGNOSIS — E113299 Type 2 diabetes mellitus with mild nonproliferative diabetic retinopathy without macular edema, unspecified eye: Secondary | ICD-10-CM | POA: Diagnosis not present

## 2015-01-31 DIAGNOSIS — E119 Type 2 diabetes mellitus without complications: Secondary | ICD-10-CM | POA: Diagnosis not present

## 2015-01-31 DIAGNOSIS — E890 Postprocedural hypothyroidism: Secondary | ICD-10-CM | POA: Diagnosis not present

## 2015-02-04 DIAGNOSIS — D6481 Anemia due to antineoplastic chemotherapy: Secondary | ICD-10-CM | POA: Diagnosis not present

## 2015-02-04 DIAGNOSIS — D61818 Other pancytopenia: Secondary | ICD-10-CM | POA: Diagnosis not present

## 2015-02-04 DIAGNOSIS — D696 Thrombocytopenia, unspecified: Secondary | ICD-10-CM | POA: Diagnosis not present

## 2015-02-04 DIAGNOSIS — C9000 Multiple myeloma not having achieved remission: Secondary | ICD-10-CM | POA: Diagnosis not present

## 2015-02-04 DIAGNOSIS — T451X5A Adverse effect of antineoplastic and immunosuppressive drugs, initial encounter: Secondary | ICD-10-CM | POA: Diagnosis not present

## 2015-02-17 ENCOUNTER — Telehealth: Payer: Self-pay | Admitting: Gastroenterology

## 2015-02-17 ENCOUNTER — Ambulatory Visit: Payer: Medicare Other | Admitting: Cardiology

## 2015-02-17 NOTE — Telephone Encounter (Signed)
Pt seen on her Mychart that she needed to have labs done and she said it has been awhile since she's been on mychart and didn't know how old it was and wanted to know was she due for labs. I told her that DS would be back tomorrow and let me check with her and see because there wasn't anything on the recall list of her having to have labs. Please advise and call [phone]

## 2015-02-18 ENCOUNTER — Other Ambulatory Visit: Payer: Self-pay | Admitting: Radiology

## 2015-02-18 NOTE — Telephone Encounter (Signed)
I co not see that pt needs labs. However, she was mailed a letter in 04/2014 to schedule follow up appt and had not responded. I called pt and she said that she misplaced her letter.  Transferred call to [facility] to schedule appt.

## 2015-02-19 ENCOUNTER — Ambulatory Visit (HOSPITAL_COMMUNITY)
Admission: RE | Admit: 2015-02-19 | Discharge: 2015-02-19 | Disposition: A | Discharge status: Home / Self Care | Payer: Medicare Other | Source: Ambulatory Visit | Attending: Oncology | Admitting: Oncology

## 2015-02-19 ENCOUNTER — Encounter (HOSPITAL_COMMUNITY): Payer: Self-pay

## 2015-02-19 DIAGNOSIS — I5022 Chronic systolic (congestive) heart failure: Secondary | ICD-10-CM | POA: Insufficient documentation

## 2015-02-19 DIAGNOSIS — E663 Overweight: Secondary | ICD-10-CM | POA: Insufficient documentation

## 2015-02-19 DIAGNOSIS — Z01812 Encounter for preprocedural laboratory examination: Secondary | ICD-10-CM | POA: Insufficient documentation

## 2015-02-19 DIAGNOSIS — Z8249 Family history of ischemic heart disease and other diseases of the circulatory system: Secondary | ICD-10-CM | POA: Insufficient documentation

## 2015-02-19 DIAGNOSIS — C9002 Multiple myeloma in relapse: Secondary | ICD-10-CM

## 2015-02-19 DIAGNOSIS — I10 Essential (primary) hypertension: Secondary | ICD-10-CM | POA: Insufficient documentation

## 2015-02-19 DIAGNOSIS — G4730 Sleep apnea, unspecified: Secondary | ICD-10-CM | POA: Diagnosis not present

## 2015-02-19 DIAGNOSIS — D696 Thrombocytopenia, unspecified: Secondary | ICD-10-CM | POA: Insufficient documentation

## 2015-02-19 DIAGNOSIS — E119 Type 2 diabetes mellitus without complications: Secondary | ICD-10-CM | POA: Insufficient documentation

## 2015-02-19 DIAGNOSIS — D649 Anemia, unspecified: Secondary | ICD-10-CM | POA: Insufficient documentation

## 2015-02-19 DIAGNOSIS — K219 Gastro-esophageal reflux disease without esophagitis: Secondary | ICD-10-CM | POA: Diagnosis not present

## 2015-02-19 DIAGNOSIS — M549 Dorsalgia, unspecified: Secondary | ICD-10-CM | POA: Diagnosis not present

## 2015-02-19 DIAGNOSIS — I429 Cardiomyopathy, unspecified: Secondary | ICD-10-CM | POA: Diagnosis not present

## 2015-02-19 DIAGNOSIS — M899 Disorder of bone, unspecified: Secondary | ICD-10-CM | POA: Diagnosis not present

## 2015-02-19 LAB — CBC
HCT: 31.7 % — ABNORMAL LOW (ref 36.0–46.0)
Hemoglobin: 9.9 g/dL — ABNORMAL LOW (ref 12.0–15.0)
MCH: 28 pg (ref 26.0–34.0)
MCHC: 31.2 g/dL (ref 30.0–36.0)
MCV: 89.8 fL (ref 78.0–100.0)
Platelets: 135 10*3/uL — ABNORMAL LOW (ref 150–400)
RBC: 3.53 MIL/uL — AB (ref 3.87–5.11)
RDW: 15.8 % — ABNORMAL HIGH (ref 11.5–15.5)
WBC: 4.4 10*3/uL (ref 4.0–10.5)

## 2015-02-19 LAB — BONE MARROW EXAM

## 2015-02-19 LAB — PROTIME-INR
INR: 1.31 (ref 0.00–1.49)
PROTHROMBIN TIME: 16.4 s — AB (ref 11.6–15.2)

## 2015-02-19 LAB — GLUCOSE, CAPILLARY: GLUCOSE-CAPILLARY: 143 mg/dL — AB (ref 65–99)

## 2015-02-19 LAB — APTT: aPTT: 26 seconds (ref 24–37)

## 2015-02-19 MED ORDER — FENTANYL CITRATE (PF) 100 MCG/2ML IJ SOLN
INTRAMUSCULAR | Status: AC
  Filled 2015-02-19: qty 4

## 2015-02-19 MED ORDER — FENTANYL CITRATE (PF) 100 MCG/2ML IJ SOLN
INTRAMUSCULAR | Status: AC | PRN
  Administered 2015-02-19 (×2): 50 ug via INTRAVENOUS

## 2015-02-19 MED ORDER — MIDAZOLAM HCL 2 MG/2ML IJ SOLN
INTRAMUSCULAR | Status: AC | PRN
  Administered 2015-02-19 (×4): 1 mg via INTRAVENOUS

## 2015-02-19 MED ORDER — SODIUM CHLORIDE 0.9 % IV SOLN
INTRAVENOUS | Status: DC
  Administered 2015-02-19: 07:00:00 via INTRAVENOUS

## 2015-02-19 MED ORDER — MIDAZOLAM HCL 2 MG/2ML IJ SOLN
INTRAMUSCULAR | Status: AC
  Filled 2015-02-19: qty 6

## 2015-02-19 NOTE — Procedures (Signed)
Interventional Radiology Procedure Note  Procedure: CT guided aspirate and core biopsy of left iliac bone Complications: None Recommendations: - Bedrest for 1 hour - Follow biopsy results  Bayleigh Loflin T. Kathlene Cote, M.D Pager:  [phone]

## 2015-02-19 NOTE — Discharge Instructions (Signed)
Bone Marrow Aspiration and Bone Marrow Biopsy, Care After Refer to this sheet in the next few weeks. These instructions provide you with information about caring for yourself after your procedure. Your health care provider may also give you more specific instructions. Your treatment has been planned according to current medical practices, but problems sometimes occur. Call your health care provider if you have any problems or questions after your procedure. WHAT TO EXPECT AFTER THE PROCEDURE After your procedure, it is common to have:  Soreness or tenderness around the puncture site.  Bruising. HOME CARE INSTRUCTIONS  Take medicines only as directed by your health care provider.  Follow your health care provider's instructions about:  Puncture site care.  Bandage (dressing) changes and removal.  Bathe and shower as directed by your health care provider.  Check your puncture site every day for signs of infection. Watch for:  Redness, swelling, or pain.  Fluid, blood, or pus.  Return to your normal activities as directed by your health care provider.  Keep all follow-up visits as directed by your health care provider. This is important. SEEK MEDICAL CARE IF:  You have a fever.  You have uncontrollable bleeding.  You have redness, swelling, or pain at the site of your puncture.  You have fluid, blood, or pus coming from your puncture site.   This information is not intended to replace advice given to you by your health care provider. Make sure you discuss any questions you have with your health care provider.   Document Released: 09/25/2004 Document Revised: 07/23/2014 Document Reviewed: 02/27/2014 Elsevier Interactive Patient Education 2016 [employer]. Moderate Conscious Sedation, Adult, Care After Refer to this sheet in the next few weeks. These instructions provide you with information on caring for yourself after your procedure. Your health care provider may also give  you more specific instructions. Your treatment has been planned according to current medical practices, but problems sometimes occur. Call your health care provider if you have any problems or questions after your procedure. WHAT TO EXPECT AFTER THE PROCEDURE  After your procedure:  You may feel sleepy, clumsy, and have poor balance for several hours.  Vomiting may occur if you eat too soon after the procedure. HOME CARE INSTRUCTIONS  Do not participate in any activities where you could become injured for at least 24 hours. Do not:  Drive.  Swim.  Ride a bicycle.  Operate heavy machinery.  Cook.  Use power tools.  Climb ladders.  Work from a high place.  Do not make important decisions or sign legal documents until you are improved.  If you vomit, drink water, juice, or soup when you can drink without vomiting. Make sure you have little or no nausea before eating solid foods.  Only take over-the-counter or prescription medicines for pain, discomfort, or fever as directed by your health care provider.  Make sure you and your family fully understand everything about the medicines given to you, including what side effects may occur.  You should not drink alcohol, take sleeping pills, or take medicines that cause drowsiness for at least 24 hours.  If you smoke, do not smoke without supervision.  If you are feeling better, you may resume normal activities 24 hours after you were sedated.  Keep all appointments with your health care provider. SEEK MEDICAL CARE IF:  Your skin is pale or bluish in color.  You continue to feel nauseous or vomit.  Your pain is getting worse and is not helped by medicine.  You have bleeding or swelling.  You are still sleepy or feeling clumsy after 24 hours. SEEK IMMEDIATE MEDICAL CARE IF:  You develop a rash.  You have difficulty breathing.  You develop any type of allergic problem.  You have a fever. MAKE SURE YOU:  Understand  these instructions.  Will watch your condition.  Will get help right away if you are not doing well or get worse.   This information is not intended to replace advice given to you by your health care provider. Make sure you discuss any questions you have with your health care provider.   Document Released: 12/27/2012 Document Revised: 03/29/2014 Document Reviewed: 12/27/2012 Elsevier Interactive Patient Education [employer].

## 2015-02-19 NOTE — H&P (Signed)
Chief Complaint: Patient was seen in consultation today for history of multiple myeloma at the request of Neijstrom,Eric  Referring Physician(s): Neijstrom,Eric  History of Present Illness: [name] is a 69 y.o. female with history of IgA multiple myeloma diagnosed 03/2014 and has been in remission as of 08/2014 who is being followed by Dr. Tressie Stalker, last seen 01/29/2015. MRI done 11/2014 secondary to pain and now scheduled today for image guided bone marrow biopsy for re-evaluation for myeloma. She denies any chest pain, shortness of breath or palpitations. She denies any active signs of bleeding or excessive bruising. She denies any recent fever or chills. The patient denies any history of sleep apnea or chronic oxygen use. She has previously tolerated sedation without complications.    Past Medical History  Diagnosis Date  . Systolic heart failure     Chronic  . Overweight(278.02)   . Cardiomyopathy secondary   . Hypertension     takes Diovan and Spirololactone daily  . Dysrhythmia     pt takes Digoxin and Carvedilol daily  . Sleep apnea     doesn't use CPAP  . Pneumonia     hx of;many yrs ago  . Peripheral neuropathy ([place])   . Arthritis     ankles  . Gout     hx of x 1 time in Aug 2012  . GERD (gastroesophageal reflux disease)     takes Protonix seldom  . H/O hiatal hernia   . Hemorrhoid   . Anemia     takes Ferrous Fumarate 2 times week  . Blood transfusion     hx of-71yrs ago  . Thyroid nodule     right  . Diabetes mellitus     takes Glipizide and Metformin daily  . Cataract     bilateral and immature  . Left leg pain   . Chronic back pain   . Multiple myeloma (HCC)     IgA myeloma; chemo at [facility]), On[facility] MD at [facility]  . CHF (congestive heart failure) (HCC)     takes Lasix prn, Echo 2015 with EF 40%    Past Surgical History  Procedure Laterality Date  . Cardiac defirillator removed      removed <61months;removed d/t shocking pt  15times;pierced sac of heart 1000cc ;drained  . Stomach stapling surgery  80's  . Appendectomy  1991  . Tummy tuck  1991  . Leg lipoma    . Abdominal hysterectomy  1991  . Tubal ligation  1971  . Ovarian cyst removed  1991  . Cholecystectomy open  1992  . Colonoscopy    . Thyroid lobectomy  07/27/2011    Procedure: THYROID LOBECTOMY;  Surgeon: Gayland Curry, MD,FACS;  Location: [facility];  Service: General;  Laterality: Right;  . Cardiac defibrillator placement  >60yrs ago  . Defibrillator removed    . Colonoscopy N/A 02/08/2014    Procedure: COLONOSCOPY;  Surgeon: Danie Binder, MD;  [facility];  Service: Endoscopy;  Laterality: N/A;  8:30AM  . Esophagogastroduodenoscopy N/A 02/08/2014    Procedure: ESOPHAGOGASTRODUODENOSCOPY (EGD);  Surgeon: Danie Binder, MD;  [facility];  Service: Endoscopy;  Laterality: N/A;    Allergies: Doxycycline  Medications: Prior to Admission medications   Medication Sig Start Date End Date Taking? Authorizing Provider  aspirin 325 MG tablet Take 325 mg by mouth daily.   Yes Historical Provider, MD  carvedilol (COREG) 25 MG tablet Take 1 tablet (25 mg total) by  mouth 2 (two) times daily. 08/06/13 04/17/15 Yes Arnoldo Lenis, MD  CINNAMON PO Take 2 capsules by mouth every morning.    Yes Historical Provider, MD  dexamethasone (DECADRON) 4 MG tablet Take 20 mg by mouth once a week. On Wednesday 08/28/14  Yes Historical Provider, MD  Flaxseed, Linseed, (FLAX SEED OIL PO) Take 1 capsule by mouth every morning.    Yes Historical Provider, MD  HUMALOG KWIKPEN 100 UNIT/ML KiwkPen Take 5-12 Units by mouth 3 (three) times daily. 09/17/14  Yes Historical Provider, MD  levothyroxine (SYNTHROID, LEVOTHROID) 100 MCG tablet Take 100 mcg by mouth daily before breakfast.   Yes Historical Provider, MD  Multiple Vitamin (THERA) TABS Take 1 tablet by mouth. 10/30/14  Yes Historical Provider, MD  oxyCODONE-acetaminophen (PERCOCET/ROXICET) 5-325 MG per  tablet Take 1 tablet by mouth every 6 (six) hours as needed for severe pain. 10/20/14  Yes Carmin Muskrat, MD  tetrahydrozoline (EYE DROPS) 0.05 % ophthalmic solution Place 1 drop into both eyes daily as needed. For dry eye relief   Yes Historical Provider, MD  TRADJENTA 5 MG TABS tablet Take 5 mg by mouth daily.  11/21/13  Yes Historical Provider, MD  atorvastatin (LIPITOR) 40 MG tablet Take 40 mg by mouth.    Historical Provider, MD  furosemide (LASIX) 40 MG tablet Take 1 tablet (40 mg total) by mouth daily. For fluid retention Patient taking differently: Take 40 mg by mouth at bedtime. For fluid retention 10/27/13   Barton Dubois, MD  spironolactone (ALDACTONE) 25 MG tablet Take 12.5 mg by mouth daily as needed (fluid).     Historical Provider, MD     Family History  Problem Relation Age of Onset  . Anesthesia problems Neg Hx   . Hypotension Neg Hx   . Malignant hyperthermia Neg Hx   . Pseudochol deficiency Neg Hx   . Heart failure Other     Social History   Social History  . Marital Status: Widowed    Spouse Name: N/A  . Number of Children: N/A  . Years of Education: N/A   Social History Main Topics  . Smoking status: Never Smoker   . Smokeless tobacco: Never Used  . Alcohol Use: 0.0 oz/week    0 Standard drinks or equivalent per week     Comment: glass wine occasionally  . Drug Use: No  . Sexual Activity: Yes    Birth Control/ Protection: Surgical   Other Topics Concern  . None   Social History Narrative   Review of Systems: A 12 point ROS discussed and pertinent positives are indicated in the HPI above.  All other systems are negative.  Review of Systems  Vital Signs: BP 140/72 mmHg  Pulse 92  Temp(Src) 98.9 F (37.2 C) (Oral)  Resp 18  Ht $R'5\' 4"'wq$  (1.626 m)  Wt 309 lb (140.161 kg)  BMI 53.01 kg/m2  SpO2 98%  Physical Exam  Constitutional: She is oriented to person, place, and time. No distress.  HENT:  Head: Normocephalic and atraumatic.  Cardiovascular:  Normal rate and regular rhythm.  Exam reveals no gallop and no friction rub.   No murmur heard. Pulmonary/Chest: Effort normal and breath sounds normal. No respiratory distress. She has no wheezes. She has no rales.  Abdominal: Soft. Bowel sounds are normal.  Neurological: She is alert and oriented to person, place, and time.  Skin: She is not diaphoretic.    Mallampati Score:  MD Evaluation Airway: WNL Heart: WNL Abdomen: WNL Chest/ Lungs:  WNL ASA  Classification: 3 Mallampati/Airway Score: Two  Imaging: No results found.  Labs:  CBC:  Recent Labs  09/18/14 0628 09/19/14 0557 10/20/14 0948 02/19/15 0725  WBC 2.7* 2.8* 2.6* 4.4  HGB 9.2* 9.1* 8.7* 9.9*  HCT 29.7* 29.1* 27.5* 31.7*  PLT 115* 117* 119* 135*    COAGS:  Recent Labs  04/11/14 0950 09/17/14 1503 02/19/15 0725  INR 1.34 1.28 1.31  APTT $Rem'29 24 26    'xuIN$ BMP:  Recent Labs  09/17/14 1503 09/18/14 0628 10/20/14 0948  NA 137 139 139  K 4.3 3.8 3.4*  CL 100* 104 102  CO2 $Re'26 26 28  'tvn$ GLUCOSE 185* 116* 114*  BUN 35* 32* 26*  CALCIUM 8.9 8.3* 8.9  CREATININE 1.92* 1.67* 1.54*  GFRNONAA 26* 31* 34*  GFRAA 30* 36* 39*    LIVER FUNCTION TESTS:  Recent Labs  09/17/14 1503  BILITOT 0.7  AST 29  ALT 43  ALKPHOS 93  PROT 6.7  ALBUMIN 3.5    Assessment and Plan: History of IgA multiple myeloma 03/2014, remission 08/2014 History of pancytopenia Seen by Dr. Tressie Stalker 01/29/2015 MRI done 11/2014 secondary to pain Scheduled today for image guided bone marrow biopsy with sedation for re-evaluation of myeloma  The patient has been NPO, no blood thinners taken, labs and vitals have been reviewed. Risks and Benefits discussed with the patient including, but not limited to bleeding, infection, damage to adjacent structures or low yield requiring additional tests. All of the patient's questions were answered, patient is agreeable to proceed. Consent signed and in chart.    SignedHedy Jacob 02/19/2015, 8:27 AM

## 2015-02-21 ENCOUNTER — Encounter: Payer: Self-pay | Admitting: Cardiology

## 2015-02-21 ENCOUNTER — Ambulatory Visit (INDEPENDENT_AMBULATORY_CARE_PROVIDER_SITE_OTHER): Payer: Medicare Other | Admitting: Cardiology

## 2015-02-21 VITALS — BP 100/76 | HR 68 | Ht 64.0 in | Wt 305.0 lb

## 2015-02-21 DIAGNOSIS — G4730 Sleep apnea, unspecified: Secondary | ICD-10-CM

## 2015-02-21 DIAGNOSIS — I10 Essential (primary) hypertension: Secondary | ICD-10-CM | POA: Diagnosis not present

## 2015-02-21 DIAGNOSIS — I5022 Chronic systolic (congestive) heart failure: Secondary | ICD-10-CM | POA: Diagnosis not present

## 2015-02-21 NOTE — Progress Notes (Signed)
Patient ID: [name], female   DOB: 03-Sep-1945, 69 y.o.   MRN: [id number]     Clinical Summary Ms. Schul is a 69 y.o.female seen today for follow up of the following medical problems.   1. NICM  - echo 07/2013 LVEF 40-45%, mod to severe LVH.  - previously had ICD, had troubles with lead dislodgment and inappropriate shocks in the past and device was removed. Her LVEF has improved and she has not required consideration for another device.   - weight stable around 305 lbs since last visit. Breathing is stable.  - compliant with lasix.   2. HTN   - does not check regularly at home - compliant with meds  3. Dizziness/Palpitations  - symptoms have resolved since cutting back on meds  4. Multiple myeloma - followed by Dr Tressie Stalker in [place] and Dr Melba Coon at [facility].   5. CKD - followed by renal Dr Hinda Lenis  6. DM2 - followed by endocrine.   7. OSA screen - reports she had a CPAP machine several years ago but stopped using     Past Medical History  Diagnosis Date  . Systolic heart failure     Chronic  . Overweight(278.02)   . Cardiomyopathy secondary   . Hypertension     takes Diovan and Spirololactone daily  . Dysrhythmia     pt takes Digoxin and Carvedilol daily  . Sleep apnea     doesn't use CPAP  . Pneumonia     hx of;many yrs ago  . Peripheral neuropathy ([place])   . Arthritis     ankles  . Gout     hx of x 1 time in Aug 2012  . GERD (gastroesophageal reflux disease)     takes Protonix seldom  . H/O hiatal hernia   . Hemorrhoid   . Anemia     takes Ferrous Fumarate 2 times week  . Blood transfusion     hx of-11yrs ago  . Thyroid nodule     right  . Diabetes mellitus     takes Glipizide and Metformin daily  . Cataract     bilateral and immature  . Left leg pain   . Chronic back pain   . Multiple myeloma (HCC)     IgA myeloma; chemo at [facility]), Onc MD at [facility]  . CHF (congestive heart failure) ([place])     takes Lasix prn,  Echo 2015 with EF 40%     Allergies  Allergen Reactions  . Doxycycline Rash     Current Outpatient Prescriptions  Medication Sig Dispense Refill  . aspirin 325 MG tablet Take 325 mg by mouth daily.    [name] atorvastatin (LIPITOR) 40 MG tablet Take 40 mg by mouth.    . carvedilol (COREG) 25 MG tablet Take 1 tablet (25 mg total) by mouth 2 (two) times daily. 60 tablet 11  . CINNAMON PO Take 2 capsules by mouth every morning.     [name] dexamethasone (DECADRON) 4 MG tablet Take 20 mg by mouth once a week. On Wednesday    . Flaxseed, Linseed, (FLAX SEED OIL PO) Take 1 capsule by mouth every morning.     . furosemide (LASIX) 40 MG tablet Take 1 tablet (40 mg total) by mouth daily. For fluid retention (Patient taking differently: Take 40 mg by mouth at bedtime. For fluid retention) 30 tablet 1  . HUMALOG KWIKPEN 100 UNIT/ML KiwkPen Take 5-12 Units by mouth 3 (three) times daily.    [name]  levothyroxine (SYNTHROID, LEVOTHROID) 100 MCG tablet Take 100 mcg by mouth daily before breakfast.    . Multiple Vitamin (THERA) TABS Take 1 tablet by mouth.    . oxyCODONE-acetaminophen (PERCOCET/ROXICET) 5-325 MG per tablet Take 1 tablet by mouth every 6 (six) hours as needed for severe pain. 15 tablet 0  . spironolactone (ALDACTONE) 25 MG tablet Take 12.5 mg by mouth daily as needed (fluid).     [name] tetrahydrozoline (EYE DROPS) 0.05 % ophthalmic solution Place 1 drop into both eyes daily as needed. For dry eye relief    . TRADJENTA 5 MG TABS tablet Take 5 mg by mouth daily.      No current facility-administered medications for this visit.     Past Surgical History  Procedure Laterality Date  . Cardiac defirillator removed      removed <68months;removed d/t shocking pt 15times;pierced sac of heart 1000cc ;drained  . Stomach stapling surgery  80's  . Appendectomy  1991  . Tummy tuck  1991  . Leg lipoma    . Abdominal hysterectomy  1991  . Tubal ligation  1971  . Ovarian cyst removed  1991  . Cholecystectomy open   1992  . Colonoscopy    . Thyroid lobectomy  07/27/2011    Procedure: THYROID LOBECTOMY;  Surgeon: Gayland Curry, MD,FACS;  Location: [facility];  Service: General;  Laterality: Right;  . Cardiac defibrillator placement  >31yrs ago  . Defibrillator removed    . Colonoscopy N/A 02/08/2014    Procedure: COLONOSCOPY;  Surgeon: Danie Binder, MD;  [facility];  Service: Endoscopy;  Laterality: N/A;  8:30AM  . Esophagogastroduodenoscopy N/A 02/08/2014    Procedure: ESOPHAGOGASTRODUODENOSCOPY (EGD);  Surgeon: Danie Binder, MD;  [facility];  Service: Endoscopy;  Laterality: N/A;     Allergies  Allergen Reactions  . Doxycycline Rash      Family History  Problem Relation Age of Onset  . Anesthesia problems Neg Hx   . Hypotension Neg Hx   . Malignant hyperthermia Neg Hx   . Pseudochol deficiency Neg Hx   . Heart failure Other      Social History Ms. Puopolo reports that she has never smoked. She has never used smokeless tobacco. Ms. Demuro reports that she drinks alcohol.   Review of Systems CONSTITUTIONAL: No weight loss, fever, chills, weakness or fatigue.  HEENT: Eyes: No visual loss, blurred vision, double vision or yellow sclerae.No hearing loss, sneezing, congestion, runny nose or sore throat.  SKIN: No rash or itching.  CARDIOVASCULAR: per hpi RESPIRATORY: No shortness of breath, cough or sputum.  GASTROINTESTINAL: No anorexia, nausea, vomiting or diarrhea. No abdominal pain or blood.  GENITOURINARY: No burning on urination, no polyuria NEUROLOGICAL: No headache, dizziness, syncope, paralysis, ataxia, numbness or tingling in the extremities. No change in bowel or bladder control.  MUSCULOSKELETAL: No muscle, back pain, joint pain or stiffness.  LYMPHATICS: No enlarged nodes. No history of splenectomy.  PSYCHIATRIC: No history of depression or anxiety.  ENDOCRINOLOGIC: No reports of sweating, cold or heat intolerance. No polyuria or polydipsia.   [name]   Physical Examination Filed Vitals:   02/21/15 1039  BP: 100/76  Pulse: 68   Filed Vitals:   02/21/15 1039  Height: $Remove'5\' 4"'mmmQVca$  (1.626 m)  Weight: 305 lb (138.347 kg)    Gen: resting comfortably, no acute distress HEENT: no scleral icterus, pupils equal round and reactive, no palptable cervical adenopathy,  CV: RRR, no m/r/g, no jvd Resp: Clear to  auscultation bilaterally GI: abdomen is soft, non-tender, non-distended, normal bowel sounds, no hepatosplenomegaly MSK: extremities are warm, no edema.  Skin: warm, no rash Neuro:  no focal deficits Psych: appropriate affect   Diagnostic Studies 08/07/13 Echo  Study Conclusions  - Procedure narrative: Transthoracic echocardiography. Image quality was suboptimal. The study was technically difficult, as a result of poor sound wave transmission and body habitus. - Left ventricle: The cavity size was normal. Wall thickness was increased in a pattern of moderate to severe LVH. Systolic function was mildly to moderately reduced. The estimated ejection fraction was in the range of 40% to 45%. Mild to moderate global hypokinesis was seen. There was an increased relative contribution of atrial contraction to ventricular filling. Doppler parameters are consistent with abnormal left ventricular relaxation (grade 1 diastolic dysfunction). - Aortic valve: Mildly calcified annulus. Trileaflet. - Left atrium: The atrium was mildly dilated. - Right atrium: The atrium was mildly dilated.   07/2013 Event Monitor NSR, frequent PVCs     Assessment and Plan   1. NICM/Chronic systolic heart failure - echo 07/2013 LVEF 40-45%, NYHA II.  - she is euvolemic today. - no ACE or ARB due to poor renal function, we will continue her aldactone with continued monitoring of her labs.     2. HTN - continue current meds, at goal  3. OSA - refer to Dr Luan Pulling  F/u 6 months    Arnoldo Lenis, M.D.

## 2015-02-21 NOTE — Patient Instructions (Addendum)
Your physician wants you to follow-up in: 6 months with Dr Bryna Colander will receive a reminder letter in the mail two months in advance. If you don't receive a letter, please call our office to schedule the follow-up appointment.   Your physician recommends that you continue on your current medications as directed. Please refer to the Current Medication list given to you today.     If you need a refill on your cardiac medications before your next appointment, please call your pharmacy.     Try Flonase over the counter    You have been referred to pulmonary, Dr Sinda Du       Thank you for choosing [facility] !

## 2015-02-24 DIAGNOSIS — E785 Hyperlipidemia, unspecified: Secondary | ICD-10-CM | POA: Diagnosis not present

## 2015-02-24 DIAGNOSIS — E1122 Type 2 diabetes mellitus with diabetic chronic kidney disease: Secondary | ICD-10-CM | POA: Diagnosis not present

## 2015-02-24 DIAGNOSIS — C9000 Multiple myeloma not having achieved remission: Secondary | ICD-10-CM | POA: Diagnosis not present

## 2015-02-24 DIAGNOSIS — N183 Chronic kidney disease, stage 3 (moderate): Secondary | ICD-10-CM | POA: Diagnosis not present

## 2015-02-24 DIAGNOSIS — D649 Anemia, unspecified: Secondary | ICD-10-CM | POA: Diagnosis not present

## 2015-03-03 LAB — CHROMOSOME ANALYSIS, BONE MARROW

## 2015-03-03 LAB — TISSUE HYBRIDIZATION (BONE MARROW)-NCBH

## 2015-03-04 DIAGNOSIS — D61818 Other pancytopenia: Secondary | ICD-10-CM | POA: Diagnosis not present

## 2015-03-04 DIAGNOSIS — C9000 Multiple myeloma not having achieved remission: Secondary | ICD-10-CM | POA: Diagnosis not present

## 2015-03-04 DIAGNOSIS — N289 Disorder of kidney and ureter, unspecified: Secondary | ICD-10-CM | POA: Diagnosis not present

## 2015-03-04 DIAGNOSIS — D6481 Anemia due to antineoplastic chemotherapy: Secondary | ICD-10-CM | POA: Diagnosis not present

## 2015-03-06 ENCOUNTER — Ambulatory Visit: Payer: Self-pay | Admitting: Gastroenterology

## 2015-03-13 DIAGNOSIS — N189 Chronic kidney disease, unspecified: Secondary | ICD-10-CM | POA: Diagnosis not present

## 2015-03-13 DIAGNOSIS — I10 Essential (primary) hypertension: Secondary | ICD-10-CM | POA: Diagnosis not present

## 2015-03-13 DIAGNOSIS — G4730 Sleep apnea, unspecified: Secondary | ICD-10-CM | POA: Diagnosis not present

## 2015-03-13 DIAGNOSIS — I420 Dilated cardiomyopathy: Secondary | ICD-10-CM | POA: Diagnosis not present

## 2015-04-01 ENCOUNTER — Encounter (HOSPITAL_COMMUNITY): Payer: Self-pay

## 2015-04-18 DIAGNOSIS — C9001 Multiple myeloma in remission: Secondary | ICD-10-CM | POA: Diagnosis not present

## 2015-04-18 DIAGNOSIS — T451X5S Adverse effect of antineoplastic and immunosuppressive drugs, sequela: Secondary | ICD-10-CM | POA: Diagnosis not present

## 2015-04-18 DIAGNOSIS — D61818 Other pancytopenia: Secondary | ICD-10-CM | POA: Diagnosis not present

## 2015-04-18 DIAGNOSIS — C9000 Multiple myeloma not having achieved remission: Secondary | ICD-10-CM | POA: Diagnosis not present

## 2015-04-18 DIAGNOSIS — D6481 Anemia due to antineoplastic chemotherapy: Secondary | ICD-10-CM | POA: Diagnosis not present

## 2015-05-01 DIAGNOSIS — C9000 Multiple myeloma not having achieved remission: Secondary | ICD-10-CM | POA: Diagnosis not present

## 2015-05-05 DIAGNOSIS — E118 Type 2 diabetes mellitus with unspecified complications: Secondary | ICD-10-CM | POA: Diagnosis not present

## 2015-05-05 DIAGNOSIS — D649 Anemia, unspecified: Secondary | ICD-10-CM | POA: Diagnosis not present

## 2015-05-05 DIAGNOSIS — C9000 Multiple myeloma not having achieved remission: Secondary | ICD-10-CM | POA: Diagnosis not present

## 2015-05-07 DIAGNOSIS — D638 Anemia in other chronic diseases classified elsewhere: Secondary | ICD-10-CM | POA: Diagnosis not present

## 2015-05-07 DIAGNOSIS — I10 Essential (primary) hypertension: Secondary | ICD-10-CM | POA: Diagnosis not present

## 2015-05-07 DIAGNOSIS — N183 Chronic kidney disease, stage 3 (moderate): Secondary | ICD-10-CM | POA: Diagnosis not present

## 2015-05-19 DIAGNOSIS — E11319 Type 2 diabetes mellitus with unspecified diabetic retinopathy without macular edema: Secondary | ICD-10-CM | POA: Diagnosis not present

## 2015-05-19 DIAGNOSIS — H52223 Regular astigmatism, bilateral: Secondary | ICD-10-CM | POA: Diagnosis not present

## 2015-05-19 DIAGNOSIS — H5203 Hypermetropia, bilateral: Secondary | ICD-10-CM | POA: Diagnosis not present

## 2015-05-19 DIAGNOSIS — H25813 Combined forms of age-related cataract, bilateral: Secondary | ICD-10-CM | POA: Diagnosis not present

## 2015-05-27 ENCOUNTER — Ambulatory Visit: Payer: Medicare Other | Admitting: Sports Medicine

## 2015-07-08 DIAGNOSIS — D61818 Other pancytopenia: Secondary | ICD-10-CM | POA: Diagnosis not present

## 2015-07-08 DIAGNOSIS — N289 Disorder of kidney and ureter, unspecified: Secondary | ICD-10-CM | POA: Diagnosis not present

## 2015-07-08 DIAGNOSIS — G622 Polyneuropathy due to other toxic agents: Secondary | ICD-10-CM | POA: Diagnosis not present

## 2015-07-08 DIAGNOSIS — C9000 Multiple myeloma not having achieved remission: Secondary | ICD-10-CM | POA: Diagnosis not present

## 2015-07-21 DIAGNOSIS — E890 Postprocedural hypothyroidism: Secondary | ICD-10-CM | POA: Diagnosis not present

## 2015-07-21 DIAGNOSIS — E113299 Type 2 diabetes mellitus with mild nonproliferative diabetic retinopathy without macular edema, unspecified eye: Secondary | ICD-10-CM | POA: Diagnosis not present

## 2015-07-24 DIAGNOSIS — C9000 Multiple myeloma not having achieved remission: Secondary | ICD-10-CM | POA: Diagnosis not present

## 2015-07-25 ENCOUNTER — Ambulatory Visit (INDEPENDENT_AMBULATORY_CARE_PROVIDER_SITE_OTHER): Payer: Medicare Other | Admitting: Podiatry

## 2015-07-25 ENCOUNTER — Encounter: Payer: Self-pay | Admitting: Podiatry

## 2015-07-25 ENCOUNTER — Ambulatory Visit: Payer: Self-pay

## 2015-07-25 VITALS — BP 123/63 | HR 69 | Resp 16

## 2015-07-25 DIAGNOSIS — L84 Corns and callosities: Secondary | ICD-10-CM

## 2015-07-25 DIAGNOSIS — B351 Tinea unguium: Secondary | ICD-10-CM | POA: Diagnosis not present

## 2015-07-25 DIAGNOSIS — E119 Type 2 diabetes mellitus without complications: Secondary | ICD-10-CM | POA: Diagnosis not present

## 2015-07-25 DIAGNOSIS — M79676 Pain in unspecified toe(s): Secondary | ICD-10-CM | POA: Diagnosis not present

## 2015-07-25 DIAGNOSIS — G629 Polyneuropathy, unspecified: Secondary | ICD-10-CM

## 2015-07-25 DIAGNOSIS — E079 Disorder of thyroid, unspecified: Secondary | ICD-10-CM | POA: Insufficient documentation

## 2015-07-25 DIAGNOSIS — Z0189 Encounter for other specified special examinations: Secondary | ICD-10-CM

## 2015-07-25 DIAGNOSIS — Z9289 Personal history of other medical treatment: Secondary | ICD-10-CM | POA: Insufficient documentation

## 2015-07-25 NOTE — Patient Instructions (Signed)
Diabetes and Foot Care Diabetes may cause you to have problems because of poor blood supply (circulation) to your feet and legs. This may cause the skin on your feet to become thinner, break easier, and heal more slowly. Your skin may become dry, and the skin may peel and crack. You may also have nerve damage in your legs and feet causing decreased feeling in them. You may not notice minor injuries to your feet that could lead to infections or more serious problems. Taking care of your feet is one of the most important things you can do for yourself.  HOME CARE INSTRUCTIONS  Wear shoes at all times, even in the house. Do not go barefoot. Bare feet are easily injured.  Check your feet daily for blisters, cuts, and redness. If you cannot see the bottom of your feet, use a mirror or ask someone for help.  Wash your feet with warm water (do not use hot water) and mild soap. Then pat your feet and the areas between your toes until they are completely dry. Do not soak your feet as this can dry your skin.  Apply a moisturizing lotion or petroleum jelly (that does not contain alcohol and is unscented) to the skin on your feet and to dry, brittle toenails. Do not apply lotion between your toes.  Trim your toenails straight across. Do not dig under them or around the cuticle. File the edges of your nails with an emery board or nail file.  Do not cut corns or calluses or try to remove them with medicine.  Wear clean socks or stockings every day. Make sure they are not too tight. Do not wear knee-high stockings since they may decrease blood flow to your legs.  Wear shoes that fit properly and have enough cushioning. To break in new shoes, wear them for just a few hours a day. This prevents you from injuring your feet. Always look in your shoes before you put them on to be sure there are no objects inside.  Do not cross your legs. This may decrease the blood flow to your feet.  If you find a minor scrape,  cut, or break in the skin on your feet, keep it and the skin around it clean and dry. These areas may be cleansed with mild soap and water. Do not cleanse the area with peroxide, alcohol, or iodine.  When you remove an adhesive bandage, be sure not to damage the skin around it.  If you have a wound, look at it several times a day to make sure it is healing.  Do not use heating pads or hot water bottles. They may burn your skin. If you have lost feeling in your feet or legs, you may not know it is happening until it is too late.  Make sure your health care provider performs a complete foot exam at least annually or more often if you have foot problems. Report any cuts, sores, or bruises to your health care provider immediately. SEEK MEDICAL CARE IF:   You have an injury that is not healing.  You have cuts or breaks in the skin.  You have an ingrown nail.  You notice redness on your legs or feet.  You feel burning or tingling in your legs or feet.  You have pain or cramps in your legs and feet.  Your legs or feet are numb.  Your feet always feel cold. SEEK IMMEDIATE MEDICAL CARE IF:   There is increasing redness,   swelling, or pain in or around a wound.  There is a red line that goes up your leg.  Pus is coming from a wound.  You develop a fever or as directed by your health care provider.  You notice a bad smell coming from an ulcer or wound.   This information is not intended to replace advice given to you by your health care provider. Make sure you discuss any questions you have with your health care provider.   Document Released: 03/05/2000 Document Revised: 11/08/2012 Document Reviewed: 08/15/2012 Elsevier Interactive Patient Education 2016 [employer]  

## 2015-07-25 NOTE — Progress Notes (Signed)
   Subjective:    Patient ID: [name], female    DOB: 1945/05/03, 70 y.o.   MRN: [id number]  HPI 70 year old female presents the also concerns of thick, painful, elongated toenails that she cannot trim herself as well as for callus on the bottom the left big toe. She states that ever since been on chemotherapy she has noted some changes to her nails. The nails are painful particular shoe gear and pressure. Last A1c was 6.2. She also has neuropathy and she is on gabapentin. Denies any claudication symptoms.   Review of Systems  Gastrointestinal: Positive for constipation.  Endocrine: Positive for cold intolerance.  Musculoskeletal: Positive for back pain.  Neurological: Positive for numbness.  All other systems reviewed and are negative.      Objective:   Physical Exam General: AAO x3, NAD  Dermatological: Nails are hypertrophic, dystrophic, brittle, discolored, elongated 10. There is no surrounding redness or drainage. There is a hyperkeratotic lesion left plantar hallux. Upon debridement there is no underlying ulceration, drainage or other signs of infection. No other open lesions or pre-ulcer lesions identified at this time.  Vascular: Dorsalis Pedis artery and Posterior Tibial artery pedal pulses are 2/4 bilateral with immedate capillary fill time. Pedal hair growth present. No varicosities and no lower extremity edema present bilateral. There is no pain with calf compression, swelling, warmth, erythema.   Neruologic:  Sensation decreased with Derrel Nip monofilament as well as decreased vibratory sensation.  Musculoskeletal: Hammertoes are present. No pain, crepitus, or limitation noted with foot and ankle range of motion bilateral. Muscular strength 5/5 in all groups tested bilateral.  Gait: Unassisted, Nonantalgic.      Assessment & Plan:  70 year old female with symptomatic onychomycosis, hyperkeratotic lesions with neuropathy. -Treatment options discussed  including all alternatives, risks, and complications -Etiology of symptoms were discussed -Nails debrided 10 without complications or bleeding -Hyperkeratotic lesion debrided 1 without complications or bleeding. Offloading pads. -Continue gabapentin for neuropathy. -Discussed daily foot inspection. -Follow-up in 3 months or sooner if any problems arise. In the meantime, encouraged to call the office with any questions, concerns, change in symptoms.   Celesta Gentile, DPM

## 2015-08-13 DIAGNOSIS — Z0000 Encounter for general adult medical examination without abnormal findings: Secondary | ICD-10-CM | POA: Diagnosis not present

## 2015-08-13 DIAGNOSIS — E039 Hypothyroidism, unspecified: Secondary | ICD-10-CM | POA: Diagnosis not present

## 2015-08-13 DIAGNOSIS — N183 Chronic kidney disease, stage 3 (moderate): Secondary | ICD-10-CM | POA: Diagnosis not present

## 2015-08-13 DIAGNOSIS — E1122 Type 2 diabetes mellitus with diabetic chronic kidney disease: Secondary | ICD-10-CM | POA: Diagnosis not present

## 2015-08-19 DIAGNOSIS — C9000 Multiple myeloma not having achieved remission: Secondary | ICD-10-CM | POA: Diagnosis not present

## 2015-09-03 DIAGNOSIS — E559 Vitamin D deficiency, unspecified: Secondary | ICD-10-CM | POA: Diagnosis not present

## 2015-09-03 DIAGNOSIS — I129 Hypertensive chronic kidney disease with stage 1 through stage 4 chronic kidney disease, or unspecified chronic kidney disease: Secondary | ICD-10-CM | POA: Diagnosis not present

## 2015-09-03 DIAGNOSIS — R809 Proteinuria, unspecified: Secondary | ICD-10-CM | POA: Diagnosis not present

## 2015-09-03 DIAGNOSIS — D649 Anemia, unspecified: Secondary | ICD-10-CM | POA: Diagnosis not present

## 2015-09-03 DIAGNOSIS — N183 Chronic kidney disease, stage 3 (moderate): Secondary | ICD-10-CM | POA: Diagnosis not present

## 2015-09-03 DIAGNOSIS — D6481 Anemia due to antineoplastic chemotherapy: Secondary | ICD-10-CM | POA: Diagnosis not present

## 2015-09-03 DIAGNOSIS — T451X5A Adverse effect of antineoplastic and immunosuppressive drugs, initial encounter: Secondary | ICD-10-CM | POA: Diagnosis not present

## 2015-09-03 DIAGNOSIS — Z79899 Other long term (current) drug therapy: Secondary | ICD-10-CM | POA: Diagnosis not present

## 2015-09-03 DIAGNOSIS — C9000 Multiple myeloma not having achieved remission: Secondary | ICD-10-CM | POA: Diagnosis not present

## 2015-09-10 DIAGNOSIS — E559 Vitamin D deficiency, unspecified: Secondary | ICD-10-CM | POA: Diagnosis not present

## 2015-09-10 DIAGNOSIS — D638 Anemia in other chronic diseases classified elsewhere: Secondary | ICD-10-CM | POA: Diagnosis not present

## 2015-09-10 DIAGNOSIS — R809 Proteinuria, unspecified: Secondary | ICD-10-CM | POA: Diagnosis not present

## 2015-09-10 DIAGNOSIS — N183 Chronic kidney disease, stage 3 (moderate): Secondary | ICD-10-CM | POA: Diagnosis not present

## 2015-09-10 DIAGNOSIS — I10 Essential (primary) hypertension: Secondary | ICD-10-CM | POA: Diagnosis not present

## 2015-10-13 ENCOUNTER — Ambulatory Visit (INDEPENDENT_AMBULATORY_CARE_PROVIDER_SITE_OTHER): Payer: Medicare Other | Admitting: Cardiology

## 2015-10-13 ENCOUNTER — Encounter: Payer: Self-pay | Admitting: Cardiology

## 2015-10-13 VITALS — BP 140/74 | HR 78 | Ht 63.5 in | Wt 316.0 lb

## 2015-10-13 DIAGNOSIS — I5022 Chronic systolic (congestive) heart failure: Secondary | ICD-10-CM | POA: Diagnosis not present

## 2015-10-13 DIAGNOSIS — E785 Hyperlipidemia, unspecified: Secondary | ICD-10-CM | POA: Diagnosis not present

## 2015-10-13 DIAGNOSIS — I10 Essential (primary) hypertension: Secondary | ICD-10-CM

## 2015-10-13 LAB — LIPID PANEL
CHOLESTEROL: 137 mg/dL (ref 125–200)
HDL: 65 mg/dL (ref >=46)
LDL CALC: 55 mg/dL (ref <130)
Total CHOL/HDL Ratio: 2.1 Ratio (ref <=5.0)
Triglycerides: 83 mg/dL (ref <150)
VLDL: 17 mg/dL (ref <30)

## 2015-10-13 NOTE — Progress Notes (Addendum)
Clinical Summary [name] is a 70 y.o.female seen today for follow up of the following medical problems.   1. NICM  - echo 07/2013 LVEF 40-45%, mod to severe LVH.  - previously had ICD, had troubles with lead dislodgment and inappropriate shocks in the past and device was removed. Her LVEF has improved and she has not required consideration for another device.     - breathing just ok since last visit. Home weight up to 316, had been around 305 lbs. She attributes it to inactivity due to back pain. Compliant with meds.   2. HTN   -checks regularly at home, typically around 110s/60s.  - compliant with meds but has not taken meds yet today  3. Dizziness/Palpitations  - symptoms have resolved since cutting back on meds at previous visits  4. Multiple myeloma - followed by Dr Tressie Stalker in [place] and Dr Melba Coon at [facility].   5. CKD - followed by renal Dr Hinda Lenis  6. DM2 - followed by endocrine.   7. OSA screen - reports she had a CPAP machine several years ago but stopped using - seen by Dr Luan Pulling Past Medical History:  Diagnosis Date  . Anemia    takes Ferrous Fumarate 2 times week  . Arthritis    ankles  . Blood transfusion    hx of-6yr ago  . Cardiomyopathy secondary   . Cataract    bilateral and immature  . [place]    takes Lasix prn, Echo 2015 with EF 40%  . Chronic back pain   . Diabetes mellitus    takes Glipizide and Metformin daily  . Dysrhythmia    pt takes Digoxin and Carvedilol daily  . GERD (gastroesophageal reflux disease)    takes Protonix seldom  . Gout    hx of x 1 time in Aug 2012  . H/O hiatal hernia   . Hemorrhoid   . Hypertension    takes Diovan and Spirololactone daily  . Left leg pain   . Multiple myeloma (HCC)    [facility] [place]; [facility] [place] [place] . Overweight(278.02)   . [place]ipheral [place]   . Pneumonia    hx of;many yrs ago  . Sleep apnea    doesn't use CPAP  . Systolic heart failure    Chronic  . Thyroid nodule    right     Allergies  Allergen Reactions  . Doxycycline Rash     Current Outpatient Prescriptions  Medication Sig Dispense Refill  . aspirin 325 MG tablet Take 325 mg by mouth daily.    .Marland Kitchenatorvastatin (LIPITOR) 40 MG tablet Take 40 mg by mouth.    . carvedilol (COREG) 25 MG tablet Take 1 tablet (25 mg total) by mouth 2 (two) times daily. 60 tablet 11  . CINNAMON PO Take 2 capsules by mouth every morning.     . Flaxseed, Linseed, (FLAX SEED OIL PO) Take 1 capsule by mouth every morning.     . furosemide (LASIX) 40 MG tablet Take 1 tablet (40 mg total) by mouth daily. For fluid retention (Patient taking differently: Take 40 mg by mouth at bedtime. For fluid retention) 30 tablet 1  . gabapentin (NEURONTIN) 300 MG capsule     . HUMALOG KWIKPEN 100 UNIT/ML KiwkPen Take 5-12 Units by mouth 3 (three) times daily.    .Marland Kitchenlevothyroxine (SYNTHROID, LEVOTHROID) 100 MCG tablet Take 100 mcg by mouth daily before breakfast.    .  Multiple Vitamin (THERA) TABS Take 1 tablet by mouth.    . ONE TOUCH ULTRA TEST test strip     . oxyCODONE-acetaminophen (PERCOCET/ROXICET) 5-325 MG per tablet Take 1 tablet by mouth every 6 (six) hours as needed for severe pain. 15 tablet 0  . REVLIMID 5 MG capsule     . spironolactone (ALDACTONE) 25 MG tablet Take 12.5 mg by mouth daily as needed (fluid).     [name] tetrahydrozoline (EYE DROPS) 0.05 % ophthalmic solution Place 1 drop into both eyes daily as needed. For dry eye relief    . TRADJENTA 5 MG TABS tablet Take 5 mg by mouth daily.      No current facility-administered medications for this visit.      Past Surgical History:  Procedure Laterality Date  . ABDOMINAL HYSTERECTOMY  1991  . APPENDECTOMY  1991  . CARDIAC DEFIBRILLATOR PLACEMENT  >74yr ago  . Cardiac defirillator removed     removed <373monthremoved d/t shocking pt 15times;pierced sac of heart 1000cc ;drained  .  CHOLECYSTECTOMY OPEN  1992  . COLONOSCOPY    . COLONOSCOPY N/A 02/08/2014   slf: 1. NO SOURCE FOR ANEMIA IDENTIFIED. 2. SINGLE polyp removed 3. Moderate divertculosis in the sigmoid colon and descencing colon. 4. Moderate sized internal hemorrhoids.   . Marland Kitchenefibrillator removed    . ESOPHAGOGASTRODUODENOSCOPY N/A 02/08/2014   SLF: No source for anemia identified 2. Black foreign body in cardia 3. Mild erosive gastriitis (inflammaion0 was found in the gastric antrum; multiple biopsies were performed.   . leg lipoma    . ovarian cyst removed  1991  . stomach stapling surgery  80's  . THYROID LOBECTOMY  07/27/2011   Procedure: THYROID LOBECTOMY;  Surgeon: ErGayland CurryMD,FACS;  [facility] Service: General;  Laterality: Right;  . TUBAL LIGATION  1971  . tummy tuck  1991     Allergies  Allergen Reactions  . Doxycycline Rash      Family History  Problem Relation Age of Onset  . Anesthesia problems Neg Hx   . Hypotension Neg Hx   . Malignant hyperthermia Neg Hx   . Pseudochol deficiency Neg Hx   . Heart failure Other      Social History Ms. LiRitthalereports that she has never smoked. She has never used smokeless tobacco. Ms. LiNusseports that she drinks alcohol.   Review of Systems CONSTITUTIONAL: No weight loss, fever, chills, weakness or fatigue.  HEENT: Eyes: No visual loss, blurred vision, double vision or yellow sclerae.No hearing loss, sneezing, congestion, runny nose or sore throat.  SKIN: No rash or itching.  CARDIOVASCULAR: per HPI RESPIRATORY: No shortness of breath, cough or sputum.  GASTROINTESTINAL: No anorexia, nausea, vomiting or diarrhea. No abdominal pain or blood.  GENITOURINARY: No burning on urination, no polyuria NEUROLOGICAL: No headache, dizziness, syncope, paralysis, ataxia, numbness or tingling in the extremities. No change in bowel or bladder control.  MUSCULOSKELETAL: No muscle, back pain, joint pain or stiffness.  LYMPHATICS: No enlarged  nodes. No history of splenectomy.  PSYCHIATRIC: No history of depression or anxiety.  ENDOCRINOLOGIC: No reports of sweating, cold or heat intolerance. No polyuria or polydipsia.  . [name] Physical Examination Vitals:   10/13/15 0908  BP: 140/74  Pulse: 78   Vitals:   10/13/15 0908  Weight: (!) 316 lb (143.3 kg)  Height: 5' 3.5" (1.613 m)    Gen: resting comfortably, no acute distress HEENT: no scleral icterus, pupils equal round and reactive, no palptable  cervical adenopathy,  CV: RRR, no m/r/g, no jvd Resp: Clear to auscultation bilaterally GI: abdomen is soft, non-tender, non-distended, normal bowel sounds, no hepatosplenomegaly MSK: extremities are warm, trace bilateral edema Skin: warm, no rash Neuro:  no focal deficits Psych: appropriate affect   Diagnostic Studies 08/07/13 Echo  Study Conclusions  - Procedure narrative: Transthoracic echocardiography. Image quality was suboptimal. The study was technically difficult, as a result of poor sound wave transmission and body habitus. - Left ventricle: The cavity size was normal. Wall thickness was increased in a pattern of moderate to severe LVH. Systolic function was mildly to moderately reduced. The estimated ejection fraction was in the range of 40% to 45%. Mild to moderate global hypokinesis was seen. There was an increased relative contribution of atrial contraction to ventricular filling. Doppler parameters are consistent with abnormal left ventricular relaxation (grade 1 diastolic dysfunction). - Aortic valve: Mildly calcified annulus. Trileaflet. - Left atrium: The atrium was mildly dilated. - Right atrium: The atrium was mildly dilated.   07/2013 Event Monitor NSR, frequent PVCs    Assessment and Plan  1. NICM/Chronic systolic heart failure - echo 07/2013 LVEF 40-45%, NYHA II.  -recent weight gain but no significant evidence of fluid overload by exam, she reports recent inactivity and sedentary lifestyle  that may have contributed. Continue to monitor fluid status. 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  - EKG in clinic SR, PACs, occasional PVCs.  2. HTN - elevated in clinic but has not taken meds yet, she will submit bp log in 1 week.   3. Hyperlipidemia - repeat lipid panel - continue statin  4. OSA - awaiting sleep study with Dr Hawkins      F/u 6 months       Jonathan F. Branch, M.D. 

## 2015-10-13 NOTE — Patient Instructions (Signed)
Medication Instructions:  Your physician recommends that you continue on your current medications as directed. Please refer to the Current Medication list given to you today.   Labwork: Your physician recommends that you return for lab work in: asap Fasting lipid    Testing/Procedures: none  Follow-Up: Your physician wants you to follow-up in: 6 months .  You will receive a reminder letter in the mail two months in advance. If you don't receive a letter, please call our office to schedule the follow-up appointment.   Any Other Special Instructions Will Be Listed Below (If Applicable). PLEASE KEEP A BLOOD PRESSURE LOG AT HOME   If you need a refill on your cardiac medications before your next appointment, please call your pharmacy.

## 2015-10-14 DIAGNOSIS — H25813 Combined forms of age-related cataract, bilateral: Secondary | ICD-10-CM | POA: Diagnosis not present

## 2015-10-14 DIAGNOSIS — H5203 Hypermetropia, bilateral: Secondary | ICD-10-CM | POA: Diagnosis not present

## 2015-10-14 DIAGNOSIS — E11319 Type 2 diabetes mellitus with unspecified diabetic retinopathy without macular edema: Secondary | ICD-10-CM | POA: Diagnosis not present

## 2015-10-14 DIAGNOSIS — H52223 Regular astigmatism, bilateral: Secondary | ICD-10-CM | POA: Diagnosis not present

## 2015-10-24 ENCOUNTER — Encounter: Payer: Self-pay | Admitting: Podiatry

## 2015-10-24 ENCOUNTER — Ambulatory Visit (INDEPENDENT_AMBULATORY_CARE_PROVIDER_SITE_OTHER): Payer: Medicare Other | Admitting: Podiatry

## 2015-10-24 DIAGNOSIS — B351 Tinea unguium: Secondary | ICD-10-CM

## 2015-10-24 DIAGNOSIS — M79676 Pain in unspecified toe(s): Secondary | ICD-10-CM

## 2015-10-24 DIAGNOSIS — L84 Corns and callosities: Secondary | ICD-10-CM

## 2015-10-24 NOTE — Progress Notes (Signed)
This patient presents the office today for preventative foot care services. She states her nails have grown thick and long and are causing pain and discomfort walking and wearing her shoes. She also admits developing a callus under her left big toe. She admits to being a diabetic and having neuropathy in her feet. She does admit that part of the neuropathy is due to the side effects of a cancer drug. She presents the office today for an evaluation and treatment of her feet  Physical examination.  DP and PT pulses are present right foot.  No pulses noted DP and PT left foot.  Capillary return within normal limits. Temperature gradient within normal limits   Neurologic  Diminished semmes -weinstein test.  Musculoskeletal.  Hammertoes are present. Muscle power is within normal limits. No pain crepitus or limitation of motion in the feet noted  NAILS  thick disfigured discolored nails with subungual debris noted  Callus plantar callus left hallux.  Onychomycosis  Callus Left hallux    Debridement of nails.  Debridement of callus. RTC 3 months.   Gardiner Barefoot DPM

## 2015-10-30 DIAGNOSIS — R6889 Other general symptoms and signs: Secondary | ICD-10-CM | POA: Diagnosis not present

## 2015-10-30 DIAGNOSIS — C9000 Multiple myeloma not having achieved remission: Secondary | ICD-10-CM | POA: Diagnosis not present

## 2015-10-31 ENCOUNTER — Encounter (HOSPITAL_BASED_OUTPATIENT_CLINIC_OR_DEPARTMENT_OTHER): Payer: Self-pay

## 2015-11-21 ENCOUNTER — Ambulatory Visit: Payer: Medicare Other | Attending: Pulmonary Disease | Admitting: Neurology

## 2015-11-21 VITALS — Ht 64.0 in | Wt 310.0 lb

## 2015-11-21 DIAGNOSIS — G4730 Sleep apnea, unspecified: Secondary | ICD-10-CM

## 2015-11-21 DIAGNOSIS — G4733 Obstructive sleep apnea (adult) (pediatric): Secondary | ICD-10-CM | POA: Insufficient documentation

## 2015-11-26 ENCOUNTER — Other Ambulatory Visit (HOSPITAL_BASED_OUTPATIENT_CLINIC_OR_DEPARTMENT_OTHER): Payer: Self-pay

## 2015-11-26 DIAGNOSIS — G4730 Sleep apnea, unspecified: Secondary | ICD-10-CM

## 2015-12-04 NOTE — Procedures (Signed)
[place] A. Merlene Laughter, MD     [email or web address]             NOCTURNAL POLYSOMNOGRAPHY   LOCATION: [facility]    Patient Name: [name], [name] Date: 11/21/2015 Gender: Female D.O.B: Sep 15, 1945 Age (years): 70 Referring Provider: Lennox Solders Height (inches): 64 Interpreting Physician: Phillips Odor MD, ABSM Weight (lbs): 310 RPSGT: Peak, Robert BMI: 53 MRN: [id number] Neck Size: 19.00 CLINICAL INFORMATION Sleep Study Type: NPSG Indication for sleep study: N/A Epworth Sleepiness Score: 8 SLEEP STUDY TECHNIQUE As per the AASM Manual for the Scoring of Sleep and Associated Events v2.3 (April 2016) with a hypopnea requiring 4% desaturations. The channels recorded and monitored were frontal, central and occipital EEG, electrooculogram (EOG), submentalis EMG (chin), nasal and oral airflow, thoracic and abdominal wall motion, anterior tibialis EMG, snore microphone, electrocardiogram, and pulse oximetry. MEDICATIONS Patient's medications include: N/A. Medications self-administered by patient during sleep study : No sleep medicine administered.  Current Outpatient Prescriptions:  .  aspirin 325 MG tablet, Take 325 mg by mouth daily., Disp: , Rfl:  .  atorvastatin (LIPITOR) 40 MG tablet, Take 40 mg by mouth., Disp: , Rfl:  .  carvedilol (COREG) 25 MG tablet, Take 1 tablet (25 mg total) by mouth 2 (two) times daily., Disp: 60 tablet, Rfl: 11 .  CINNAMON PO, Take 2 capsules by mouth every morning. , Disp: , Rfl:  .  Flaxseed, Linseed, (FLAX SEED OIL PO), Take 1 capsule by mouth every morning. , Disp: , Rfl:  .  furosemide (LASIX) 40 MG tablet, Take 1 tablet (40 mg total) by mouth daily. For fluid retention (Patient taking differently: Take 40 mg by mouth at bedtime. For fluid retention), Disp: 30 tablet, Rfl: 1 .  gabapentin (NEURONTIN) 300 MG capsule, , Disp: , Rfl:  .  HUMALOG KWIKPEN 100 UNIT/ML KiwkPen, Take 5-12 Units by mouth 3 (three) times  daily., Disp: , Rfl:  .  levothyroxine (SYNTHROID, LEVOTHROID) 100 MCG tablet, Take 100 mcg by mouth daily before breakfast., Disp: , Rfl:  .  Multiple Vitamin (THERA) TABS, Take 1 tablet by mouth., Disp: , Rfl:  .  ONE TOUCH ULTRA TEST test strip, , Disp: , Rfl:  .  oxyCODONE-acetaminophen (PERCOCET/ROXICET) 5-325 MG per tablet, Take 1 tablet by mouth every 6 (six) hours as needed for severe pain., Disp: 15 tablet, Rfl: 0 .  spironolactone (ALDACTONE) 25 MG tablet, Take 12.5 mg by mouth daily as needed (fluid). , Disp: , Rfl:  .  tetrahydrozoline (EYE DROPS) 0.05 % ophthalmic solution, Place 1 drop into both eyes daily as needed. For dry eye relief, Disp: , Rfl:   SLEEP ARCHITECTURE The study was initiated at 11:09:57 PM and ended at 5:04:14 AM. Sleep onset time was 185.9 minutes and the sleep efficiency was 43.0%. The total sleep time was 152.4 minutes. Stage REM latency was 128.5 minutes. The patient spent 8.53% of the night in stage N1 sleep, 89.17% in stage N2 sleep, 0.00% in stage N3 and 2.30% in REM. Alpha intrusion was absent. Supine sleep was 0.00%. RESPIRATORY PARAMETERS The overall apnea/hypopnea index (AHI) was 40.6 per hour. There were 17 total apneas, including 17 obstructive, 0 central and 0 mixed apneas. There were 86 hypopneas and 1 RERAs. The AHI during Stage REM sleep was 34.3 per hour. AHI while supine was N/A per hour. The mean oxygen saturation was 94.99%. The minimum SpO2 during sleep was 87.00%. Loud snoring was noted during this study. CARDIAC DATA The 2 lead EKG demonstrated  sinus rhythm. The mean heart rate was 64.02 beats per minute. Other EKG findings include: PVCs. LEG MOVEMENT DATA The total PLMS were 0 with a resulting PLMS index of 0.00. Associated arousal with leg movement index was 0.0.    IMPRESSIONS - Severe obstructive sleep apnea. The patient colud not be titrated because of markedly reduced sleep efficiency and prolonged sleep onset. A formal CPAP  titration study is suggested. - Abnormal sleep architecture with prolonged sleep onset, absent slow wave sleep and markedly reduced sleep efficiency.   Delano Metz, MD Diplomate, American Board of Sleep Medicine.

## 2015-12-18 ENCOUNTER — Telehealth: Payer: Self-pay | Admitting: Cardiology

## 2015-12-18 NOTE — Telephone Encounter (Signed)
Pt would like to know the results from her sleep study, please give her a call @ [phone]

## 2015-12-18 NOTE — Telephone Encounter (Signed)
Called dr Maureen Chatters will call back

## 2015-12-19 ENCOUNTER — Ambulatory Visit (INDEPENDENT_AMBULATORY_CARE_PROVIDER_SITE_OTHER): Payer: Medicare Other

## 2015-12-19 ENCOUNTER — Ambulatory Visit (INDEPENDENT_AMBULATORY_CARE_PROVIDER_SITE_OTHER): Payer: Medicare Other | Admitting: Podiatry

## 2015-12-19 ENCOUNTER — Encounter: Payer: Self-pay | Admitting: Podiatry

## 2015-12-19 VITALS — Ht 63.0 in | Wt 316.0 lb

## 2015-12-19 DIAGNOSIS — B351 Tinea unguium: Secondary | ICD-10-CM | POA: Diagnosis not present

## 2015-12-19 DIAGNOSIS — M722 Plantar fascial fibromatosis: Secondary | ICD-10-CM

## 2015-12-19 DIAGNOSIS — M79671 Pain in right foot: Secondary | ICD-10-CM

## 2015-12-19 DIAGNOSIS — M79676 Pain in unspecified toe(s): Secondary | ICD-10-CM | POA: Diagnosis not present

## 2015-12-19 DIAGNOSIS — M79672 Pain in left foot: Secondary | ICD-10-CM

## 2015-12-19 DIAGNOSIS — Q6650 Congenital pes planus, unspecified foot: Secondary | ICD-10-CM

## 2015-12-19 MED ORDER — MELOXICAM 15 MG PO TABS: 15.0000 mg | ORAL_TABLET | Freq: Every day | ORAL | 0 refills | Status: DC

## 2015-12-19 NOTE — Progress Notes (Signed)
This patient presents the office today for preventative foot care services. She states her nails have grown thick and long and are causing pain and discomfort walking and wearing her shoes. She also admits the callus is no longer painful. She admits to being a diabetic and having neuropathy in her feet. She does admit that part of the neuropathy is due to the side effects of a cancer drug. She presents the office today for an evaluation and treatment of her feet.  She also says her left foot is painful when walking.  Her pain is 8 out of 10.  She points to the heel left foot.  No history of trauma.  Physical examination.  DP and PT pulses are present right foot.  No pulses noted DP and PT left foot.  Capillary return within normal limits. Temperature gradient within normal limits   Neurologic  Diminished semmes -weinstein test.  Musculoskeletal.  Hammertoes are present. Muscle power is within normal limits. No pain crepitus or limitation of motion in the feet noted.  Pes planus foot type.  Palpable pain at the insertion plantar fascia left foot.  NAILS  thick disfigured discolored nails with subungual debris noted    Onychomycosis   Plantar fascitis left foot.    Debridement of nails.  Xrays reveal severe flatfoot with minimal spurring at plantar fascia insertion.  Prescribe Mobic.  Injection therapy left heel.  RTC 2 weeks for heel reevaluation.[name] RTC 3 months for preventive footcare services.   Belenda Cruise Zeniyah Peaster DPM   Gardiner Barefoot DPM

## 2015-12-25 NOTE — Telephone Encounter (Signed)
Will forward to Dr. Branch. 

## 2015-12-26 ENCOUNTER — Other Ambulatory Visit: Payer: Self-pay

## 2015-12-26 DIAGNOSIS — G4733 Obstructive sleep apnea (adult) (pediatric): Secondary | ICD-10-CM

## 2015-12-26 NOTE — Telephone Encounter (Signed)
PUT referral in for dr. Merlene Laughter. Pt made aware.

## 2015-12-26 NOTE — Telephone Encounter (Signed)
Sleep study did show evidence of OSA. Has she seen Dr Luan Pulling or Arsenio Katz in clinic prior to having her study, if not typically they arrange for CPAP.   Zandra Abts MD

## 2016-01-01 DIAGNOSIS — C9000 Multiple myeloma not having achieved remission: Secondary | ICD-10-CM | POA: Diagnosis not present

## 2016-01-01 DIAGNOSIS — R6889 Other general symptoms and signs: Secondary | ICD-10-CM | POA: Diagnosis not present

## 2016-01-06 ENCOUNTER — Other Ambulatory Visit (HOSPITAL_BASED_OUTPATIENT_CLINIC_OR_DEPARTMENT_OTHER): Payer: Self-pay

## 2016-01-06 DIAGNOSIS — G4733 Obstructive sleep apnea (adult) (pediatric): Secondary | ICD-10-CM

## 2016-01-09 ENCOUNTER — Ambulatory Visit: Payer: Medicare Other | Admitting: Podiatry

## 2016-01-14 DIAGNOSIS — I517 Cardiomegaly: Secondary | ICD-10-CM | POA: Diagnosis not present

## 2016-01-14 DIAGNOSIS — E039 Hypothyroidism, unspecified: Secondary | ICD-10-CM | POA: Diagnosis not present

## 2016-01-14 DIAGNOSIS — G4733 Obstructive sleep apnea (adult) (pediatric): Secondary | ICD-10-CM | POA: Diagnosis not present

## 2016-01-14 DIAGNOSIS — E119 Type 2 diabetes mellitus without complications: Secondary | ICD-10-CM | POA: Diagnosis not present

## 2016-01-19 DIAGNOSIS — C9000 Multiple myeloma not having achieved remission: Secondary | ICD-10-CM | POA: Diagnosis not present

## 2016-01-19 DIAGNOSIS — N183 Chronic kidney disease, stage 3 (moderate): Secondary | ICD-10-CM | POA: Diagnosis not present

## 2016-01-19 DIAGNOSIS — E1122 Type 2 diabetes mellitus with diabetic chronic kidney disease: Secondary | ICD-10-CM | POA: Diagnosis not present

## 2016-01-23 ENCOUNTER — Ambulatory Visit (INDEPENDENT_AMBULATORY_CARE_PROVIDER_SITE_OTHER): Payer: Medicare Other | Admitting: Podiatry

## 2016-01-23 ENCOUNTER — Ambulatory Visit: Payer: Medicare Other | Attending: Pulmonary Disease | Admitting: Neurology

## 2016-01-23 ENCOUNTER — Encounter: Payer: Self-pay | Admitting: Podiatry

## 2016-01-23 VITALS — Ht 63.0 in | Wt 316.0 lb

## 2016-01-23 DIAGNOSIS — B351 Tinea unguium: Secondary | ICD-10-CM

## 2016-01-23 DIAGNOSIS — G4730 Sleep apnea, unspecified: Secondary | ICD-10-CM | POA: Insufficient documentation

## 2016-01-23 DIAGNOSIS — M79672 Pain in left foot: Secondary | ICD-10-CM

## 2016-01-23 DIAGNOSIS — G4733 Obstructive sleep apnea (adult) (pediatric): Secondary | ICD-10-CM

## 2016-01-23 DIAGNOSIS — Z7982 Long term (current) use of aspirin: Secondary | ICD-10-CM | POA: Insufficient documentation

## 2016-01-23 DIAGNOSIS — M79676 Pain in unspecified toe(s): Secondary | ICD-10-CM | POA: Diagnosis not present

## 2016-01-23 DIAGNOSIS — E039 Hypothyroidism, unspecified: Secondary | ICD-10-CM | POA: Diagnosis not present

## 2016-01-23 DIAGNOSIS — M79671 Pain in right foot: Secondary | ICD-10-CM

## 2016-01-23 NOTE — Progress Notes (Signed)
This patient presents the office today for preventative foot care services. She states her nails have grown thick and long and are causing pain and discomfort walking and wearing her shoes. She also admits the callus is no longer painful. She admits to being a diabetic and having neuropathy in her feet. She does admit that part of the neuropathy is due to the side effects of a cancer drug. She presents the office today for an evaluation and treatment of her feet.  She says her heels both feet have improved and are only painful 2 out of 10.  Physical examination.  DP and PT pulses are present right foot.  No pulses noted DP and PT left foot.  Capillary return within normal limits. Temperature gradient within normal limits   Neurologic  Diminished semmes -weinstein test.  Musculoskeletal.  Hammertoes are present. Muscle power is within normal limits. No pain crepitus or limitation of motion in the feet noted.  Pes planus foot type.  Palpable pain at the insertion plantar fascia left foot.  NAILS  thick disfigured discolored nails with subungual debris noted    Onychomycosis   Plantar fascitis left foot.    Debridement of nails.  [name] RTC 3 months for preventive footcare services.   Belenda Cruise Jim Philemon DPM   Gardiner Barefoot DPM

## 2016-01-25 NOTE — Procedures (Signed)
[place] A. Merlene Laughter, MD     [email or web address]             NOCTURNAL POLYSOMNOGRAPHY   LOCATION: [facility]  Patient Name: [place], Hosman Date: 01/23/2016 Gender: Female D.O.B: Sep 27, 1945 Age (years): 70 Referring Provider: Lennox Solders Height (inches): 64 Interpreting Physician: Phillips Odor MD, ABSM Weight (lbs): 310 RPSGT: Peak, Robert BMI: 53 MRN: [id number] Neck Size: 19.00 CLINICAL INFORMATION The patient is referred for a CPAP titration to treat sleep apnea. Date of NPSG, Split Night or HST: SLEEP STUDY TECHNIQUE As per the AASM Manual for the Scoring of Sleep and Associated Events v2.3 (April 2016) with a hypopnea requiring 4% desaturations. The channels recorded and monitored were frontal, central and occipital EEG, electrooculogram (EOG), submentalis EMG (chin), nasal and oral airflow, thoracic and abdominal wall motion, anterior tibialis EMG, snore microphone, electrocardiogram, and pulse oximetry. Continuous positive airway pressure (CPAP) was initiated at the beginning of the study and titrated to treat sleep-disordered breathing. MEDICATIONS Medications self-administered by patient taken the night of the study : N/A  Current Outpatient Prescriptions:  .  aspirin 325 MG tablet, Take 325 mg by mouth daily., Disp: , Rfl:  .  atorvastatin (LIPITOR) 40 MG tablet, Take 40 mg by mouth., Disp: , Rfl:  .  carvedilol (COREG) 25 MG tablet, Take 1 tablet (25 mg total) by mouth 2 (two) times daily., Disp: 60 tablet, Rfl: 11 .  CINNAMON PO, Take 2 capsules by mouth every morning. , Disp: , Rfl:  .  Flaxseed, Linseed, (FLAX SEED OIL PO), Take 1 capsule by mouth every morning. , Disp: , Rfl:  .  furosemide (LASIX) 40 MG tablet, Take 1 tablet (40 mg total) by mouth daily. For fluid retention (Patient taking differently: Take 40 mg by mouth at bedtime. For fluid retention), Disp: 30 tablet, Rfl: 1 .  gabapentin (NEURONTIN) 300 MG capsule, , Disp:  , Rfl:  .  HUMALOG KWIKPEN 100 UNIT/ML KiwkPen, Take 5-12 Units by mouth 3 (three) times daily., Disp: , Rfl:  .  levothyroxine (SYNTHROID, LEVOTHROID) 100 MCG tablet, Take 100 mcg by mouth daily before breakfast., Disp: , Rfl:  .  meloxicam (MOBIC) 15 MG tablet, Take 1 tablet (15 mg total) by mouth daily., Disp: 30 tablet, Rfl: 0 .  Multiple Vitamin (THERA) TABS, Take 1 tablet by mouth., Disp: , Rfl:  .  ONE TOUCH ULTRA TEST test strip, , Disp: , Rfl:  .  oxyCODONE-acetaminophen (PERCOCET/ROXICET) 5-325 MG per tablet, Take 1 tablet by mouth every 6 (six) hours as needed for severe pain., Disp: 15 tablet, Rfl: 0 .  spironolactone (ALDACTONE) 25 MG tablet, Take 12.5 mg by mouth daily as needed (fluid). , Disp: , Rfl:  .  tetrahydrozoline (EYE DROPS) 0.05 % ophthalmic solution, Place 1 drop into both eyes daily as needed. For dry eye relief, Disp: , Rfl:   TECHNICIAN COMMENTS Comments added by technician: PATIENT COMES TO SLEEP CENTER FOR CPAP TITRATION. PATIENT BEGAN STUDY WITH NASAL PILLOW MASK AND CPAP PRESSURE OF 5 CMH2O. STAGES I, II AND REM WERE OBSERVED. PATIENT SLEPT AT AN INCLINE AND BETWEEN SUPINE AND LATERAL POSITION. PATIENT HAS FREQUENT BACK PAIN. [name] NASAL PILLOW MASK WAS OPTIMAL WITH NO ORAL BREATHING NOTED. ECG SHOWED FREQUENT PVC ARRYTHMIAS. FREQUENT PVC GEMINY AND COUPLETS WERE OBSERVED. NO LEG MOVEMENTS WERE NOTED. OPTIMAL PRESSURE WAS REACHED WITH CPAP PRESSURE OF 11CMH2O USING A RESMED P10 FOR HER NASAL PILLOW MASK (XSMALL).  Comments added by scorer: N/A RESPIRATORY PARAMETERS Optimal PAP  Pressure (cm): 11 AHI at Optimal Pressure (/hr): 1.0 Overall Minimal O2 (%): 93.00 Supine % at Optimal Pressure (%): 2 Minimal O2 at Optimal Pressure (%): 93.0   SLEEP ARCHITECTURE The study was initiated at 10:13:11 PM and ended at 4:40:59 AM. Sleep onset time was 4.3 minutes and the sleep efficiency was 69.8%. The total sleep time was 270.5 minutes. The patient spent 4.81% of the night in  stage N1 sleep, 79.30% in stage N2 sleep, 0.00% in stage N3 and 15.90% in REM. Stage REM latency was 78.0 minutes Wake after sleep onset was 113.0. Alpha intrusion was absent. Supine sleep was 1.48%. CARDIAC DATA The 2 lead EKG demonstrated sinus rhythm. The mean heart rate was 68.23 beats per minute. Other EKG findings include: PVCs. LEG MOVEMENT DATA The total Periodic Limb Movements of Sleep (PLMS) were 0. The PLMS index was 0.00. A PLMS index of <15 is considered normal in adults.   IMPRESSIONS - The optimal CPAP pressure is 11 cm of water. - Absent slow wave sleep is noted.    Delano Metz, MD Diplomate, American Board of Sleep Medicine.

## 2016-01-30 DIAGNOSIS — E119 Type 2 diabetes mellitus without complications: Secondary | ICD-10-CM | POA: Diagnosis not present

## 2016-01-30 DIAGNOSIS — E890 Postprocedural hypothyroidism: Secondary | ICD-10-CM | POA: Diagnosis not present

## 2016-01-30 DIAGNOSIS — E113299 Type 2 diabetes mellitus with mild nonproliferative diabetic retinopathy without macular edema, unspecified eye: Secondary | ICD-10-CM | POA: Diagnosis not present

## 2016-02-19 DIAGNOSIS — G4733 Obstructive sleep apnea (adult) (pediatric): Secondary | ICD-10-CM | POA: Diagnosis not present

## 2016-02-24 DIAGNOSIS — R809 Proteinuria, unspecified: Secondary | ICD-10-CM | POA: Diagnosis not present

## 2016-02-24 DIAGNOSIS — E785 Hyperlipidemia, unspecified: Secondary | ICD-10-CM | POA: Diagnosis not present

## 2016-02-24 DIAGNOSIS — I10 Essential (primary) hypertension: Secondary | ICD-10-CM | POA: Diagnosis not present

## 2016-02-24 DIAGNOSIS — E559 Vitamin D deficiency, unspecified: Secondary | ICD-10-CM | POA: Diagnosis not present

## 2016-02-24 DIAGNOSIS — E1122 Type 2 diabetes mellitus with diabetic chronic kidney disease: Secondary | ICD-10-CM | POA: Diagnosis not present

## 2016-02-24 DIAGNOSIS — Z79899 Other long term (current) drug therapy: Secondary | ICD-10-CM | POA: Diagnosis not present

## 2016-02-24 DIAGNOSIS — N183 Chronic kidney disease, stage 3 (moderate): Secondary | ICD-10-CM | POA: Diagnosis not present

## 2016-02-24 DIAGNOSIS — D509 Iron deficiency anemia, unspecified: Secondary | ICD-10-CM | POA: Diagnosis not present

## 2016-03-03 DIAGNOSIS — D509 Iron deficiency anemia, unspecified: Secondary | ICD-10-CM | POA: Diagnosis not present

## 2016-03-03 DIAGNOSIS — N183 Chronic kidney disease, stage 3 (moderate): Secondary | ICD-10-CM | POA: Diagnosis not present

## 2016-03-03 DIAGNOSIS — I10 Essential (primary) hypertension: Secondary | ICD-10-CM | POA: Diagnosis not present

## 2016-03-04 ENCOUNTER — Telehealth: Payer: Self-pay

## 2016-03-04 NOTE — Telephone Encounter (Signed)
REVIEWED-NO ADDITIONAL RECOMMENDATIONS. 

## 2016-03-04 NOTE — Telephone Encounter (Signed)
Labs are back from [place] and are in [place] chair.

## 2016-03-17 ENCOUNTER — Encounter: Payer: Self-pay | Admitting: Gastroenterology

## 2016-03-21 DIAGNOSIS — G4733 Obstructive sleep apnea (adult) (pediatric): Secondary | ICD-10-CM | POA: Diagnosis not present

## 2016-04-01 DIAGNOSIS — D472 Monoclonal gammopathy: Secondary | ICD-10-CM | POA: Diagnosis not present

## 2016-04-01 DIAGNOSIS — C9000 Multiple myeloma not having achieved remission: Secondary | ICD-10-CM | POA: Diagnosis not present

## 2016-04-01 DIAGNOSIS — R6889 Other general symptoms and signs: Secondary | ICD-10-CM | POA: Diagnosis not present

## 2016-04-15 ENCOUNTER — Ambulatory Visit (INDEPENDENT_AMBULATORY_CARE_PROVIDER_SITE_OTHER): Payer: Medicare Other | Admitting: Cardiology

## 2016-04-15 ENCOUNTER — Encounter: Payer: Self-pay | Admitting: Cardiology

## 2016-04-15 VITALS — BP 118/66 | HR 45 | Ht 64.0 in | Wt 325.0 lb

## 2016-04-15 DIAGNOSIS — I5022 Chronic systolic (congestive) heart failure: Secondary | ICD-10-CM | POA: Diagnosis not present

## 2016-04-15 DIAGNOSIS — R001 Bradycardia, unspecified: Secondary | ICD-10-CM

## 2016-04-15 DIAGNOSIS — I10 Essential (primary) hypertension: Secondary | ICD-10-CM | POA: Diagnosis not present

## 2016-04-15 DIAGNOSIS — E782 Mixed hyperlipidemia: Secondary | ICD-10-CM | POA: Diagnosis not present

## 2016-04-15 NOTE — Progress Notes (Signed)
Clinical Summary Ms. Devito is a 71 y.o.female seen today for follow up of the following medical problems.   1. NICM  - echo 07/2013 LVEF 40-45%, mod to severe LVH.  - previously had ICD, had troubles with lead dislodgment and inappropriate shocks in the past and device was removed. Her LVEF has improved and she has not required consideration for another device.    - occasional SOB at times. Takes lasix prn. Occasional LE edema.  Limiting sodium intake.   2. HTN  - compliant with meds  3. Palpitaitons - only rare symptoms since last visit.    4. Multiple myeloma - followed by Dr Tressie Stalker in [place] and Dr Melba Coon at [facility].   5. CKD - followed by renal Dr Hinda Lenis  6. DM2 - followed by endocrine.   7. OSA - followed by Dr Luan Pulling - recently started CPAP machine.  - improved daytime somnolence.      SH: recent family to [facility] for Christmas.  Past Medical History:  Diagnosis Date  . Anemia    takes Ferrous Fumarate 2 times week  . Arthritis    ankles  . Blood transfusion    hx of-77yr ago  . Cardiomyopathy secondary   . Cataract    bilateral and immature  . [place]    takes Lasix prn, Echo 2015 with EF 40%  . Chronic back pain   . Diabetes mellitus    takes Glipizide and Metformin daily  . Dysrhythmia    pt takes Digoxin and Carvedilol daily  . GERD (gastroesophageal reflux disease)    takes Protonix seldom  . Gout    hx of x 1 time in Aug 2012  . H/O hiatal hernia   . Hemorrhoid   . Hypertension    takes Diovan and Spirololactone daily  . Left leg pain   . Multiple myeloma (HCC)    [place] . Overweight(278.02)   . Peripheral neuropathy ([place]   . Pneumonia    hx of;many yrs ago  . Sleep apnea    doesn't use CPAP  . Systolic heart failure    Chronic  . Thyroid nodule    right     Allergies  Allergen Reactions  . Doxycycline Rash      Current Outpatient Prescriptions  Medication Sig Dispense Refill  . aspirin 325 MG tablet Take 325 mg by mouth daily.    .Marland Kitchenatorvastatin (LIPITOR) 40 MG tablet Take 40 mg by mouth.    . carvedilol (COREG) 25 MG tablet Take 1 tablet (25 mg total) by mouth 2 (two) times daily. 60 tablet 11  . CINNAMON PO Take 2 capsules by mouth every morning.     . Flaxseed, Linseed, (FLAX SEED OIL PO) Take 1 capsule by mouth every morning.     . furosemide (LASIX) 40 MG tablet Take 1 tablet (40 mg total) by mouth daily. For fluid retention (Patient taking differently: Take 40 mg by mouth at bedtime. For fluid retention) 30 tablet 1  . gabapentin (NEURONTIN) 300 MG capsule     . HUMALOG KWIKPEN 100 UNIT/ML KiwkPen Take 5-12 Units by mouth 3 (three) times daily.    .Marland Kitchenlevothyroxine (SYNTHROID, LEVOTHROID) 100 MCG tablet Take 100 mcg by mouth daily before breakfast.    . meloxicam (MOBIC) 15 MG tablet Take 1 tablet (15 mg total) by mouth daily. 30 tablet 0  . Multiple Vitamin (  THERA) TABS Take 1 tablet by mouth.    . ONE TOUCH ULTRA TEST test strip     . oxyCODONE-acetaminophen (PERCOCET/ROXICET) 5-325 MG per tablet Take 1 tablet by mouth every 6 (six) hours as needed for severe pain. 15 tablet 0  . spironolactone (ALDACTONE) 25 MG tablet Take 12.5 mg by mouth daily as needed (fluid).     [name] tetrahydrozoline (EYE DROPS) 0.05 % ophthalmic solution Place 1 drop into both eyes daily as needed. For dry eye relief     No current facility-administered medications for this visit.      Past Surgical History:  Procedure Laterality Date  . ABDOMINAL HYSTERECTOMY  1991  . APPENDECTOMY  1991  . CARDIAC DEFIBRILLATOR PLACEMENT  >109yr ago  . Cardiac defirillator removed     removed <345monthremoved d/t shocking pt 15times;pierced sac of heart 1000cc ;drained  . CHOLECYSTECTOMY OPEN  1992  . COLONOSCOPY    . COLONOSCOPY N/A 02/08/2014   slf: 1. NO SOURCE FOR ANEMIA IDENTIFIED. 2. SINGLE polyp removed 3. Moderate  divertculosis in the sigmoid colon and descencing colon. 4. Moderate sized internal hemorrhoids.   . Marland Kitchenefibrillator removed    . ESOPHAGOGASTRODUODENOSCOPY N/A 02/08/2014   SLF: No source for anemia identified 2. Black foreign body in cardia 3. Mild erosive gastriitis (inflammaion0 was found in the gastric antrum; multiple biopsies were performed.   . leg lipoma    . ovarian cyst removed  1991  . stomach stapling surgery  80's  . THYROID LOBECTOMY  07/27/2011   Procedure: THYROID LOBECTOMY;  Surgeon: ErGayland CurryMD,FACS;  [place] [facility]: General;  Laterality: Right;  . TUBAL LIGATION  1971  . tummy tuck  1991     Allergies  Allergen Reactions  . Doxycycline Rash      Family History  Problem Relation Age of Onset  . Heart failure Other   . Anesthesia problems Neg Hx   . Hypotension Neg Hx   . Malignant hyperthermia Neg Hx   . Pseudochol deficiency Neg Hx      Social History Ms. LiGeddeseports that she has never smoked. She has never used smokeless tobacco. Ms. LiRotenbergeports that she drinks alcohol.   Review of Systems CONSTITUTIONAL: No weight loss, fever, chills, weakness or fatigue.  HEENT: Eyes: No visual loss, blurred vision, double vision or yellow sclerae.No hearing loss, sneezing, congestion, runny nose or sore throat.  SKIN: No rash or itching.  CARDIOVASCULAR: per hpi RESPIRATORY: No shortness of breath, cough or sputum.  GASTROINTESTINAL: No anorexia, nausea, vomiting or diarrhea. No abdominal pain or blood.  GENITOURINARY: No burning on urination, no polyuria NEUROLOGICAL: No headache, dizziness, syncope, paralysis, ataxia, numbness or tingling in the extremities. No change in bowel or bladder control.  MUSCULOSKELETAL: No muscle, back pain, joint pain or stiffness.  LYMPHATICS: No enlarged nodes. No history of splenectomy.  PSYCHIATRIC: No history of depression or anxiety.  ENDOCRINOLOGIC: No reports of sweating, cold or heat intolerance. No  polyuria or polydipsia.  . [name] Physical Examination Vitals:   04/15/16 0906  BP: 118/66  Pulse: (!) 45   Vitals:   04/15/16 0906  Weight: (!) 325 lb (147.4 kg)  Height: '5\' 4"'$  (1.626 m)    Gen: resting comfortably, no acute distress HEENT: no scleral icterus, pupils equal round and reactive, no palptable cervical adenopathy,  CV: RRR, no m/r/g, no jvd Resp: Clear to auscultation bilaterally GI: abdomen is soft, non-tender, non-distended, normal bowel sounds, no hepatosplenomegaly MSK:  extremities are warm, no edema.  Skin: warm, no rash Neuro:  no focal deficits Psych: appropriate affect   Diagnostic Studies 08/07/13 Echo Study Conclusions  - Procedure narrative: Transthoracic echocardiography. Image quality was suboptimal. The study was technically difficult, as a result of poor sound wave transmission and body habitus. - Left ventricle: The cavity size was normal. Wall thickness was increased in a pattern of moderate to severe LVH. Systolic function was mildly to moderately reduced. The estimated ejection fraction was in the range of 40% to 45%. Mild to moderate global hypokinesis was seen. There was an increased relative contribution of atrial contraction to ventricular filling. Doppler parameters are consistent with abnormal left ventricular relaxation (grade 1 diastolic dysfunction). - Aortic valve: Mildly calcified annulus. Trileaflet. - Left atrium: The atrium was mildly dilated. - Right atrium: The atrium was mildly dilated.  07/2013 Event Monitor NSR, frequent PVCs      Assessment and Plan   1. NICM/Chronic systolic heart failure - echo 07/2013 LVEF 40-45%, NYHA II.  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  - no recent symptoms, continue current meds  2. HTN - at goal, continue current meds  3. Hyperlipidemia - continue statin  4. OSA - per Dr Luan Pulling  5. Bradycardia - low heart rate by pulse  check, EKG in clinic shows NSR with normal rates.  - continue to monitor  F/u 6 monhts  Arnoldo Lenis, M.D

## 2016-04-15 NOTE — Patient Instructions (Signed)

## 2016-04-21 DIAGNOSIS — G4733 Obstructive sleep apnea (adult) (pediatric): Secondary | ICD-10-CM | POA: Diagnosis not present

## 2016-04-23 ENCOUNTER — Ambulatory Visit: Payer: Medicare Other | Admitting: Podiatry

## 2016-04-27 DIAGNOSIS — E1122 Type 2 diabetes mellitus with diabetic chronic kidney disease: Secondary | ICD-10-CM | POA: Diagnosis not present

## 2016-04-27 DIAGNOSIS — E039 Hypothyroidism, unspecified: Secondary | ICD-10-CM | POA: Diagnosis not present

## 2016-04-27 DIAGNOSIS — N183 Chronic kidney disease, stage 3 (moderate): Secondary | ICD-10-CM | POA: Diagnosis not present

## 2016-04-27 DIAGNOSIS — C9000 Multiple myeloma not having achieved remission: Secondary | ICD-10-CM | POA: Diagnosis not present

## 2016-04-30 ENCOUNTER — Ambulatory Visit (INDEPENDENT_AMBULATORY_CARE_PROVIDER_SITE_OTHER): Payer: Medicare Other | Admitting: Podiatry

## 2016-04-30 ENCOUNTER — Encounter: Payer: Self-pay | Admitting: Podiatry

## 2016-04-30 DIAGNOSIS — B351 Tinea unguium: Secondary | ICD-10-CM

## 2016-04-30 DIAGNOSIS — M79676 Pain in unspecified toe(s): Secondary | ICD-10-CM | POA: Diagnosis not present

## 2016-04-30 DIAGNOSIS — Q6650 Congenital pes planus, unspecified foot: Secondary | ICD-10-CM

## 2016-04-30 DIAGNOSIS — G629 Polyneuropathy, unspecified: Secondary | ICD-10-CM

## 2016-04-30 NOTE — Progress Notes (Signed)
This patient presents the office today for preventative foot care services. She states her nails have grown thick and long and are causing pain and discomfort walking and wearing her shoes. She also admits the callus is no longer painful. She admits to being a diabetic and having neuropathy in her feet. She does admit that part of the neuropathy is due to the side effects of a cancer drug. She presents the office today for an evaluation and treatment of her feet.    Physical examination.  DP pulses palpable  B/L.  PT pulses non palpable due to swelling  Capillary return within normal limits. Temperature gradient within normal limits   Neurologic  Diminished semmes -weinstein test.  Musculoskeletal.  Hammertoes are present. Muscle power is within normal limits. No pain crepitus or limitation of motion in the feet noted.  Pes planus foot type. 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  [name] RTC 3 months for preventive footcare services.      Gardiner Barefoot DPM

## 2016-05-05 DIAGNOSIS — G4733 Obstructive sleep apnea (adult) (pediatric): Secondary | ICD-10-CM | POA: Diagnosis not present

## 2016-05-05 DIAGNOSIS — E039 Hypothyroidism, unspecified: Secondary | ICD-10-CM | POA: Diagnosis not present

## 2016-05-05 DIAGNOSIS — I517 Cardiomegaly: Secondary | ICD-10-CM | POA: Diagnosis not present

## 2016-05-17 DIAGNOSIS — E1169 Type 2 diabetes mellitus with other specified complication: Secondary | ICD-10-CM | POA: Diagnosis not present

## 2016-05-17 DIAGNOSIS — C9002 Multiple myeloma in relapse: Secondary | ICD-10-CM | POA: Diagnosis not present

## 2016-05-17 DIAGNOSIS — Z794 Long term (current) use of insulin: Secondary | ICD-10-CM | POA: Diagnosis not present

## 2016-05-17 DIAGNOSIS — C9000 Multiple myeloma not having achieved remission: Secondary | ICD-10-CM | POA: Diagnosis not present

## 2016-05-19 DIAGNOSIS — G4733 Obstructive sleep apnea (adult) (pediatric): Secondary | ICD-10-CM | POA: Diagnosis not present

## 2016-05-24 DIAGNOSIS — Z452 Encounter for adjustment and management of vascular access device: Secondary | ICD-10-CM | POA: Diagnosis not present

## 2016-05-24 DIAGNOSIS — C9000 Multiple myeloma not having achieved remission: Secondary | ICD-10-CM | POA: Diagnosis not present

## 2016-05-25 DIAGNOSIS — C9000 Multiple myeloma not having achieved remission: Secondary | ICD-10-CM | POA: Diagnosis not present

## 2016-05-25 DIAGNOSIS — Z8639 Personal history of other endocrine, nutritional and metabolic disease: Secondary | ICD-10-CM | POA: Diagnosis not present

## 2016-05-27 DIAGNOSIS — Z5111 Encounter for antineoplastic chemotherapy: Secondary | ICD-10-CM | POA: Diagnosis not present

## 2016-05-27 DIAGNOSIS — C9000 Multiple myeloma not having achieved remission: Secondary | ICD-10-CM | POA: Diagnosis not present

## 2016-06-03 DIAGNOSIS — C9000 Multiple myeloma not having achieved remission: Secondary | ICD-10-CM | POA: Diagnosis not present

## 2016-06-03 DIAGNOSIS — Z5111 Encounter for antineoplastic chemotherapy: Secondary | ICD-10-CM | POA: Diagnosis not present

## 2016-06-04 DIAGNOSIS — C9000 Multiple myeloma not having achieved remission: Secondary | ICD-10-CM | POA: Diagnosis not present

## 2016-06-04 DIAGNOSIS — Z5111 Encounter for antineoplastic chemotherapy: Secondary | ICD-10-CM | POA: Diagnosis not present

## 2016-06-10 DIAGNOSIS — C9000 Multiple myeloma not having achieved remission: Secondary | ICD-10-CM | POA: Diagnosis not present

## 2016-06-10 DIAGNOSIS — Z5111 Encounter for antineoplastic chemotherapy: Secondary | ICD-10-CM | POA: Diagnosis not present

## 2016-06-17 DIAGNOSIS — Z5111 Encounter for antineoplastic chemotherapy: Secondary | ICD-10-CM | POA: Diagnosis not present

## 2016-06-17 DIAGNOSIS — C9000 Multiple myeloma not having achieved remission: Secondary | ICD-10-CM | POA: Diagnosis not present

## 2016-06-19 ENCOUNTER — Emergency Department (HOSPITAL_COMMUNITY): Payer: Medicare Other

## 2016-06-19 ENCOUNTER — Encounter (HOSPITAL_COMMUNITY): Payer: Self-pay | Admitting: *Deleted

## 2016-06-19 ENCOUNTER — Inpatient Hospital Stay (HOSPITAL_COMMUNITY)
Admission: EM | Admit: 2016-06-19 | Discharge: 2016-06-24 | DRG: 291 | Disposition: A | Discharge status: Home / Self Care | Payer: Medicare Other | Attending: Internal Medicine | Admitting: Internal Medicine

## 2016-06-19 DIAGNOSIS — Z90710 Acquired absence of both cervix and uterus: Secondary | ICD-10-CM

## 2016-06-19 DIAGNOSIS — I5022 Chronic systolic (congestive) heart failure: Secondary | ICD-10-CM

## 2016-06-19 DIAGNOSIS — I130 Hypertensive heart and chronic kidney disease with heart failure and stage 1 through stage 4 chronic kidney disease, or unspecified chronic kidney disease: Principal | ICD-10-CM | POA: Diagnosis present

## 2016-06-19 DIAGNOSIS — N183 Chronic kidney disease, stage 3 unspecified: Secondary | ICD-10-CM | POA: Diagnosis present

## 2016-06-19 DIAGNOSIS — D61818 Other pancytopenia: Secondary | ICD-10-CM | POA: Diagnosis present

## 2016-06-19 DIAGNOSIS — Z79899 Other long term (current) drug therapy: Secondary | ICD-10-CM

## 2016-06-19 DIAGNOSIS — Z794 Long term (current) use of insulin: Secondary | ICD-10-CM

## 2016-06-19 DIAGNOSIS — Z8249 Family history of ischemic heart disease and other diseases of the circulatory system: Secondary | ICD-10-CM | POA: Diagnosis not present

## 2016-06-19 DIAGNOSIS — E039 Hypothyroidism, unspecified: Secondary | ICD-10-CM | POA: Diagnosis not present

## 2016-06-19 DIAGNOSIS — D708 Other neutropenia: Secondary | ICD-10-CM | POA: Diagnosis not present

## 2016-06-19 DIAGNOSIS — J9600 Acute respiratory failure, unspecified whether with hypoxia or hypercapnia: Secondary | ICD-10-CM | POA: Diagnosis not present

## 2016-06-19 DIAGNOSIS — R05 Cough: Secondary | ICD-10-CM | POA: Diagnosis not present

## 2016-06-19 DIAGNOSIS — I5023 Acute on chronic systolic (congestive) heart failure: Secondary | ICD-10-CM | POA: Diagnosis not present

## 2016-06-19 DIAGNOSIS — J181 Lobar pneumonia, unspecified organism: Secondary | ICD-10-CM | POA: Diagnosis not present

## 2016-06-19 DIAGNOSIS — K219 Gastro-esophageal reflux disease without esophagitis: Secondary | ICD-10-CM | POA: Diagnosis not present

## 2016-06-19 DIAGNOSIS — D709 Neutropenia, unspecified: Secondary | ICD-10-CM

## 2016-06-19 DIAGNOSIS — G4733 Obstructive sleep apnea (adult) (pediatric): Secondary | ICD-10-CM | POA: Diagnosis present

## 2016-06-19 DIAGNOSIS — R059 Cough, unspecified: Secondary | ICD-10-CM

## 2016-06-19 DIAGNOSIS — E1122 Type 2 diabetes mellitus with diabetic chronic kidney disease: Secondary | ICD-10-CM | POA: Diagnosis present

## 2016-06-19 DIAGNOSIS — J209 Acute bronchitis, unspecified: Secondary | ICD-10-CM

## 2016-06-19 DIAGNOSIS — J189 Pneumonia, unspecified organism: Secondary | ICD-10-CM

## 2016-06-19 DIAGNOSIS — E118 Type 2 diabetes mellitus with unspecified complications: Secondary | ICD-10-CM

## 2016-06-19 DIAGNOSIS — I10 Essential (primary) hypertension: Secondary | ICD-10-CM | POA: Diagnosis not present

## 2016-06-19 DIAGNOSIS — I428 Other cardiomyopathies: Secondary | ICD-10-CM | POA: Diagnosis present

## 2016-06-19 DIAGNOSIS — C9000 Multiple myeloma not having achieved remission: Secondary | ICD-10-CM | POA: Diagnosis not present

## 2016-06-19 DIAGNOSIS — R0602 Shortness of breath: Secondary | ICD-10-CM | POA: Diagnosis not present

## 2016-06-19 DIAGNOSIS — Z95810 Presence of automatic (implantable) cardiac defibrillator: Secondary | ICD-10-CM

## 2016-06-19 DIAGNOSIS — I519 Heart disease, unspecified: Secondary | ICD-10-CM | POA: Diagnosis not present

## 2016-06-19 DIAGNOSIS — I517 Cardiomegaly: Secondary | ICD-10-CM | POA: Diagnosis not present

## 2016-06-19 DIAGNOSIS — J208 Acute bronchitis due to other specified organisms: Secondary | ICD-10-CM | POA: Diagnosis not present

## 2016-06-19 DIAGNOSIS — Z9221 Personal history of antineoplastic chemotherapy: Secondary | ICD-10-CM | POA: Diagnosis not present

## 2016-06-19 DIAGNOSIS — I509 Heart failure, unspecified: Secondary | ICD-10-CM | POA: Diagnosis not present

## 2016-06-19 LAB — BASIC METABOLIC PANEL
ANION GAP: 9 (ref 5–15)
BUN: 23 mg/dL — ABNORMAL HIGH (ref 6–20)
CALCIUM: 7.7 mg/dL — AB (ref 8.9–10.3)
CO2: 21 mmol/L — AB (ref 22–32)
CREATININE: 1.44 mg/dL — AB (ref 0.44–1.00)
Chloride: 103 mmol/L (ref 101–111)
GFR, EST AFRICAN AMERICAN: 42 mL/min — AB (ref >60)
GFR, EST NON AFRICAN AMERICAN: 36 mL/min — AB (ref >60)
GLUCOSE: 109 mg/dL — AB (ref 65–99)
Potassium: 4.4 mmol/L (ref 3.5–5.1)
Sodium: 133 mmol/L — ABNORMAL LOW (ref 135–145)

## 2016-06-19 LAB — CBC WITH DIFFERENTIAL/PLATELET
BASOS ABS: 0 10*3/uL (ref 0.0–0.1)
BASOS PCT: 0 %
EOS ABS: 0 10*3/uL (ref 0.0–0.7)
Eosinophils Relative: 7 %
HCT: 31.9 % — ABNORMAL LOW (ref 36.0–46.0)
Hemoglobin: 10 g/dL — ABNORMAL LOW (ref 12.0–15.0)
LYMPHS ABS: 0.2 10*3/uL — AB (ref 0.7–4.0)
Lymphocytes Relative: 55 %
MCH: 25.3 pg — ABNORMAL LOW (ref 26.0–34.0)
MCHC: 31.3 g/dL (ref 30.0–36.0)
MCV: 80.8 fL (ref 78.0–100.0)
Monocytes Absolute: 0.1 10*3/uL (ref 0.1–1.0)
Monocytes Relative: 28 %
NEUTROS ABS: 0 10*3/uL — AB (ref 1.7–7.7)
Neutrophils Relative %: 10 %
PLATELETS: 65 10*3/uL — AB (ref 150–400)
RBC: 3.95 MIL/uL (ref 3.87–5.11)
RDW: 18.7 % — AB (ref 11.5–15.5)
WBC: 0.3 10*3/uL — CL (ref 4.0–10.5)

## 2016-06-19 LAB — GLUCOSE, CAPILLARY
GLUCOSE-CAPILLARY: 226 mg/dL — AB (ref 65–99)
GLUCOSE-CAPILLARY: 309 mg/dL — AB (ref 65–99)

## 2016-06-19 LAB — STREP PNEUMONIAE URINARY ANTIGEN: STREP PNEUMO URINARY ANTIGEN: NEGATIVE

## 2016-06-19 LAB — PROCALCITONIN: PROCALCITONIN: 0.19 ng/mL

## 2016-06-19 LAB — I-STAT CG4 LACTIC ACID, ED: Lactic Acid, Venous: 1.08 mmol/L (ref 0.5–1.9)

## 2016-06-19 LAB — MRSA PCR SCREENING: MRSA BY PCR: NEGATIVE

## 2016-06-19 LAB — BRAIN NATRIURETIC PEPTIDE: B Natriuretic Peptide: 199 pg/mL — ABNORMAL HIGH (ref 0.0–100.0)

## 2016-06-19 MED ORDER — ALBUTEROL SULFATE (2.5 MG/3ML) 0.083% IN NEBU
INHALATION_SOLUTION | RESPIRATORY_TRACT | Status: AC
  Administered 2016-06-19: 2.5 mg via RESPIRATORY_TRACT
  Filled 2016-06-19: qty 3

## 2016-06-19 MED ORDER — ALBUTEROL SULFATE (2.5 MG/3ML) 0.083% IN NEBU: 2.5000 mg | INHALATION_SOLUTION | RESPIRATORY_TRACT | Status: DC | PRN

## 2016-06-19 MED ORDER — INSULIN GLARGINE 100 UNIT/ML ~~LOC~~ SOLN
10.0000 [IU] | Freq: Every day | SUBCUTANEOUS | Status: DC
  Administered 2016-06-19 – 2016-06-20 (×2): 10 [IU] via SUBCUTANEOUS
  Filled 2016-06-19 (×3): qty 0.1

## 2016-06-19 MED ORDER — CEFEPIME HCL 2 G IJ SOLR
2.0000 g | Freq: Once | INTRAMUSCULAR | Status: AC
  Administered 2016-06-19: 2 g via INTRAVENOUS
  Filled 2016-06-19: qty 2

## 2016-06-19 MED ORDER — GUAIFENESIN-DM 100-10 MG/5ML PO SYRP
5.0000 mL | ORAL_SOLUTION | ORAL | Status: DC | PRN
  Administered 2016-06-19 – 2016-06-24 (×6): 5 mL via ORAL
  Filled 2016-06-19 (×6): qty 5

## 2016-06-19 MED ORDER — NAPHAZOLINE-GLYCERIN 0.012-0.2 % OP SOLN
1.0000 [drp] | Freq: Four times a day (QID) | OPHTHALMIC | Status: DC | PRN
  Filled 2016-06-19: qty 15

## 2016-06-19 MED ORDER — OXYCODONE-ACETAMINOPHEN 5-325 MG PO TABS: 1.0000 | ORAL_TABLET | Freq: Four times a day (QID) | ORAL | Status: DC | PRN

## 2016-06-19 MED ORDER — MELOXICAM 7.5 MG PO TABS
15.0000 mg | ORAL_TABLET | Freq: Every day | ORAL | Status: DC
  Filled 2016-06-19 (×3): qty 1

## 2016-06-19 MED ORDER — METHYLPREDNISOLONE SODIUM SUCC 125 MG IJ SOLR
125.0000 mg | Freq: Once | INTRAMUSCULAR | Status: AC
  Administered 2016-06-19: 125 mg via INTRAVENOUS
  Filled 2016-06-19: qty 2

## 2016-06-19 MED ORDER — FUROSEMIDE 40 MG PO TABS
40.0000 mg | ORAL_TABLET | Freq: Every day | ORAL | Status: DC
  Administered 2016-06-19 – 2016-06-20 (×2): 40 mg via ORAL
  Filled 2016-06-19 (×2): qty 1

## 2016-06-19 MED ORDER — IPRATROPIUM-ALBUTEROL 0.5-2.5 (3) MG/3ML IN SOLN
RESPIRATORY_TRACT | Status: AC
  Administered 2016-06-19: 3 mL via RESPIRATORY_TRACT
  Filled 2016-06-19: qty 3

## 2016-06-19 MED ORDER — INSULIN ASPART 100 UNIT/ML ~~LOC~~ SOLN
0.0000 [IU] | Freq: Three times a day (TID) | SUBCUTANEOUS | Status: DC
  Administered 2016-06-19 – 2016-06-21 (×4): 7 [IU] via SUBCUTANEOUS
  Administered 2016-06-21: 11 [IU] via SUBCUTANEOUS
  Administered 2016-06-22 (×2): 4 [IU] via SUBCUTANEOUS
  Administered 2016-06-22 – 2016-06-23 (×2): 7 [IU] via SUBCUTANEOUS
  Administered 2016-06-23 – 2016-06-24 (×3): 4 [IU] via SUBCUTANEOUS

## 2016-06-19 MED ORDER — ALBUTEROL SULFATE (2.5 MG/3ML) 0.083% IN NEBU
2.5000 mg | INHALATION_SOLUTION | Freq: Once | RESPIRATORY_TRACT | Status: AC
  Administered 2016-06-19: 2.5 mg via RESPIRATORY_TRACT

## 2016-06-19 MED ORDER — TIOTROPIUM BROMIDE MONOHYDRATE 18 MCG IN CAPS
18.0000 ug | ORAL_CAPSULE | Freq: Every day | RESPIRATORY_TRACT | Status: DC
  Administered 2016-06-20: 18 ug via RESPIRATORY_TRACT
  Filled 2016-06-19: qty 5

## 2016-06-19 MED ORDER — IPRATROPIUM-ALBUTEROL 0.5-2.5 (3) MG/3ML IN SOLN
3.0000 mL | Freq: Once | RESPIRATORY_TRACT | Status: AC
  Administered 2016-06-19: 3 mL via RESPIRATORY_TRACT

## 2016-06-19 MED ORDER — METHYLPREDNISOLONE SODIUM SUCC 125 MG IJ SOLR
60.0000 mg | Freq: Four times a day (QID) | INTRAMUSCULAR | Status: DC
  Administered 2016-06-19 – 2016-06-21 (×8): 60 mg via INTRAVENOUS
  Filled 2016-06-19 (×8): qty 2

## 2016-06-19 MED ORDER — FERROUS SULFATE 325 (65 FE) MG PO TABS
325.0000 mg | ORAL_TABLET | Freq: Two times a day (BID) | ORAL | Status: DC
  Administered 2016-06-19 – 2016-06-24 (×10): 325 mg via ORAL
  Filled 2016-06-19 (×10): qty 1

## 2016-06-19 MED ORDER — ALBUTEROL SULFATE (2.5 MG/3ML) 0.083% IN NEBU
2.5000 mg | INHALATION_SOLUTION | RESPIRATORY_TRACT | Status: DC
  Administered 2016-06-19 – 2016-06-20 (×5): 2.5 mg via RESPIRATORY_TRACT
  Filled 2016-06-19 (×5): qty 3

## 2016-06-19 MED ORDER — LEVOTHYROXINE SODIUM 100 MCG PO TABS
100.0000 ug | ORAL_TABLET | Freq: Every day | ORAL | Status: DC
  Administered 2016-06-20 – 2016-06-24 (×5): 100 ug via ORAL
  Filled 2016-06-19 (×5): qty 1

## 2016-06-19 MED ORDER — ATORVASTATIN CALCIUM 40 MG PO TABS
40.0000 mg | ORAL_TABLET | Freq: Every day | ORAL | Status: DC
Start: 2016-06-19 — End: 2016-06-24
  Administered 2016-06-19 – 2016-06-23 (×5): 40 mg via ORAL
  Filled 2016-06-19 (×5): qty 1

## 2016-06-19 MED ORDER — SPIRONOLACTONE 25 MG PO TABS
12.5000 mg | ORAL_TABLET | Freq: Every day | ORAL | Status: DC | PRN
Start: 2016-06-19 — End: 2016-06-24

## 2016-06-19 MED ORDER — DEXTROSE 5 % IV SOLN
2.0000 g | Freq: Three times a day (TID) | INTRAVENOUS | Status: DC
  Administered 2016-06-19 – 2016-06-23 (×11): 2 g via INTRAVENOUS
  Filled 2016-06-19 (×15): qty 2

## 2016-06-19 MED ORDER — DEXTROSE 5 % IV SOLN
500.0000 mg | INTRAVENOUS | Status: DC
  Administered 2016-06-19 – 2016-06-23 (×4): 500 mg via INTRAVENOUS
  Filled 2016-06-19 (×5): qty 500

## 2016-06-19 MED ORDER — CEFEPIME HCL 2 G IJ SOLR
INTRAMUSCULAR | Status: AC
  Filled 2016-06-19: qty 2

## 2016-06-19 MED ORDER — SODIUM CHLORIDE 0.9 % IV BOLUS (SEPSIS)
500.0000 mL | Freq: Once | INTRAVENOUS | Status: AC
  Administered 2016-06-19: 500 mL via INTRAVENOUS

## 2016-06-19 MED ORDER — AZITHROMYCIN 500 MG IV SOLR
INTRAVENOUS | Status: AC
  Filled 2016-06-19: qty 500

## 2016-06-19 MED ORDER — CARVEDILOL 12.5 MG PO TABS
25.0000 mg | ORAL_TABLET | Freq: Two times a day (BID) | ORAL | Status: DC
  Administered 2016-06-19 – 2016-06-24 (×10): 25 mg via ORAL
  Filled 2016-06-19 (×10): qty 2

## 2016-06-19 MED ORDER — INSULIN ASPART 100 UNIT/ML ~~LOC~~ SOLN
10.0000 [IU] | Freq: Three times a day (TID) | SUBCUTANEOUS | Status: DC
  Administered 2016-06-20 – 2016-06-24 (×11): 10 [IU] via SUBCUTANEOUS

## 2016-06-19 NOTE — ED Provider Notes (Signed)
[facility] DEPT Provider Note   CSN: [id number] Arrival date & time: 06/19/16  1222   By signing my name below, I, Eunice Blase, attest that this documentation has been prepared under the direction and in the presence of Daleen Bo, MD. Electronically signed, Eunice Blase, ED Scribe. 06/19/16. 2:19 PM.  History   Chief Complaint Chief Complaint  Patient presents with  . Shortness of Breath   The history is provided by the patient and medical records. No language interpreter was used.    HPI Comments: [name] is a 71 y.o. female who presents to the Emergency Department complaining of chest congestion x 1 week. She notes associated wheezing x 2 weeks, productive cough with yellow sputum and chest pain with deep breathing. Family states pt is undergoing myeloma treatment with daratumumava at [facility] once weekly, she has had 2 treatments so far and she will have 8 more, though she could not attend treatment this past week d/t low WBC count. Pt taking multiple prescribed medications at home, and she is compliant with her medications. Pt denies fever and Hx of asthma or heart attack.  There are no other known modifying factors.  Past Medical History:  Diagnosis Date  . Anemia    takes Ferrous Fumarate 2 times week  . Arthritis    ankles  . Blood transfusion    hx of-69yr ago  . Cardiomyopathy secondary   . Cataract    bilateral and immature  . CHF (congestive heart failure) ([place]    takes Lasix prn, Echo 2015 with EF 40%  . Chronic back pain   . Diabetes mellitus    takes Glipizide and Metformin daily  . Dysrhythmia    pt takes Digoxin and Carvedilol daily  . GERD (gastroesophageal reflux disease)    takes Protonix seldom  . Gout    hx of x 1 time in Aug 2012  . H/O hiatal hernia   . Hemorrhoid   . Hypertension    takes Diovan and Spirololactone daily  . Left leg pain   . Multiple myeloma (HCC)    [facility] [place]; [facility] [place] .  Overweight(278.02)   . Peripheral neuropathy ([place]   . Pneumonia    hx of;many yrs ago  . Sleep apnea    doesn't use CPAP  . Systolic heart failure    Chronic  . Thyroid nodule    right    Patient Active Problem List   Diagnosis Date Noted  . Disease of thyroid gland 07/25/2015  . Personal history of other medical treatment 07/25/2015  . 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 Anemia associated with chemotherapy 11/26/2014  . Hematoma 10/23/2014  . Hypotension 09/17/2014  . Chronic systolic congestive heart failure ([facility] 09/17/2014  . AKI (acute kidney injury) ([place] 09/17/2014  . Diabetes mellitus with complication ([place] 075/17/0017 . HLD (hyperlipidemia) 09/17/2014  . Multiple myeloma ([place] 09/17/2014  . Chest pain 09/17/2014  . Elevated troponin   . Fall at home   . Thrombocytopenia ([place]   . Encounter for antineoplastic chemotherapy 09/04/2014  . Post menopausal syndrome 08/07/2014  . Acquired pancytopenia ([place] 07/30/2014  . 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 Arthritis, degenerative 06/16/2014  . Diabetes mellitus ([place] 06/16/2014  . Gastro-esophageal reflux disease without esophagitis 06/16/2014  . H/O deep venous thrombosis 06/16/2014  . H/O pneumothorax 06/16/2014  . Personal history of other diseases of the circulatory system 06/16/2014  . BP (high  blood pressure) 06/16/2014  . Obstructive apnea 06/16/2014  . History of appendectomy 06/16/2014  . History of cholecystectomy 06/16/2014  . History of surgical procedure 06/16/2014  . Bariatric surgery status 06/16/2014  . H/O: hysterectomy 06/16/2014  . H/O tubal ligation 06/16/2014  . 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 Chronic congestive heart failure ([place]) 05/01/2014  . 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 Abnormal presence of protein in urine 03/27/2014  . Impaired renal function 03/27/2014  . Stiffness of joint, ankle and foot 01/09/2014  . Stiffness of joint of left  pelvic region and thigh 01/09/2014  . Stiffness of joint of right pelvic region and thigh 01/09/2014  . Pain in joint, pelvic region and thigh 01/09/2014  . Muscle weakness (generalized) 01/09/2014  . Pain in joint, ankle and foot 01/09/2014  . Acute on chronic combined systolic and diastolic heart failure ([place]) 10/25/2013  . Microcytic anemia 10/25/2013  . CKD (chronic kidney disease) stage 3, GFR 30-59 ml/min 10/25/2013  . Chronic kidney disease (CKD), stage III (moderate) 10/25/2013  . Pleuritic chest pain 10/24/2013  . S/P partial thyroidectomy -Right 07/28/2011  . History of partial thyroidectomy 07/28/2011  . GASTROPARESIS 06/02/2010  . PALPITATIONS, OCCASIONAL 04/23/2010  . DIABETES MELLITUS 09/18/2008  . Morbid obesity ([place]) 09/18/2008  . Essential hypertension 09/18/2008  . CARDIOMYOPATHY, SECONDARY 09/18/2008  . SYSTOLIC HEART FAILURE, CHRONIC 09/18/2008    Past Surgical History:  Procedure Laterality Date  . ABDOMINAL HYSTERECTOMY  1991  . APPENDECTOMY  1991  . CARDIAC DEFIBRILLATOR PLACEMENT  >2yr ago  . Cardiac defirillator removed     removed <350monthremoved d/t shocking pt 15times;pierced sac of heart 1000cc ;drained  . CHOLECYSTECTOMY OPEN  1992  . COLONOSCOPY    . COLONOSCOPY N/A 02/08/2014   slf: 1. NO SOURCE FOR ANEMIA IDENTIFIED. 2. SINGLE polyp removed 3. Moderate divertculosis in the sigmoid colon and descencing colon. 4. Moderate sized internal hemorrhoids.   . Marland Kitchenefibrillator removed    . ESOPHAGOGASTRODUODENOSCOPY N/A 02/08/2014   SLF: No source for anemia identified 2. Black foreign body in cardia 3. Mild erosive gastriitis (inflammaion0 was found in the gastric antrum; multiple biopsies were performed.   . leg lipoma    . ovarian cyst removed  1991  . stomach stapling surgery  80's  . THYROID LOBECTOMY  07/27/2011   Procedure: THYROID LOBECTOMY;  Surgeon: ErGayland CurryMD,FACS;  [facility] Service: General;  Laterality: Right;  . TUBAL LIGATION   1971  . tummy tuck  1991    OB History    Gravida Para Term Preterm AB Living   _0 SAB TAB Ectopic Multiple Live Births                   Home Medications    Prior to Admission medications   Medication Sig Start Date End Date Taking? Authorizing Provider  aspirin 325 MG tablet Take 325 mg by mouth daily.    Historical Provider, MD  atorvastatin (LIPITOR) 40 MG tablet Take 40 mg by mouth.    Historical Provider, MD  carvedilol (COREG) 25 MG tablet Take 25 mg by mouth 2 (two) times daily with a meal.    Historical Provider, MD  CINNAMON PO Take 2 capsules by mouth every morning.     Historical Provider, MD  ferrous sulfate 325 (65 FE) MG tablet Take 325 mg by mouth 2 (two) times daily with a meal.  Historical Provider, MD  Flaxseed, Linseed, (FLAX SEED OIL PO) Take 1 capsule by mouth every morning.     Historical Provider, MD  furosemide (LASIX) 40 MG tablet Take 1 tablet (40 mg total) by mouth daily. For fluid retention Patient taking differently: Take 40 mg by mouth at bedtime. For fluid retention 10/27/13   Barton Dubois, MD  gabapentin (NEURONTIN) 300 MG capsule  07/24/15   Historical Provider, MD  HUMALOG KWIKPEN 100 UNIT/ML KiwkPen Take 5-12 Units by mouth 3 (three) times daily. 09/17/14   Historical Provider, MD  levothyroxine (SYNTHROID, LEVOTHROID) 100 MCG tablet Take 100 mcg by mouth daily before breakfast.    Historical Provider, MD  meloxicam (MOBIC) 15 MG tablet Take 1 tablet (15 mg total) by mouth daily. 12/19/15   Gardiner Barefoot, DPM  Multiple Vitamin (THERA) TABS Take 1 tablet by mouth. 10/30/14   Historical Provider, MD  ONE TOUCH ULTRA TEST test strip  07/02/15   Historical Provider, MD  oxyCODONE-acetaminophen (PERCOCET/ROXICET) 5-325 MG per tablet Take 1 tablet by mouth every 6 (six) hours as needed for severe pain. 10/20/14   Carmin Muskrat, MD  spironolactone (ALDACTONE) 25 MG tablet Take 12.5 mg by mouth daily as needed (fluid).     Historical Provider, MD    tetrahydrozoline (EYE DROPS) 0.05 % ophthalmic solution Place 1 drop into both eyes daily as needed. For dry eye relief    Historical Provider, MD    Family History Family History  Problem Relation Age of Onset  . Kidney failure Mother   . Heart failure Other   . Anesthesia problems Neg Hx   . Hypotension Neg Hx   . Malignant hyperthermia Neg Hx   . Pseudochol deficiency Neg Hx     Social History Social History  Substance Use Topics  . Smoking status: Never Smoker  . Smokeless tobacco: Never Used  . Alcohol use 0.0 oz/week     Comment: glass wine occasionally     Allergies   Doxycycline   Review of Systems Review of Systems  Constitutional: Negative for fever.  HENT: Positive for congestion.   Respiratory: Positive for cough, chest tightness and wheezing.   Cardiovascular: Positive for chest pain.  All other systems reviewed and are negative.    Physical Exam Updated Vital Signs Pulse (!) 114   Temp 99.5 F (37.5 C) (Oral)   Resp (!) 24   Ht _0  (1.626 m)   Wt (!) 329 lb (149.2 kg)   SpO2 98%   BMI 56.47 kg/m   Physical Exam  Constitutional: She is oriented to person, place, and time. She appears well-developed. No distress.  Morbidly obese  HENT:  Head: Normocephalic and atraumatic.  Eyes: Conjunctivae and EOM are normal. Pupils are equal, round, and reactive to light.  Neck: Normal range of motion and phonation normal. Neck supple.  Cardiovascular: Normal rate and regular rhythm.   Vascular access site, port, right upper chest wall, appears normal.  Pulmonary/Chest: No respiratory distress. She has decreased breath sounds (bilaterally). She has wheezes (expiratory). She has no rales. She exhibits no tenderness.  decreased air bilaterally, expiratory wheezes  Abdominal: Soft. She exhibits no distension. There is no tenderness. There is no guarding.  Musculoskeletal: Normal range of motion. She exhibits edema.  3+ peripheral edema legs   Neurological: She is alert and oriented to person, place, and time. She exhibits normal muscle tone.  Skin: Skin is warm and dry.  Psychiatric: She has a normal mood and affect. Her  behavior is normal. Judgment and thought content normal.  Nursing note and vitals reviewed.    ED Treatments / Results  DIAGNOSTIC STUDIES: Oxygen Saturation is 98% on RA, normal by my interpretation.    COORDINATION OF CARE: 1:12 PM Discussed treatment plan with pt at bedside and pt agreed to plan. Will review labs and reassess.  Labs (all labs ordered are listed, but only abnormal results are displayed) Labs Reviewed  CBC WITH DIFFERENTIAL/PLATELET - Abnormal; Notable for the following:       Result Value   WBC 0.3 (*)    Hemoglobin 10.0 (*)    HCT 31.9 (*)    MCH 25.3 (*)    RDW 18.7 (*)    Platelets 65 (*)    Neutro Abs 0.0 (*)    Lymphs Abs 0.2 (*)    All other components within normal limits  BASIC METABOLIC PANEL - Abnormal; Notable for the following:    Sodium 133 (*)    CO2 21 (*)    Glucose, Bld 109 (*)    BUN 23 (*)    Creatinine, Ser 1.44 (*)    Calcium 7.7 (*)    GFR calc non Af Amer 36 (*)    GFR calc Af Amer 42 (*)    All other components within normal limits  CULTURE, BLOOD (ROUTINE X 2)  CULTURE, BLOOD (ROUTINE X 2)  I-STAT CG4 LACTIC ACID, ED    EKG  EKG Interpretation  Date/Time:  Saturday June 19 2016 12:36:30 EDT Ventricular Rate:  85 PR Interval:    QRS Duration: 103 QT Interval:  393 QTC Calculation: 436 R Axis:   -15 Text Interpretation:  Sinus rhythm Multiple premature complexes, vent & supraven Inferior infarct, old Borderline ST elevation, anterior leads Baseline wander in lead(s) V1 V2 since last tracing no significant change Reconfirmed by [facility]  MD, Fauna Neuner [phone]) on 06/19/2016 2:02:56 PM       Radiology Dg Chest 2 View  Result Date: 06/19/2016 CLINICAL DATA:  SOB, Pt c/o SOB x 2 weeks, productive yellow cough, pain when breathing HISTORY OF CHF,  DM, CANCER, HTN, MULTIPLE MYELOMA, PNEUMONIA EXAM: CHEST - 2 VIEW COMPARISON:  none FINDINGS: Right IJ port catheter to the mid SVC. No pneumothorax. Coarse perihilar and bibasilar interstitial opacities. Suspect mild central vascular congestion. No pleural effusion. Moderate cardiomegaly.  Atheromatous aorta. Visualized bones unremarkable. IMPRESSION: 1. Cardiomegaly with vascular congestion and coarse perihilar interstitial opacities of uncertain chronicity. Electronically Signed   By: Lucrezia Europe M.D.   On: 06/19/2016 14:11    Procedures Procedures (including critical care time)  Medications Ordered in ED Medications  ipratropium-albuterol (DUONEB) 0.5-2.5 (3) MG/3ML nebulizer solution 3 mL (3 mLs Nebulization Given 06/19/16 1239)  albuterol (PROVENTIL) (2.5 MG/3ML) 0.083% nebulizer solution 2.5 mg (2.5 mg Nebulization Given 06/19/16 1239)  methylPREDNISolone sodium succinate (SOLU-MEDROL) 125 mg/2 mL injection 125 mg (125 mg Intravenous Given 06/19/16 1325)  sodium chloride 0.9 % bolus 500 mL (500 mLs Intravenous New Bag/Given 06/19/16 1325)     Initial Impression / Assessment and Plan / ED Course  I have reviewed the triage vital signs and the nursing notes.  Pertinent labs & imaging results that were available during my care of the patient were reviewed by me and considered in my medical decision making (see chart for details).  Clinical Course as of Jun 22 1011  Sat Jun 19, 2016  1400 Low CO2: (!) 21 [EW]  1401 Elevated Creatinine: (!) 1.44 [EW]  1401 Low  Calcium: (!) 7.7 [EW]  1401 EGFR (Non-African Amer.): (!) 36 [EW]  1401 Low EGFR (African American): (!) 42 [EW]  1401 Low Hemoglobin: (!) 10.0 [EW]  1518 The case was discussed with the oncologist on call for her oncologist, Dr. Lorin Picket Paschod.  He states that the patient can be treated empirically for respiratory infection, and followed expectantly.  He does not believe that she requires transfer there for this treatment but would be  willing to help if needed.  [EW]    Clinical Course User Index [EW] Daleen Bo, MD    Medications  ceFEPIme (MAXIPIME) 2 g in dextrose 5 % 50 mL IVPB (not administered)  ipratropium-albuterol (DUONEB) 0.5-2.5 (3) MG/3ML nebulizer solution 3 mL (3 mLs Nebulization Given 06/19/16 1239)  albuterol (PROVENTIL) (2.5 MG/3ML) 0.083% nebulizer solution 2.5 mg (2.5 mg Nebulization Given 06/19/16 1239)  methylPREDNISolone sodium succinate (SOLU-MEDROL) 125 mg/2 mL injection 125 mg (125 mg Intravenous Given 06/19/16 1325)  sodium chloride 0.9 % bolus 500 mL (0 mLs Intravenous Stopped 06/19/16 1454)    Patient Vitals for the past 24 hrs:  Temp Temp src Pulse Resp SpO2 Height Weight  06/19/16 1239 - - - - 98 % - -  06/19/16 1230 - - - - - _0  (1.626 m) (!) 329 lb (149.2 kg)  06/19/16 1229 99.5 F (37.5 C) Oral (!) 114 (!) 24 97 % - -      Final Clinical Impressions(s) / ED Diagnoses   Final diagnoses:  Neutropenia, unspecified type ([place])    Patient with respiratory symptoms, and neutropenia, after chemotherapy.  No fever currently.  Initial lactate normal.  Empiric antibiotic started for respiratory infection, patient will require admission for monitoring management.  Call placed to patient's oncology service.  Nursing Notes Reviewed/ Care Coordinated Applicable Imaging Reviewed Interpretation of Laboratory Data incorporated into ED treatment  Plan: Admit  New Prescriptions New Prescriptions   No medications on file   I personally performed the services described in this documentation, which was scribed in my presence. The recorded information has been reviewed and is accurate.     Daleen Bo, MD 06/21/16 [id number]

## 2016-06-19 NOTE — H&P (Signed)
History and Physical  [name] JOI:[id number] DOB: 09-Jul-1945 DOA: 06/19/2016  Referring physician: Dr Eulis Foster, ED physician PCP: Maggie Font, MD  Outpatient Specialists:   [facility] (GI)  [facility] (Onc)  [facility] (Card)  Patient Coming From: Home  Chief Complaint: SOB  HPI: [name] is a 71 y.o. female with a history of multiple myeloma currently undergoing treatment with Xgeva and Darzalex weekly, systolic heart failure with an EF of 40% on echocardiogram in 2016, thrombus cytopenia, neutropenia, GERD, morbid obesity, hypertension, diabetes type 2 on insulin, hypothyroidism. Patient seen in the emergency apartment for a week of worsening shortness of breath is worse with exertion and improved with rest. On thursday, she was diagnosed with neutropenia and started on Bactrim and given neutropenic precautions. Today she was brought in because of audible wheezing was heard across the house. She was extremely short of breath headaches cream difficult time ambulating. She has been quite congested and does have cough with clear, thick sputum production, which has been worsening over the past several days.  Emergency Department Course: Patient was given 1 dose of Solu-Medrol 125 mg IV in route to the hospital. The patient was given nebulizer treatments and given cefepime. The ER provider talked with the oncologist on call with no blunt, who recommended admission and IV antibiotics.  Review of Systems:   Pt denies any fevers, chills, nausea, vomiting, diarrhea, constipation, abdominal pain, orthopnea, wheezing, palpitations, headache, vision changes, lightheadedness, dizziness, melena, rectal bleeding.  Review of systems are otherwise negative  Past Medical History:  Diagnosis Date  . Anemia    takes Ferrous Fumarate 2 times week  . Arthritis    ankles  . Blood transfusion    hx of-73yr ago  . Cardiomyopathy secondary   . Cataract    bilateral and immature  . CHF  (congestive heart failure) ([place]    takes Lasix prn, Echo 2015 with EF 40%  . Chronic back pain   . Diabetes mellitus    takes Glipizide and Metformin daily  . Dysrhythmia    pt takes Digoxin and Carvedilol daily  . GERD (gastroesophageal reflux disease)    takes Protonix seldom  . Gout    hx of x 1 time in Aug 2012  . H/O hiatal hernia   . Hemorrhoid   . Hypertension    takes Diovan and Spirololactone daily  . Left leg pain   . Multiple myeloma (HCC)    [facility] [facility] . Overweight(278.02)   . Peripheral neuropathy ([place]   . Pneumonia    hx of;many yrs ago  . Sleep apnea    doesn't use CPAP  . Systolic heart failure    Chronic  . Thyroid nodule    right   Past Surgical History:  Procedure Laterality Date  . ABDOMINAL HYSTERECTOMY  1991  . APPENDECTOMY  1991  . CARDIAC DEFIBRILLATOR PLACEMENT  >396yrago  . Cardiac defirillator removed     removed <44m55monthemoved d/t shocking pt 15times;pierced sac of heart 1000cc ;drained  . CHOLECYSTECTOMY OPEN  1992  . COLONOSCOPY    . COLONOSCOPY N/A 02/08/2014   slf: 1. NO SOURCE FOR ANEMIA IDENTIFIED. 2. SINGLE polyp removed 3. Moderate divertculosis in the sigmoid colon and descencing colon. 4. Moderate sized internal hemorrhoids.   . dMarland Kitchenfibrillator removed    . ESOPHAGOGASTRODUODENOSCOPY N/A 02/08/2014   SLF: No source for anemia identified 2. Black foreign body in cardia 3. Mild erosive gastriitis (  inflammaion0 was found in the gastric antrum; multiple biopsies were performed.   . leg lipoma    . ovarian cyst removed  1991  . stomach stapling surgery  80's  . THYROID LOBECTOMY  07/27/2011   Procedure: THYROID LOBECTOMY;  Surgeon: Gayland Curry, MD,FACS;  Location: [place];  Service: General;  Laterality: Right;  . TUBAL LIGATION  1971  . tummy tuck  1991   Social History:  reports that she has never smoked. She has never used smokeless tobacco. She reports that she drinks alcohol. She reports  that she does not use drugs. Patient lives at [facility]  . Doxycycline Rash    Family History  Problem Relation Age of Onset  . Kidney failure Mother   . Heart failure Other   . Anesthesia problems Neg Hx   . Hypotension Neg Hx   . Malignant hyperthermia Neg Hx   . Pseudochol deficiency Neg Hx       Prior to Admission medications   Medication Sig Start Date End Date Taking? Authorizing Provider  aspirin 325 MG tablet Take 325 mg by mouth daily.    Historical Provider, MD  atorvastatin (LIPITOR) 40 MG tablet Take 40 mg by mouth.    Historical Provider, MD  carvedilol (COREG) 25 MG tablet Take 25 mg by mouth 2 (two) times daily with a meal.    Historical Provider, MD  CINNAMON PO Take 2 capsules by mouth every morning.     Historical Provider, MD  ferrous sulfate 325 (65 FE) MG tablet Take 325 mg by mouth 2 (two) times daily with a meal.    Historical Provider, MD  Flaxseed, Linseed, (FLAX SEED OIL PO) Take 1 capsule by mouth every morning.     Historical Provider, MD  furosemide (LASIX) 40 MG tablet Take 1 tablet (40 mg total) by mouth daily. For fluid retention Patient taking differently: Take 40 mg by mouth at bedtime. For fluid retention 10/27/13   Barton Dubois, MD  gabapentin (NEURONTIN) 300 MG capsule  07/24/15   Historical Provider, MD  HUMALOG KWIKPEN 100 UNIT/ML KiwkPen Take 5-12 Units by mouth 3 (three) times daily. 09/17/14   Historical Provider, MD  levothyroxine (SYNTHROID, LEVOTHROID) 100 MCG tablet Take 100 mcg by mouth daily before breakfast.    Historical Provider, MD  meloxicam (MOBIC) 15 MG tablet Take 1 tablet (15 mg total) by mouth daily. 12/19/15   Gardiner Barefoot, DPM  Multiple Vitamin (THERA) TABS Take 1 tablet by mouth. 10/30/14   Historical Provider, MD  ONE TOUCH ULTRA TEST test strip  07/02/15   Historical Provider, MD  oxyCODONE-acetaminophen (PERCOCET/ROXICET) 5-325 MG per tablet Take 1 tablet by mouth every 6 (six) hours as needed for  severe pain. 10/20/14   Carmin Muskrat, MD  spironolactone (ALDACTONE) 25 MG tablet Take 12.5 mg by mouth daily as needed (fluid).     Historical Provider, MD  tetrahydrozoline (EYE DROPS) 0.05 % ophthalmic solution Place 1 drop into both eyes daily as needed. For dry eye relief    Historical Provider, MD    Physical Exam: Pulse (!) 114   Temp 99.5 F (37.5 C) (Oral)   Resp (!) 24   Ht _0  (1.626 m)   Wt (!) 149.2 kg (329 lb)   SpO2 98%   BMI 56.47 kg/m   General: Elderly black female. Awake and alert and oriented x3. No acute cardiopulmonary distress.  HEENT: Normocephalic atraumatic.  Right and left ears normal in  appearance.  Pupils equal, round, reactive to light. Extraocular muscles are intact. Sclerae anicteric and noninjected.  Moist mucosal membranes. No mucosal lesions.  Neck: Neck supple without lymphadenopathy. No carotid bruits. No masses palpated.  Cardiovascular: Regular rate with normal S1-S2 sounds. No murmurs, rubs, gallops auscultated. No JVD.  Respiratory: Coarse breath sounds throughout with expiratory wheezing in the upper airways. There is prolonged exhalation phase. Possible rales in the bases, although this is difficult to perceive due to all the rhonchi.. Abdomen: Morbidly obese Soft, nontender, nondistended. Active bowel sounds. No masses or hepatosplenomegaly, although body habitus prohibits adequate exam Skin: No rashes, lesions, or ulcerations.  Dry, warm to touch. 2+ dorsalis pedis and radial pulses. Musculoskeletal: No calf or leg pain. All major joints not erythematous nontender.  No upper or lower joint deformation.  Good ROM.  No contractures  Psychiatric: Intact judgment and insight. Pleasant and cooperative. Neurologic: No focal neurological deficits. Strength is 5/5 and symmetric in upper and lower extremities.  Cranial nerves II through XII are grossly intact.           Labs on Admission: I have personally reviewed following labs and imaging  studies  CBC:  Recent Labs Lab 06/19/16 1322  WBC 0.3*  NEUTROABS 0.0*  HGB 10.0*  HCT 31.9*  MCV 80.8  PLT 65*   Basic Metabolic Panel:  Recent Labs Lab 06/19/16 1322  NA 133*  K 4.4  CL 103  CO2 21*  GLUCOSE 109*  BUN 23*  CREATININE 1.44*  CALCIUM 7.7*   GFR: Estimated Creatinine Clearance: 53.8 mL/min (A) (by C-G formula based on SCr of 1.44 mg/dL (H)). Liver Function Tests: No results for input(s): AST, ALT, ALKPHOS, BILITOT, PROT, ALBUMIN in the last 168 hours. No results for input(s): LIPASE, AMYLASE in the last 168 hours. No results for input(s): AMMONIA in the last 168 hours. Coagulation Profile: No results for input(s): INR, PROTIME in the last 168 hours. Cardiac Enzymes: No results for input(s): CKTOTAL, CKMB, CKMBINDEX, TROPONINI in the last 168 hours. BNP (last 3 results) No results for input(s): PROBNP in the last 8760 hours. HbA1C: No results for input(s): HGBA1C in the last 72 hours. CBG: No results for input(s): GLUCAP in the last 168 hours. Lipid Profile: No results for input(s): CHOL, HDL, LDLCALC, TRIG, CHOLHDL, LDLDIRECT in the last 72 hours. Thyroid Function Tests: No results for input(s): TSH, T4TOTAL, FREET4, T3FREE, THYROIDAB in the last 72 hours. Anemia Panel: No results for input(s): VITAMINB12, FOLATE, FERRITIN, TIBC, IRON, RETICCTPCT in the last 72 hours. Urine analysis:    Component Value Date/Time   COLORURINE YELLOW 09/17/2014 1730   APPEARANCEUR CLEAR 09/17/2014 1730   LABSPEC 1.025 09/17/2014 1730   PHURINE 5.5 09/17/2014 1730   GLUCOSEU NEGATIVE 09/17/2014 1730   HGBUR NEGATIVE 09/17/2014 1730   BILIRUBINUR NEGATIVE 09/17/2014 1730   KETONESUR NEGATIVE 09/17/2014 1730   PROTEINUR NEGATIVE 09/17/2014 1730   UROBILINOGEN 0.2 09/17/2014 1730   NITRITE NEGATIVE 09/17/2014 1730   LEUKOCYTESUR NEGATIVE 09/17/2014 1730   Sepsis Labs: _0 (procalcitonin:4,lacticidven:4) ) Recent Results (from the past 240 hour(s))    Culture, blood (routine x 2)     Status: None (Preliminary result)   Collection Time: 06/19/16  1:57 PM  Result Value Ref Range Status   Specimen Description BLOOD LEFT HAND  Final   Special Requests BOTTLES DRAWN AEROBIC AND ANAEROBIC 4CC EACH  Final   Culture PENDING  Incomplete   Report Status PENDING  Incomplete     Radiological Exams on  Admission: Dg Chest 2 View  Result Date: 06/19/2016 CLINICAL DATA:  SOB, Pt c/o SOB x 2 weeks, productive yellow cough, pain when breathing HISTORY OF CHF, DM, CANCER, HTN, MULTIPLE MYELOMA, PNEUMONIA EXAM: CHEST - 2 VIEW COMPARISON:  none FINDINGS: Right IJ port catheter to the mid SVC. No pneumothorax. Coarse perihilar and bibasilar interstitial opacities. Suspect mild central vascular congestion. No pleural effusion. Moderate cardiomegaly.  Atheromatous aorta. Visualized bones unremarkable. IMPRESSION: 1. Cardiomegaly with vascular congestion and coarse perihilar interstitial opacities of uncertain chronicity. Electronically Signed   By: Lucrezia Europe M.D.   On: 06/19/2016 14:11    EKG: Independently reviewed. 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 No acute ST changes  Assessment/Plan: Active Problems:   Essential hypertension   CKD (chronic kidney disease) stage 3, GFR 30-59 ml/min   Chronic systolic congestive heart failure (HCC)   Diabetes mellitus with complication (HCC)   Multiple myeloma (HCC)   Chronic kidney disease (CKD), stage III (moderate)   Acute bronchitis   SOB (shortness of breath)   Neutropenia ([place])    This patient was discussed with the ED physician, including pertinent vitals, physical exam findings, labs, and imaging.  We also discussed care given by the ED provider.  #1 acute bronchitis  Admit to MedSurg  Continue cefepime, will add azithromycin  Blood cultures obtained  Obtain sputum culture  We'll obtain pro calcitonin to rule out infection  Strep antigen by urine  Continue steroids: Solu-Medrol  60 mg IV every 6 hours  Albuterol nebs every 4 hours and every 2 when necessary  Spiriva  Mucinex  Repeat CBC tomorrow #2 shortness of breath  X-ray does show cardiomegaly with vascular congestion  Obtain BNP to rule out acute CHF addition to her bronchitis  Continue Lasix and spironolactone for now  Repeat BMP tomorrow #3 neutropenia  Neutropenic precautions  CBC tomorrow #4 essential hypertension  Continue antihypertensives #5 multiple myeloma  We'll hold treatment for now #6 diabetes type 2  Start long-acting insulin   CBGs before meals and daily at bedtime  Sliding scale insulin #7 chronic systolic heart failure  Continue Lasix and spironolactone #8 chronic kidney disease  Renally dose medications  DVT prophylaxis: SCDs as patient is thrombocytopenic Consultants: None Code Status: Full code Family Communication: Family in the room  Disposition Plan: Patient should be able to return home following admission   Truett Mainland, DO [facility] Pager [phone]  If 7PM-7AM, please contact night-coverage [email or web address] Password [email or web address]

## 2016-06-19 NOTE — ED Triage Notes (Signed)
Respiratory notified of pt's symptoms. Pt taken directly to ED room. EDP in hallway as pt being brought to ED room and EDP visualized pt.

## 2016-06-19 NOTE — ED Triage Notes (Signed)
Pt c/o SOB x 2 weeks, productive yellow cough, pain when breathing. Pt wheezing in triage.

## 2016-06-19 NOTE — ED Notes (Signed)
Report given to Cindy RN.

## 2016-06-19 NOTE — ED Notes (Signed)
  CRITICAL VALUE ALERT  Critical value received:  WBC 0.3  Date of notification:  06/19/2016  Time of notification:  1418  Critical value read back:  Yes  Nurse who received alert:  Cena Benton RN  MD notified (1st page):  Eulis Foster  Time of first page:  1418  Responding MD:  Eulis Foster  Time MD responded:  [phone]

## 2016-06-19 NOTE — ED Notes (Signed)
Neutropenic precautions taken at this time and signage hung on door.

## 2016-06-20 ENCOUNTER — Inpatient Hospital Stay (HOSPITAL_COMMUNITY): Payer: Medicare Other

## 2016-06-20 DIAGNOSIS — I509 Heart failure, unspecified: Secondary | ICD-10-CM

## 2016-06-20 LAB — ECHOCARDIOGRAM COMPLETE
HEIGHTINCHES: 64 in
WEIGHTICAEL: 5262.82 [oz_av]

## 2016-06-20 LAB — EXPECTORATED SPUTUM ASSESSMENT W GRAM STAIN, RFLX TO RESP C

## 2016-06-20 LAB — CBC
HCT: 28 % — ABNORMAL LOW (ref 36.0–46.0)
HEMOGLOBIN: 9.1 g/dL — AB (ref 12.0–15.0)
MCH: 25.9 pg — ABNORMAL LOW (ref 26.0–34.0)
MCHC: 32.5 g/dL (ref 30.0–36.0)
MCV: 79.5 fL (ref 78.0–100.0)
Platelets: 74 10*3/uL — ABNORMAL LOW (ref 150–400)
RBC: 3.52 MIL/uL — AB (ref 3.87–5.11)
RDW: 19 % — ABNORMAL HIGH (ref 11.5–15.5)
WBC: 0.3 10*3/uL — CL (ref 4.0–10.5)

## 2016-06-20 LAB — BASIC METABOLIC PANEL
Anion gap: 10 (ref 5–15)
BUN: 30 mg/dL — ABNORMAL HIGH (ref 6–20)
CALCIUM: 7.3 mg/dL — AB (ref 8.9–10.3)
CO2: 18 mmol/L — AB (ref 22–32)
Chloride: 105 mmol/L (ref 101–111)
Creatinine, Ser: 1.46 mg/dL — ABNORMAL HIGH (ref 0.44–1.00)
GFR, EST AFRICAN AMERICAN: 41 mL/min — AB (ref >60)
GFR, EST NON AFRICAN AMERICAN: 36 mL/min — AB (ref >60)
Glucose, Bld: 254 mg/dL — ABNORMAL HIGH (ref 65–99)
POTASSIUM: 4.5 mmol/L (ref 3.5–5.1)
Sodium: 133 mmol/L — ABNORMAL LOW (ref 135–145)

## 2016-06-20 LAB — GLUCOSE, CAPILLARY
GLUCOSE-CAPILLARY: 226 mg/dL — AB (ref 65–99)
GLUCOSE-CAPILLARY: 322 mg/dL — AB (ref 65–99)
Glucose-Capillary: 227 mg/dL — ABNORMAL HIGH (ref 65–99)
Glucose-Capillary: 261 mg/dL — ABNORMAL HIGH (ref 65–99)

## 2016-06-20 LAB — EXPECTORATED SPUTUM ASSESSMENT W REFEX TO RESP CULTURE

## 2016-06-20 MED ORDER — IPRATROPIUM BROMIDE 0.02 % IN SOLN: 0.5000 mg | Freq: Four times a day (QID) | RESPIRATORY_TRACT | Status: DC

## 2016-06-20 MED ORDER — INSULIN ASPART 100 UNIT/ML ~~LOC~~ SOLN
5.0000 [IU] | Freq: Once | SUBCUTANEOUS | Status: AC
  Administered 2016-06-20: 5 [IU] via SUBCUTANEOUS

## 2016-06-20 MED ORDER — TRAMADOL HCL 50 MG PO TABS: 100.0000 mg | ORAL_TABLET | Freq: Four times a day (QID) | ORAL | Status: DC | PRN

## 2016-06-20 MED ORDER — FUROSEMIDE 10 MG/ML IJ SOLN
40.0000 mg | Freq: Two times a day (BID) | INTRAMUSCULAR | Status: DC
  Administered 2016-06-20 – 2016-06-22 (×4): 40 mg via INTRAVENOUS
  Filled 2016-06-20 (×4): qty 4

## 2016-06-20 MED ORDER — IPRATROPIUM-ALBUTEROL 0.5-2.5 (3) MG/3ML IN SOLN
3.0000 mL | Freq: Four times a day (QID) | RESPIRATORY_TRACT | Status: DC
  Administered 2016-06-20 – 2016-06-22 (×7): 3 mL via RESPIRATORY_TRACT
  Filled 2016-06-20 (×7): qty 3

## 2016-06-20 NOTE — Progress Notes (Signed)
Sputum collected by rt

## 2016-06-20 NOTE — Progress Notes (Signed)
Pharmacy Antibiotic Note  [name] is a 71 y.o. female admitted on 06/19/2016 with SOB.  Pharmacy has been consulted for cefepime dosing.  Plan: Cefepime 2 gm IV q8 hours f/u renal function, cultures and clinical course  Height: 5\' 4"  (162.6 cm) Weight: (!) 328 lb 14.8 oz (149.2 kg) IBW/kg (Calculated) : 54.7  Temp (24hrs), Avg:98.5 F (36.9 C), Min:97.4 F (36.3 C), Max:99.5 F (37.5 C)   Recent Labs Lab 06/19/16 1322 06/19/16 1333 06/20/16 0616  WBC 0.3*  --  0.3*  CREATININE 1.44*  --  1.46*  LATICACIDVEN  --  1.08  --     Estimated Creatinine Clearance: 53.1 mL/min (A) (by C-G formula based on SCr of 1.46 mg/dL (H)).    Allergies  Allergen Reactions  . Doxycycline Rash    Antimicrobials this admission: cefepime 3/31 >>  azithromycin 3/31 >>   Thank you for allowing pharmacy to be a part of this patient's care.  Beverlee Nims 06/20/2016 8:38 AM

## 2016-06-20 NOTE — Progress Notes (Addendum)
PROGRESS NOTE    [name]  XAJ:[id number] DOB: 10/05/1945 DOA: 06/19/2016 PCP: Maggie Font, MD    Brief Narrative: [name] is a 71 y.o. female with a history of multiple myeloma currently undergoing treatment with Xgeva and Darzalex weekly, systolic heart failure with an EF of 40% on echocardiogram in 2016, thrombocytopenia, neutropenia, GERD, morbid obesity, hypertension, diabetes type 2 on insulin, hypothyroidism,  Multiple myeloma, presents with a week history of sob, was found to be diffusely wheezing and productive cough. She was admitted for further evaluation.   Assessment & Plan:   Active Problems:   Essential hypertension   CKD (chronic kidney disease) stage 3, GFR 30-59 ml/min   Chronic systolic congestive heart failure (HCC)   Diabetes mellitus with complication (HCC)   Multiple myeloma (HCC)   Chronic kidney disease (CKD), stage III (moderate)   Acute bronchitis   SOB (shortness of breath)   Neutropenia (HCC)  Acute respiratory failure secondary to possible acute on chronic bronchitis and acute on chronic systolic heart failure: Admitted to telemetry, and started her on IV solumedrol, bronchodilators and cough medication. Blood cultures and sputum cultures done and pending.  CXR does not show any definite pneumonia but has interstitial fluid.  Started her on IV lasix 40 mg IV Q12h, DAILY weights and strict in take and output.  Echocardiogram shows worsening LVEF to 30 % from 40% from 2016.  Resume coreg.    Diabetes mellitus:  CBG (last 3)   Recent Labs  06/19/16 2038 06/20/16 0745 06/20/16 1126  GLUCAP 309* 227* 261*    hgba1c will be ordered.  Elevated cbg's from solumedrol.    Hypothyroidism: resume synthroid.   MUltiple Myeloma:  Her Pomalyst on hold for now. Will discuss with her oncologist in am.    Neutropenia:  Her ANC is 0.  Monitor for now. Blood cultures done and pending.    Pancytopenia: suspect from the MM.    Monitor for now.   Stage 3 CKD: Creatinine at baselin.    DVT prophylaxis: scd's Code Status: full code.  Family Communication: family at bedside.  Disposition Plan: pending further eval.    Consultants:   None.    Procedures: echocardiogram.    Antimicrobials: zithromax and cefepime.    Subjective: Wheezing. Some sob.   Objective: Vitals:   06/20/16 0546 06/20/16 0754 06/20/16 1405 06/20/16 1504  BP: 112/72  (!) 116/44   Pulse: (!) 56  (!) 54   Resp: 20  20   Temp: 97.4 F (36.3 C)  97.7 F (36.5 C)   TempSrc: Oral  Oral   SpO2: 99% 97% 99% 97%  Weight:      Height:        Intake/Output Summary (Last 24 hours) at 06/20/16 1522 Last data filed at 06/20/16 1409  Gross per 24 hour  Intake              590 ml  Output             2600 ml  Net            -2010 ml   Filed Weights   06/19/16 1230 06/19/16 1708  Weight: (!) 149.2 kg (329 lb) (!) 149.2 kg (328 lb 14.8 oz)    Examination:  General exam: Appears calm and comfortable  Respiratory system: bilateral exp wheezing and crackles at bases.  Cardiovascular system: S1 & S2 heard, RRR. No JVD, murmurs, rubs, gallops or clicks. No pedal edema. Gastrointestinal  system: Abdomen is nondistended, soft and nontender. No organomegaly or masses felt. Normal bowel sounds heard. Central nervous system: Alert and oriented. No focal neurological deficits. Extremities: Symmetric 5 x 5 power. Skin: No rashes, lesions or ulcers Psychiatry: Judgement and insight appear normal. Mood & affect appropriate.     Data Reviewed: I have personally reviewed following labs and imaging studies  CBC:  Recent Labs Lab 06/19/16 1322 06/20/16 0616  WBC 0.3* 0.3*  NEUTROABS 0.0*  --   HGB 10.0* 9.1*  HCT 31.9* 28.0*  MCV 80.8 79.5  PLT 65* 74*   Basic Metabolic Panel:  Recent Labs Lab 06/19/16 1322 06/20/16 0616  NA 133* 133*  K 4.4 4.5  CL 103 105  CO2 21* 18*  GLUCOSE 109* 254*  BUN 23* 30*  CREATININE 1.44*  1.46*  CALCIUM 7.7* 7.3*   GFR: Estimated Creatinine Clearance: 53.1 mL/min (A) (by C-G formula based on SCr of 1.46 mg/dL (H)). Liver Function Tests: No results for input(s): AST, ALT, ALKPHOS, BILITOT, PROT, ALBUMIN in the last 168 hours. No results for input(s): LIPASE, AMYLASE in the last 168 hours. No results for input(s): AMMONIA in the last 168 hours. Coagulation Profile: No results for input(s): INR, PROTIME in the last 168 hours. Cardiac Enzymes: No results for input(s): CKTOTAL, CKMB, CKMBINDEX, TROPONINI in the last 168 hours. BNP (last 3 results) No results for input(s): PROBNP in the last 8760 hours. HbA1C: No results for input(s): HGBA1C in the last 72 hours. CBG:  Recent Labs Lab 06/19/16 1821 06/19/16 2038 06/20/16 0745 06/20/16 1126  GLUCAP 226* 309* 227* 261*   Lipid Profile: No results for input(s): CHOL, HDL, LDLCALC, TRIG, CHOLHDL, LDLDIRECT in the last 72 hours. Thyroid Function Tests: No results for input(s): TSH, T4TOTAL, FREET4, T3FREE, THYROIDAB in the last 72 hours. Anemia Panel: No results for input(s): VITAMINB12, FOLATE, FERRITIN, TIBC, IRON, RETICCTPCT in the last 72 hours. Sepsis Labs:  Recent Labs Lab 06/19/16 1322 06/19/16 1333  PROCALCITON 0.19  --   LATICACIDVEN  --  1.08    Recent Results (from the past 240 hour(s))  Culture, blood (routine x 2)     Status: None (Preliminary result)   Collection Time: 06/19/16  1:57 PM  Result Value Ref Range Status   Specimen Description BLOOD LEFT HAND  Final   Special Requests BOTTLES DRAWN AEROBIC AND ANAEROBIC 4CC EACH  Final   Culture NO GROWTH < 24 HOURS  Final   Report Status PENDING  Incomplete  Culture, sputum-assessment     Status: None   Collection Time: 06/19/16  5:15 PM  Result Value Ref Range Status   Specimen Description SPUTUM  Final   Special Requests NONE  Final   Sputum evaluation   Final    Sputum specimen not acceptable for testing.  Please recollect.   SPOKE WITH  [facility],L @ [phone] ON 4.1.18 TO PLEASE RECOLLECT BY [facility]AY[facility],L   Report Status 06/20/2016 FINAL  Final  Culture, blood (routine x 2)     Status: None (Preliminary result)   Collection Time: 06/19/16  5:56 PM  Result Value Ref Range Status   Specimen Description BLOOD RIGHT HAND  Final   Special Requests BOTTLES DRAWN AEROBIC AND ANAEROBIC 6CC EACH  Final   Culture NO GROWTH < 24 HOURS  Final   Report Status PENDING  Incomplete  MRSA PCR Screening     Status: None   Collection Time: 06/19/16  6:17 PM  Result Value Ref Range Status   MRSA  by PCR NEGATIVE NEGATIVE Final    Comment:        The GeneXpert MRSA Assay (FDA approved for NASAL specimens only), is one component of a comprehensive MRSA colonization surveillance program. It is not intended to diagnose MRSA infection nor to guide or monitor treatment for MRSA infections.          Radiology Studies: Dg Chest 2 View  Result Date: 06/19/2016 CLINICAL DATA:  SOB, Pt c/o SOB x 2 weeks, productive yellow cough, pain when breathing HISTORY OF CHF, DM, CANCER, HTN, MULTIPLE MYELOMA, PNEUMONIA EXAM: CHEST - 2 VIEW COMPARISON:  none FINDINGS: Right IJ port catheter to the mid SVC. No pneumothorax. Coarse perihilar and bibasilar interstitial opacities. Suspect mild central vascular congestion. No pleural effusion. Moderate cardiomegaly.  Atheromatous aorta. Visualized bones unremarkable. IMPRESSION: 1. Cardiomegaly with vascular congestion and coarse perihilar interstitial opacities of uncertain chronicity. Electronically Signed   By: Lucrezia Europe M.D.   On: 06/19/2016 14:11        Scheduled Meds: . albuterol  2.5 mg Nebulization Q4H  . atorvastatin  40 mg Oral q1800  . azithromycin  500 mg Intravenous Q24H  . carvedilol  25 mg Oral BID WC  . ceFEPime (MAXIPIME) IV  2 g Intravenous Q8H  . ferrous sulfate  325 mg Oral BID WC  . furosemide  40 mg Intravenous Q12H  . insulin aspart  0-20 Units Subcutaneous TID WC  . insulin aspart  10  Units Subcutaneous TID WC  . insulin glargine  10 Units Subcutaneous QHS  . levothyroxine  100 mcg Oral QAC breakfast  . meloxicam  15 mg Oral Daily  . methylPREDNISolone (SOLU-MEDROL) injection  60 mg Intravenous Q6H  . tiotropium  18 mcg Inhalation Daily   Continuous Infusions:   LOS: 1 day    Time spent: 35 MINUTES.     Hosie Poisson, MD [facility] Pager [phone]  If 7PM-7AM, please contact night-coverage [email or web address] Password [email or web address] 06/20/2016, 3:22 PM

## 2016-06-20 NOTE — Progress Notes (Signed)
Echo done.

## 2016-06-21 LAB — CBC WITH DIFFERENTIAL/PLATELET
BASOS ABS: 0 10*3/uL (ref 0.0–0.1)
Basophils Relative: 0 %
EOS PCT: 0 %
Eosinophils Absolute: 0 10*3/uL (ref 0.0–0.7)
HEMATOCRIT: 30.5 % — AB (ref 36.0–46.0)
HEMOGLOBIN: 9.9 g/dL — AB (ref 12.0–15.0)
LYMPHS PCT: 41 %
Lymphs Abs: 0.2 10*3/uL — ABNORMAL LOW (ref 0.7–4.0)
MCH: 25.4 pg — ABNORMAL LOW (ref 26.0–34.0)
MCHC: 32.5 g/dL (ref 30.0–36.0)
MCV: 78.4 fL (ref 78.0–100.0)
Monocytes Absolute: 0.1 10*3/uL (ref 0.1–1.0)
Monocytes Relative: 18 %
NEUTROS ABS: 0.1 10*3/uL — AB (ref 1.7–7.7)
Neutrophils Relative %: 41 %
Platelets: 99 10*3/uL — ABNORMAL LOW (ref 150–400)
RBC: 3.89 MIL/uL (ref 3.87–5.11)
RDW: 18.9 % — ABNORMAL HIGH (ref 11.5–15.5)
WBC: 0.4 10*3/uL — CL (ref 4.0–10.5)

## 2016-06-21 LAB — BASIC METABOLIC PANEL
ANION GAP: 11 (ref 5–15)
BUN: 34 mg/dL — ABNORMAL HIGH (ref 6–20)
CHLORIDE: 102 mmol/L (ref 101–111)
CO2: 21 mmol/L — AB (ref 22–32)
CREATININE: 1.52 mg/dL — AB (ref 0.44–1.00)
Calcium: 7.6 mg/dL — ABNORMAL LOW (ref 8.9–10.3)
GFR calc non Af Amer: 34 mL/min — ABNORMAL LOW (ref >60)
GFR, EST AFRICAN AMERICAN: 39 mL/min — AB (ref >60)
Glucose, Bld: 203 mg/dL — ABNORMAL HIGH (ref 65–99)
Potassium: 4.5 mmol/L (ref 3.5–5.1)
Sodium: 134 mmol/L — ABNORMAL LOW (ref 135–145)

## 2016-06-21 LAB — GLUCOSE, CAPILLARY
GLUCOSE-CAPILLARY: 242 mg/dL — AB (ref 65–99)
Glucose-Capillary: 222 mg/dL — ABNORMAL HIGH (ref 65–99)
Glucose-Capillary: 278 mg/dL — ABNORMAL HIGH (ref 65–99)
Glucose-Capillary: 261 mg/dL — ABNORMAL HIGH (ref 65–99)

## 2016-06-21 LAB — PROCALCITONIN: PROCALCITONIN: 0.13 ng/mL

## 2016-06-21 LAB — HIV ANTIBODY (ROUTINE TESTING W REFLEX): HIV Screen 4th Generation wRfx: NONREACTIVE

## 2016-06-21 MED ORDER — SENNOSIDES-DOCUSATE SODIUM 8.6-50 MG PO TABS
2.0000 | ORAL_TABLET | Freq: Two times a day (BID) | ORAL | Status: DC
  Administered 2016-06-21 – 2016-06-24 (×7): 2 via ORAL
  Filled 2016-06-21 (×8): qty 2

## 2016-06-21 MED ORDER — INSULIN GLARGINE 100 UNIT/ML ~~LOC~~ SOLN
15.0000 [IU] | Freq: Every day | SUBCUTANEOUS | Status: DC
  Administered 2016-06-21 – 2016-06-23 (×3): 15 [IU] via SUBCUTANEOUS
  Filled 2016-06-21 (×4): qty 0.15

## 2016-06-21 MED ORDER — METHYLPREDNISOLONE SODIUM SUCC 40 MG IJ SOLR
40.0000 mg | Freq: Four times a day (QID) | INTRAMUSCULAR | Status: DC
  Administered 2016-06-21 – 2016-06-22 (×3): 40 mg via INTRAVENOUS
  Filled 2016-06-21 (×4): qty 1

## 2016-06-21 MED ORDER — POLYETHYLENE GLYCOL 3350 17 G PO PACK
17.0000 g | PACK | Freq: Every day | ORAL | Status: DC
  Administered 2016-06-21 – 2016-06-24 (×4): 17 g via ORAL
  Filled 2016-06-21 (×4): qty 1

## 2016-06-21 NOTE — Progress Notes (Signed)
PROGRESS NOTE    [name]  EQA:[id number] DOB: [birth year] DOA: 06/19/2016 PCP: Maggie Font, MD    Brief Narrative: [name] is a 71 y.o. female with a history of multiple myeloma currently undergoing treatment with Xgeva and Darzalex weekly, systolic heart failure with an EF of 40% on echocardiogram in 2016, thrombocytopenia, neutropenia, GERD, morbid obesity, hypertension, diabetes type 2 on insulin, hypothyroidism,  Multiple myeloma, presents with a week history of sob, was found to be diffusely wheezing and productive cough. She was admitted for further evaluation.   Assessment & Plan:   Active Problems:   Essential hypertension   CKD (chronic kidney disease) stage 3, GFR 30-59 ml/min   Chronic systolic congestive heart failure (HCC)   Diabetes mellitus with complication (HCC)   Multiple myeloma (HCC)   Chronic kidney disease (CKD), stage III (moderate)   Acute bronchitis   SOB (shortness of breath)   Neutropenia (HCC)  Acute respiratory failure secondary to possible acute on chronic bronchitis and acute on chronic systolic heart failure: Admitted to telemetry, and started her on IV solumedrol, bronchodilators and cough medication. Blood cultures and sputum cultures done and pending. They have been negative so far. Taper down the solumedrol from 60 to 40 mg every 6 hours. Her breathing and wheezing has improved.  CXR does not show any definite pneumonia but has interstitial fluid.  Started her on IV lasix 40 mg IV Q12h, DAILY weights and strict in take and output. Her creatinine bumped slightly, up , so we will decrease the dose of lasix to daily from q12 from tomorrow.    She has diuresed about 4 liters yesterday and upto 6.2 liters since admission.  Echocardiogram shows worsening LVEF to 30 % from 40% from 2016. Cardiology consulted for recommendations.  Resume coreg.  Potassium wnl.    Diabetes mellitus:  CBG (last 3)   Recent Labs  06/20/16 2046  06/21/16 0750 06/21/16 1133  GLUCAP 322* 222* 278*    hgba1c will be ordered.  Elevated cbg's from solumedrol.  Increase lantus from 10 to 15 units.    Hypothyroidism: resume synthroid.   MUltiple Myeloma:  Her Pomalyst on hold for now. Called Dr Tressie Stalker to notify of the patient's admission.    Neutropenia:  Her ANC is improved to 100.  Monitor for now. Blood cultures done and pending.    Pancytopenia: suspect from the MM.  Monitor for now.   Stage 3 CKD: Creatinine at baseline.    DVT prophylaxis: scd's Code Status: full code.  Family Communication: family at bedside.  Disposition Plan: pending further eval.    Consultants:   None.    Procedures: echocardiogram.    Antimicrobials: zithromax and cefepime.    Subjective: Feels much better, no chest pain, sob.   Objective: Vitals:   06/21/16 0624 06/21/16 0737 06/21/16 1257 06/21/16 1323  BP: 135/65  (!) 113/55   Pulse: (!) 53  (!) 54   Resp: 20  20   Temp: 97.6 F (36.4 C)  97.7 F (36.5 C)   TempSrc: Oral  Oral   SpO2: 99% 98% 100% 98%  Weight:      Height:        Intake/Output Summary (Last 24 hours) at 06/21/16 1445 Last data filed at 06/21/16 1258  Gross per 24 hour  Intake              460 ml  Output  5150 ml  Net            -4690 ml   Filed Weights   06/19/16 1230 06/19/16 1708 06/20/16 1654  Weight: (!) 149.2 kg (329 lb) (!) 149.2 kg (328 lb 14.8 oz) (!) 149 kg (328 lb 7.8 oz)    Examination:  General exam: Appears calm and comfortable  Respiratory system: scattered wheezing, no rhonchi. Cardiovascular system: S1 & S2 heard, RRR. No JVD, murmurs, rubs, gallops or clicks. No pedal edema. Gastrointestinal system: Abdomen is nondistended, soft and nontender. No organomegaly or masses felt. Normal bowel sounds heard. Central nervous system: Alert and oriented. No focal neurological deficits. Extremities: Symmetric 5 x 5 power. Skin: No rashes, lesions or  ulcers Psychiatry: Judgement and insight appear normal. Mood & affect appropriate.     Data Reviewed: I have personally reviewed following labs and imaging studies  CBC:  Recent Labs Lab 06/19/16 1322 06/20/16 0616 06/21/16 0518  WBC 0.3* 0.3* 0.4*  NEUTROABS 0.0*  --  0.1*  HGB 10.0* 9.1* 9.9*  HCT 31.9* 28.0* 30.5*  MCV 80.8 79.5 78.4  PLT 65* 74* 99*   Basic Metabolic Panel:  Recent Labs Lab 06/19/16 1322 06/20/16 0616 06/21/16 0518  NA 133* 133* 134*  K 4.4 4.5 4.5  CL 103 105 102  CO2 21* 18* 21*  GLUCOSE 109* 254* 203*  BUN 23* 30* 34*  CREATININE 1.44* 1.46* 1.52*  CALCIUM 7.7* 7.3* 7.6*   GFR: Estimated Creatinine Clearance: 51 mL/min (A) (by C-G formula based on SCr of 1.52 mg/dL (H)). Liver Function Tests: No results for input(s): AST, ALT, ALKPHOS, BILITOT, PROT, ALBUMIN in the last 168 hours. No results for input(s): LIPASE, AMYLASE in the last 168 hours. No results for input(s): AMMONIA in the last 168 hours. Coagulation Profile: No results for input(s): INR, PROTIME in the last 168 hours. Cardiac Enzymes: No results for input(s): CKTOTAL, CKMB, CKMBINDEX, TROPONINI in the last 168 hours. BNP (last 3 results) No results for input(s): PROBNP in the last 8760 hours. HbA1C: No results for input(s): HGBA1C in the last 72 hours. CBG:  Recent Labs Lab 06/20/16 1126 06/20/16 1642 06/20/16 2046 06/21/16 0750 06/21/16 1133  GLUCAP 261* 226* 322* 222* 278*   Lipid Profile: No results for input(s): CHOL, HDL, LDLCALC, TRIG, CHOLHDL, LDLDIRECT in the last 72 hours. Thyroid Function Tests: No results for input(s): TSH, T4TOTAL, FREET4, T3FREE, THYROIDAB in the last 72 hours. Anemia Panel: No results for input(s): VITAMINB12, FOLATE, FERRITIN, TIBC, IRON, RETICCTPCT in the last 72 hours. Sepsis Labs:  Recent Labs Lab 06/19/16 1322 06/19/16 1333 06/21/16 0518  PROCALCITON 0.19  --  0.13  LATICACIDVEN  --  1.08  --     Recent Results (from  the past 240 hour(s))  Culture, blood (routine x 2)     Status: None (Preliminary result)   Collection Time: 06/19/16  1:57 PM  Result Value Ref Range Status   Specimen Description BLOOD LEFT HAND  Final   Special Requests BOTTLES DRAWN AEROBIC AND ANAEROBIC 4CC EACH  Final   Culture NO GROWTH 2 DAYS  Final   Report Status PENDING  Incomplete  Culture, sputum-assessment     Status: None   Collection Time: 06/19/16  5:15 PM  Result Value Ref Range Status   Specimen Description SPUTUM  Final   Special Requests NONE  Final   Sputum evaluation   Final    Sputum specimen not acceptable for testing.  Please recollect.   SPOKE WITH  CIVENS,L @ 0953 ON 4.1.18 TO PLEASE RECOLLECT BY BAYSE,L   Report Status 06/20/2016 FINAL  Final  Culture, blood (routine x 2)     Status: None (Preliminary result)   Collection Time: 06/19/16  5:56 PM  Result Value Ref Range Status   Specimen Description BLOOD RIGHT HAND  Final   Special Requests BOTTLES DRAWN AEROBIC AND ANAEROBIC 6CC EACH  Final   Culture NO GROWTH 2 DAYS  Final   Report Status PENDING  Incomplete  MRSA PCR Screening     Status: None   Collection Time: 06/19/16  6:17 PM  Result Value Ref Range Status   MRSA by PCR NEGATIVE NEGATIVE Final    Comment:        The GeneXpert MRSA Assay (FDA approved for NASAL specimens only), is one component of a comprehensive MRSA colonization surveillance program. It is not intended to diagnose MRSA infection nor to guide or monitor treatment for MRSA infections.          Radiology Studies: No results found.      Scheduled Meds: . atorvastatin  40 mg Oral q1800  . azithromycin  500 mg Intravenous Q24H  . carvedilol  25 mg Oral BID WC  . ceFEPime (MAXIPIME) IV  2 g Intravenous Q8H  . ferrous sulfate  325 mg Oral BID WC  . furosemide  40 mg Intravenous Q12H  . insulin aspart  0-20 Units Subcutaneous TID WC  . insulin aspart  10 Units Subcutaneous TID WC  . insulin glargine  10 Units  Subcutaneous QHS  . ipratropium-albuterol  3 mL Nebulization Q6H  . levothyroxine  100 mcg Oral QAC breakfast  . methylPREDNISolone (SOLU-MEDROL) injection  60 mg Intravenous Q6H  . polyethylene glycol  17 g Oral Daily  . senna-docusate  2 tablet Oral BID   Continuous Infusions:   LOS: 2 days    Time spent: 35 MINUTES.     Hosie Poisson, MD [facility] Pager [phone]  If 7PM-7AM, please contact night-coverage [email or web address] Password [email or web address] 06/21/2016, 2:45 PM

## 2016-06-21 NOTE — Care Management Note (Addendum)
Case Management Note  Patient Details  Name: [name] MRN: [id number] Date of Birth: Apr 02, 1945  Subjective/Objective:                  Pt admitted with resp failure secondary to acute bronchitis/CHF. Chart reviewed for CM needs. Pt is from home, lives alone and ind with ADL's. She is getting chemo for mult myeloma presently. She has PCP and specialty providers. She has insurance with drug coverage and transportation to appointments. She has no HH or DME needs pta. Plans for return home with self care.   Action/Plan: Anticipate home with self care. No needs anticipated. Pt on [place] registry and enrolled in [place] [facility] calls at [place].  Expected Discharge Date:      06/24/2016            Expected Discharge Plan:  Home/Self Care  In-House Referral:  NA  Discharge planning Services  CM Consult  Post Acute Care Choice:  NA Choice offered to:  NA  Status of Service:  In process, will continue to follow  Sherald Barge, RN 06/21/2016, 3:04 PM

## 2016-06-21 NOTE — Evaluation (Signed)
Physical Therapy Evaluation Patient Details Name: [name] MRN: [id number] DOB: 1945/03/23 Today's Date: 06/21/2016   History of Present Illness  71 y.o. female with a history of multiple myeloma currently undergoing treatment with Xgeva and Darzalex weekly, systolic heart failure with an EF of 40% on echocardiogram in 2016, thrombus cytopenia, neutropenia, GERD, morbid obesity, hypertension, diabetes type 2 on insulin, hypothyroidism. Patient seen in the emergency apartment for a week of worsening shortness of breath is worse with exertion and improved with rest. On thursday, she was diagnosed with neutropenia and started on Bactrim and given neutropenic precautions. Today she was brought in because of audible wheezing was heard across the house. She was extremely short of breath headaches cream difficult time ambulating. She has been quite congested and does have cough with clear, thick sputum production, which has been worsening over the past several days.  Clinical Impression  Pt received in bed, and was agreeable to PT evaluation.  She lives alone, and is normally independent with unlimited community ambulation, and all ADL's, and IADL's.  During PT evaluation she demonstrated all functional mobility at independent or modified independent level.  She was able to ambulate 33f with no device in the room.  Further distance limited due to pt currently being neutrapenic.  SpO2 remained 97% on RA.  At this point, she does not demonstrate need for continued skilled PT, therefore, will sign off.     Follow Up Recommendations No PT follow up    Equipment Recommendations  None recommended by PT    Recommendations for Other Services       Precautions / Restrictions Precautions Precautions: Fall Precaution Comments: 3 weeks ago, she slid down out of computer chair when leaning forward to pick up an envelope she had dropped.  Had to call dtr and grandson to come assist her up off the floor.    Restrictions Weight Bearing Restrictions: No      Mobility  Bed Mobility Overal bed mobility: Modified Independent                Transfers Overall transfer level: Modified independent Equipment used: None                Ambulation/Gait Ambulation/Gait assistance: Independent Ambulation Distance (Feet): 40 Feet Assistive device: None Gait Pattern/deviations: Wide base of support;WFL(Within Functional Limits)     General Gait Details: Pt ambulated back and forth in the room several times.  SpO2 97% at end while on RA.   Stairs            Wheelchair Mobility    Modified Rankin (Stroke Patients Only)       Balance Overall balance assessment: History of Falls;Independent                                           Pertinent Vitals/Pain Pain Assessment: No/denies pain    Home Living   Living Arrangements: Alone Available Help at Discharge: Available PRN/intermittently (dtrs are available via telephone. ) Type of Home: Apartment (handicapp accessible. ) Home Access: Level entry     Home Layout: One level Home Equipment: Shower seat Additional Comments: Pt used to have a RW and a cane, but lent them out and never received them back.  Her shower chair is in storage.      Prior Function Level of Independence: Independent   Gait / Transfers Assistance  Needed: unlimited community ambulation.  Driving  ADL's / Homemaking Assistance Needed: independent        Hand Dominance   Dominant Hand: Right    Extremity/Trunk Assessment                Communication   Communication: No difficulties  Cognition Arousal/Alertness: Awake/alert Behavior During Therapy: WFL for tasks assessed/performed Overall Cognitive Status: Within Functional Limits for tasks assessed                                        General Comments      Exercises     Assessment/Plan    PT Assessment Patent does not need any  further PT services  PT Problem List         PT Treatment Interventions      PT Goals (Current goals can be found in the Care Plan section)  Acute Rehab PT Goals PT Goal Formulation: All assessment and education complete, DC therapy    Frequency     Barriers to discharge        Co-evaluation               End of Session Equipment Utilized During Treatment: Gait belt Activity Tolerance: Patient tolerated treatment well Patient left: in chair;with call bell/phone within reach Nurse Communication: Mobility status (Mobility sheet left in the room. ) PT Visit Diagnosis: Other abnormalities of gait and mobility (R26.89);Muscle weakness (generalized) (M62.81)    Time: 8264-1583 PT Time Calculation (min) (ACUTE ONLY): 17 min   Charges:   PT Evaluation $PT Eval Low Complexity: 1 Procedure     PT G Codes:   PT G-Codes **NOT FOR INPATIENT CLASS** Functional Assessment Tool Used: AM-PAC 6 Clicks Basic Mobility;Clinical judgement Functional Limitation: Mobility: Walking and moving around Mobility: Walking and Moving Around Current Status (E9407): At least 1 percent but less than 20 percent impaired, limited or restricted Mobility: Walking and Moving Around Goal Status [phone]): At least 1 percent but less than 20 percent impaired, limited or restricted Mobility: Walking and Moving Around Discharge Status [phone]): At least 1 percent but less than 20 percent impaired, limited or restricted    Beth Colbi Staubs, PT, DPT X: [phone]

## 2016-06-21 NOTE — Progress Notes (Signed)
Inpatient Diabetes Program Recommendations  AACE/ADA: New Consensus Statement on Inpatient Glycemic Control (2015)  Target Ranges:  Prepandial:   less than 140 mg/dL      Peak postprandial:   less than 180 mg/dL (1-2 hours)      Critically ill patients:  140 - 180 mg/dL  Results for MATAYA, KILDUFF (MRN [id number]) as of 06/21/2016 10:55  Ref. Range 06/20/2016 07:45 06/20/2016 11:26 06/20/2016 16:42 06/20/2016 20:46 06/21/2016 07:50  Glucose-Capillary Latest Ref Range: 65 - 99 mg/dL 227 (H) 261 (H) 226 (H) 322 (H) 222 (H)    Review of Glycemic Control  Diabetes history: DM2 Outpatient Diabetes medications: Humalog 20-25 units TID with meals Current orders for Inpatient glycemic control: Lantus 10 units QHS, Novolog 0-20 units TID with meals, Novolog 0-5 units QHS, Novolog 10 units TID with meals for meal coverage  Inpatient Diabetes Program Recommendations: Insulin - Basal: If steroids are continued as ordered, please consider increasing Lantus to 15 units QHS. Insulin - Meal Coverage:  If steroids are continued as ordered, please consider increasing meal coverage to Novolog 15 units TID with meals.  Thanks, Barnie Alderman, RN, MSN, CDE Diabetes Coordinator Inpatient Diabetes Program [phone] (Team Pager from 8am to 5pm)

## 2016-06-21 NOTE — Progress Notes (Signed)
CRITICAL VALUE ALERT  Critical value received:  WBC=0.4  Date of notification:  06/21/16  Time of notification:  0707  Critical value read back:Yes.    Nurse who received alert:  Concha Pyo, RN  MD notified (1st page):    Time of first page:    MD notified (2nd page):  Time of second page:  Responding MD:    Time MD responded:  Value is higher than previously reported critical value.  MD not notified

## 2016-06-22 LAB — BASIC METABOLIC PANEL
ANION GAP: 12 (ref 5–15)
BUN: 43 mg/dL — AB (ref 6–20)
CALCIUM: 7.5 mg/dL — AB (ref 8.9–10.3)
CO2: 23 mmol/L (ref 22–32)
Chloride: 99 mmol/L — ABNORMAL LOW (ref 101–111)
Creatinine, Ser: 1.54 mg/dL — ABNORMAL HIGH (ref 0.44–1.00)
GFR calc Af Amer: 39 mL/min — ABNORMAL LOW (ref >60)
GFR, EST NON AFRICAN AMERICAN: 33 mL/min — AB (ref >60)
Glucose, Bld: 195 mg/dL — ABNORMAL HIGH (ref 65–99)
POTASSIUM: 4.3 mmol/L (ref 3.5–5.1)
SODIUM: 134 mmol/L — AB (ref 135–145)

## 2016-06-22 LAB — GLUCOSE, CAPILLARY
GLUCOSE-CAPILLARY: 200 mg/dL — AB (ref 65–99)
GLUCOSE-CAPILLARY: 193 mg/dL — AB (ref 65–99)
Glucose-Capillary: 244 mg/dL — ABNORMAL HIGH (ref 65–99)
Glucose-Capillary: 192 mg/dL — ABNORMAL HIGH (ref 65–99)

## 2016-06-22 LAB — CBC WITH DIFFERENTIAL/PLATELET
BASOS ABS: 0 10*3/uL (ref 0.0–0.1)
Basophils Relative: 0 %
EOS ABS: 0 10*3/uL (ref 0.0–0.7)
Eosinophils Relative: 0 %
HCT: 31.1 % — ABNORMAL LOW (ref 36.0–46.0)
Hemoglobin: 10.1 g/dL — ABNORMAL LOW (ref 12.0–15.0)
LYMPHS PCT: 49 %
Lymphs Abs: 0.3 10*3/uL — ABNORMAL LOW (ref 0.7–4.0)
MCH: 25.4 pg — ABNORMAL LOW (ref 26.0–34.0)
MCHC: 32.5 g/dL (ref 30.0–36.0)
MCV: 78.1 fL (ref 78.0–100.0)
Monocytes Absolute: 0.1 10*3/uL (ref 0.1–1.0)
Monocytes Relative: 16 %
NEUTROS PCT: 35 %
Neutro Abs: 0.2 10*3/uL — ABNORMAL LOW (ref 1.7–7.7)
PLATELETS: 130 10*3/uL — AB (ref 150–400)
RBC: 3.98 MIL/uL (ref 3.87–5.11)
RDW: 18.8 % — AB (ref 11.5–15.5)
WBC: 0.6 10*3/uL — AB (ref 4.0–10.5)

## 2016-06-22 LAB — TSH: TSH: 0.644 u[IU]/mL (ref 0.350–4.500)

## 2016-06-22 MED ORDER — IPRATROPIUM-ALBUTEROL 0.5-2.5 (3) MG/3ML IN SOLN
3.0000 mL | Freq: Two times a day (BID) | RESPIRATORY_TRACT | Status: DC
  Administered 2016-06-22 – 2016-06-24 (×4): 3 mL via RESPIRATORY_TRACT
  Filled 2016-06-22 (×4): qty 3

## 2016-06-22 MED ORDER — FUROSEMIDE 10 MG/ML IJ SOLN
40.0000 mg | Freq: Every day | INTRAMUSCULAR | Status: DC
  Administered 2016-06-23 – 2016-06-24 (×2): 40 mg via INTRAVENOUS
  Filled 2016-06-22 (×3): qty 4

## 2016-06-22 MED ORDER — METHYLPREDNISOLONE SODIUM SUCC 40 MG IJ SOLR
40.0000 mg | Freq: Two times a day (BID) | INTRAMUSCULAR | Status: DC
  Administered 2016-06-22 – 2016-06-23 (×2): 40 mg via INTRAVENOUS
  Filled 2016-06-22 (×2): qty 1

## 2016-06-22 NOTE — Progress Notes (Signed)
Inpatient Diabetes Program Recommendations  AACE/ADA: New Consensus Statement on Inpatient Glycemic Control (2015)  Target Ranges:  Prepandial:   less than 140 mg/dL      Peak postprandial:   less than 180 mg/dL (1-2 hours)      Critically ill patients:  140 - 180 mg/dL   Results for [name], [name] (MRN [id number]) as of 06/22/2016 08:09  Ref. Range 06/21/2016 07:50 06/21/2016 11:33 06/21/2016 16:31 06/21/2016 21:02 06/22/2016 07:35  Glucose-Capillary Latest Ref Range: 65 - 99 mg/dL 222 (H)  Novolog 17 units 278 (H)  Novolog 21 units 242 (H)  Novolog 17 units 261 (H)  Lantus 15 units 200 (H)   Review of Glycemic Control  Diabetes history: DM2 Outpatient Diabetes medications: Humalog 20-25 units TID with meals Current orders for Inpatient glycemic control: Lantus 15 units QHS, Novolog 0-20 units TID with meals, Novolog 0-5 units QHS, Novolog 10 units TID with meals for meal coverage  Inpatient Diabetes Program Recommendations:  Insulin - Meal Coverage:  If steroids are continued as ordered, please consider increasing meal coverage to Novolog 15 units TID with meals. Insulin-Correction: Please consider ordering Novolog 0-5 units QHS for bedtime correction scale.  Thanks, Barnie Alderman, RN, MSN, CDE Diabetes Coordinator Inpatient Diabetes Program [phone] (Team Pager from 8am to 5pm)

## 2016-06-22 NOTE — Consult Note (Signed)
CARDIOLOGY CONSULT NOTE       Patient ID: [name] MRN: [id number] DOB/AGE: 04-06-1945 71 y.o.  Admit date: 06/19/2016 Referring Physician: Karleen Hampshire Primary Physician: Maggie Font, MD Primary Cardiologist: Branch Reason for Consultation: CHF  Active Problems:   Essential hypertension   CKD (chronic kidney disease) stage 3, GFR 30-59 ml/min   Chronic systolic congestive heart failure ([place])   Diabetes mellitus with complication (HCC)   Multiple myeloma (HCC)   Chronic kidney disease (CKD), stage III (moderate)   Acute bronchitis   SOB (shortness of breath)   Neutropenia (HCC)   HPI:  71 y.o. asked to evaluate/consult for congestive heart failure by Dr Karleen Hampshire.  She has known non ischemic DCM. Echo done 07/2013 with moderate LVH EF 40-45%. AICD removed. Admitted to hospital with neurtopenia after Rx for myeloma with dyspnea And combination of pneumonia, URI and CHF. Has been covered with antibiotics, diuresed, and given steroids. She is improving But still not at baseline No chest pain palpitations. CXR with mild cephalization and BNP only 199  F/U echo yesterday at [facility]  With diffuse hypokinesis and EF 30-35% .  She sees Dr Tressie Stalker and a doctor Melba Coon  in [place] for her myeloma and had her last Rx About a week ago She has mild CRF and OSA with morbid obesity which adds to problems. Currently feeling better with good sats   ROS All other systems reviewed and negative except as noted above  Past Medical History:  Diagnosis Date  . Anemia    takes Ferrous Fumarate 2 times week  . Arthritis    ankles  . Blood transfusion    hx of-32yr ago  . Cardiomyopathy secondary   . Cataract    bilateral and immature  . [place]) [place]    takes Lasix prn, Echo 2015 with EF 40%  . Chronic back pain   . Diabetes mellitus    takes Glipizide and Metformin daily  . Dysrhythmia    pt takes Digoxin and Carvedilol daily  . GERD (gastroesophageal reflux  disease)    takes Protonix seldom  . Gout    hx of x 1 time in Aug 2012  . H/O hiatal hernia   . Hemorrhoid   . Hypertension    takes Diovan and Spirololactone daily  . Left leg pain   . Multiple myeloma (HCC)    [place] . Overweight(278.02)   . Peripheral neuropathy ([place]   . Pneumonia    hx of;many yrs ago  . Sleep apnea    doesn't use CPAP  . Systolic heart failure    Chronic  . Thyroid nodule    right    Family History  Problem Relation Age of Onset  . Kidney failure Mother   . Heart failure Other   . Anesthesia problems Neg Hx   . Hypotension Neg Hx   . Malignant hyperthermia Neg Hx   . Pseudochol deficiency Neg Hx     Social History   Social History  . Marital status: Widowed    Spouse name: N/A  . Number of children: N/A  . Years of education: N/A   Occupational History  . Not on file.   Social History Main Topics  . Smoking status: Never Smoker  . Smokeless tobacco: Never Used  . Alcohol use 0.0 oz/week     Comment: glass wine occasionally  . Drug use: No  . Sexual activity:  Yes    Birth control/ protection: Surgical   Other Topics Concern  . Not on file   Social History Narrative  . No narrative on file    Past Surgical History:  Procedure Laterality Date  . ABDOMINAL HYSTERECTOMY  1991  . APPENDECTOMY  1991  . CARDIAC DEFIBRILLATOR PLACEMENT  >57yr ago  . Cardiac defirillator removed     removed <390monthremoved d/t shocking pt 15times;pierced sac of heart 1000cc ;drained  . CHOLECYSTECTOMY OPEN  1992  . COLONOSCOPY    . COLONOSCOPY N/A 02/08/2014   slf: 1. NO SOURCE FOR ANEMIA IDENTIFIED. 2. SINGLE polyp removed 3. Moderate divertculosis in the sigmoid colon and descencing colon. 4. Moderate sized internal hemorrhoids.   . Marland Kitchenefibrillator removed    . ESOPHAGOGASTRODUODENOSCOPY N/A 02/08/2014   SLF: No source for anemia identified 2. Black foreign body in cardia 3. Mild erosive gastriitis  (inflammaion0 was found in the gastric antrum; multiple biopsies were performed.   . leg lipoma    . ovarian cyst removed  1991  . stomach stapling surgery  80's  . THYROID LOBECTOMY  07/27/2011   Procedure: THYROID LOBECTOMY;  Surgeon: ErGayland CurryMD,FACS;  [facility] [facility] Service: General;  Laterality: Right;  . TUBAL LIGATION  1971  . tummy tuck  1991     . atorvastatin  40 mg Oral q1800  . azithromycin  500 mg Intravenous Q24H  . carvedilol  25 mg Oral BID WC  . ceFEPime (MAXIPIME) IV  2 g Intravenous Q8H  . ferrous sulfate  325 mg Oral BID WC  . [START ON 06/23/2016] furosemide  40 mg Intravenous Daily  . insulin aspart  0-20 Units Subcutaneous TID WC  . insulin aspart  10 Units Subcutaneous TID WC  . insulin glargine  15 Units Subcutaneous QHS  . ipratropium-albuterol  3 mL Nebulization BID  . levothyroxine  100 mcg Oral QAC breakfast  . methylPREDNISolone (SOLU-MEDROL) injection  40 mg Intravenous Q12H  . polyethylene glycol  17 g Oral Daily  . senna-docusate  2 tablet Oral BID     Physical Exam: Blood pressure (!) 119/59, pulse (!) 59, temperature 97.5 F (36.4 C), temperature source Oral, resp. rate 20, height '5\' 4"'$  (1.626 m), weight (!) 324 lb 11.8 oz (147.3 kg), SpO2 96 %.   Affect appropriate Obese black female  HEENT: normal Neck supple with no adenopathy scar from old AICD under left clavicle  JVP normal no bruits no thyromegaly Lungs course  wheezing and good diaphragmatic motion Heart:  S1/S2 no murmur, no rub, gallop or click PMI normal Abdomen: benighn, BS positve, no tenderness, no AAA no bruit.  No HSM or HJR Distal pulses intact with no bruits No edema Neuro non-focal Skin warm and dry No muscular weakness   Labs:   Lab Results  Component Value Date   WBC 0.6 (LL) 06/22/2016   HGB 10.1 (L) 06/22/2016   HCT 31.1 (L) 06/22/2016   MCV 78.1 06/22/2016   PLT 130 (L) 06/22/2016     Recent Labs Lab 06/22/16 0546  NA 134*  K 4.3  CL 99*    CO2 23  BUN 43*  CREATININE 1.54*  CALCIUM 7.5*  GLUCOSE 195*   Lab Results  Component Value Date   CKTOTAL 101 12/04/2007   CKMB 1.4 12/04/2007   TROPONINI 0.21 (H) 09/18/2014    Lab Results  Component Value Date   CHOL 137 10/13/2015   Lab Results  Component Value Date  HDL 65 10/13/2015   Lab Results  Component Value Date   LDLCALC 55 10/13/2015   Lab Results  Component Value Date   TRIG 83 10/13/2015   Lab Results  Component Value Date   CHOLHDL 2.1 10/13/2015   No results found for: LDLDIRECT    Radiology: Dg Chest 2 View  Result Date: 06/19/2016 CLINICAL DATA:  SOB, Pt c/o SOB x 2 weeks, productive yellow cough, pain when breathing HISTORY OF CHF, DM, CANCER, HTN, MULTIPLE MYELOMA, PNEUMONIA EXAM: CHEST - 2 VIEW COMPARISON:  none FINDINGS: Right IJ port catheter to the mid SVC. No pneumothorax. Coarse perihilar and bibasilar interstitial opacities. Suspect mild central vascular congestion. No pleural effusion. Moderate cardiomegaly.  Atheromatous aorta. Visualized bones unremarkable. IMPRESSION: 1. Cardiomegaly with vascular congestion and coarse perihilar interstitial opacities of uncertain chronicity. Electronically Signed   By: Lucrezia Europe M.D.   On: 06/19/2016 14:11    EKG  SR low voltage PAC/PVC nonspecific ST changes   ASSESSMENT AND PLAN:  CHF: not clear that change in EF clinically significant as she has had moderate reduction in EF for years. Continue iv diuresis Until Cr gets closer to 1.8-2.0 and then change to PO  Add nitrates for pre load reduction BP too soft for hydralazine  URI/Pneumonia:  Clinically this is more respiratory exacerbation with ongoing wheezing despite steroids. Continue broad spectrum Antibiotic coverage and steroids still neurtopenic  Myeloma:  Will have f/u with Dr Tressie Stalker and Melba Coon post d/c   Thyroid:  On replacement consider rechecking TSH while in hosptial  Lab Results  Component Value Date   TSH 1.320 10/24/2013      Signed: Jenkins Rouge 06/22/2016, 11:39 AM

## 2016-06-22 NOTE — Progress Notes (Signed)
Pt refused cpap at this time. She was waiting on the RN to start an IV. Instructed patient on how to start CPAP. Patient was told to contact the RN if she couldn't get the mask on comfortably. I will check back with the patient to ensure that she is safe and comfortable.

## 2016-06-22 NOTE — Progress Notes (Signed)
All four siderails up per patient request.

## 2016-06-22 NOTE — Progress Notes (Signed)
PROGRESS NOTE    [name]  PPI:[id number] DOB: 05-Jul-1945 DOA: 06/19/2016 PCP: Maggie Font, MD    Brief Narrative: [name] is a 71 y.o. female with a history of multiple myeloma currently undergoing treatment with Xgeva and Darzalex weekly, systolic heart failure with an EF of 40% on echocardiogram in 2016, thrombocytopenia, neutropenia, GERD, morbid obesity, hypertension, diabetes type 2 on insulin, hypothyroidism,  Multiple myeloma, presents with a week history of sob, was found to be diffusely wheezing and productive cough. She was admitted for further evaluation.   Assessment & Plan:   Active Problems:   Essential hypertension   CKD (chronic kidney disease) stage 3, GFR 30-59 ml/min   Chronic systolic congestive heart failure (HCC)   Diabetes mellitus with complication (HCC)   Multiple myeloma (HCC)   Chronic kidney disease (CKD), stage III (moderate)   Acute bronchitis   SOB (shortness of breath)   Neutropenia (HCC)  Acute respiratory failure secondary to possible acute on chronic bronchitis and acute on chronic systolic heart failure: Admitted to telemetry, and started her on IV solumedrol, bronchodilators and cough medication. Blood cultures and sputum cultures done and pending. They have been negative so far. Taper solumedrol to 40 mg every 12 hours, Her breathing and wheezing has improved.  CXR does not show any definite pneumonia but has interstitial fluid.  Started her on IV lasix 40 mg IV Q12h, DAILY weights and strict in take and output. Her creatinine bumped slightly, up , cut down on lasix to daily.  She has diuresed about 9 liters since admission. Echocardiogram shows worsening LVEF to 30 % from 40% from 2016. Cardiology consulted for recommendations.  Resume coreg.  Potassium wnl.    Diabetes mellitus:  CBG (last 3)   Recent Labs  06/22/16 0735 06/22/16 1138 06/22/16 1632  GLUCAP 200* 244* 193*    hgba1c  Pending.  Elevated cbg's  from solumedrol.  Increase lantus from 10 to 15 units.    Hypothyroidism: resume synthroid.   MUltiple Myeloma:  Her Pomalyst on hold for now. Called Dr Tressie Stalker to notify of the patient's admission.    Neutropenia:  Her ANC is improved to 200.  Monitor for now. Blood cultures done and pending.    Pancytopenia: suspect from the MM.  Monitor for now.   Stage 3 CKD: Creatinine slightly bumped from  baseline.    DVT prophylaxis: scd's Code Status: full code.  Family Communication: family at bedside.  Disposition Plan: pending further eval.    Consultants:   Cardiology.    Procedures: echocardiogram.    Antimicrobials: zithromax and cefepime. Since admission.   Subjective: Feels much better, no chest pain, sob.   Objective: Vitals:   06/22/16 0215 06/22/16 0500 06/22/16 0836 06/22/16 1436  BP:  (!) 119/59  123/61  Pulse:  (!) 59  (!) 56  Resp:  20  20  Temp:  97.5 F (36.4 C)  98.2 F (36.8 C)  TempSrc:  Oral  Oral  SpO2: 97% 98% 96% 100%  Weight:  (!) 147.3 kg (324 lb 11.8 oz)    Height:        Intake/Output Summary (Last 24 hours) at 06/22/16 1756 Last data filed at 06/22/16 1436  Gross per 24 hour  Intake              750 ml  Output             3750 ml  Net            -  3000 ml   Filed Weights   06/19/16 1708 06/20/16 1654 06/22/16 0500  Weight: (!) 149.2 kg (328 lb 14.8 oz) (!) 149 kg (328 lb 7.8 oz) (!) 147.3 kg (324 lb 11.8 oz)    Examination:  General exam: Appears calm and comfortable  Respiratory system: scattered wheezing, no rhonchi. Air entry fair.  Cardiovascular system: S1 & S2 heard, RRR. No JVD, murmurs, rubs, gallops or clicks. No pedal edema. Gastrointestinal system: Abdomen is nondistended, soft and nontender. No organomegaly or masses felt. Normal bowel sounds heard. Central nervous system: Alert and oriented. No focal neurological deficits. Extremities: Symmetric 5 x 5 power. Skin: No rashes, lesions or ulcers Psychiatry:  Judgement and insight appear normal. Mood & affect appropriate.     Data Reviewed: I have personally reviewed following labs and imaging studies  CBC:  Recent Labs Lab 06/19/16 1322 06/20/16 0616 06/21/16 0518 06/22/16 0546  WBC 0.3* 0.3* 0.4* 0.6*  NEUTROABS 0.0*  --  0.1* 0.2*  HGB 10.0* 9.1* 9.9* 10.1*  HCT 31.9* 28.0* 30.5* 31.1*  MCV 80.8 79.5 78.4 78.1  PLT 65* 74* 99* 256*   Basic Metabolic Panel:  Recent Labs Lab 06/19/16 1322 06/20/16 0616 06/21/16 0518 06/22/16 0546  NA 133* 133* 134* 134*  K 4.4 4.5 4.5 4.3  CL 103 105 102 99*  CO2 21* 18* 21* 23  GLUCOSE 109* 254* 203* 195*  BUN 23* 30* 34* 43*  CREATININE 1.44* 1.46* 1.52* 1.54*  CALCIUM 7.7* 7.3* 7.6* 7.5*   GFR: Estimated Creatinine Clearance: 49.9 mL/min (A) (by C-G formula based on SCr of 1.54 mg/dL (H)). Liver Function Tests: No results for input(s): AST, ALT, ALKPHOS, BILITOT, PROT, ALBUMIN in the last 168 hours. No results for input(s): LIPASE, AMYLASE in the last 168 hours. No results for input(s): AMMONIA in the last 168 hours. Coagulation Profile: No results for input(s): INR, PROTIME in the last 168 hours. Cardiac Enzymes: No results for input(s): CKTOTAL, CKMB, CKMBINDEX, TROPONINI in the last 168 hours. BNP (last 3 results) No results for input(s): PROBNP in the last 8760 hours. HbA1C: No results for input(s): HGBA1C in the last 72 hours. CBG:  Recent Labs Lab 06/21/16 1631 06/21/16 2102 06/22/16 0735 06/22/16 1138 06/22/16 1632  GLUCAP 242* 261* 200* 244* 193*   Lipid Profile: No results for input(s): CHOL, HDL, LDLCALC, TRIG, CHOLHDL, LDLDIRECT in the last 72 hours. Thyroid Function Tests:  Recent Labs  06/20/16 0616  TSH 0.644   Anemia Panel: No results for input(s): VITAMINB12, FOLATE, FERRITIN, TIBC, IRON, RETICCTPCT in the last 72 hours. Sepsis Labs:  Recent Labs Lab 06/19/16 1322 06/19/16 1333 06/21/16 0518  PROCALCITON 0.19  --  0.13  LATICACIDVEN  --   1.08  --     Recent Results (from the past 240 hour(s))  Culture, blood (routine x 2)     Status: None (Preliminary result)   Collection Time: 06/19/16  1:57 PM  Result Value Ref Range Status   Specimen Description BLOOD LEFT HAND  Final   Special Requests BOTTLES DRAWN AEROBIC AND ANAEROBIC 4CC EACH  Final   Culture NO GROWTH 3 DAYS  Final   Report Status PENDING  Incomplete  Culture, sputum-assessment     Status: None   Collection Time: 06/19/16  5:15 PM  Result Value Ref Range Status   Specimen Description SPUTUM  Final   Special Requests NONE  Final   Sputum evaluation   Final    Sputum specimen not acceptable for testing.  Please recollect.   SPOKE WITH [facility],L @ [phone] ON 4.1.18 TO PLEASE RECOLLECT BY [facility]AY[facility],L   Report Status 06/20/2016 FINAL  Final  Culture, blood (routine x 2)     Status: None (Preliminary result)   Collection Time: 06/19/16  5:56 PM  Result Value Ref Range Status   Specimen Description BLOOD RIGHT HAND  Final   Special Requests BOTTLES DRAWN AEROBIC AND ANAEROBIC 6CC EACH  Final   Culture NO GROWTH 3 DAYS  Final   Report Status PENDING  Incomplete  MRSA PCR Screening     Status: None   Collection Time: 06/19/16  6:17 PM  Result Value Ref Range Status   MRSA by PCR NEGATIVE NEGATIVE Final    Comment:        The GeneXpert MRSA Assay (FDA approved for NASAL specimens only), is one component of a comprehensive MRSA colonization surveillance program. It is not intended to diagnose MRSA infection nor to guide or monitor treatment for MRSA infections.          Radiology Studies: No results found.      Scheduled Meds: . atorvastatin  40 mg Oral q1800  . azithromycin  500 mg Intravenous Q24H  . carvedilol  25 mg Oral BID WC  . ceFEPime (MAXIPIME) IV  2 g Intravenous Q8H  . ferrous sulfate  325 mg Oral BID WC  . [START ON 06/23/2016] furosemide  40 mg Intravenous Daily  . insulin aspart  0-20 Units Subcutaneous TID WC  . insulin aspart  10  Units Subcutaneous TID WC  . insulin glargine  15 Units Subcutaneous QHS  . ipratropium-albuterol  3 mL Nebulization BID  . levothyroxine  100 mcg Oral QAC breakfast  . methylPREDNISolone (SOLU-MEDROL) injection  40 mg Intravenous Q12H  . polyethylene glycol  17 g Oral Daily  . senna-docusate  2 tablet Oral BID   Continuous Infusions:   LOS: 3 days    Time spent: 35 MINUTES.     Hosie Poisson, MD [facility] Pager [phone]  If 7PM-7AM, please contact night-coverage [email or web address] Password [email or web address] 06/22/2016, 5:56 PM

## 2016-06-23 ENCOUNTER — Inpatient Hospital Stay (HOSPITAL_COMMUNITY): Payer: Medicare Other

## 2016-06-23 DIAGNOSIS — I519 Heart disease, unspecified: Secondary | ICD-10-CM

## 2016-06-23 DIAGNOSIS — D708 Other neutropenia: Secondary | ICD-10-CM

## 2016-06-23 DIAGNOSIS — G4733 Obstructive sleep apnea (adult) (pediatric): Secondary | ICD-10-CM

## 2016-06-23 DIAGNOSIS — I5023 Acute on chronic systolic (congestive) heart failure: Secondary | ICD-10-CM

## 2016-06-23 DIAGNOSIS — J208 Acute bronchitis due to other specified organisms: Secondary | ICD-10-CM

## 2016-06-23 DIAGNOSIS — J181 Lobar pneumonia, unspecified organism: Secondary | ICD-10-CM

## 2016-06-23 LAB — HEPATIC FUNCTION PANEL
ALBUMIN: 3 g/dL — AB (ref 3.5–5.0)
ALT: 45 U/L (ref 14–54)
AST: 16 U/L (ref 15–41)
Alkaline Phosphatase: 98 U/L (ref 38–126)
Bilirubin, Direct: <0.1 mg/dL — ABNORMAL LOW (ref 0.1–0.5)
TOTAL PROTEIN: 6.6 g/dL (ref 6.5–8.1)
Total Bilirubin: 0.4 mg/dL (ref 0.3–1.2)

## 2016-06-23 LAB — GLUCOSE, CAPILLARY
GLUCOSE-CAPILLARY: 176 mg/dL — AB (ref 65–99)
GLUCOSE-CAPILLARY: 182 mg/dL — AB (ref 65–99)
Glucose-Capillary: 240 mg/dL — ABNORMAL HIGH (ref 65–99)
Glucose-Capillary: 217 mg/dL — ABNORMAL HIGH (ref 65–99)

## 2016-06-23 LAB — CBC WITH DIFFERENTIAL/PLATELET
BASOS PCT: 0 %
Basophils Absolute: 0 10*3/uL (ref 0.0–0.1)
EOS PCT: 0 %
Eosinophils Absolute: 0 10*3/uL (ref 0.0–0.7)
HEMATOCRIT: 32.1 % — AB (ref 36.0–46.0)
HEMOGLOBIN: 10.4 g/dL — AB (ref 12.0–15.0)
LYMPHS PCT: 47 %
Lymphs Abs: 0.5 10*3/uL — ABNORMAL LOW (ref 0.7–4.0)
MCH: 25.3 pg — AB (ref 26.0–34.0)
MCHC: 32.4 g/dL (ref 30.0–36.0)
MCV: 78.1 fL (ref 78.0–100.0)
MONO ABS: 0.2 10*3/uL (ref 0.1–1.0)
MONOS PCT: 24 %
NEUTROS PCT: 29 %
Neutro Abs: 0.3 10*3/uL — ABNORMAL LOW (ref 1.7–7.7)
PLATELETS: 109 10*3/uL — AB (ref 150–400)
RBC: 4.11 MIL/uL (ref 3.87–5.11)
RDW: 18.8 % — ABNORMAL HIGH (ref 11.5–15.5)
WBC: 1 10*3/uL — AB (ref 4.0–10.5)

## 2016-06-23 LAB — BASIC METABOLIC PANEL
ANION GAP: 12 (ref 5–15)
BUN: 43 mg/dL — ABNORMAL HIGH (ref 6–20)
CHLORIDE: 98 mmol/L — AB (ref 101–111)
CO2: 24 mmol/L (ref 22–32)
Calcium: 7.7 mg/dL — ABNORMAL LOW (ref 8.9–10.3)
Creatinine, Ser: 1.35 mg/dL — ABNORMAL HIGH (ref 0.44–1.00)
GFR calc Af Amer: 45 mL/min — ABNORMAL LOW (ref >60)
GFR, EST NON AFRICAN AMERICAN: 39 mL/min — AB (ref >60)
GLUCOSE: 189 mg/dL — AB (ref 65–99)
POTASSIUM: 4.5 mmol/L (ref 3.5–5.1)
Sodium: 134 mmol/L — ABNORMAL LOW (ref 135–145)

## 2016-06-23 LAB — PROCALCITONIN: Procalcitonin: <0.1 ng/mL

## 2016-06-23 LAB — HEMOGLOBIN A1C
Hgb A1c MFr Bld: 6.2 % — ABNORMAL HIGH (ref 4.8–5.6)
Mean Plasma Glucose: 131 mg/dL

## 2016-06-23 MED ORDER — AZITHROMYCIN 250 MG PO TABS: 500.0000 mg | ORAL_TABLET | Freq: Every day | ORAL | Status: DC

## 2016-06-23 MED ORDER — LEVOFLOXACIN 750 MG PO TABS
750.0000 mg | ORAL_TABLET | Freq: Every day | ORAL | Status: DC
  Administered 2016-06-23: 750 mg via ORAL
  Filled 2016-06-23: qty 1

## 2016-06-23 MED ORDER — METHYLPREDNISOLONE SODIUM SUCC 40 MG IJ SOLR
40.0000 mg | Freq: Every day | INTRAMUSCULAR | Status: DC
  Administered 2016-06-24: 40 mg via INTRAVENOUS
  Filled 2016-06-23: qty 1

## 2016-06-23 MED ORDER — DEXTROSE 5 % IV SOLN
500.0000 mg | INTRAVENOUS | Status: DC
  Filled 2016-06-23: qty 500

## 2016-06-23 NOTE — Progress Notes (Signed)
PROGRESS NOTE    [name]  OHY:[id number] DOB: 07/10/1945 DOA: 06/19/2016 PCP: Maggie Font, MD    Brief Narrative: [name] is a 70 y.o. female with a history of multiple myeloma currently undergoing treatment with Xgeva and Darzalex weekly, systolic heart failure with an EF of 40% on echocardiogram in 2016, thrombocytopenia, neutropenia, GERD, morbid obesity, hypertension, diabetes type 2 on insulin, hypothyroidism,  Multiple myeloma, presents with a week history of sob, was found to be diffusely wheezing and productive cough. She was admitted for further evaluation.   Assessment & Plan:   Acute respiratory failure secondary to possible acute on chronic bronchitis and acute on chronic systolic heart failure EF 30-40%:  on telemetry, and started her on IV solumedrol, bronchodilators and cough medication. Taper down antibiotics to oral Levaquin. She was also started on IV Lasix for CHF, cardiology following, on Coreg which will be continued. Likely discharge in the morning per cardiology.  Hypothyroidism: resumed synthroid.   Multiple Myeloma, with pancytopenia and neutropenia:  Her Pomalyst on hold for now.   Dr Tressie Stalker was notified of the patient's admission. Follow post DC. Continue to monitor.  Stage 3 CKD: Creatinine at baseline.    Diabetes mellitus 2: On Lantus and sliding scale monitor with tapering steroid dose  CBG (last 3)   Recent Labs  06/22/16 2142 06/23/16 0742 06/23/16 1124  GLUCAP 192* 176* 240*    Lab Results  Component Value Date   HGBA1C 6.2 (H) 06/22/2016       DVT prophylaxis: scd's Code Status: full code.  Family Communication: family at bedside.  Disposition Plan: pending further eval.    Consultants:   Cardiology.    Procedures: echocardiogram.    Antimicrobials: zithromax and cefepime. Since admission.   Subjective: Feels much better, no chest pain, sob.   Objective: Vitals:   06/22/16 2006 06/22/16 2144  06/23/16 0527 06/23/16 0902  BP:  (!) 111/53 126/64   Pulse:  (!) 52 (!) 57   Resp:  18 18   Temp:  98.4 F (36.9 C) 97.7 F (36.5 C)   TempSrc:  Oral Oral   SpO2: 98% 100% 100% 99%  Weight:   (!) 143.2 kg (315 lb 11.2 oz)   Height:        Intake/Output Summary (Last 24 hours) at 06/23/16 1423 Last data filed at 06/23/16 1054  Gross per 24 hour  Intake             1610 ml  Output             2200 ml  Net             -590 ml   Filed Weights   06/20/16 1654 06/22/16 0500 06/23/16 0527  Weight: (!) 149 kg (328 lb 7.8 oz) (!) 147.3 kg (324 lb 11.8 oz) (!) 143.2 kg (315 lb 11.2 oz)    Examination:  General exam: Appears calm and comfortable  Respiratory system: scattered wheezing, no rhonchi. Air entry fair.  Cardiovascular system: S1 & S2 heard, RRR. No JVD, murmurs, rubs, gallops or clicks. No pedal edema. Gastrointestinal system: Abdomen is nondistended, soft and nontender. No organomegaly or masses felt. Normal bowel sounds heard. Central nervous system: Alert and oriented. No focal neurological deficits. Extremities: Symmetric 5 x 5 power. Skin: No rashes, lesions or ulcers Psychiatry: Judgement and insight appear normal. Mood & affect appropriate.     Data Reviewed: I have personally reviewed following labs and imaging studies  CBC:  Recent Labs Lab 06/19/16 1322 06/20/16 0616 06/21/16 0518 06/22/16 0546 06/23/16 0510  WBC 0.3* 0.3* 0.4* 0.6* 1.0*  NEUTROABS 0.0*  --  0.1* 0.2* 0.3*  HGB 10.0* 9.1* 9.9* 10.1* 10.4*  HCT 31.9* 28.0* 30.5* 31.1* 32.1*  MCV 80.8 79.5 78.4 78.1 78.1  PLT 65* 74* 99* 130* 376*   Basic Metabolic Panel:  Recent Labs Lab 06/19/16 1322 06/20/16 0616 06/21/16 0518 06/22/16 0546 06/23/16 0510  NA 133* 133* 134* 134* 134*  K 4.4 4.5 4.5 4.3 4.5  CL 103 105 102 99* 98*  CO2 21* 18* 21* 23 24  GLUCOSE 109* 254* 203* 195* 189*  BUN 23* 30* 34* 43* 43*  CREATININE 1.44* 1.46* 1.52* 1.54* 1.35*  CALCIUM 7.7* 7.3* 7.6* 7.5* 7.7*    GFR: Estimated Creatinine Clearance: 55.9 mL/min (A) (by C-G formula based on SCr of 1.35 mg/dL (H)). Liver Function Tests:  Recent Labs Lab 06/23/16 0510  AST 16  ALT 45  ALKPHOS 98  BILITOT 0.4  PROT 6.6  ALBUMIN 3.0*   No results for input(s): LIPASE, AMYLASE in the last 168 hours. No results for input(s): AMMONIA in the last 168 hours. Coagulation Profile: No results for input(s): INR, PROTIME in the last 168 hours. Cardiac Enzymes: No results for input(s): CKTOTAL, CKMB, CKMBINDEX, TROPONINI in the last 168 hours. BNP (last 3 results) No results for input(s): PROBNP in the last 8760 hours. HbA1C:  Recent Labs  06/22/16 0600  HGBA1C 6.2*   CBG:  Recent Labs Lab 06/22/16 1138 06/22/16 1632 06/22/16 2142 06/23/16 0742 06/23/16 1124  GLUCAP 244* 193* 192* 176* 240*   Lipid Profile: No results for input(s): CHOL, HDL, LDLCALC, TRIG, CHOLHDL, LDLDIRECT in the last 72 hours. Thyroid Function Tests: No results for input(s): TSH, T4TOTAL, FREET4, T3FREE, THYROIDAB in the last 72 hours. Anemia Panel: No results for input(s): VITAMINB12, FOLATE, FERRITIN, TIBC, IRON, RETICCTPCT in the last 72 hours. Sepsis Labs:  Recent Labs Lab 06/19/16 1322 06/19/16 1333 06/21/16 0518 06/23/16 0510  PROCALCITON 0.19  --  0.13 <0.10  LATICACIDVEN  --  1.08  --   --     Recent Results (from the past 240 hour(s))  Culture, blood (routine x 2)     Status: None (Preliminary result)   Collection Time: 06/19/16  1:57 PM  Result Value Ref Range Status   Specimen Description BLOOD LEFT HAND  Final   Special Requests BOTTLES DRAWN AEROBIC AND ANAEROBIC 4CC EACH  Final   Culture NO GROWTH 4 DAYS  Final   Report Status PENDING  Incomplete  Culture, sputum-assessment     Status: None   Collection Time: 06/19/16  5:15 PM  Result Value Ref Range Status   Specimen Description SPUTUM  Final   Special Requests NONE  Final   Sputum evaluation   Final    Sputum specimen not  acceptable for testing.  Please recollect.   SPOKE WITH CIVENS,L @ [phone] ON 4.1.18 TO PLEASE RECOLLECT BY BAYSE,L   Report Status 06/20/2016 FINAL  Final  Culture, blood (routine x 2)     Status: None (Preliminary result)   Collection Time: 06/19/16  5:56 PM  Result Value Ref Range Status   Specimen Description BLOOD RIGHT HAND  Final   Special Requests BOTTLES DRAWN AEROBIC AND ANAEROBIC 6CC EACH  Final   Culture NO GROWTH 4 DAYS  Final   Report Status PENDING  Incomplete  MRSA PCR Screening     Status: None  Collection Time: 06/19/16  6:17 PM  Result Value Ref Range Status   MRSA by PCR NEGATIVE NEGATIVE Final    Comment:        The GeneXpert MRSA Assay (FDA approved for NASAL specimens only), is one component of a comprehensive MRSA colonization surveillance program. It is not intended to diagnose MRSA infection nor to guide or monitor treatment for MRSA infections.          Radiology Studies: Dg Chest Port 1 View  Result Date: 06/23/2016 CLINICAL DATA:  Community acquired pneumonia . EXAM: PORTABLE CHEST 1 VIEW COMPARISON:  06/19/2016 . FINDINGS: Port-A-Cath noted in stable position. Cardiomegaly with stable mild pulmonary vascular congestion and interstitial prominence. No change from prior exam. IMPRESSION: 1.  Port-A-Cath noted in stable position. 2. Cardiomegaly with mild pulmonary vascular prominence and interstitial prominence again noted without interim change. Mild CHF cannot be excluded. No focal infiltrate noted. Electronically Signed   By: Thomas  Register   On: 06/23/2016 08:05    Scheduled Meds: . atorvastatin  40 mg Oral q1800  . azithromycin  500 mg Intravenous Q24H  . carvedilol  25 mg Oral BID WC  . ceFEPime (MAXIPIME) IV  2 g Intravenous Q8H  . ferrous sulfate  325 mg Oral BID WC  . furosemide  40 mg Intravenous Daily  . insulin aspart  0-20 Units Subcutaneous TID WC  . insulin aspart  10 Units Subcutaneous TID WC  . insulin glargine  15 Units  Subcutaneous QHS  . ipratropium-albuterol  3 mL Nebulization BID  . levothyroxine  100 mcg Oral QAC breakfast  . methylPREDNISolone (SOLU-MEDROL) injection  40 mg Intravenous Q12H  . polyethylene glycol  17 g Oral Daily  . senna-docusate  2 tablet Oral BID   Continuous Infusions:   LOS: 4 days    Time spent: 35 MINUTES.    Signature  SINGH,PRASHANT K M.D on 06/23/2016 at 2:23 PM  Between 7am to 7pm - Pager - [phone] ( page via amion, text pages only, please mention full 10 digit call back number).  After 7pm go to [email or web address] - password [email or web address]      

## 2016-06-23 NOTE — Progress Notes (Signed)
Pharmacy Antibiotic Note  [name] is a 71 y.o. female admitted on 06/19/2016 with SOB / neutropenia.   Pharmacy has been consulted for cefepime dosing.  Plan: Continue Cefepime 2 gm IV q8 hours Continue Zithromax 500mg  IV q24hrs Duration of therapy per MD f/u renal function, cultures and clinical course  Height: 5\' 4"  (162.6 cm) Weight: (!) 315 lb 11.2 oz (143.2 kg) IBW/kg (Calculated) : 54.7  Temp (24hrs), Avg:98.1 F (36.7 C), Min:97.7 F (36.5 C), Max:98.4 F (36.9 C)   Recent Labs Lab 06/19/16 1322 06/19/16 1333 06/20/16 0616 06/21/16 0518 06/22/16 0546 06/23/16 0510  WBC 0.3*  --  0.3* 0.4* 0.6* 1.0*  CREATININE 1.44*  --  1.46* 1.52* 1.54* 1.35*  LATICACIDVEN  --  1.08  --   --   --   --     Estimated Creatinine Clearance: 55.9 mL/min (A) (by C-G formula based on SCr of 1.35 mg/dL (H)).    Allergies  Allergen Reactions  . Doxycycline Rash   Antimicrobials this admission: cefepime 3/31 >>  azithromycin 3/31 >>   Thank you for allowing pharmacy to be a part of this patient's care.  Hart Robinsons A 06/23/2016 10:55 AM

## 2016-06-23 NOTE — Progress Notes (Signed)
qPhysical Therapy Treatment Patient Details Name: [name] MRN: [id number] DOB: [birth year] Today's Date: 06/23/2016    History of Present Illness 71 y.o. female with a history of multiple myeloma currently undergoing treatment with Xgeva and Darzalex weekly, systolic heart failure with an EF of 40% on echocardiogram in 2016, thrombus cytopenia, neutropenia, GERD, morbid obesity, hypertension, diabetes type 2 on insulin, hypothyroidism. Patient seen in the emergency apartment for a week of worsening shortness of breath is worse with exertion and improved with rest. On thursday, she was diagnosed with neutropenia and started on Bactrim and given neutropenic precautions. Today she was brought in because of audible wheezing was heard across the house. She was extremely short of breath headaches cream difficult time ambulating. She has been quite congested and does have cough with clear, thick sputum production, which has been worsening over the past several days.    PT Comments    New orders for PT received.  Pt received sitting up in the chair and was agreeable to PT tx.  Pt demonstrated sit<>stand independently, and ambulated 185f independently.  Pt does not demonstrate need for additional skilled PT and was encouraged to ambulate in hall at least 3x's per day with mask on.     Follow Up Recommendations  No PT follow up     Equipment Recommendations  None recommended by PT    Recommendations for Other Services       Precautions / Restrictions Precautions Precautions: Fall (Neutrapenic precautions) Precaution Comments: 3 weeks ago, she slid down out of computer chair when leaning forward to pick up an envelope she had dropped.  Had to call dtr and grandson to come assist her up off the floor.   Restrictions Weight Bearing Restrictions: No    Mobility  Bed Mobility                  Transfers Overall transfer level: Independent Equipment used: None                 Ambulation/Gait Ambulation/Gait assistance: Independent Ambulation Distance (Feet): 100 Feet Assistive device: None Gait Pattern/deviations: WFL(Within Functional Limits)         Stairs            Wheelchair Mobility    Modified Rankin (Stroke Patients Only)       Balance Overall balance assessment: History of Falls;Independent                                          Cognition Arousal/Alertness: Awake/alert Behavior During Therapy: WFL for tasks assessed/performed Overall Cognitive Status: Within Functional Limits for tasks assessed                                        Exercises      General Comments        Pertinent Vitals/Pain Pain Assessment: No/denies pain    Home Living                      Prior Function            PT Goals (current goals can now be found in the care plan section) Acute Rehab PT Goals PT Goal Formulation: All assessment and education complete, DC therapy    Frequency  PT Plan Current plan remains appropriate    Co-evaluation             End of Session   Activity Tolerance: Patient tolerated treatment well Patient left: in chair;with call bell/phone within reach Nurse Communication: Mobility status PT Visit Diagnosis: Other abnormalities of gait and mobility (R26.89);Muscle weakness (generalized) (M62.81)     Time: 1601-0932 PT Time Calculation (min) (ACUTE ONLY): 13 min  Charges:  $Gait Training: 8-22 mins                    G Codes:       Beth Krystle Oberman, PT, DPT X: [phone]

## 2016-06-23 NOTE — Progress Notes (Signed)
Pt lost IV access before shift change. Several nurses attempted to get a new IV at pt's request as she did not want her port accessed. After several unsuccessful IV attempts I discussed the need to access the port with the patient. She requested that I call the doctor to make sure she would need IV access, after doing so I educated the patient on the importance of taking her IV medications in order to fully recover. She was still reluctant to have it accessed so I reassured her and allowed her time to consider the decision. She has allowed me to access the port and has received her medications through it. Will continue to monitor.

## 2016-06-23 NOTE — Progress Notes (Signed)
Pharmacy Antibiotic Note  [name] is a 71 y.o. female admitted on 06/19/2016 with bronchitis.   Pharmacy has been consulted for PO Levaquin dosing.  Plan: Levaquin 750mg  PO daily at 1800 Monitor labs, progress, c/s Duration of therapy per MD  Height: 5\' 4"  (162.6 cm) Weight: (!) 315 lb 11.2 oz (143.2 kg) IBW/kg (Calculated) : 54.7  Temp (24hrs), Avg:98 F (36.7 C), Min:97.7 F (36.5 C), Max:98.4 F (36.9 C)   Recent Labs Lab 06/19/16 1322 06/19/16 1333 06/20/16 0616 06/21/16 0518 06/22/16 0546 06/23/16 0510  WBC 0.3*  --  0.3* 0.4* 0.6* 1.0*  CREATININE 1.44*  --  1.46* 1.52* 1.54* 1.35*  LATICACIDVEN  --  1.08  --   --   --   --     Estimated Creatinine Clearance: 55.9 mL/min (A) (by C-G formula based on SCr of 1.35 mg/dL (H)).    Allergies  Allergen Reactions  . Doxycycline Rash   Antimicrobials this admission: cefepime 3/31 >> 4/4 azithromycin 3/31 >>4/4 Levaquin 4/4 >>   Thank you for allowing pharmacy to be a part of this patient's care.  Hart Robinsons A 06/23/2016 2:43 PM

## 2016-06-23 NOTE — Progress Notes (Signed)
Pt still doesn't want to go on CPAP until an IV is in place and she has received her medication. Spoke with RN to call me when the patient was ready.

## 2016-06-23 NOTE — Progress Notes (Signed)
Progress Note  Patient Name: [name] Date of Encounter: 06/23/2016  Primary Cardiologist: Harl Bowie  Subjective   Feeling about 50% of the way better today.  Inpatient Medications    Scheduled Meds: . atorvastatin  40 mg Oral q1800  . azithromycin  500 mg Intravenous Q24H  . carvedilol  25 mg Oral BID WC  . ceFEPime (MAXIPIME) IV  2 g Intravenous Q8H  . ferrous sulfate  325 mg Oral BID WC  . furosemide  40 mg Intravenous Daily  . insulin aspart  0-20 Units Subcutaneous TID WC  . insulin aspart  10 Units Subcutaneous TID WC  . insulin glargine  15 Units Subcutaneous QHS  . ipratropium-albuterol  3 mL Nebulization BID  . levothyroxine  100 mcg Oral QAC breakfast  . methylPREDNISolone (SOLU-MEDROL) injection  40 mg Intravenous Q12H  . polyethylene glycol  17 g Oral Daily  . senna-docusate  2 tablet Oral BID   Continuous Infusions:  PRN Meds: albuterol, guaiFENesin-dextromethorphan, naphazoline-glycerin, oxyCODONE-acetaminophen, spironolactone, traMADol   Vital Signs    Vitals:   06/22/16 2006 06/22/16 2144 06/23/16 0527 06/23/16 0902  BP:  (!) 111/53 126/64   Pulse:  (!) 52 (!) 57   Resp:  18 18   Temp:  98.4 F (36.9 C) 97.7 F (36.5 C)   TempSrc:  Oral Oral   SpO2: 98% 100% 100% 99%  Weight:   (!) 315 lb 11.2 oz (143.2 kg)   Height:        Intake/Output Summary (Last 24 hours) at 06/23/16 1232 Last data filed at 06/23/16 1054  Gross per 24 hour  Intake             1610 ml  Output             2200 ml  Net             -590 ml   Filed Weights   06/20/16 1654 06/22/16 0500 06/23/16 0527  Weight: (!) 328 lb 7.8 oz (149 kg) (!) 324 lb 11.8 oz (147.3 kg) (!) 315 lb 11.2 oz (143.2 kg)    Telemetry    N/A - Personally Reviewed  Physical Exam   GEN: No acute distress. Morbidly obese HEENT: Normocephalic, atraumatic, sclera non-icteric. Neck: No JVD or bruits. Cardiac: RRR no murmurs, rubs, or gallops.  Radials/DP/PT 1+ and equal bilaterally.    Respiratory: coarse BS bilaterally. Breathing is unlabored. GI: Soft, nontender, non-distended, BS +x 4. MS: no deformity. Extremities: No clubbing or cyanosis. No edema. Distal pedal pulses are 2+ and equal bilaterally. Neuro:  AAOx3. Follows commands. Psych:  Responds to questions appropriately with a normal affect.  Labs    Chemistry Recent Labs Lab 06/21/16 0518 06/22/16 0546 06/23/16 0510  NA 134* 134* 134*  K 4.5 4.3 4.5  CL 102 99* 98*  CO2 21* 23 24  GLUCOSE 203* 195* 189*  BUN 34* 43* 43*  CREATININE 1.52* 1.54* 1.35*  CALCIUM 7.6* 7.5* 7.7*  PROT  --   --  6.6  ALBUMIN  --   --  3.0*  AST  --   --  16  ALT  --   --  45  ALKPHOS  --   --  98  BILITOT  --   --  0.4  GFRNONAA 34* 33* 39*  GFRAA 39* 39* 45*  ANIONGAP '11 12 12     '$ Hematology Recent Labs Lab 06/21/16 0518 06/22/16 0546 06/23/16 0510  WBC 0.4* 0.6* 1.0*  RBC 3.89 3.98 4.11  HGB 9.9* 10.1* 10.4*  HCT 30.5* 31.1* 32.1*  MCV 78.4 78.1 78.1  MCH 25.4* 25.4* 25.3*  MCHC 32.5 32.5 32.4  RDW 18.9* 18.8* 18.8*  PLT 99* 130* 109*    Cardiac EnzymesNo results for input(s): TROPONINI in the last 168 hours. No results for input(s): TROPIPOC in the last 168 hours.   BNP Recent Labs Lab 06/19/16 1322  BNP 199.0*     DDimer No results for input(s): DDIMER in the last 168 hours.   Radiology    Dg Chest Port 1 View  Result Date: 06/23/2016 CLINICAL DATA:  Community acquired pneumonia . EXAM: PORTABLE CHEST 1 VIEW COMPARISON:  06/19/2016 . FINDINGS: Port-A-Cath noted in stable position. Cardiomegaly with stable mild pulmonary vascular congestion and interstitial prominence. No change from prior exam. IMPRESSION: 1.  Port-A-Cath noted in stable position. 2. Cardiomegaly with mild pulmonary vascular prominence and interstitial prominence again noted without interim change. Mild CHF cannot be excluded. No focal infiltrate noted. Electronically Signed   By: Marcello Moores  Register   On: 06/23/2016 08:05     Cardiac Studies   Echo 06/20/16 Study Conclusions  - Left ventricle: The cavity size was moderately dilated. Systolic   function was moderately to severely reduced. The estimated   ejection fraction was in the range of 30% to 35%. Diffuse   hypokinesis. Doppler parameters are consistent with abnormal left   ventricular relaxation (grade 1 diastolic dysfunction). - Left atrium: The atrium was mildly dilated.  Impressions:  - EF is mildly reduced when compared to prior.  Patient Profile     71 y.o. female with Multiple myeloma undergoing treatment with Xgeva and Darzalex weekly, NICM, chronic systolic CHF, morbid obesity, OSA, anemia, DM, GERD, HTN, CKD III. She previously had ICD but had troubles with lead dislodgment and inappropriate shocks in the past and device was removed. EF had improved to 40-45% in 2015 therefore she no longer required this. Admitted 06/19/16 with worsening SOB in the setting of recent neutropenia - felt to have acute bronchitis in addition to acute on chronic systolic CHF. F/u echo 06/20/16: EF 30-35%, grade 1 DD.  Assessment & Plan    1. Acute respiratory failure felt multifactorial secondary to possible acute on chronic bronchitis and acute on chronic systolic heart failure - volume status challenging to assess given severe obesity. She has been responding thus far to IV diuretics - down 14 lbs so far. Dr. Johnsie Cancel recommended to continue until Cr closer to 1.8-2.0. Her baseline appears 1.5 however. It's not clear that change in EF clinically significant as she has had moderate reduction in EF for years. Can revisit ischemic workup as OP once clincially improved. Renal function stable today. Would continue IV Lasix today and follow Cr and CO. Consider addition of nitrates/low dose hydralazine for afterload reduction. Could also consider changing Coreg to Toprol for improved beta selectivity.  2. Multiple myeloma with neutropenia- per primary team. Her low albumin  has also likely contributed to volume rentention.  3. CKD stage III - being monitored with diuresis.  4. Morbid obesity/OSA - per chart, has not been using CPAP which likely complicates her clinical status.  Signed, Victoria Pitter, PA-C  06/23/2016, 12:32 PM    The patient was seen and examined, and I agree with the history, physical exam, assessment and plan as documented above, with modifications as noted below. Pt said she is doing "much better today". Diuresed over 1.7 L in last 24 hrs on  IV Lasix 40 mg daily. Would continue for now and consider switching to oral Lasix on 4.5. Continue Coreg and spironolactone for now. BP normal. Would consider hydralazine and nitrates in outpatient setting and see if she tolerates.  Victoria Sable, MD, [facility]  06/23/2016 12:43 PM

## 2016-06-23 NOTE — Progress Notes (Signed)
SATURATION QUALIFICATIONS: (This note is used to comply with regulatory documentation for home oxygen)  Patient Saturations on Room Air at Rest = 100%  Patient Saturations on Room Air while Ambulating = 99%  

## 2016-06-24 ENCOUNTER — Inpatient Hospital Stay (HOSPITAL_COMMUNITY): Payer: Medicare Other

## 2016-06-24 DIAGNOSIS — J189 Pneumonia, unspecified organism: Secondary | ICD-10-CM

## 2016-06-24 DIAGNOSIS — J181 Lobar pneumonia, unspecified organism: Secondary | ICD-10-CM

## 2016-06-24 LAB — CBC WITH DIFFERENTIAL/PLATELET
BASOS ABS: 0 10*3/uL (ref 0.0–0.1)
Basophils Relative: 0 %
EOS PCT: 1 %
Eosinophils Absolute: 0 10*3/uL (ref 0.0–0.7)
HEMATOCRIT: 32.4 % — AB (ref 36.0–46.0)
HEMOGLOBIN: 10.5 g/dL — AB (ref 12.0–15.0)
LYMPHS PCT: 52 %
Lymphs Abs: 0.7 10*3/uL (ref 0.7–4.0)
MCH: 25.6 pg — ABNORMAL LOW (ref 26.0–34.0)
MCHC: 32.4 g/dL (ref 30.0–36.0)
MCV: 79 fL (ref 78.0–100.0)
MONOS PCT: 19 %
Monocytes Absolute: 0.2 10*3/uL (ref 0.1–1.0)
NEUTROS ABS: 0.3 10*3/uL — AB (ref 1.7–7.7)
Neutrophils Relative %: 28 %
Platelets: 107 10*3/uL — ABNORMAL LOW (ref 150–400)
RBC: 4.1 MIL/uL (ref 3.87–5.11)
RDW: 18.8 % — ABNORMAL HIGH (ref 11.5–15.5)
WBC: 1.2 10*3/uL — CL (ref 4.0–10.5)

## 2016-06-24 LAB — CULTURE, BLOOD (ROUTINE X 2)
Culture: NO GROWTH
Culture: NO GROWTH

## 2016-06-24 LAB — BASIC METABOLIC PANEL
ANION GAP: 10 (ref 5–15)
BUN: 43 mg/dL — ABNORMAL HIGH (ref 6–20)
CHLORIDE: 99 mmol/L — AB (ref 101–111)
CO2: 26 mmol/L (ref 22–32)
Calcium: 7.6 mg/dL — ABNORMAL LOW (ref 8.9–10.3)
Creatinine, Ser: 1.34 mg/dL — ABNORMAL HIGH (ref 0.44–1.00)
GFR calc Af Amer: 46 mL/min — ABNORMAL LOW (ref >60)
GFR calc non Af Amer: 39 mL/min — ABNORMAL LOW (ref >60)
GLUCOSE: 109 mg/dL — AB (ref 65–99)
POTASSIUM: 4.1 mmol/L (ref 3.5–5.1)
Sodium: 135 mmol/L (ref 135–145)

## 2016-06-24 LAB — GLUCOSE, CAPILLARY
GLUCOSE-CAPILLARY: 178 mg/dL — AB (ref 65–99)
Glucose-Capillary: 96 mg/dL (ref 65–99)

## 2016-06-24 MED ORDER — METHYLPREDNISOLONE 4 MG PO TBPK: ORAL_TABLET | ORAL | 0 refills | Status: DC

## 2016-06-24 MED ORDER — LEVOFLOXACIN 750 MG PO TABS: 750.0000 mg | ORAL_TABLET | Freq: Every day | ORAL | 0 refills | Status: DC

## 2016-06-24 MED ORDER — POMALIDOMIDE 4 MG PO CAPS: 4.0000 mg | ORAL_CAPSULE | Freq: Every day | ORAL | Status: DC

## 2016-06-24 MED ORDER — HEPARIN SOD (PORK) LOCK FLUSH 100 UNIT/ML IV SOLN
500.0000 [IU] | INTRAVENOUS | Status: AC | PRN
  Administered 2016-06-24: 500 [IU]
  Filled 2016-06-24: qty 5

## 2016-06-24 MED ORDER — FUROSEMIDE 40 MG PO TABS: 40.0000 mg | ORAL_TABLET | Freq: Two times a day (BID) | ORAL | 0 refills | Status: DC

## 2016-06-24 NOTE — Discharge Summary (Signed)
[name] UVO:[id number] DOB: 1946/01/18 DOA: 06/19/2016  PCP: Maggie Font, MD  Admit date: 06/19/2016  Discharge date: 06/24/2016  Admitted From: Home   Disposition:  Home   Recommendations for Outpatient Follow-up:   Follow up with PCP in 1-2 weeks  PCP Please obtain BMP/CBC, 2 view CXR in 1week,  (see Discharge instructions)   PCP Please follow up on the following pending results: Monitor BMP and CBC   Home Health: None  Equipment/Devices: None  Consultations: Cards Discharge Condition: Fair   CODE STATUS: Full   Diet Recommendation: Diet heart healthy/carb modified Fluid restriction: 1500 mL Fluid/day     Chief Complaint  Patient presents with  . Shortness of Breath     Brief history of present illness from the day of admission and additional interim summary    Victoria D Lipscombis a 71 y.o.femalewith a history of multiple myeloma currently undergoing treatment with Xgeva and Darzalex weekly, systolic heart failure with an EF of 40% on echocardiogram in 2016, thrombocytopenia, neutropenia, GERD, morbid obesity, hypertension, diabetes type 2 on insulin, hypothyroidism,  Multiple myeloma, presents with a week history of sob, was found to be diffusely wheezing and productive cough. She was admitted for further evaluation.                                                                  Hospital Course    Acute respiratory failure secondary to possible acute on chronic bronchitis and acute on chronic systolic heart failure EF 30-40% :  Was stableon telemetry, Was seen by cardiology and diuresed with IV Lasix with good effect, she is now symptom-free, no oxygen demand, 100% on room air, worked with PT and was cleared for home discharge.   Bronchitis is much better, she has no wheezing, no  productive cough, she received IV steroids and Levaquin while she was here, chest x-ray had no infiltrate hence pneumonia was ruled out, she will be placed on Medrol Dosepak along with 3 more days of oral Levaquin. She will follow with her PCP in 3-4 days.  Request PCP to check CBC, BMP and a 2 view chest x-ray next visit. These make sure patient follows with her oncologist promptly.   Hypothyroidism: resumed synthroid.   Multiple Myeloma, with pancytopenia and neutropenia:  Her Pomalyst on hold for now after previous MD discussed the case with her oncologist Dr Tressie Stalker, she has been asked to follow with her oncologist within the next 5-7 days.  Stage 3 CKD: Creatinine at baseline.    Diabetes mellitus 2: Continue Home regimen   Discharge diagnosis     Active Problems:   Essential hypertension   CKD (chronic kidney disease) stage 3, GFR 30-59 ml/min   Chronic systolic congestive heart failure (HCC)   Diabetes mellitus with complication (  [place])   Multiple myeloma (HCC)   Chronic kidney disease (CKD), stage III (moderate)   Acute bronchitis   SOB (shortness of breath)   Neutropenia ([place])   Community acquired pneumonia of left lower lobe of lung [facility])    Discharge instructions    Discharge Instructions    Discharge instructions    Complete by:  As directed    Follow with Primary MD Maggie Font, MD in 7 days   Get CBC, CMP, 2 view Chest X ray checked  by Primary MD or SNF MD in 5-7 days ( we routinely change or add medications that can affect your baseline labs and fluid status, therefore we recommend that you get the mentioned basic workup next visit with your PCP, your PCP may decide not to get them or add new tests based on their clinical decision)  Activity: As tolerated with Full fall precautions use walker/cane & assistance as needed  Disposition Home    Diet:   Diet heart healthy/carb modified Fluid restriction: 1500 mL Fluid/day   For Heart failure patients -  Check your Weight same time everyday, if you gain over 2 pounds, or you develop in leg swelling, experience more shortness of breath or chest pain, call your Primary MD immediately. Follow Cardiac Low Salt Diet and 1.5 lit/day fluid restriction.  On your next visit with your primary care physician please Get Medicines reviewed and adjusted.  Please request your Prim.MD to go over all Hospital Tests and Procedure/Radiological results at the follow up, please get all Hospital records sent to your Prim MD by signing hospital release before you go home.  If you experience worsening of your admission symptoms, develop shortness of breath, life threatening emergency, suicidal or homicidal thoughts you must seek medical attention immediately by calling 911 or calling your MD immediately  if symptoms less severe.  You Must read complete instructions/literature along with all the possible adverse reactions/side effects for all the Medicines you take and that have been prescribed to you. Take any new Medicines after you have completely understood and accpet all the possible adverse reactions/side effects.   Do not drive, operate heavy machinery, perform activities at heights, swimming or participation in water activities or provide baby sitting services if your were admitted for syncope or siezures until you have seen by Primary MD or a Neurologist and advised to do so again.  Do not drive when taking Pain medications.    Do not take more than prescribed Pain, Sleep and Anxiety Medications  Special Instructions: If you have smoked or chewed Tobacco  in the last 2 yrs please stop smoking, stop any regular Alcohol  and or any Recreational drug use.  Wear Seat belts while driving.   Please note  You were cared for by a hospitalist during your hospital stay. If you have any questions about your discharge medications or the care you received while you were in the hospital after you are discharged, you can  call the unit and asked to speak with the hospitalist on call if the hospitalist that took care of you is not available. Once you are discharged, your primary care physician will handle any further medical issues. Please note that NO REFILLS for any discharge medications will be authorized once you are discharged, as it is imperative that you return to your primary care physician (or establish a relationship with a primary care physician if you do not have one) for your aftercare needs so that they can reassess  your need for medications and monitor your lab values.   Increase activity slowly    Complete by:  As directed       Discharge Medications   Allergies as of 06/24/2016      Reactions   Doxycycline Rash      Medication List    STOP taking these medications   sulfamethoxazole-trimethoprim 800-160 MG tablet Commonly known as:  BACTRIM DS,SEPTRA DS     TAKE these medications   aspirin EC 81 MG tablet Take 81 mg by mouth every evening.   atorvastatin 40 MG tablet Commonly known as:  LIPITOR Take 40 mg by mouth daily.   carvedilol 25 MG tablet Commonly known as:  COREG Take 25 mg by mouth 2 (two) times daily with a meal.   CINNAMON PO Take 2 capsules by mouth every morning.   DARATUMUMAB IV Inject into the vein every Thursday.   EYE DROPS 0.05 % ophthalmic solution Generic drug:  tetrahydrozoline Place 1 drop into both eyes daily as needed. For dry eye relief   ferrous sulfate 325 (65 FE) MG tablet Take 325 mg by mouth daily.   FLAX SEED OIL PO Take 1 capsule by mouth every morning.   furosemide 40 MG tablet Commonly known as:  LASIX Take 1 tablet (40 mg total) by mouth 2 (two) times daily. For fluid retention What changed:  when to take this   gabapentin 300 MG capsule Commonly known as:  NEURONTIN Take 300 mg by mouth daily as needed (for neuropathy pain/Leg pain).   HUMALOG KWIKPEN 100 UNIT/ML KiwkPen Generic drug:  insulin lispro Take 20-25 Units by mouth  3 (three) times daily. As directed per sliding scale. To take 25 units upon taking Steroid regimen prior to scheduled treatments   levofloxacin 750 MG tablet Commonly known as:  LEVAQUIN Take 1 tablet (750 mg total) by mouth daily at 6 PM.   levothyroxine 100 MCG tablet Commonly known as:  SYNTHROID, LEVOTHROID Take 100 mcg by mouth daily before breakfast.   lidocaine-prilocaine cream Commonly known as:  EMLA Apply 1 application topically as needed (for port access).   LORazepam 1 MG tablet Commonly known as:  ATIVAN Take 1 mg by mouth every 8 (eight) hours as needed (for nausea).   methylPREDNISolone 4 MG Tbpk tablet Commonly known as:  MEDROL DOSEPAK follow package directions   montelukast 10 MG tablet Commonly known as:  SINGULAIR Take 10 mg by mouth See admin instructions. Take one tablet on the day before, the day of, and the day after treatments. Treatments are taken on Thursdays of each week   ondansetron 8 MG tablet Commonly known as:  ZOFRAN Take 8 mg by mouth every 8 (eight) hours as needed for nausea or vomiting.   pomalidomide 4 MG capsule Commonly known as:  POMALYST Take 1 capsule (4 mg total) by mouth daily. Take with water on days 1-21. Repeat every 28 days.   Resume only after you are asked to do so by your oncologist. What changed:  additional instructions   prochlorperazine 10 MG tablet Commonly known as:  COMPAZINE Take 10 mg by mouth every 6 (six) hours as needed for nausea or vomiting.   spironolactone 25 MG tablet Commonly known as:  ALDACTONE Take 25 mg by mouth daily.       Follow-up Information    Maggie Font, MD. Schedule an appointment as soon as possible for a visit in 1 week(s).   Specialty:  Family Medicine Why:  and your Oncologist within 1 week Contact information: [address] [place] [address] [phone]        Carlyle Dolly, MD. Schedule an appointment as soon as possible for a visit in 1 week(s).     Specialty:  Cardiology Why:  CHF Contact information: [address] [place] [address] [phone]           Major procedures and Radiology Reports - PLEASE review detailed and final reports thoroughly  -         Dg Chest 2 View  Result Date: 06/24/2016 CLINICAL DATA:  Cough and shortness of breath for 1 week. History of congestive heart failure and multiple myeloma. EXAM: CHEST  2 VIEW COMPARISON:  Single-view of the chest 06/23/2016 and 06/19/2016. FINDINGS: Port-A-Cath is again seen. There is cardiomegaly without edema. Mild subsegmental atelectasis is noted lung bases. No pneumothorax or effusion. IMPRESSION: Cardiomegaly without edema. Subsegmental atelectasis in the lung bases Electronically Signed   By: Inge Rise M.D.   On: 06/24/2016 08:50   Dg Chest 2 View  Result Date: 06/19/2016 CLINICAL DATA:  SOB, Pt c/o SOB x 2 weeks, productive yellow cough, pain when breathing HISTORY OF CHF, DM, CANCER, HTN, MULTIPLE MYELOMA, PNEUMONIA EXAM: CHEST - 2 VIEW COMPARISON:  none FINDINGS: Right IJ port catheter to the mid SVC. No pneumothorax. Coarse perihilar and bibasilar interstitial opacities. Suspect mild central vascular congestion. No pleural effusion. Moderate cardiomegaly.  Atheromatous aorta. Visualized bones unremarkable. IMPRESSION: 1. Cardiomegaly with vascular congestion and coarse perihilar interstitial opacities of uncertain chronicity. Electronically Signed   By: Lucrezia Europe M.D.   On: 06/19/2016 14:11   Dg Chest Port 1 View  Result Date: 06/23/2016 CLINICAL DATA:  Community acquired pneumonia . EXAM: PORTABLE CHEST 1 VIEW COMPARISON:  06/19/2016 . FINDINGS: Port-A-Cath noted in stable position. Cardiomegaly with stable mild pulmonary vascular congestion and interstitial prominence. No change from prior exam. IMPRESSION: 1.  Port-A-Cath noted in stable position. 2. Cardiomegaly with mild pulmonary vascular prominence and interstitial prominence again noted without  interim change. Mild CHF cannot be excluded. No focal infiltrate noted. Electronically Signed   By: Marcello Moores  Register   On: 06/23/2016 08:05    Micro Results     Recent Results (from the past 240 hour(s))  Culture, blood (routine x 2)     Status: None   Collection Time: 06/19/16  1:57 PM  Result Value Ref Range Status   Specimen Description BLOOD LEFT HAND  Final   Special Requests BOTTLES DRAWN AEROBIC AND ANAEROBIC 4CC EACH  Final   Culture NO GROWTH 5 DAYS  Final   Report Status 06/24/2016 FINAL  Final  Culture, sputum-assessment     Status: None   Collection Time: 06/19/16  5:15 PM  Result Value Ref Range Status   Specimen Description SPUTUM  Final   Special Requests NONE  Final   Sputum evaluation   Final    Sputum specimen not acceptable for testing.  Please recollect.   SPOKE WITH CIVENS,L @ [phone] ON 4.1.18 TO PLEASE RECOLLECT BY BAYSE,L   Report Status 06/20/2016 FINAL  Final  Culture, blood (routine x 2)     Status: None   Collection Time: 06/19/16  5:56 PM  Result Value Ref Range Status   Specimen Description BLOOD RIGHT HAND  Final   Special Requests BOTTLES DRAWN AEROBIC AND ANAEROBIC [place]  Final   Culture NO GROWTH 5 DAYS  Final   Report Status 06/24/2016 FINAL  Final  MRSA PCR Screening     Status: None   Collection Time: 06/19/16  6:17 PM  Result Value Ref Range Status   MRSA by PCR NEGATIVE NEGATIVE Final    Comment:        The GeneXpert MRSA Assay (FDA approved for NASAL specimens only), is one component of a comprehensive MRSA colonization surveillance program. It is not intended to diagnose MRSA infection nor to guide or monitor treatment for MRSA infections.     Today   Subjective    [name] today has no headache,no chest abdominal pain,no new weakness tingling or numbness, feels much better wants to go home today.    Objective   Blood pressure (!) 124/58, pulse 63, temperature 97.6 F (36.4 C), temperature source Oral, resp.  rate 17, height '5\' 4"'$  (1.626 m), weight (!) 142.7 kg (314 lb 11.2 oz), SpO2 100 %.   Intake/Output Summary (Last 24 hours) at 06/24/16 1003 Last data filed at 06/24/16 0500  Gross per 24 hour  Intake              480 ml  Output             2500 ml  Net            -2020 ml    Exam Awake Alert, Oriented x 3, No new F.N deficits, Normal affect [place].AT,PERRAL Supple Neck,No JVD, No cervical lymphadenopathy appriciated.  Symmetrical Chest wall movement, Good air movement bilaterally, CTAB RRR,No Gallops,Rubs or new Murmurs, No Parasternal Heave +ve B.Sounds, Abd Soft, Non tender, No organomegaly appriciated, No rebound -guarding or rigidity. No Cyanosis, Clubbing or edema, No new Rash or bruise   Data Review   CBC w Diff: Lab Results  Component Value Date   WBC 1.2 (LL) 06/24/2016   HGB 10.5 (L) 06/24/2016   HCT 32.4 (L) 06/24/2016   PLT 107 (L) 06/24/2016   LYMPHOPCT 52 06/24/2016   MONOPCT 19 06/24/2016   EOSPCT 1 06/24/2016   BASOPCT 0 06/24/2016    CMP: Lab Results  Component Value Date   NA 135 06/24/2016   K 4.1 06/24/2016   CL 99 (L) 06/24/2016   CO2 26 06/24/2016   BUN 43 (H) 06/24/2016   CREATININE 1.34 (H) 06/24/2016   CREATININE 1.29 (H) 08/01/2013   PROT 6.6 06/23/2016   ALBUMIN 3.0 (L) 06/23/2016   BILITOT 0.4 06/23/2016   ALKPHOS 98 06/23/2016   AST 16 06/23/2016   ALT 45 06/23/2016  .   Total Time in preparing paper work, data evaluation and todays exam - 75 minutes  Lala Lund M.D on 06/24/2016 at 10:03 AM  [facility]   Office  [phone]

## 2016-06-24 NOTE — Care Management Important Message (Signed)
Important Message  Patient Details  Name: [name] MRN: [id number] Date of Birth: [birth year]   Medicare Important Message Given:  Yes    Sherald Barge, RN 06/24/2016, 10:10 AM

## 2016-06-24 NOTE — Care Management Note (Signed)
Case Management Note  Patient Details  Name: AZRA ABRELL MRN: [id number] Date of Birth: [birth year]   Expected Discharge Date:  06/24/16               Expected Discharge Plan:  Home/Self Care  In-House Referral:  NA  Discharge planning Services  CM Consult  Post Acute Care Choice:  NA Choice offered to:  NA  Status of Service:  Completed, signed off  If discussed at [place] of Stay Meetings, dates discussed:  06/24/2016  Additional Comments: Pt discharging home today with self care.  Sherald Barge, RN 06/24/2016, 10:09 AM

## 2016-06-24 NOTE — Progress Notes (Signed)
Progress Note  Patient Name: [name] Date of Encounter: 06/24/2016  Primary Cardiologist: Harl Bowie  Subjective   Better still with mild bronchitic cought   Inpatient Medications    Scheduled Meds: . atorvastatin  40 mg Oral q1800  . carvedilol  25 mg Oral BID WC  . ferrous sulfate  325 mg Oral BID WC  . furosemide  40 mg Intravenous Daily  . insulin aspart  0-20 Units Subcutaneous TID WC  . insulin aspart  10 Units Subcutaneous TID WC  . insulin glargine  15 Units Subcutaneous QHS  . ipratropium-albuterol  3 mL Nebulization BID  . levofloxacin  750 mg Oral q1800  . levothyroxine  100 mcg Oral QAC breakfast  . methylPREDNISolone (SOLU-MEDROL) injection  40 mg Intravenous Daily  . polyethylene glycol  17 g Oral Daily  . senna-docusate  2 tablet Oral BID   Continuous Infusions:  PRN Meds: albuterol, guaiFENesin-dextromethorphan, naphazoline-glycerin, oxyCODONE-acetaminophen, spironolactone, traMADol   Vital Signs    Vitals:   06/23/16 1427 06/23/16 2026 06/23/16 2116 06/24/16 0528  BP: (!) 99/55  (!) 106/46 (!) 124/58  Pulse: (!) 56  68 63  Resp: '18  20 17  '$ Temp: 98 F (36.7 C)  98.2 F (36.8 C) 97.6 F (36.4 C)  TempSrc: Oral  Oral Oral  SpO2: 95% 96% 100%   Weight:    (!) 314 lb 11.2 oz (142.7 kg)  Height:        Intake/Output Summary (Last 24 hours) at 06/24/16 0849 Last data filed at 06/24/16 0500  Gross per 24 hour  Intake              720 ml  Output             2500 ml  Net            -1780 ml   Filed Weights   06/22/16 0500 06/23/16 0527 06/24/16 0528  Weight: (!) 324 lb 11.8 oz (147.3 kg) (!) 315 lb 11.2 oz (143.2 kg) (!) 314 lb 11.2 oz (142.7 kg)    Telemetry    N/A - Personally Reviewed  Physical Exam   BP (!) 124/58 (BP Location: Right Arm)   Pulse 63   Temp 97.6 F (36.4 C) (Oral)   Resp 17   Ht '5\' 4"'$  (1.626 m)   Wt (!) 314 lb 11.2 oz (142.7 kg)   SpO2 100%   BMI 54.02 kg/m  Affect appropriate Obese black female    HEENT: normal Neck supple with no adenopathy JVP normal no bruits no thyromegaly Lungs clear with no wheezing and good diaphragmatic motion Heart:  S1/S2 no murmur, no rub, gallop or click old AICD scar under left clavicle  PMI normal Abdomen: benighn, BS positve, no tenderness, no AAA no bruit.  No HSM or HJR Distal pulses intact with no bruits Trace LE edema Neuro non-focal Skin warm and dry No muscular weakness   Labs    Chemistry  Recent Labs Lab 06/22/16 0546 06/23/16 0510 06/24/16 0543  NA 134* 134* 135  K 4.3 4.5 4.1  CL 99* 98* 99*  CO2 '23 24 26  '$ GLUCOSE 195* 189* 109*  BUN 43* 43* 43*  CREATININE 1.54* 1.35* 1.34*  CALCIUM 7.5* 7.7* 7.6*  PROT  --  6.6  --   ALBUMIN  --  3.0*  --   AST  --  16  --   ALT  --  45  --   ALKPHOS  --  98  --   BILITOT  --  0.4  --   GFRNONAA 33* 39* 39*  GFRAA 39* 45* 46*  ANIONGAP '12 12 10     '$ Hematology  Recent Labs Lab 06/22/16 0546 06/23/16 0510 06/24/16 0543  WBC 0.6* 1.0* 1.2*  RBC 3.98 4.11 4.10  HGB 10.1* 10.4* 10.5*  HCT 31.1* 32.1* 32.4*  MCV 78.1 78.1 79.0  MCH 25.4* 25.3* 25.6*  MCHC 32.5 32.4 32.4  RDW 18.8* 18.8* 18.8*  PLT 130* 109* 107*    Cardiac EnzymesNo results for input(s): TROPONINI in the last 168 hours. No results for input(s): TROPIPOC in the last 168 hours.   BNP  Recent Labs Lab 06/19/16 1322  BNP 199.0*     DDimer No results for input(s): DDIMER in the last 168 hours.   Radiology    Dg Chest Port 1 View  Result Date: 06/23/2016 CLINICAL DATA:  Community acquired pneumonia . EXAM: PORTABLE CHEST 1 VIEW COMPARISON:  06/19/2016 . FINDINGS: Port-A-Cath noted in stable position. Cardiomegaly with stable mild pulmonary vascular congestion and interstitial prominence. No change from prior exam. IMPRESSION: 1.  Port-A-Cath noted in stable position. 2. Cardiomegaly with mild pulmonary vascular prominence and interstitial prominence again noted without interim change. Mild CHF cannot  be excluded. No focal infiltrate noted. Electronically Signed   By: Marcello Moores  Register   On: 06/23/2016 08:05    Cardiac Studies   Echo 06/20/16 Study Conclusions  - Left ventricle: The cavity size was moderately dilated. Systolic   function was moderately to severely reduced. The estimated   ejection fraction was in the range of 30% to 35%. Diffuse   hypokinesis. Doppler parameters are consistent with abnormal left   ventricular relaxation (grade 1 diastolic dysfunction). - Left atrium: The atrium was mildly dilated.  Impressions:  - EF is mildly reduced when compared to prior.  Patient Profile     71 y.o. female with Multiple myeloma undergoing treatment with Xgeva and Darzalex weekly, NICM, chronic systolic CHF, morbid obesity, OSA, anemia, DM, GERD, HTN, CKD III. She previously had ICD but had troubles with lead dislodgment and inappropriate shocks in the past and device was removed. EF had improved to 40-45% in 2015 therefore she no longer required this. Admitted 06/19/16 with worsening SOB in the setting of recent neutropenia - felt to have acute bronchitis in addition to acute on chronic systolic CHF. F/u echo 06/20/16: EF 30-35%, grade 1 DD.  Assessment & Plan    1. Acute respiratory failure felt multifactorial with OSA, URI, and systolic CHF. Has had excellent diuresis and is euvolemic For her currently D/C on lasix 40 am and 20 in PM  And Imdur 30 mg will arrange outpatient f/u   2. Multiple myeloma with neutropenia- per primary team. Her low albumin has also likely contributed to volume rentention. WBC improving  3. CKD stage III - being monitored with diuresis.CR actually improving  Lab Results  Component Value Date   CREATININE 1.34 (H) 06/24/2016   BUN 43 (H) 06/24/2016   NA 135 06/24/2016   K 4.1 06/24/2016   CL 99 (L) 06/24/2016   CO2 26 06/24/2016     4. Morbid obesity/OSA - per chart, has not been using CPAP which likely complicates her clinical  status.  Signed, Jenkins Rouge, MD  06/24/2016, 8:49 AM

## 2016-06-24 NOTE — Discharge Instructions (Signed)
Follow with Primary MD Maggie Font, MD in 7 days   Get CBC, CMP, 2 view Chest X ray checked  by Primary MD or SNF MD in 5-7 days ( we routinely change or add medications that can affect your baseline labs and fluid status, therefore we recommend that you get the mentioned basic workup next visit with your PCP, your PCP may decide not to get them or add new tests based on their clinical decision)  Activity: As tolerated with Full fall precautions use walker/cane & assistance as needed  Disposition Home    Diet:   Diet heart healthy/carb modified Fluid restriction: 1500 mL Fluid/day   For Heart failure patients - Check your Weight same time everyday, if you gain over 2 pounds, or you develop in leg swelling, experience more shortness of breath or chest pain, call your Primary MD immediately. Follow Cardiac Low Salt Diet and 1.5 lit/day fluid restriction.  On your next visit with your primary care physician please Get Medicines reviewed and adjusted.  Please request your Prim.MD to go over all Hospital Tests and Procedure/Radiological results at the follow up, please get all Hospital records sent to your Prim MD by signing hospital release before you go home.  If you experience worsening of your admission symptoms, develop shortness of breath, life threatening emergency, suicidal or homicidal thoughts you must seek medical attention immediately by calling 911 or calling your MD immediately  if symptoms less severe.  You Must read complete instructions/literature along with all the possible adverse reactions/side effects for all the Medicines you take and that have been prescribed to you. Take any new Medicines after you have completely understood and accpet all the possible adverse reactions/side effects.   Do not drive, operate heavy machinery, perform activities at heights, swimming or participation in water activities or provide baby sitting services if your were admitted for syncope or  siezures until you have seen by Primary MD or a Neurologist and advised to do so again.  Do not drive when taking Pain medications.    Do not take more than prescribed Pain, Sleep and Anxiety Medications  Special Instructions: If you have smoked or chewed Tobacco  in the last 2 yrs please stop smoking, stop any regular Alcohol  and or any Recreational drug use.  Wear Seat belts while driving.   Please note  You were cared for by a hospitalist during your hospital stay. If you have any questions about your discharge medications or the care you received while you were in the hospital after you are discharged, you can call the unit and asked to speak with the hospitalist on call if the hospitalist that took care of you is not available. Once you are discharged, your primary care physician will handle any further medical issues. Please note that NO REFILLS for any discharge medications will be authorized once you are discharged, as it is imperative that you return to your primary care physician (or establish a relationship with a primary care physician if you do not have one) for your aftercare needs so that they can reassess your need for medications and monitor your lab values.

## 2016-06-30 DIAGNOSIS — N183 Chronic kidney disease, stage 3 (moderate): Secondary | ICD-10-CM | POA: Diagnosis not present

## 2016-06-30 DIAGNOSIS — I5022 Chronic systolic (congestive) heart failure: Secondary | ICD-10-CM | POA: Diagnosis not present

## 2016-06-30 DIAGNOSIS — D631 Anemia in chronic kidney disease: Secondary | ICD-10-CM | POA: Diagnosis not present

## 2016-06-30 DIAGNOSIS — E1122 Type 2 diabetes mellitus with diabetic chronic kidney disease: Secondary | ICD-10-CM | POA: Diagnosis not present

## 2016-07-01 DIAGNOSIS — C9000 Multiple myeloma not having achieved remission: Secondary | ICD-10-CM | POA: Diagnosis not present

## 2016-07-01 DIAGNOSIS — D472 Monoclonal gammopathy: Secondary | ICD-10-CM | POA: Diagnosis not present

## 2016-07-01 DIAGNOSIS — R6889 Other general symptoms and signs: Secondary | ICD-10-CM | POA: Diagnosis not present

## 2016-07-02 ENCOUNTER — Ambulatory Visit (INDEPENDENT_AMBULATORY_CARE_PROVIDER_SITE_OTHER): Payer: Medicare Other | Admitting: Adult Health

## 2016-07-02 ENCOUNTER — Encounter: Payer: Self-pay | Admitting: Adult Health

## 2016-07-02 VITALS — BP 108/60 | HR 76 | Ht 64.0 in | Wt 311.0 lb

## 2016-07-02 DIAGNOSIS — I43 Cardiomyopathy in diseases classified elsewhere: Secondary | ICD-10-CM | POA: Diagnosis not present

## 2016-07-02 DIAGNOSIS — I10 Essential (primary) hypertension: Secondary | ICD-10-CM | POA: Diagnosis not present

## 2016-07-02 DIAGNOSIS — I5042 Chronic combined systolic (congestive) and diastolic (congestive) heart failure: Secondary | ICD-10-CM | POA: Diagnosis not present

## 2016-07-02 MED ORDER — FUROSEMIDE 20 MG PO TABS: ORAL_TABLET | ORAL | 3 refills | Status: DC

## 2016-07-02 MED ORDER — SPIRONOLACTONE 25 MG PO TABS: 12.5000 mg | ORAL_TABLET | Freq: Every day | ORAL | 3 refills | Status: DC

## 2016-07-02 NOTE — Progress Notes (Signed)
Cardiology Office Note   Date:  07/02/2016   ID:  [name], DOB 10-31-1945, MRN [id number]  PCP:  Maggie Font, MD  Cardiologist:  Cloria Spring, NP   Chief Complaint  Patient presents with  . Congestive Heart Failure  . Hypertension      History of Present Illness: [name] is a 71 y.o. female who presents for posthospitalization follow-up after admission for acute on chronic systolic heart failure, hypertension, with history of chronic kidney disease stage III, diabetes, and acute bronchitis. She is also been diagnosed with multiple myeloma and is being followed by Dr. Lyda Jester and Dr.  Melba Coon.The patient was seen on consultation, after echocardiogram revealed EF of 30-35%. The patient was given IV diuresis.  On discharge, the patient was continued on by mouth Levaquin for bronchitis, placed on a Medrol Dosepak, and will require a CBC and BMP and a two-view chest x-ray when seen by primary care. She was released to skilled nursing facility.  She is here today feeling about the same. She states that she has not regained any strength. She has been seen by her oncologist who felt that her kidney function had worsened and has taken her off her Lasix for 2 days and increased her fluid intake. She has not compliant with a low-sodium diet. She denies chest pain, dyspnea,  or edema  Past Medical History:  Diagnosis Date  . Anemia    takes Ferrous Fumarate 2 times week  . Arthritis    ankles  . Blood transfusion    hx of-60yr ago  . Cardiomyopathy secondary   . Cataract    bilateral and immature  . CHF (congestive heart failure) ([facility]    takes Lasix prn, Echo 2015 with EF 40%  . Chronic back pain   . Diabetes mellitus    takes Glipizide and Metformin daily  . Dysrhythmia    pt takes Digoxin and Carvedilol daily  . GERD (gastroesophageal reflux disease)    takes Protonix seldom  . Gout    hx of x 1 time in Aug 2012  . H/O hiatal hernia   .  Hemorrhoid   . Hypertension    takes Diovan and Spirololactone daily  . Left leg pain   . Multiple myeloma (H[facility])    IgA myeloma; [facility] . Overweight(278.02)   . Peripheral neuropathy ([facility]   . Pneumonia    hx of;many yrs ago  . Sleep apnea    doesn't use CPAP  . Systolic heart failure    Chronic  . Thyroid nodule    right    Past Surgical History:  Procedure Laterality Date  . ABDOMINAL HYSTERECTOMY  1991  . APPENDECTOMY  1991  . CARDIAC DEFIBRILLATOR PLACEMENT  >310yrago  . Cardiac defirillator removed     removed <27m57monthemoved d/t shocking pt 15times;pierced sac of heart 1000cc ;drained  . CHOLECYSTECTOMY OPEN  1992  . COLONOSCOPY    . COLONOSCOPY N/A 02/08/2014   slf: 1. NO SOURCE FOR ANEMIA IDENTIFIED. 2. SINGLE polyp removed 3. Moderate divertculosis in the sigmoid colon and descencing colon. 4. Moderate sized internal hemorrhoids.   . dMarland Kitchenfibrillator removed    . ESOPHAGOGASTRODUODENOSCOPY N/A 02/08/2014   SLF: No source for anemia identified 2. Black foreign body in cardia 3. Mild erosive gastriitis (inflammaion0 was found in the gastric antrum; multiple biopsies were performed.   . leg lipoma    . ovarian cyst removed  1991  .  stomach stapling surgery  80's  . THYROID LOBECTOMY  07/27/2011   Procedure: THYROID LOBECTOMY;  Surgeon: Gayland Curry, MD,FACS;  Location: [facility];  Service: General;  Laterality: Right;  . TUBAL LIGATION  1971  . tummy tuck  1991     Current Outpatient Prescriptions  Medication Sig Dispense Refill  . aspirin EC 81 MG tablet Take 81 mg by mouth every evening.    [name] atorvastatin (LIPITOR) 40 MG tablet Take 40 mg by mouth daily.     . carvedilol (COREG) 25 MG tablet Take 25 mg by mouth 2 (two) times daily with a meal.    . CINNAMON PO Take 2 capsules by mouth every morning.     [name] DARATUMUMAB IV Inject into the vein every Thursday.    . ferrous sulfate 325 (65 FE) MG tablet Take 325 mg by mouth daily.     .  Flaxseed, Linseed, (FLAX SEED OIL PO) Take 1 capsule by mouth every morning.     . gabapentin (NEURONTIN) 300 MG capsule Take 300 mg by mouth daily as needed (for neuropathy pain/Leg pain).     [name] HUMALOG KWIKPEN 100 UNIT/ML KiwkPen Take 20-25 Units by mouth 3 (three) times daily. As directed per sliding scale. To take 25 units upon taking Steroid regimen prior to scheduled treatments    . levothyroxine (SYNTHROID, LEVOTHROID) 100 MCG tablet Take 100 mcg by mouth daily before breakfast.    . lidocaine-prilocaine (EMLA) cream Apply 1 application topically as needed (for port access).     . montelukast (SINGULAIR) 10 MG tablet Take 10 mg by mouth See admin instructions. Take one tablet on the day before, the day of, and the day after treatments. Treatments are taken on Thursdays of each week    . prochlorperazine (COMPAZINE) 10 MG tablet Take 10 mg by mouth every 6 (six) hours as needed for nausea or vomiting.     [name] tetrahydrozoline (EYE DROPS) 0.05 % ophthalmic solution Place 1 drop into both eyes daily as needed. For dry eye relief    . furosemide (LASIX) 20 MG tablet Take 40 mg In the Morning and 20 mg In the Evening 135 tablet 3  . spironolactone (ALDACTONE) 25 MG tablet Take 0.5 tablets (12.5 mg total) by mouth daily. 45 tablet 3   No current facility-administered medications for this visit.     Allergies:   Doxycycline    Social History:  The patient  reports that she has never smoked. She has never used smokeless tobacco. She reports that she drinks alcohol. She reports that she does not use drugs.   Family History:  The patient's family history includes Heart failure in her other; Kidney failure in her mother.    ROS: All other systems are reviewed and negative. Unless otherwise mentioned in H&P    PHYSICAL EXAM: VS:  BP 108/60   Pulse 76   Ht '5\' 4"'$  (1.626 m)   Wt (!) 311 lb (141.1 kg)   SpO2 97%   BMI 53.38 kg/m  , BMI Body mass index is 53.38 kg/m. GEN: Well nourished, well  developed, in no acute distress Morbidly obese HEENT: normal  Neck: no JVD, carotid bruits, or masses Cardiac: RRR; no murmurs, rubs, or gallops,no edema  Respiratory:  clear to auscultation bilaterally, normal work of breathing GI: soft, nontender, nondistended, + BS MS: no deformity or atrophy  Skin: warm and dry, no rash Neuro:  Strength and sensation are intact Psych: euthymic mood, flat affect  Recent Labs: 06/19/2016: B Natriuretic Peptide 199.0 06/20/2016: TSH 0.644 06/23/2016: ALT 45 06/24/2016: BUN 43; Creatinine, Ser 1.34; Hemoglobin 10.5; Platelets 107; Potassium 4.1; Sodium 135    Lipid Panel    Component Value Date/Time   CHOL 137 10/13/2015 1036   TRIG 83 10/13/2015 1036   HDL 65 10/13/2015 1036   CHOLHDL 2.1 10/13/2015 1036   VLDL 17 10/13/2015 1036   LDLCALC 55 10/13/2015 1036      Wt Readings from Last 3 Encounters:  07/02/16 (!) 311 lb (141.1 kg)  06/24/16 (!) 314 lb 11.2 oz (142.7 kg)  04/15/16 (!) 325 lb (147.4 kg)      Other studies Reviewed: Echocardiogram: 06/20/2016  - Left ventricle: The cavity size was moderately dilated. Systolic   function was moderately to severely reduced. The estimated   ejection fraction was in the range of 30% to 35%. Diffuse   hypokinesis. Doppler parameters are consistent with abnormal left   ventricular relaxation (grade 1 diastolic dysfunction). - Left atrium: The atrium was mildly dilated.  Impressions:  - EF is mildly reduced when compared to prior.  ASSESSMENT AND PLAN:  1. Chronic mixed CHF: Patient has been taken off of Lasix for 2 days by oncology, stating that her kidney function had worsened. I'm concerned about this that she is not compliant with low sodium diet consistently. I do not see any edema currently, lungs are essentially clear. I've advised her to start back on Lasix 40 mg in the a.m. and 20 mg in the p.m. tomorrow. We'll reduce spironolactone to 12.5 mg daily. She is to continue daily weights and  salt restriction. Continue carvedilol, enalapril. With reduced ejection fraction amlodipine may not be the best choice. May need to consider change to hydralazine on next office visit.   She receives labs through oncology on Thursdays. I've asked for a copy of a BMET prior to next appointment.  2. Hypertension: Blood pressure is optimal at this time for reduced ejection fraction. No further changes unless listed above.  3. Morbid obesity: Weight loss and increased exercise to reduce weight will be held all for overall health, hypertension, and cardiac disease.       Current medicines are reviewed at length with the patient today.    Labs/ tests ordered today include:  No orders of the defined types were placed in this encounter.    Disposition:   FU with  one month   Signed, Jory Sims, NP  07/02/2016 12:42 PM    [facility] [address] [place], [place] [address] Phone: [phone]; Fax: [phone]

## 2016-07-02 NOTE — Patient Instructions (Signed)
Your physician recommends that you schedule a follow-up appointment in: [facility] has recommended you make the following change in your medication:  Lasix 40 mg in the Morning and 20 mg in the Evening Spironolactone 12.5 mg Daily   If you need a refill on your cardiac medications before your next appointment, please call your pharmacy.  Thank you for choosing [facility]!

## 2016-07-02 NOTE — Progress Notes (Signed)
Name: [name]    DOB: 28-Jul-1945  Age: 71 y.o.  MR#: [id number]       PCP:  Maggie Font, MD      Insurance: Payor: Onnie Boer MEDICARE / Plan: [facility] MEDICARE / Product Type: *No Product type* /   CC:   No chief complaint on file.   VS Vitals:   07/02/16 1206  Pulse: 76  SpO2: 97%  Weight: (!) 311 lb (141.1 kg)  Height: '5\' 4"'$  (1.626 m)    Weights Current Weight  07/02/16 (!) 311 lb (141.1 kg)  06/24/16 (!) 314 lb 11.2 oz (142.7 kg)  04/15/16 (!) 325 lb (147.4 kg)    Blood Pressure  BP Readings from Last 3 Encounters:  06/24/16 (!) 124/58  04/15/16 118/66  10/13/15 140/74     Admit date:  (Not on file) Last encounter with RMR:  Visit date not found   Allergy Doxycycline  Current Outpatient Prescriptions  Medication Sig Dispense Refill  . aspirin EC 81 MG tablet Take 81 mg by mouth every evening.    [name] atorvastatin (LIPITOR) 40 MG tablet Take 40 mg by mouth daily.     . carvedilol (COREG) 25 MG tablet Take 25 mg by mouth 2 (two) times daily with a meal.    . CINNAMON PO Take 2 capsules by mouth every morning.     [name] DARATUMUMAB IV Inject into the vein every Thursday.    . ferrous sulfate 325 (65 FE) MG tablet Take 325 mg by mouth daily.     . Flaxseed, Linseed, (FLAX SEED OIL PO) Take 1 capsule by mouth every morning.     . furosemide (LASIX) 40 MG tablet Take 1 tablet (40 mg total) by mouth 2 (two) times daily. For fluid retention 60 tablet 0  . gabapentin (NEURONTIN) 300 MG capsule Take 300 mg by mouth daily as needed (for neuropathy pain/Leg pain).     [name] HUMALOG KWIKPEN 100 UNIT/ML KiwkPen Take 20-25 Units by mouth 3 (three) times daily. As directed per sliding scale. To take 25 units upon taking Steroid regimen prior to scheduled treatments    . levothyroxine (SYNTHROID, LEVOTHROID) 100 MCG tablet Take 100 mcg by mouth daily before breakfast.    . lidocaine-prilocaine (EMLA) cream Apply 1 application topically as needed (for port access).     .  montelukast (SINGULAIR) 10 MG tablet Take 10 mg by mouth See admin instructions. Take one tablet on the day before, the day of, and the day after treatments. Treatments are taken on Thursdays of each week    . prochlorperazine (COMPAZINE) 10 MG tablet Take 10 mg by mouth every 6 (six) hours as needed for nausea or vomiting.     [name] spironolactone (ALDACTONE) 25 MG tablet Take 25 mg by mouth daily.     [name] tetrahydrozoline (EYE DROPS) 0.05 % ophthalmic solution Place 1 drop into both eyes daily as needed. For dry eye relief     No current facility-administered medications for this visit.     Discontinued Meds:    Medications Discontinued During This Encounter  Medication Reason  . pomalidomide (POMALYST) 4 MG capsule Error  . ondansetron (ZOFRAN) 8 MG tablet Error  . methylPREDNISolone (MEDROL DOSEPAK) 4 MG TBPK tablet Error  . LORazepam (ATIVAN) 1 MG tablet Error  . levofloxacin (LEVAQUIN) 750 MG tablet Error    Patient Active Problem List   Diagnosis Date Noted  . Community acquired pneumonia of left lower lobe of lung ([place])   .  Acute bronchitis 06/19/2016  . SOB (shortness of breath) 06/19/2016  . Neutropenia ([place]) 06/19/2016  . Disease of thyroid gland 07/25/2015  . Personal history of other medical treatment 07/25/2015  . 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 Anemia associated with chemotherapy 11/26/2014  . Hematoma 10/23/2014  . Hypotension 09/17/2014  . Chronic systolic congestive heart failure ([place]) 09/17/2014  . AKI (acute kidney injury) ([place]) 09/17/2014  . Diabetes mellitus with complication ([place]) 36/64/4034  . HLD (hyperlipidemia) 09/17/2014  . Multiple myeloma ([place]) 09/17/2014  . Chest pain 09/17/2014  . Elevated troponin   . Fall at home   . Thrombocytopenia ([place])   . Encounter for antineoplastic chemotherapy 09/04/2014  . Post menopausal syndrome 08/07/2014  . Acquired pancytopenia ([place]) 07/30/2014  . 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  Arthritis, degenerative 06/16/2014  . Diabetes mellitus ([place]) 06/16/2014  . Gastro-esophageal reflux disease without esophagitis 06/16/2014  . H/O deep venous thrombosis 06/16/2014  . H/O pneumothorax 06/16/2014  . Personal history of other diseases of the circulatory system 06/16/2014  . BP (high blood pressure) 06/16/2014  . Obstructive apnea 06/16/2014  . History of appendectomy 06/16/2014  . History of cholecystectomy 06/16/2014  . History of surgical procedure 06/16/2014  . Bariatric surgery status 06/16/2014  . H/O: hysterectomy 06/16/2014  . H/O tubal ligation 06/16/2014  . 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 Chronic congestive heart failure ([place]) 05/01/2014  . IgA myeloma ([place]) 05/01/2014  . Abnormal presence of protein in urine 03/27/2014  . Impaired renal function 03/27/2014  . Stiffness of joint, ankle and foot 01/09/2014  . Stiffness of joint of left pelvic region and thigh 01/09/2014  . Stiffness of joint of right pelvic region and thigh 01/09/2014  . Pain in joint, pelvic region and thigh 01/09/2014  . Muscle weakness (generalized) 01/09/2014  . Pain in joint, ankle and foot 01/09/2014  . Acute on chronic combined systolic and diastolic heart failure ([place]) 10/25/2013  . Microcytic anemia 10/25/2013  . CKD (chronic kidney disease) stage 3, GFR 30-59 ml/min 10/25/2013  . Chronic kidney disease (CKD), stage III (moderate) 10/25/2013  . Pleuritic chest pain 10/24/2013  . S/P partial thyroidectomy -Right 07/28/2011  . History of partial thyroidectomy 07/28/2011  . GASTROPARESIS 06/02/2010  . PALPITATIONS, OCCASIONAL 04/23/2010  . DIABETES MELLITUS 09/18/2008  . Morbid obesity ([place]) 09/18/2008  . Essential hypertension 09/18/2008  . CARDIOMYOPATHY, SECONDARY 09/18/2008  . SYSTOLIC HEART FAILURE, CHRONIC 09/18/2008    LABS    Component Value Date/Time   NA 135 06/24/2016 0543   NA 134 (L) 06/23/2016 0510   NA 134 (L) 06/22/2016 0546   K 4.1  06/24/2016 0543   K 4.5 06/23/2016 0510   K 4.3 06/22/2016 0546   CL 99 (L) 06/24/2016 0543   CL 98 (L) 06/23/2016 0510   CL 99 (L) 06/22/2016 0546   CO2 26 06/24/2016 0543   CO2 24 06/23/2016 0510   CO2 23 06/22/2016 0546   GLUCOSE 109 (H) 06/24/2016 0543   GLUCOSE 189 (H) 06/23/2016 0510   GLUCOSE 195 (H) 06/22/2016 0546   BUN 43 (H) 06/24/2016 0543   BUN 43 (H) 06/23/2016 0510   BUN 43 (H) 06/22/2016 0546   CREATININE 1.34 (H) 06/24/2016 0543   CREATININE 1.35 (H) 06/23/2016 0510   CREATININE 1.54 (H) 06/22/2016 0546   CREATININE 1.29 (H) 08/01/2013 0001   CREATININE 1.56 (H) 02/26/2013 0925   CREATININE 1.63 (H) 02/16/2013 1152   CALCIUM 7.6 (L) 06/24/2016 0543  CALCIUM 7.7 (L) 06/23/2016 0510   CALCIUM 7.5 (L) 06/22/2016 0546   GFRNONAA 39 (L) 06/24/2016 0543   GFRNONAA 39 (L) 06/23/2016 0510   GFRNONAA 33 (L) 06/22/2016 0546   GFRAA 46 (L) 06/24/2016 0543   GFRAA 45 (L) 06/23/2016 0510   GFRAA 39 (L) 06/22/2016 0546   CMP     Component Value Date/Time   NA 135 06/24/2016 0543   K 4.1 06/24/2016 0543   CL 99 (L) 06/24/2016 0543   CO2 26 06/24/2016 0543   GLUCOSE 109 (H) 06/24/2016 0543   BUN 43 (H) 06/24/2016 0543   CREATININE 1.34 (H) 06/24/2016 0543   CREATININE 1.29 (H) 08/01/2013 0001   CALCIUM 7.6 (L) 06/24/2016 0543   PROT 6.6 06/23/2016 0510   ALBUMIN 3.0 (L) 06/23/2016 0510   AST 16 06/23/2016 0510   ALT 45 06/23/2016 0510   ALKPHOS 98 06/23/2016 0510   BILITOT 0.4 06/23/2016 0510   GFRNONAA 39 (L) 06/24/2016 0543   GFRAA 46 (L) 06/24/2016 0543       Component Value Date/Time   WBC 1.2 (LL) 06/24/2016 0543   WBC 1.0 (LL) 06/23/2016 0510   WBC 0.6 (LL) 06/22/2016 0546   HGB 10.5 (L) 06/24/2016 0543   HGB 10.4 (L) 06/23/2016 0510   HGB 10.1 (L) 06/22/2016 0546   HCT 32.4 (L) 06/24/2016 0543   HCT 32.1 (L) 06/23/2016 0510   HCT 31.1 (L) 06/22/2016 0546   MCV 79.0 06/24/2016 0543   MCV 78.1 06/23/2016 0510   MCV 78.1 06/22/2016 0546     Lipid Panel     Component Value Date/Time   CHOL 137 10/13/2015 1036   TRIG 83 10/13/2015 1036   HDL 65 10/13/2015 1036   CHOLHDL 2.1 10/13/2015 1036   VLDL 17 10/13/2015 1036   LDLCALC 55 10/13/2015 1036    ABG    Component Value Date/Time   HCO3 23.3 09/24/2006 1130   TCO2 26 12/03/2007 1922     Lab Results  Component Value Date   TSH 0.644 06/20/2016   BNP (last 3 results)  Recent Labs  06/19/16 1322  BNP 199.0*    ProBNP (last 3 results) No results for input(s): PROBNP in the last 8760 hours.  Cardiac Panel (last 3 results) No results for input(s): CKTOTAL, CKMB, TROPONINI, RELINDX in the last 72 hours.  Iron/TIBC/Ferritin/ %Sat    Component Value Date/Time   IRON 32 (L) 12/04/2007 1410   TIBC 284 12/04/2007 1410   FERRITIN 152 12/11/2013 0902   IRONPCTSAT 11 (L) 12/04/2007 1410     EKG Orders placed or performed during the hospital encounter of 06/19/16  . EKG 12-Lead  . EKG 12-Lead  . ED EKG  . ED EKG  . EKG     Prior Assessment and Plan Problem List as of 07/02/2016 Reviewed: 04/30/2016  9:21 AM by Gardiner Barefoot, DPM     Cardiovascular and Mediastinum   Essential hypertension   Last Assessment & Plan 07/11/2013 Office Visit Written 07/11/2013  1:56 PM by Imogene Burn, PA-C    Blood pressure controlled      CARDIOMYOPATHY, SECONDARY   Last Assessment & Plan 07/11/2013 Office Visit Edited 07/11/2013  1:58 PM by Imogene Burn, PA-C    Patient has no evidence of heart failure. She had recent labs by Dr. Berdine Addison so I will not repeat them.      SYSTOLIC HEART FAILURE, CHRONIC   Last Assessment & Plan 02/26/2013 Office Visit Written 02/26/2013  9:11  AM by Evans Lance, MD    Her blood pressure has run a bit low and I have asked the patient to reduce her dose of coreg to 12.5 mg twice daily.       Acute on chronic combined systolic and diastolic heart failure (HCC)   Hypotension   Chronic systolic congestive heart failure (HCC)   Chronic  congestive heart failure (HCC)   BP (high blood pressure)     Respiratory   Obstructive apnea   Acute bronchitis   Community acquired pneumonia of left lower lobe of lung (HCC)     Digestive   GASTROPARESIS   Gastro-esophageal reflux disease without esophagitis     Endocrine   DIABETES MELLITUS   Diabetes mellitus with complication (HCC)   Nodular adrenal cortex (HCC)   Diabetes mellitus ([place])   Disease of thyroid gland     Nervous and Auditory   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)   Chronic kidney disease (CKD), stage III (moderate)   Impaired renal function     Hematopoietic and Hemostatic   Acquired pancytopenia ([place])     Other   Morbid obesity [facility])   Last Assessment & Plan 02/26/2013 Office Visit Written 02/26/2013  9:12 AM by Evans Lance, MD    I have asked the patient to lose 10 lbs when I see her back.       Microcytic anemia   Last Assessment & Plan 12/06/2013 Office Visit Written 12/06/2013  9:18 AM by Danie Binder, MD    NO S/SX OF ACTIVE GI BLEED. MOST LIKELY DUE TO GASTRITIS IN SETTING OF CRI.  TCS/EGD WITHIN 2 WEEKS NEPHROLOGY REFERRAL FOR ANEMIA/CRI CHECK FERRITIN TODAY      PALPITATIONS, OCCASIONAL   Last Assessment & Plan 07/11/2013 Office Visit Written 07/11/2013  1:56 PM by Imogene Burn, PA-C    Patient has had some dizziness with sensation of her heart stopping. Some of her dizziness could be related to her anemia but with her cardiomyopathy recommend 30 day event recorder. Followup with Dr. [name] in July      S/P partial thyroidectomy -Right   Pleuritic chest pain   Stiffness of joint, ankle and foot   Pain in joint, pelvic region and thigh   Muscle  weakness (generalized)   Pain in joint, ankle and foot   HLD (hyperlipidemia)   Multiple myeloma (HCC)   Chest pain   Elevated troponin   Fall at home   Thrombocytopenia ([place])   Anemia associated with chemotherapy   Encounter for antineoplastic chemotherapy   Hematoma   H/O deep venous thrombosis   History of partial thyroidectomy   H/O pneumothorax   Personal history of other medical treatment   Personal history of other diseases of the circulatory system   IgA myeloma ([place])   Post menopausal syndrome   Abnormal presence of protein in urine   History of appendectomy   History of cholecystectomy   History of surgical procedure   Bariatric surgery status   H/O: hysterectomy   H/O tubal ligation   Anemia, hemolytic, thalassemia minor   SOB (shortness of breath)   Neutropenia ([place])  Imaging: Dg Chest 2 View  Result Date: 06/24/2016 CLINICAL DATA:  Cough and shortness of breath for 1 week. History of congestive heart failure and multiple myeloma. EXAM: CHEST  2 VIEW COMPARISON:  Single-view of the chest 06/23/2016 and 06/19/2016. FINDINGS: Port-A-Cath is again seen. There is cardiomegaly without edema. Mild subsegmental atelectasis is noted lung bases. No pneumothorax or effusion. IMPRESSION: Cardiomegaly without edema. Subsegmental atelectasis in the lung bases Electronically Signed   By: Inge Rise M.D.   On: 06/24/2016 08:50   Dg Chest 2 View  Result Date: 06/19/2016 CLINICAL DATA:  SOB, Pt c/o SOB x 2 weeks, productive yellow cough, pain when breathing HISTORY OF CHF, DM, CANCER, HTN, MULTIPLE MYELOMA, PNEUMONIA EXAM: CHEST - 2 VIEW COMPARISON:  none FINDINGS: Right IJ port catheter to the mid SVC. No pneumothorax. Coarse perihilar and bibasilar interstitial opacities. Suspect mild central vascular congestion. No pleural effusion. Moderate cardiomegaly.  Atheromatous aorta. Visualized bones unremarkable. IMPRESSION: 1. Cardiomegaly with vascular congestion and coarse  perihilar interstitial opacities of uncertain chronicity. Electronically Signed   By: Lucrezia Europe M.D.   On: 06/19/2016 14:11   Dg Chest Port 1 View  Result Date: 06/23/2016 CLINICAL DATA:  Community acquired pneumonia . EXAM: PORTABLE CHEST 1 VIEW COMPARISON:  06/19/2016 . FINDINGS: Port-A-Cath noted in stable position. Cardiomegaly with stable mild pulmonary vascular congestion and interstitial prominence. No change from prior exam. IMPRESSION: 1.  Port-A-Cath noted in stable position. 2. Cardiomegaly with mild pulmonary vascular prominence and interstitial prominence again noted without interim change. Mild CHF cannot be excluded. No focal infiltrate noted. Electronically Signed   By: Marcello Moores  Register   On: 06/23/2016 08:05

## 2016-07-06 DIAGNOSIS — C9000 Multiple myeloma not having achieved remission: Secondary | ICD-10-CM | POA: Diagnosis not present

## 2016-07-06 DIAGNOSIS — Z5111 Encounter for antineoplastic chemotherapy: Secondary | ICD-10-CM | POA: Diagnosis not present

## 2016-07-08 DIAGNOSIS — C9000 Multiple myeloma not having achieved remission: Secondary | ICD-10-CM | POA: Diagnosis not present

## 2016-07-08 DIAGNOSIS — Z5111 Encounter for antineoplastic chemotherapy: Secondary | ICD-10-CM | POA: Diagnosis not present

## 2016-07-09 DIAGNOSIS — E113299 Type 2 diabetes mellitus with mild nonproliferative diabetic retinopathy without macular edema, unspecified eye: Secondary | ICD-10-CM | POA: Diagnosis not present

## 2016-07-09 DIAGNOSIS — R609 Edema, unspecified: Secondary | ICD-10-CM | POA: Diagnosis not present

## 2016-07-09 DIAGNOSIS — E890 Postprocedural hypothyroidism: Secondary | ICD-10-CM | POA: Diagnosis not present

## 2016-07-09 DIAGNOSIS — E1165 Type 2 diabetes mellitus with hyperglycemia: Secondary | ICD-10-CM | POA: Diagnosis not present

## 2016-07-15 DIAGNOSIS — Z5111 Encounter for antineoplastic chemotherapy: Secondary | ICD-10-CM | POA: Diagnosis not present

## 2016-07-15 DIAGNOSIS — C9000 Multiple myeloma not having achieved remission: Secondary | ICD-10-CM | POA: Diagnosis not present

## 2016-07-19 DIAGNOSIS — G4733 Obstructive sleep apnea (adult) (pediatric): Secondary | ICD-10-CM | POA: Diagnosis not present

## 2016-07-21 DIAGNOSIS — H04123 Dry eye syndrome of bilateral lacrimal glands: Secondary | ICD-10-CM | POA: Diagnosis not present

## 2016-07-22 DIAGNOSIS — T451X5A Adverse effect of antineoplastic and immunosuppressive drugs, initial encounter: Secondary | ICD-10-CM | POA: Diagnosis not present

## 2016-07-22 DIAGNOSIS — Z5111 Encounter for antineoplastic chemotherapy: Secondary | ICD-10-CM | POA: Diagnosis not present

## 2016-07-22 DIAGNOSIS — C9000 Multiple myeloma not having achieved remission: Secondary | ICD-10-CM | POA: Diagnosis not present

## 2016-07-22 DIAGNOSIS — D6481 Anemia due to antineoplastic chemotherapy: Secondary | ICD-10-CM | POA: Diagnosis not present

## 2016-07-26 DIAGNOSIS — Z79899 Other long term (current) drug therapy: Secondary | ICD-10-CM | POA: Diagnosis not present

## 2016-07-26 DIAGNOSIS — R809 Proteinuria, unspecified: Secondary | ICD-10-CM | POA: Diagnosis not present

## 2016-07-26 DIAGNOSIS — I10 Essential (primary) hypertension: Secondary | ICD-10-CM | POA: Diagnosis not present

## 2016-07-26 DIAGNOSIS — D509 Iron deficiency anemia, unspecified: Secondary | ICD-10-CM | POA: Diagnosis not present

## 2016-07-26 DIAGNOSIS — N183 Chronic kidney disease, stage 3 (moderate): Secondary | ICD-10-CM | POA: Diagnosis not present

## 2016-07-27 DIAGNOSIS — Z5181 Encounter for therapeutic drug level monitoring: Secondary | ICD-10-CM | POA: Diagnosis not present

## 2016-07-27 DIAGNOSIS — T451X5A Adverse effect of antineoplastic and immunosuppressive drugs, initial encounter: Secondary | ICD-10-CM | POA: Diagnosis not present

## 2016-07-27 DIAGNOSIS — Z7982 Long term (current) use of aspirin: Secondary | ICD-10-CM | POA: Diagnosis not present

## 2016-07-27 DIAGNOSIS — D6481 Anemia due to antineoplastic chemotherapy: Secondary | ICD-10-CM | POA: Diagnosis not present

## 2016-07-27 DIAGNOSIS — C9000 Multiple myeloma not having achieved remission: Secondary | ICD-10-CM | POA: Diagnosis not present

## 2016-07-27 DIAGNOSIS — Z5111 Encounter for antineoplastic chemotherapy: Secondary | ICD-10-CM | POA: Diagnosis not present

## 2016-07-29 DIAGNOSIS — T451X5A Adverse effect of antineoplastic and immunosuppressive drugs, initial encounter: Secondary | ICD-10-CM | POA: Diagnosis not present

## 2016-07-29 DIAGNOSIS — Z5111 Encounter for antineoplastic chemotherapy: Secondary | ICD-10-CM | POA: Diagnosis not present

## 2016-07-29 DIAGNOSIS — C9000 Multiple myeloma not having achieved remission: Secondary | ICD-10-CM | POA: Diagnosis not present

## 2016-07-29 DIAGNOSIS — D6481 Anemia due to antineoplastic chemotherapy: Secondary | ICD-10-CM | POA: Diagnosis not present

## 2016-07-30 ENCOUNTER — Encounter: Payer: Self-pay | Admitting: Adult Health

## 2016-07-30 ENCOUNTER — Ambulatory Visit (INDEPENDENT_AMBULATORY_CARE_PROVIDER_SITE_OTHER): Payer: Medicare Other | Admitting: Adult Health

## 2016-07-30 VITALS — BP 136/62 | HR 73 | Ht 64.0 in | Wt 313.0 lb

## 2016-07-30 DIAGNOSIS — I5022 Chronic systolic (congestive) heart failure: Secondary | ICD-10-CM | POA: Diagnosis not present

## 2016-07-30 DIAGNOSIS — I10 Essential (primary) hypertension: Secondary | ICD-10-CM | POA: Diagnosis not present

## 2016-07-30 NOTE — Patient Instructions (Signed)
Your physician wants you to follow-up in: 6 Month with Dr. Harl Bowie. You will receive a reminder letter in the mail two months in advance. If you don't receive a letter, please call our office to schedule the follow-up appointment.  Your physician recommends that you continue on your current medications as directed. Please refer to the Current Medication list given to you today.  If you need a refill on your cardiac medications before your next appointment, please call your pharmacy.  Thank you for choosing [facility]!

## 2016-07-30 NOTE — Progress Notes (Signed)
Cardiology Office Note   Date:  07/30/2016   ID:  [name], DOB Feb 17, 1946, MRN [id number]  PCP:  Iona Beard, MD  Cardiologist: Cloria Spring, NP   Chief Complaint  Patient presents with  . Congestive Heart Failure  . Hypertension   History of Present Illness: [name] is a 71 y.o. female who presents for ongoing assessment and management of chronic systolic CHF, hypertension, with hx of CKD stage III, diabetes and bronchitis, and multiple myeloma. She is followed by oncology. She was last seen on 07/02/2016 post hospitalization with continued weakness and fatigue. [name] She was decreased on lasix from 40 mg BID to 40 mg in am and 20 mg in pm, with plans to cut out pm dose if she was still symptomatic without edema. Spironolactone was reduced to 2.5 mg. She is here for follow on response to medications.   She is without complaints of dyspnea or fluid retention with changes in diuretics. She continues to follow oncology for treatment of multiple myeloma.   Past Medical History:  Diagnosis Date  . Anemia    takes Ferrous Fumarate 2 times week  . Arthritis    ankles  . Blood transfusion    hx of-68yr ago  . Cardiomyopathy secondary   . Cataract    bilateral and immature  . CHF (congestive heart failure) ([place]    takes Lasix prn, Echo 2015 with EF 40%  . Chronic back pain   . Diabetes mellitus    takes Glipizide and Metformin daily  . Dysrhythmia    pt takes Digoxin and Carvedilol daily  . GERD (gastroesophageal reflux disease)    takes Protonix seldom  . Gout    hx of x 1 time in Aug 2012  . H/O hiatal hernia   . Hemorrhoid   . Hypertension    takes Diovan and Spirololactone daily  . Left leg pain   . Multiple myeloma (HCC)    IgA myeloma; [facility] . Overweight(278.02)   . Peripheral neuropathy   . Pneumonia    hx of;many yrs ago  . Sleep apnea    doesn't use CPAP  . Systolic heart failure    Chronic  .  Thyroid nodule    right    Past Surgical History:  Procedure Laterality Date  . ABDOMINAL HYSTERECTOMY  1991  . APPENDECTOMY  1991  . CARDIAC DEFIBRILLATOR PLACEMENT  >360yrago  . Cardiac defirillator removed     removed <60m29monthemoved d/t shocking pt 15times;pierced sac of heart 1000cc ;drained  . CHOLECYSTECTOMY OPEN  1992  . COLONOSCOPY    . COLONOSCOPY N/A 02/08/2014   slf: 1. NO SOURCE FOR ANEMIA IDENTIFIED. 2. SINGLE polyp removed 3. Moderate divertculosis in the sigmoid colon and descencing colon. 4. Moderate sized internal hemorrhoids.   . dMarland Kitchenfibrillator removed    . ESOPHAGOGASTRODUODENOSCOPY N/A 02/08/2014   SLF: No source for anemia identified 2. Black foreign body in cardia 3. Mild erosive gastriitis (inflammaion0 was found in the gastric antrum; multiple biopsies were performed.   . leg lipoma    . ovarian cyst removed  1991  . stomach stapling surgery  80's  . THYROID LOBECTOMY  07/27/2011   Procedure: THYROID LOBECTOMY;  Surgeon: Victoria CurryD,FACS;  [facility];  Laterality: Right;  . TUBAL LIGATION  1971  . tummy tuck  1991     Current Outpatient Prescriptions  Medication Sig Dispense  Refill  . aspirin EC 81 MG tablet Take 81 mg by mouth every evening.    [name] atorvastatin (LIPITOR) 40 MG tablet Take 40 mg by mouth daily.     . carvedilol (COREG) 25 MG tablet Take 25 mg by mouth 2 (two) times daily with a meal.    . CINNAMON PO Take 2 capsules by mouth every morning.     [name] DARATUMUMAB IV Inject into the vein every Thursday.    . ferrous sulfate 325 (65 FE) MG tablet Take 325 mg by mouth daily.     . Flaxseed, Linseed, (FLAX SEED OIL PO) Take 1 capsule by mouth every morning.     . furosemide (LASIX) 20 MG tablet Take 40 mg In the Morning and 20 mg In the Evening 135 tablet 3  . gabapentin (NEURONTIN) 300 MG capsule Take 300 mg by mouth daily as needed (for neuropathy pain/Leg pain).     [name] HUMALOG KWIKPEN 100 UNIT/ML KiwkPen Take 20-25  Units by mouth 3 (three) times daily. As directed per sliding scale. To take 25 units upon taking Steroid regimen prior to scheduled treatments    . levothyroxine (SYNTHROID, LEVOTHROID) 100 MCG tablet Take 100 mcg by mouth daily before breakfast.    . lidocaine-prilocaine (EMLA) cream Apply 1 application topically as needed (for port access).     . montelukast (SINGULAIR) 10 MG tablet Take 10 mg by mouth See admin instructions. Take one tablet on the day before, the day of, and the day after treatments. Treatments are taken on Thursdays of each week    . prochlorperazine (COMPAZINE) 10 MG tablet Take 10 mg by mouth every 6 (six) hours as needed for nausea or vomiting.     [name] spironolactone (ALDACTONE) 25 MG tablet Take 0.5 tablets (12.5 mg total) by mouth daily. 45 tablet 3  . tetrahydrozoline (EYE DROPS) 0.05 % ophthalmic solution Place 1 drop into both eyes daily as needed. For dry eye relief     No current facility-administered medications for this visit.     Allergies:   Doxycycline    Social History:  The patient  reports that she has never smoked. She has never used smokeless tobacco. She reports that she drinks alcohol. She reports that she does not use drugs.   Family History:  The patient's family history includes Heart failure in her other; Kidney failure in her mother.    ROS: All other systems are reviewed and negative. Unless otherwise mentioned in H&P    PHYSICAL EXAM: VS:  BP 136/62   Pulse 73   Ht '5\' 4"'$  (1.626 m)   Wt (!) 313 lb (142 kg)   SpO2 97%   BMI 53.73 kg/m  , BMI Body mass index is 53.73 kg/m. GEN: Well nourished, well developed, in no acute distress  HEENT: normal  Neck: no JVD, carotid bruits, or masses Cardiac: RRR; no murmurs, rubs, or gallops,no edema  Respiratory:  clear to auscultation bilaterally, normal work of breathing GI: soft, nontender, nondistended, + BS MS: no deformity or atrophy Skin: warm and dry,  [place] rash to right forearm.   Neuro:  Strength and sensation are intact Psych: euthymic mood, full affect  Recent Labs: 06/19/2016: B Natriuretic Peptide 199.0 06/20/2016: TSH 0.644 06/23/2016: ALT 45 06/24/2016: BUN 43; Creatinine, Ser 1.34; Hemoglobin 10.5; Platelets 107; Potassium 4.1; Sodium 135    Lipid Panel    Component Value Date/Time   CHOL 137 10/13/2015 1036   TRIG 83 10/13/2015 1036  HDL 65 10/13/2015 1036   CHOLHDL 2.1 10/13/2015 1036   VLDL 17 10/13/2015 1036   LDLCALC 55 10/13/2015 1036      Wt Readings from Last 3 Encounters:  07/30/16 (!) 313 lb (142 kg)  07/02/16 (!) 311 lb (141.1 kg)  06/24/16 (!) 314 lb 11.2 oz (142.7 kg)      Other studies Reviewed:  Echocardiogram 06/20/2016 Left ventricle: The cavity size was moderately dilated. Systolic   function was moderately to severely reduced. The estimated   ejection fraction was in the range of 30% to 35%. Diffuse   hypokinesis. Doppler parameters are consistent with abnormal left   ventricular relaxation (grade 1 diastolic dysfunction). - Left atrium: The atrium was mildly dilated.  ASSESSMENT AND PLAN:  1. NICM: She is feeling better with reduced dose of lasix and spironolactone. She is to continue this regimen. If kidney function does not improve, would recommend discontinuing spironolactone. Continue coreg. Not on ACE or ARB due to CKD.   2. Hypertension: Elevated for current EF. May need to add hydralazine to regimen. She is to see nephrologist to discuss labs next week. Will follow  3. Multiple myeloma: She will continue treatments through oncology.   Current medicines are reviewed at length with the patient today.    Labs/ tests ordered today include:  No orders of the defined types were placed in this encounter.    Disposition:   FU with 6 months  Signed, Jory Sims DNP, NP, [place].   07/30/2016 3:31 PM    [facility] [address] [place], [place] [address] Phone: [phone]; Fax:  [phone]

## 2016-08-04 ENCOUNTER — Ambulatory Visit: Payer: Medicare Other | Admitting: Podiatry

## 2016-08-04 DIAGNOSIS — E1129 Type 2 diabetes mellitus with other diabetic kidney complication: Secondary | ICD-10-CM | POA: Diagnosis not present

## 2016-08-04 DIAGNOSIS — G4733 Obstructive sleep apnea (adult) (pediatric): Secondary | ICD-10-CM | POA: Diagnosis not present

## 2016-08-04 DIAGNOSIS — N183 Chronic kidney disease, stage 3 (moderate): Secondary | ICD-10-CM | POA: Diagnosis not present

## 2016-08-04 DIAGNOSIS — C9000 Multiple myeloma not having achieved remission: Secondary | ICD-10-CM | POA: Diagnosis not present

## 2016-08-04 DIAGNOSIS — I509 Heart failure, unspecified: Secondary | ICD-10-CM | POA: Diagnosis not present

## 2016-08-05 DIAGNOSIS — R6889 Other general symptoms and signs: Secondary | ICD-10-CM | POA: Diagnosis not present

## 2016-08-05 DIAGNOSIS — C9000 Multiple myeloma not having achieved remission: Secondary | ICD-10-CM | POA: Diagnosis not present

## 2016-08-05 DIAGNOSIS — D472 Monoclonal gammopathy: Secondary | ICD-10-CM | POA: Diagnosis not present

## 2016-08-12 DIAGNOSIS — M5136 Other intervertebral disc degeneration, lumbar region: Secondary | ICD-10-CM | POA: Diagnosis not present

## 2016-08-12 DIAGNOSIS — D6481 Anemia due to antineoplastic chemotherapy: Secondary | ICD-10-CM | POA: Diagnosis not present

## 2016-08-12 DIAGNOSIS — M549 Dorsalgia, unspecified: Secondary | ICD-10-CM | POA: Diagnosis not present

## 2016-08-12 DIAGNOSIS — C9000 Multiple myeloma not having achieved remission: Secondary | ICD-10-CM | POA: Diagnosis not present

## 2016-08-12 DIAGNOSIS — T451X5A Adverse effect of antineoplastic and immunosuppressive drugs, initial encounter: Secondary | ICD-10-CM | POA: Diagnosis not present

## 2016-08-12 DIAGNOSIS — M47816 Spondylosis without myelopathy or radiculopathy, lumbar region: Secondary | ICD-10-CM | POA: Diagnosis not present

## 2016-08-12 DIAGNOSIS — Z5111 Encounter for antineoplastic chemotherapy: Secondary | ICD-10-CM | POA: Diagnosis not present

## 2016-08-19 DIAGNOSIS — C9000 Multiple myeloma not having achieved remission: Secondary | ICD-10-CM | POA: Diagnosis not present

## 2016-08-19 DIAGNOSIS — T451X5A Adverse effect of antineoplastic and immunosuppressive drugs, initial encounter: Secondary | ICD-10-CM | POA: Diagnosis not present

## 2016-08-19 DIAGNOSIS — G4733 Obstructive sleep apnea (adult) (pediatric): Secondary | ICD-10-CM | POA: Diagnosis not present

## 2016-08-19 DIAGNOSIS — Z5111 Encounter for antineoplastic chemotherapy: Secondary | ICD-10-CM | POA: Diagnosis not present

## 2016-08-19 DIAGNOSIS — D6481 Anemia due to antineoplastic chemotherapy: Secondary | ICD-10-CM | POA: Diagnosis not present

## 2016-08-20 DIAGNOSIS — R609 Edema, unspecified: Secondary | ICD-10-CM | POA: Diagnosis not present

## 2016-08-20 DIAGNOSIS — E890 Postprocedural hypothyroidism: Secondary | ICD-10-CM | POA: Diagnosis not present

## 2016-08-20 DIAGNOSIS — E113299 Type 2 diabetes mellitus with mild nonproliferative diabetic retinopathy without macular edema, unspecified eye: Secondary | ICD-10-CM | POA: Diagnosis not present

## 2016-08-26 DIAGNOSIS — Z5111 Encounter for antineoplastic chemotherapy: Secondary | ICD-10-CM | POA: Diagnosis not present

## 2016-08-26 DIAGNOSIS — C9000 Multiple myeloma not having achieved remission: Secondary | ICD-10-CM | POA: Diagnosis not present

## 2016-08-27 DIAGNOSIS — Z5111 Encounter for antineoplastic chemotherapy: Secondary | ICD-10-CM | POA: Diagnosis not present

## 2016-08-27 DIAGNOSIS — C9000 Multiple myeloma not having achieved remission: Secondary | ICD-10-CM | POA: Diagnosis not present

## 2016-09-02 DIAGNOSIS — C9000 Multiple myeloma not having achieved remission: Secondary | ICD-10-CM | POA: Diagnosis not present

## 2016-09-02 DIAGNOSIS — Z5111 Encounter for antineoplastic chemotherapy: Secondary | ICD-10-CM | POA: Diagnosis not present

## 2016-09-09 DIAGNOSIS — Z5111 Encounter for antineoplastic chemotherapy: Secondary | ICD-10-CM | POA: Diagnosis not present

## 2016-09-09 DIAGNOSIS — C9000 Multiple myeloma not having achieved remission: Secondary | ICD-10-CM | POA: Diagnosis not present

## 2016-09-18 DIAGNOSIS — G4733 Obstructive sleep apnea (adult) (pediatric): Secondary | ICD-10-CM | POA: Diagnosis not present

## 2016-09-21 DIAGNOSIS — I509 Heart failure, unspecified: Secondary | ICD-10-CM | POA: Diagnosis not present

## 2016-09-21 DIAGNOSIS — D61818 Other pancytopenia: Secondary | ICD-10-CM | POA: Diagnosis not present

## 2016-09-21 DIAGNOSIS — G629 Polyneuropathy, unspecified: Secondary | ICD-10-CM | POA: Diagnosis not present

## 2016-09-21 DIAGNOSIS — Z5111 Encounter for antineoplastic chemotherapy: Secondary | ICD-10-CM | POA: Diagnosis not present

## 2016-09-21 DIAGNOSIS — C9000 Multiple myeloma not having achieved remission: Secondary | ICD-10-CM | POA: Diagnosis not present

## 2016-09-21 DIAGNOSIS — N289 Disorder of kidney and ureter, unspecified: Secondary | ICD-10-CM | POA: Diagnosis not present

## 2016-09-23 DIAGNOSIS — C9000 Multiple myeloma not having achieved remission: Secondary | ICD-10-CM | POA: Diagnosis not present

## 2016-09-23 DIAGNOSIS — D61818 Other pancytopenia: Secondary | ICD-10-CM | POA: Diagnosis not present

## 2016-09-23 DIAGNOSIS — N289 Disorder of kidney and ureter, unspecified: Secondary | ICD-10-CM | POA: Diagnosis not present

## 2016-09-23 DIAGNOSIS — Z5111 Encounter for antineoplastic chemotherapy: Secondary | ICD-10-CM | POA: Diagnosis not present

## 2016-09-23 DIAGNOSIS — G629 Polyneuropathy, unspecified: Secondary | ICD-10-CM | POA: Diagnosis not present

## 2016-09-28 DIAGNOSIS — D649 Anemia, unspecified: Secondary | ICD-10-CM | POA: Diagnosis not present

## 2016-09-28 DIAGNOSIS — E039 Hypothyroidism, unspecified: Secondary | ICD-10-CM | POA: Diagnosis not present

## 2016-09-28 DIAGNOSIS — C9000 Multiple myeloma not having achieved remission: Secondary | ICD-10-CM | POA: Diagnosis not present

## 2016-09-28 DIAGNOSIS — N183 Chronic kidney disease, stage 3 (moderate): Secondary | ICD-10-CM | POA: Diagnosis not present

## 2016-10-05 DIAGNOSIS — G4733 Obstructive sleep apnea (adult) (pediatric): Secondary | ICD-10-CM | POA: Diagnosis not present

## 2016-10-13 ENCOUNTER — Ambulatory Visit: Payer: Medicare Other | Admitting: Podiatry

## 2016-10-14 DIAGNOSIS — D61818 Other pancytopenia: Secondary | ICD-10-CM | POA: Diagnosis not present

## 2016-10-14 DIAGNOSIS — Z5111 Encounter for antineoplastic chemotherapy: Secondary | ICD-10-CM | POA: Diagnosis not present

## 2016-10-14 DIAGNOSIS — N289 Disorder of kidney and ureter, unspecified: Secondary | ICD-10-CM | POA: Diagnosis not present

## 2016-10-14 DIAGNOSIS — C9000 Multiple myeloma not having achieved remission: Secondary | ICD-10-CM | POA: Diagnosis not present

## 2016-10-14 DIAGNOSIS — G629 Polyneuropathy, unspecified: Secondary | ICD-10-CM | POA: Diagnosis not present

## 2016-10-19 DIAGNOSIS — G4733 Obstructive sleep apnea (adult) (pediatric): Secondary | ICD-10-CM | POA: Diagnosis not present

## 2016-10-21 DIAGNOSIS — R6889 Other general symptoms and signs: Secondary | ICD-10-CM | POA: Diagnosis not present

## 2016-10-21 DIAGNOSIS — C9000 Multiple myeloma not having achieved remission: Secondary | ICD-10-CM | POA: Diagnosis not present

## 2016-10-21 DIAGNOSIS — D472 Monoclonal gammopathy: Secondary | ICD-10-CM | POA: Diagnosis not present

## 2016-10-28 DIAGNOSIS — C9000 Multiple myeloma not having achieved remission: Secondary | ICD-10-CM | POA: Diagnosis not present

## 2016-10-28 DIAGNOSIS — Z5111 Encounter for antineoplastic chemotherapy: Secondary | ICD-10-CM | POA: Diagnosis not present

## 2016-11-09 DIAGNOSIS — I10 Essential (primary) hypertension: Secondary | ICD-10-CM | POA: Diagnosis not present

## 2016-11-09 DIAGNOSIS — Z0000 Encounter for general adult medical examination without abnormal findings: Secondary | ICD-10-CM | POA: Diagnosis not present

## 2016-11-09 DIAGNOSIS — E038 Other specified hypothyroidism: Secondary | ICD-10-CM | POA: Diagnosis not present

## 2016-11-09 DIAGNOSIS — C9000 Multiple myeloma not having achieved remission: Secondary | ICD-10-CM | POA: Diagnosis not present

## 2016-11-10 ENCOUNTER — Ambulatory Visit: Payer: Medicare Other | Admitting: Podiatry

## 2016-11-11 DIAGNOSIS — Z5111 Encounter for antineoplastic chemotherapy: Secondary | ICD-10-CM | POA: Diagnosis not present

## 2016-11-11 DIAGNOSIS — C9000 Multiple myeloma not having achieved remission: Secondary | ICD-10-CM | POA: Diagnosis not present

## 2016-11-19 DIAGNOSIS — G4733 Obstructive sleep apnea (adult) (pediatric): Secondary | ICD-10-CM | POA: Diagnosis not present

## 2016-11-25 DIAGNOSIS — C9000 Multiple myeloma not having achieved remission: Secondary | ICD-10-CM | POA: Diagnosis not present

## 2016-11-25 DIAGNOSIS — Z5111 Encounter for antineoplastic chemotherapy: Secondary | ICD-10-CM | POA: Diagnosis not present

## 2016-11-30 DIAGNOSIS — C9000 Multiple myeloma not having achieved remission: Secondary | ICD-10-CM | POA: Diagnosis not present

## 2016-11-30 DIAGNOSIS — M545 Low back pain: Secondary | ICD-10-CM | POA: Diagnosis not present

## 2016-11-30 DIAGNOSIS — G629 Polyneuropathy, unspecified: Secondary | ICD-10-CM | POA: Diagnosis not present

## 2016-11-30 DIAGNOSIS — G8929 Other chronic pain: Secondary | ICD-10-CM | POA: Diagnosis not present

## 2016-11-30 DIAGNOSIS — R200 Anesthesia of skin: Secondary | ICD-10-CM | POA: Diagnosis not present

## 2016-12-04 IMAGING — DX DG LUMBAR SPINE COMPLETE 4+V
5 series · 5 of 5 positions shown · non-contrast
Comparison: None.

CLINICAL DATA: Right hip and low back pain.  No known injury.

EXAM:
LUMBAR SPINE - COMPLETE 4+ VIEW

[l-spine ap]
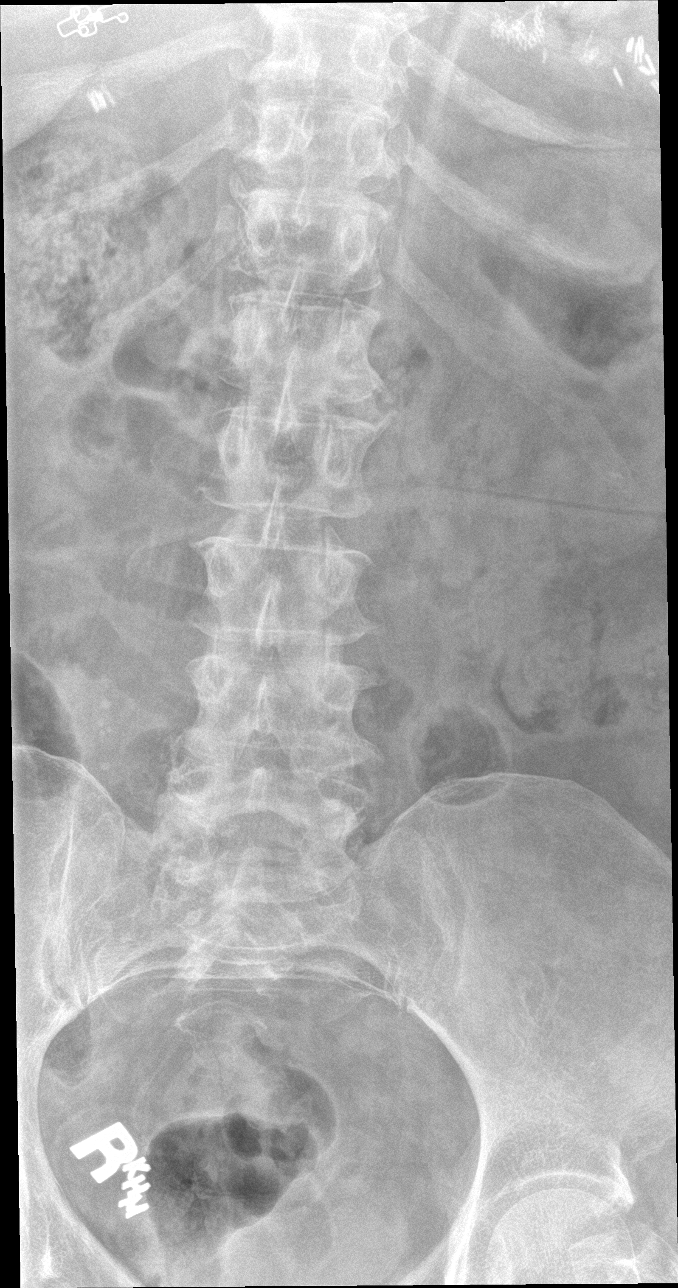

[l-spine obl (1 of 2)]
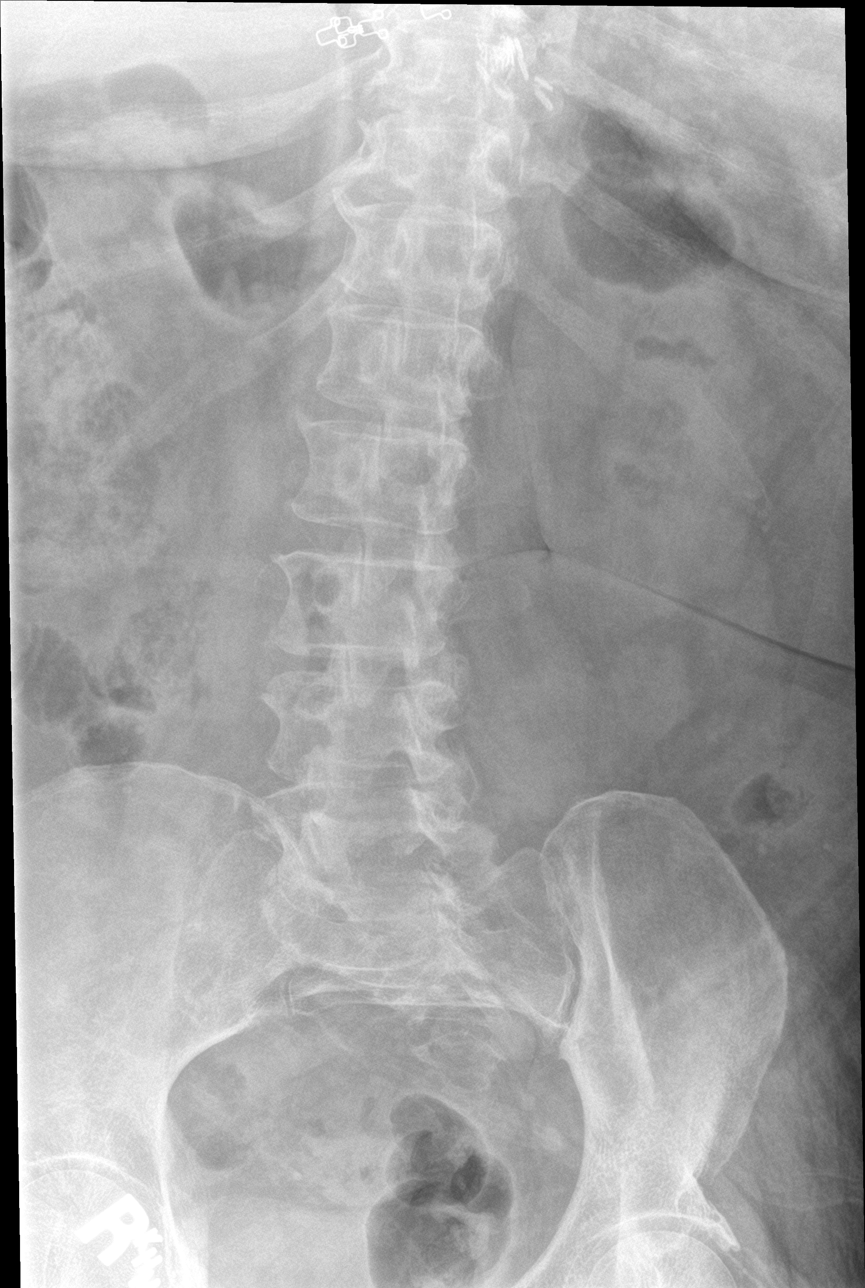

[l-spine obl (2 of 2)]
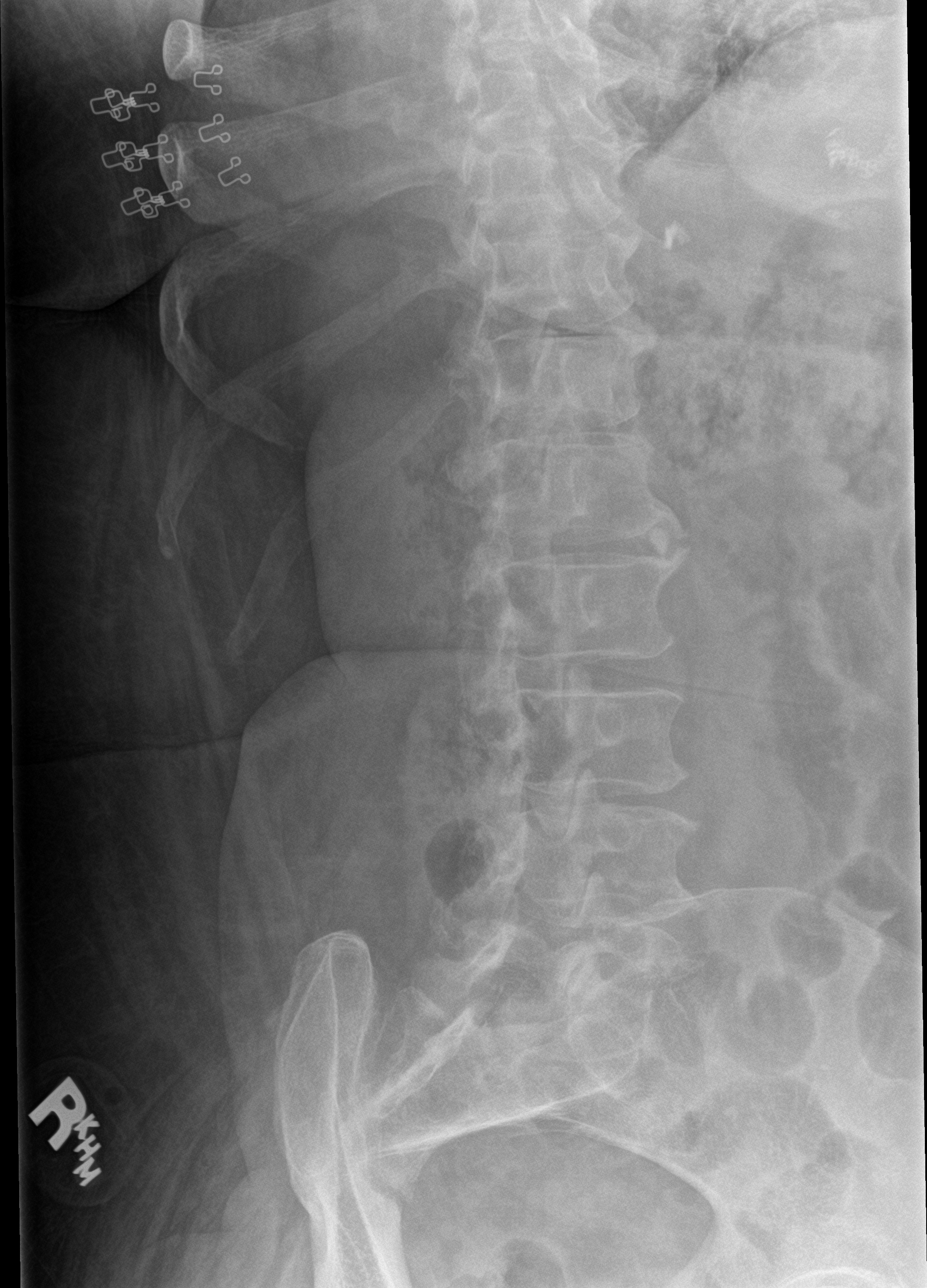

[l-spine spot]
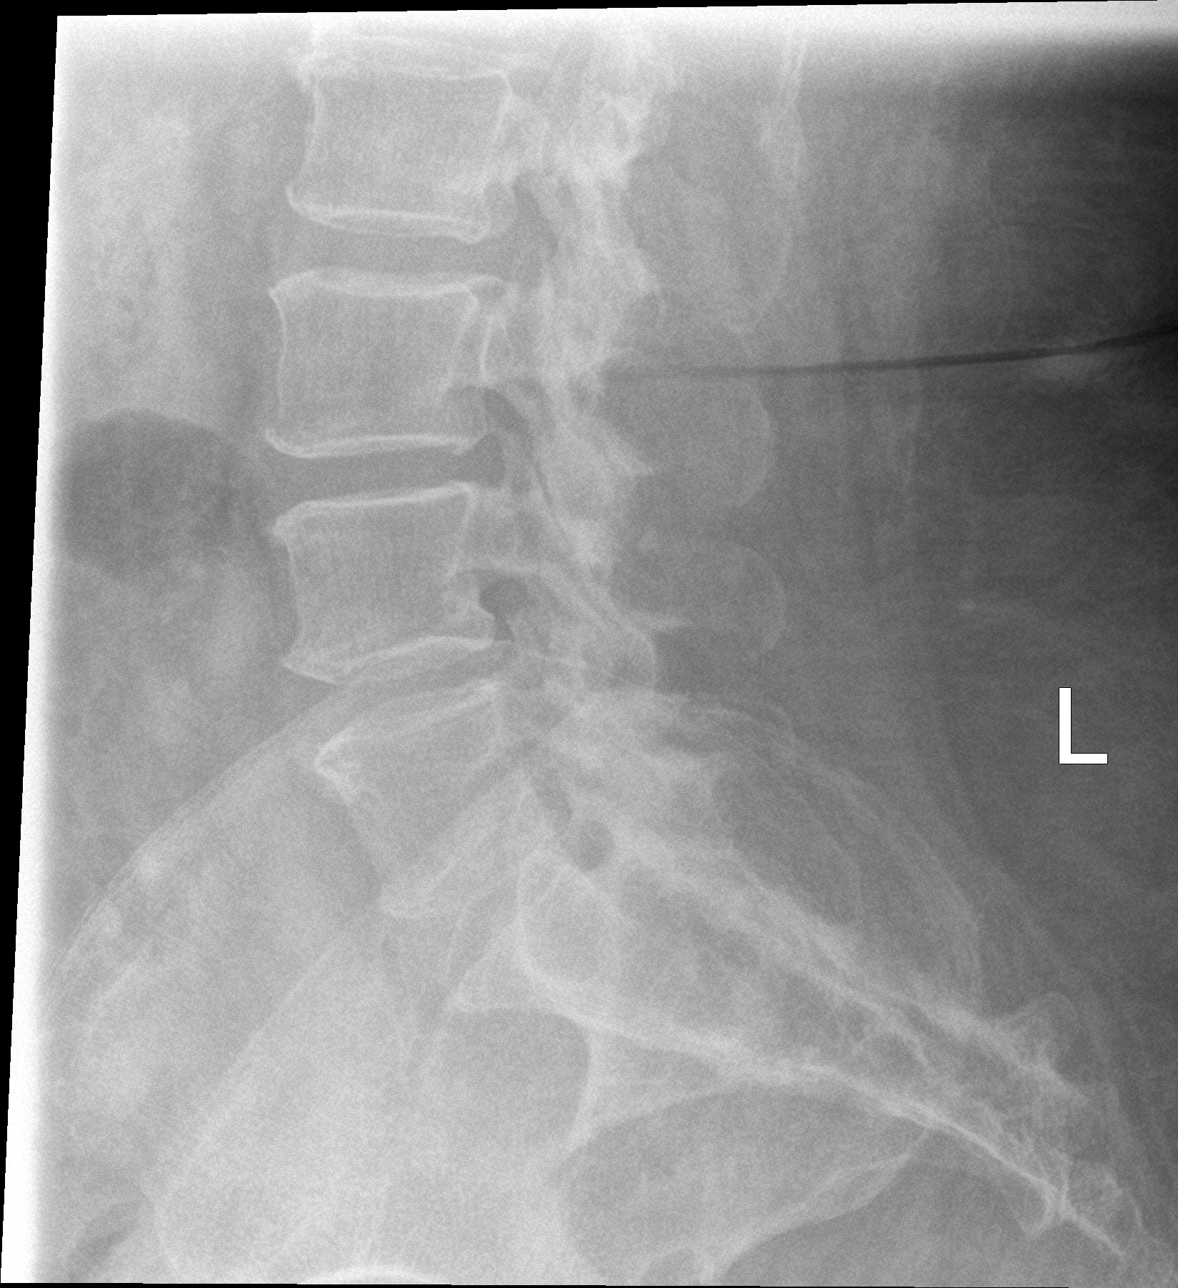

[l-spine lat]
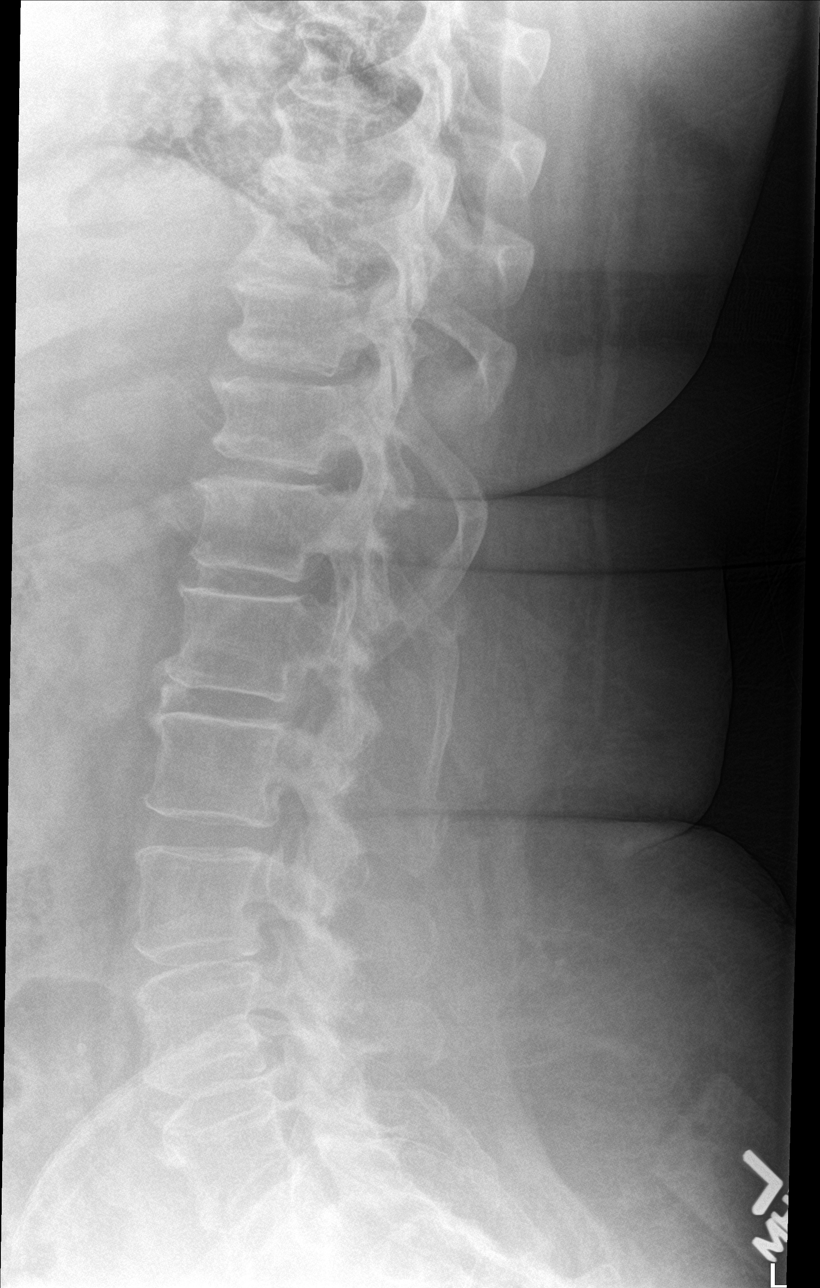

[5 of 5 positions shown; findings below may reference images not displayed]

FINDINGS: Degenerative facet disease throughout the lumbar spine. Mild
degenerative spurring anteriorly in the lumbar spine and lower
thoracic spine. Normal alignment. No fracture. No acute scratch head
SI joints are symmetric and unremarkable.
IMPRESSION: Mild degenerative facet disease, early degenerative disc disease. No
acute findings.

## 2016-12-08 ENCOUNTER — Encounter: Payer: Self-pay | Admitting: Podiatry

## 2016-12-08 ENCOUNTER — Ambulatory Visit (INDEPENDENT_AMBULATORY_CARE_PROVIDER_SITE_OTHER): Payer: Medicare Other | Admitting: Podiatry

## 2016-12-08 DIAGNOSIS — B351 Tinea unguium: Secondary | ICD-10-CM

## 2016-12-08 DIAGNOSIS — M722 Plantar fascial fibromatosis: Secondary | ICD-10-CM

## 2016-12-08 DIAGNOSIS — Q6650 Congenital pes planus, unspecified foot: Secondary | ICD-10-CM

## 2016-12-08 DIAGNOSIS — M79676 Pain in unspecified toe(s): Secondary | ICD-10-CM

## 2016-12-08 NOTE — Progress Notes (Signed)
This patient presents the office today for preventative foot care services. She states her nails have grown thick and long and are causing pain and discomfort walking and wearing her shoes. She also admits the callus is no longer painful. She admits to being a diabetic and having neuropathy in her feet. She does admit that part of the neuropathy is due to the side effects of her multiple myeloma medication.. She presents the office today for an evaluation and treatment of her feet.    Physical examination.  DP pulses palpable  B/L.  PT pulses non palpable due to swelling  Capillary return within normal limits. Temperature gradient within normal limits   Neurologic  Diminished semmes -weinstein test.  Musculoskeletal.  Hammertoes are present. Muscle power is within normal limits. No pain crepitus or limitation of motion in the feet noted.  Pes planus foot type. Pain 1st MPJ right greater than left.  Palpable pain at the insertion plantar fascia left foot.  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  [name] RTC 3 months for preventive footcare services. Recommend powerstep insoles.     Gardiner Barefoot DPM

## 2016-12-09 DIAGNOSIS — Z5111 Encounter for antineoplastic chemotherapy: Secondary | ICD-10-CM | POA: Diagnosis not present

## 2016-12-09 DIAGNOSIS — C9000 Multiple myeloma not having achieved remission: Secondary | ICD-10-CM | POA: Diagnosis not present

## 2016-12-19 DIAGNOSIS — G4733 Obstructive sleep apnea (adult) (pediatric): Secondary | ICD-10-CM | POA: Diagnosis not present

## 2016-12-23 DIAGNOSIS — C9000 Multiple myeloma not having achieved remission: Secondary | ICD-10-CM | POA: Diagnosis not present

## 2016-12-23 DIAGNOSIS — Z5111 Encounter for antineoplastic chemotherapy: Secondary | ICD-10-CM | POA: Diagnosis not present

## 2017-01-05 DIAGNOSIS — Z79899 Other long term (current) drug therapy: Secondary | ICD-10-CM | POA: Diagnosis not present

## 2017-01-05 DIAGNOSIS — N183 Chronic kidney disease, stage 3 (moderate): Secondary | ICD-10-CM | POA: Diagnosis not present

## 2017-01-05 DIAGNOSIS — I10 Essential (primary) hypertension: Secondary | ICD-10-CM | POA: Diagnosis not present

## 2017-01-05 DIAGNOSIS — E559 Vitamin D deficiency, unspecified: Secondary | ICD-10-CM | POA: Diagnosis not present

## 2017-01-05 DIAGNOSIS — D509 Iron deficiency anemia, unspecified: Secondary | ICD-10-CM | POA: Diagnosis not present

## 2017-01-06 DIAGNOSIS — C9000 Multiple myeloma not having achieved remission: Secondary | ICD-10-CM | POA: Diagnosis not present

## 2017-01-06 DIAGNOSIS — Z5111 Encounter for antineoplastic chemotherapy: Secondary | ICD-10-CM | POA: Diagnosis not present

## 2017-01-12 DIAGNOSIS — N183 Chronic kidney disease, stage 3 (moderate): Secondary | ICD-10-CM | POA: Diagnosis not present

## 2017-01-12 DIAGNOSIS — I509 Heart failure, unspecified: Secondary | ICD-10-CM | POA: Diagnosis not present

## 2017-01-12 DIAGNOSIS — N250 Renal osteodystrophy: Secondary | ICD-10-CM | POA: Diagnosis not present

## 2017-01-12 DIAGNOSIS — I10 Essential (primary) hypertension: Secondary | ICD-10-CM | POA: Diagnosis not present

## 2017-01-19 DIAGNOSIS — G4733 Obstructive sleep apnea (adult) (pediatric): Secondary | ICD-10-CM | POA: Diagnosis not present

## 2017-01-26 DIAGNOSIS — R609 Edema, unspecified: Secondary | ICD-10-CM | POA: Diagnosis not present

## 2017-01-26 DIAGNOSIS — E890 Postprocedural hypothyroidism: Secondary | ICD-10-CM | POA: Diagnosis not present

## 2017-01-26 DIAGNOSIS — E1165 Type 2 diabetes mellitus with hyperglycemia: Secondary | ICD-10-CM | POA: Diagnosis not present

## 2017-01-26 DIAGNOSIS — E113299 Type 2 diabetes mellitus with mild nonproliferative diabetic retinopathy without macular edema, unspecified eye: Secondary | ICD-10-CM | POA: Diagnosis not present

## 2017-01-27 DIAGNOSIS — D472 Monoclonal gammopathy: Secondary | ICD-10-CM | POA: Diagnosis not present

## 2017-01-27 DIAGNOSIS — D649 Anemia, unspecified: Secondary | ICD-10-CM | POA: Diagnosis not present

## 2017-01-27 DIAGNOSIS — C9002 Multiple myeloma in relapse: Secondary | ICD-10-CM | POA: Diagnosis not present

## 2017-01-27 DIAGNOSIS — M549 Dorsalgia, unspecified: Secondary | ICD-10-CM | POA: Diagnosis not present

## 2017-01-27 DIAGNOSIS — C9000 Multiple myeloma not having achieved remission: Secondary | ICD-10-CM | POA: Diagnosis not present

## 2017-01-27 DIAGNOSIS — R6889 Other general symptoms and signs: Secondary | ICD-10-CM | POA: Diagnosis not present

## 2017-01-27 DIAGNOSIS — M546 Pain in thoracic spine: Secondary | ICD-10-CM | POA: Diagnosis not present

## 2017-02-03 DIAGNOSIS — D696 Thrombocytopenia, unspecified: Secondary | ICD-10-CM | POA: Diagnosis not present

## 2017-02-03 DIAGNOSIS — C9000 Multiple myeloma not having achieved remission: Secondary | ICD-10-CM | POA: Diagnosis not present

## 2017-02-03 DIAGNOSIS — Z5111 Encounter for antineoplastic chemotherapy: Secondary | ICD-10-CM | POA: Diagnosis not present

## 2017-02-03 DIAGNOSIS — R238 Other skin changes: Secondary | ICD-10-CM | POA: Diagnosis not present

## 2017-02-03 DIAGNOSIS — Z09 Encounter for follow-up examination after completed treatment for conditions other than malignant neoplasm: Secondary | ICD-10-CM | POA: Diagnosis not present

## 2017-02-09 DIAGNOSIS — E1122 Type 2 diabetes mellitus with diabetic chronic kidney disease: Secondary | ICD-10-CM | POA: Diagnosis not present

## 2017-02-09 DIAGNOSIS — N183 Chronic kidney disease, stage 3 (moderate): Secondary | ICD-10-CM | POA: Diagnosis not present

## 2017-02-09 DIAGNOSIS — D649 Anemia, unspecified: Secondary | ICD-10-CM | POA: Diagnosis not present

## 2017-02-09 DIAGNOSIS — C9000 Multiple myeloma not having achieved remission: Secondary | ICD-10-CM | POA: Diagnosis not present

## 2017-02-09 DIAGNOSIS — E039 Hypothyroidism, unspecified: Secondary | ICD-10-CM | POA: Diagnosis not present

## 2017-02-17 DIAGNOSIS — C9000 Multiple myeloma not having achieved remission: Secondary | ICD-10-CM | POA: Diagnosis not present

## 2017-02-17 DIAGNOSIS — Z09 Encounter for follow-up examination after completed treatment for conditions other than malignant neoplasm: Secondary | ICD-10-CM | POA: Diagnosis not present

## 2017-02-17 DIAGNOSIS — Z5111 Encounter for antineoplastic chemotherapy: Secondary | ICD-10-CM | POA: Diagnosis not present

## 2017-02-17 DIAGNOSIS — R238 Other skin changes: Secondary | ICD-10-CM | POA: Diagnosis not present

## 2017-03-09 ENCOUNTER — Ambulatory Visit: Payer: Medicare Other | Admitting: Podiatry

## 2017-03-11 ENCOUNTER — Other Ambulatory Visit: Payer: Self-pay

## 2017-03-11 MED ORDER — FUROSEMIDE 20 MG PO TABS: ORAL_TABLET | ORAL | 3 refills | Status: DC

## 2017-03-11 NOTE — Telephone Encounter (Signed)
Refilled lasix per fax request 

## 2017-03-21 DIAGNOSIS — R600 Localized edema: Secondary | ICD-10-CM | POA: Diagnosis not present

## 2017-03-21 DIAGNOSIS — C9000 Multiple myeloma not having achieved remission: Secondary | ICD-10-CM | POA: Diagnosis not present

## 2017-03-21 DIAGNOSIS — C9001 Multiple myeloma in remission: Secondary | ICD-10-CM | POA: Diagnosis not present

## 2017-03-24 DIAGNOSIS — C9000 Multiple myeloma not having achieved remission: Secondary | ICD-10-CM | POA: Diagnosis not present

## 2017-03-24 DIAGNOSIS — Z5111 Encounter for antineoplastic chemotherapy: Secondary | ICD-10-CM | POA: Diagnosis not present

## 2017-04-06 ENCOUNTER — Ambulatory Visit: Payer: Medicare Other | Admitting: Podiatry

## 2017-04-21 DIAGNOSIS — C9000 Multiple myeloma not having achieved remission: Secondary | ICD-10-CM | POA: Diagnosis not present

## 2017-04-21 DIAGNOSIS — Z5111 Encounter for antineoplastic chemotherapy: Secondary | ICD-10-CM | POA: Diagnosis not present

## 2017-04-26 ENCOUNTER — Encounter: Payer: Self-pay | Admitting: Cardiology

## 2017-04-26 ENCOUNTER — Ambulatory Visit (INDEPENDENT_AMBULATORY_CARE_PROVIDER_SITE_OTHER): Payer: Medicare Other | Admitting: Cardiology

## 2017-04-26 VITALS — BP 108/60 | HR 70 | Ht 64.0 in | Wt 334.0 lb

## 2017-04-26 DIAGNOSIS — I5022 Chronic systolic (congestive) heart failure: Secondary | ICD-10-CM | POA: Diagnosis not present

## 2017-04-26 DIAGNOSIS — E782 Mixed hyperlipidemia: Secondary | ICD-10-CM

## 2017-04-26 DIAGNOSIS — I10 Essential (primary) hypertension: Secondary | ICD-10-CM | POA: Diagnosis not present

## 2017-04-26 DIAGNOSIS — I428 Other cardiomyopathies: Secondary | ICD-10-CM

## 2017-04-26 NOTE — Progress Notes (Signed)
Clinical Summary [name] is a 72 y.o.female seen today for follow up of the following medical problems.   1. NICM  - echo 07/2013 LVEF 40-45%, mod to severe LVH.  - previously had ICD, had troubles with lead dislodgment and inappropriate shocks in the past and device was removed. Her LVEF has improved and she has not required consideration for another device.   - 06/2016 LVEF 30-35%, diffuse hypokinesis, grade I diastolic dysfunction  - occasional SOB. Recent increase in swelling after cruise, has started to improve.  - weight up from 313 to 334 lbs. Taking lasix 23m in AM and 230mPM, can have some mixed compliance.     2. HTN  - compliant with meds  3. Palpitaitons - no significant symptoms since last visit.    4. Multiple myeloma - [place] [facility]  5. CKD - followed by renal Dr BeHinda Lenis  6. DM2 - followed by endocrine.   7. OSA - followed by Dr HaLuan Pulling recently started CPAP machine.  - improved daytime somnolence.   8. Hypothyroidism - recent increase in synthroid she reports.    SH:  [place] Past Medical History:  Diagnosis Date  . Anemia    takes Ferrous Fumarate 2 times week  . Arthritis    ankles  . Blood transfusion    hx of-[id number]  . Cardiomyopathy secondary   . Cataract    bilateral and immature  . [place]F ([place]) [place]  takes Lasix prn, Echo 2015 with EF 40%  . Chronic back pain   . Diabetes mellitus    takes Glipizide and Metformin daily  . Dysrhythmia    pt takes Digoxin and Carvedilol daily  . GERD (gastroesophageal reflux disease)    takes Protonix seldom  . Gout    hx of x 1 time in Aug 2012  . H/O hiatal hernia   . Hemorrhoid   . Hypertension    takes Diovan and Spirololactone daily  . Left leg pain   . Multiple myeloma (HCC)    [facility]  Overweight(278.02)   . Peripheral neuropathy   . Pneumonia    hx of;many yrs ago  . Sleep apnea    doesn't use CPAP  . Systolic heart failure    Chronic  . Thyroid nodule    right     Allergies  Allergen Reactions  . Doxycycline Rash     Current Outpatient Medications  Medication Sig Dispense Refill  . aspirin EC 81 MG tablet Take 81 mg by mouth every evening.    . aMarland Kitchenorvastatin (LIPITOR) 40 MG tablet Take 40 mg by mouth daily.     . carvedilol (COREG) 25 MG tablet Take 25 mg by mouth 2 (two) times daily with a meal.    . CINNAMON PO Take 2 capsules by mouth every morning.     . DMarland KitchenRATUMUMAB IV Inject into the vein every Thursday.    . ferrous sulfate 325 (65 FE) MG tablet Take 325 mg by mouth daily.     . Flaxseed, Linseed, (FLAX SEED OIL PO) Take 1 capsule by mouth every morning.     . furosemide (LASIX) 20 MG tablet Take 40 mg In the Morning and 20 mg In the Evening 135 tablet 3  . gabapentin (NEURONTIN) 300  MG capsule Take 300 mg by mouth daily as needed (for neuropathy pain/Leg pain).     [name] HUMALOG KWIKPEN 100 UNIT/ML KiwkPen Take 20-25 Units by mouth 3 (three) times daily. As directed per sliding scale. To take 25 units upon taking Steroid regimen prior to scheduled treatments    . levothyroxine (SYNTHROID, LEVOTHROID) 100 MCG tablet Take 100 mcg by mouth daily before breakfast.    . lidocaine-prilocaine (EMLA) cream Apply 1 application topically as needed (for port access).     . montelukast (SINGULAIR) 10 MG tablet Take 10 mg by mouth See admin instructions. Take one tablet on the day before, the day of, and the day after treatments. Treatments are taken on Thursdays of each week    . prochlorperazine (COMPAZINE) 10 MG tablet Take 10 mg by mouth every 6 (six) hours as needed for nausea or vomiting.     [name] spironolactone (ALDACTONE) 25 MG tablet Take 0.5 tablets (12.5 mg total) by mouth daily. 45 tablet 3  . tetrahydrozoline (EYE DROPS) 0.05 % ophthalmic solution Place 1 drop  into both eyes daily as needed. For dry eye relief     No current facility-administered medications for this visit.      Past Surgical History:  Procedure Laterality Date  . ABDOMINAL HYSTERECTOMY  1991  . APPENDECTOMY  1991  . CARDIAC DEFIBRILLATOR PLACEMENT  >48yr ago  . Cardiac defirillator removed     removed <365monthremoved d/t shocking pt 15times;pierced sac of heart 1000cc ;drained  . CHOLECYSTECTOMY OPEN  1992  . COLONOSCOPY    . COLONOSCOPY N/A 02/08/2014   slf: 1. NO SOURCE FOR ANEMIA IDENTIFIED. 2. SINGLE polyp removed 3. Moderate divertculosis in the sigmoid colon and descencing colon. 4. Moderate sized internal hemorrhoids.   . Marland Kitchenefibrillator removed    . ESOPHAGOGASTRODUODENOSCOPY N/A 02/08/2014   SLF: No source for anemia identified 2. Black foreign body in cardia 3. Mild erosive gastriitis (inflammaion0 was found in the gastric antrum; multiple biopsies were performed.   . leg lipoma    . ovarian cyst removed  1991  . stomach stapling surgery  80's  . THYROID LOBECTOMY  07/27/2011   Procedure: THYROID LOBECTOMY;  Surgeon: ErGayland CurryMD,FACS;  [facility] Service: General;  Laterality: Right;  . TUBAL LIGATION  1971  . tummy tuck  1991     Allergies  Allergen Reactions  . Doxycycline Rash      Family History  Problem Relation Age of Onset  . Kidney failure Mother   . Heart failure Other   . Anesthesia problems Neg Hx   . Hypotension Neg Hx   . Malignant hyperthermia Neg Hx   . Pseudochol deficiency Neg Hx      Social History [name] that  has never smoked. she has never used smokeless tobacco. [name] that she drinks alcohol.   Review of Systems CONSTITUTIONAL: No weight loss, fever, chills, weakness or fatigue.  HEENT: Eyes: No visual loss, blurred vision, double vision or yellow sclerae.No hearing loss, sneezing, congestion, runny nose or sore throat.  SKIN: No rash or itching.  CARDIOVASCULAR: per  hpi RESPIRATORY: per hpi GASTROINTESTINAL: No anorexia, nausea, vomiting or diarrhea. No abdominal pain or blood.  GENITOURINARY: No burning on urination, no polyuria NEUROLOGICAL: No headache, dizziness, syncope, paralysis, ataxia, numbness or tingling in the extremities. No change in bowel or bladder control.  MUSCULOSKELETAL: No muscle, back pain, joint pain or stiffness.  LYMPHATICS: No enlarged nodes. No history of  splenectomy.  PSYCHIATRIC: No history of depression or anxiety.  ENDOCRINOLOGIC: No reports of sweating, cold or heat intolerance. No polyuria or polydipsia.  [name]   Physical Examination Vitals:   04/26/17 1002  BP: 108/60  Pulse: 70  SpO2: 97%   Vitals:   04/26/17 1002  Weight: (!) 334 lb (151.5 kg)  Height: _0  (1.626 m)    Gen: resting comfortably, no acute distress HEENT: no scleral icterus, pupils equal round and reactive, no palptable cervical adenopathy,  CV: RRR, no m/r/g, no jvd Resp: Clear to auscultation bilaterally GI: abdomen is soft, non-tender, non-distended, normal bowel sounds, no hepatosplenomegaly MSK: extremities are warm, 1+ bilateral LE edema Skin: warm, no rash Neuro:  no focal deficits Psych: appropriate affect   Diagnostic Studies 08/07/13 Echo Study Conclusions  - Procedure narrative: Transthoracic echocardiography. Image quality was suboptimal. The study was technically difficult, as a result of poor sound wave transmission and body habitus. - Left ventricle: The cavity size was normal. Wall thickness was increased in a pattern of moderate to severe LVH. Systolic function was mildly to moderately reduced. The estimated ejection fraction was in the range of 40% to 45%. Mild to moderate global hypokinesis was seen. There was an increased relative contribution of atrial contraction to ventricular filling. Doppler parameters are consistent with abnormal left ventricular relaxation (grade 1 diastolic dysfunction). - Aortic  valve: Mildly calcified annulus. Trileaflet. - Left atrium: The atrium was mildly dilated. - Right atrium: The atrium was mildly dilated.  07/2013 Event Monitor NSR, frequent PVCs     Assessment and Plan   1. 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  - recent weight gain and edema, improving with improved diuretic compliance. Continue current meds =- repeat echo due to worsening edema.   2. HTN - bp at goal, continue current meds  3. Hyperlipidemia - she will continue statin.   4. OSA - per Dr Luan Pulling    F/u 6 monhts     Arnoldo Lenis, M.D.

## 2017-04-26 NOTE — Patient Instructions (Signed)
Medication Instructions:  Your physician recommends that you continue on your current medications as directed. Please refer to the Current Medication list given to you today.   Labwork: NONE   Testing/Procedures: Your physician has requested that you have an echocardiogram. Echocardiography is a painless test that uses sound waves to create images of your heart. It provides your doctor with information about the size and shape of your heart and how well your heart's chambers and valves are working. This procedure takes approximately one hour. There are no restrictions for this procedure.    Follow-Up: Your physician recommends that you schedule a follow-up appointment in: 3 Months with Dr. Harl Bowie    Any Other Special Instructions Will Be Listed Below (If Applicable).  Call our office in one week with an update on your weights    If you need a refill on your cardiac medications before your next appointment, please call your pharmacy. Thank you for choosing [facility]!

## 2017-04-29 ENCOUNTER — Encounter: Payer: Self-pay | Admitting: Cardiology

## 2017-05-06 ENCOUNTER — Encounter: Payer: Self-pay | Admitting: Podiatry

## 2017-05-06 ENCOUNTER — Ambulatory Visit (INDEPENDENT_AMBULATORY_CARE_PROVIDER_SITE_OTHER): Payer: Medicare Other | Admitting: Podiatry

## 2017-05-06 DIAGNOSIS — Q6650 Congenital pes planus, unspecified foot: Secondary | ICD-10-CM

## 2017-05-06 DIAGNOSIS — E1142 Type 2 diabetes mellitus with diabetic polyneuropathy: Secondary | ICD-10-CM

## 2017-05-06 DIAGNOSIS — M79675 Pain in left toe(s): Secondary | ICD-10-CM | POA: Diagnosis not present

## 2017-05-06 DIAGNOSIS — B351 Tinea unguium: Secondary | ICD-10-CM

## 2017-05-06 DIAGNOSIS — M79674 Pain in right toe(s): Secondary | ICD-10-CM

## 2017-05-06 DIAGNOSIS — E890 Postprocedural hypothyroidism: Secondary | ICD-10-CM | POA: Diagnosis not present

## 2017-05-06 NOTE — Progress Notes (Signed)
This patient presents the office today for preventative foot care services. She states her nails have grown thick and long and are causing pain and discomfort walking and wearing her shoes. She also admits the callus is no longer painful. She admits to being a diabetic and having neuropathy in her feet. She does admit that part of the neuropathy is due to the side effects of her multiple myeloma medication.. She presents the office today for an evaluation and treatment of her feet.    Physical examination.  DP pulses palpable  B/L.  PT pulses non palpable due to swelling  Capillary return within normal limits. Temperature gradient within normal limits   Neurologic  Diminished semmes -weinstein test.  Musculoskeletal.  Hammertoes are present. Muscle power is within normal limits. No pain crepitus or limitation of motion in the feet noted.  Pes planus foot type. Pain 1st MPJ right greater than left.  Palpable pain at the insertion plantar fascia left foot.  NAILS  thick disfigured discolored nails with subungual debris noted  SKIN  Callus plantar aspect left hallux.    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    Debridement of callus.  [name] RTC 3 months for preventive footcare services. Recommend powerstep insoles.     Gardiner Barefoot DPM

## 2017-05-09 ENCOUNTER — Ambulatory Visit (HOSPITAL_COMMUNITY)
Admission: RE | Admit: 2017-05-09 | Discharge: 2017-05-09 | Disposition: A | Discharge status: Home / Self Care | Payer: Medicare Other | Source: Ambulatory Visit | Attending: Cardiology | Admitting: Cardiology

## 2017-05-09 DIAGNOSIS — N183 Chronic kidney disease, stage 3 (moderate): Secondary | ICD-10-CM | POA: Diagnosis not present

## 2017-05-09 DIAGNOSIS — Z79899 Other long term (current) drug therapy: Secondary | ICD-10-CM | POA: Diagnosis not present

## 2017-05-09 DIAGNOSIS — D509 Iron deficiency anemia, unspecified: Secondary | ICD-10-CM | POA: Diagnosis not present

## 2017-05-09 DIAGNOSIS — I420 Dilated cardiomyopathy: Secondary | ICD-10-CM | POA: Diagnosis not present

## 2017-05-09 DIAGNOSIS — I10 Essential (primary) hypertension: Secondary | ICD-10-CM | POA: Diagnosis not present

## 2017-05-09 DIAGNOSIS — E559 Vitamin D deficiency, unspecified: Secondary | ICD-10-CM | POA: Diagnosis not present

## 2017-05-09 DIAGNOSIS — I5022 Chronic systolic (congestive) heart failure: Secondary | ICD-10-CM | POA: Diagnosis not present

## 2017-05-09 DIAGNOSIS — R809 Proteinuria, unspecified: Secondary | ICD-10-CM | POA: Diagnosis not present

## 2017-05-09 LAB — ECHOCARDIOGRAM COMPLETE
CHL CUP MV DEC (S): 211
CHL CUP RV SYS PRESS: 33 mmHg
CHL CUP TV REG PEAK VELOCITY: 248 cm/s
E/e' ratio: 7.79
EWDT: 211 ms
FS: 25 % — AB (ref 28–44)
IVS/LV PW RATIO, ED: 0.93
LA ID, A-P, ES: 47 mm
LA diam index: 1.73 cm/m2
LA vol A4C: 72.3 ml
LA vol index: 28 mL/m2
LA vol: 76 mL
LEFT ATRIUM END SYS DIAM: 47 mm
LV E/e' medial: 7.79
LV E/e'average: 7.79
LV TDI E'LATERAL: 9.36
LV dias vol: 104 mL (ref 46–106)
LV sys vol: 52 mL — AB
LVDIAVOLIN: 38 mL/m2
LVELAT: 9.36 cm/s
LVOT VTI: 22.4 cm
LVOT area: 2.84 cm2
LVOT peak grad rest: 3 mmHg
LVOTD: 19 mm
LVOTPV: 88.9 cm/s
LVOTSV: 64 mL
LVSYSVOLIN: 19 mL/m2
MV pk A vel: 76.4 m/s
MVPG: 2 mmHg
MVPKEVEL: 72.9 m/s
PW: 10.8 mm — AB (ref 0.6–1.1)
RV LATERAL S' VELOCITY: 9.25 cm/s
Simpson's disk: 50
Stroke v: 52 ml
TDI e' medial: 7.18
TR max vel: 248 cm/s

## 2017-05-09 NOTE — Progress Notes (Addendum)
*  PRELIMINARY RESULTS* Echocardiogram 2D Echocardiogram has been performed. Patient refused Definity at this time.  Samuel Germany 05/09/2017, 11:52 AM

## 2017-05-11 ENCOUNTER — Telehealth: Payer: Self-pay | Admitting: Cardiology

## 2017-05-11 NOTE — Progress Notes (Signed)
Patient presents home weights, since 04/27/17 have trended down from 334 to 324 on 05/05/17   Zandra Abts MD

## 2017-05-18 DIAGNOSIS — D709 Neutropenia, unspecified: Secondary | ICD-10-CM | POA: Diagnosis not present

## 2017-05-18 DIAGNOSIS — D509 Iron deficiency anemia, unspecified: Secondary | ICD-10-CM | POA: Diagnosis not present

## 2017-05-18 DIAGNOSIS — N183 Chronic kidney disease, stage 3 (moderate): Secondary | ICD-10-CM | POA: Diagnosis not present

## 2017-05-18 DIAGNOSIS — N2581 Secondary hyperparathyroidism of renal origin: Secondary | ICD-10-CM | POA: Diagnosis not present

## 2017-05-19 DIAGNOSIS — R6889 Other general symptoms and signs: Secondary | ICD-10-CM | POA: Diagnosis not present

## 2017-05-19 DIAGNOSIS — D472 Monoclonal gammopathy: Secondary | ICD-10-CM | POA: Diagnosis not present

## 2017-05-19 DIAGNOSIS — C9000 Multiple myeloma not having achieved remission: Secondary | ICD-10-CM | POA: Diagnosis not present

## 2017-05-24 DIAGNOSIS — E611 Iron deficiency: Secondary | ICD-10-CM | POA: Diagnosis not present

## 2017-05-24 DIAGNOSIS — C9000 Multiple myeloma not having achieved remission: Secondary | ICD-10-CM | POA: Diagnosis not present

## 2017-05-24 DIAGNOSIS — D649 Anemia, unspecified: Secondary | ICD-10-CM | POA: Diagnosis not present

## 2017-05-24 DIAGNOSIS — N289 Disorder of kidney and ureter, unspecified: Secondary | ICD-10-CM | POA: Diagnosis not present

## 2017-05-25 DIAGNOSIS — G4733 Obstructive sleep apnea (adult) (pediatric): Secondary | ICD-10-CM | POA: Diagnosis not present

## 2017-05-26 DIAGNOSIS — C9000 Multiple myeloma not having achieved remission: Secondary | ICD-10-CM | POA: Diagnosis not present

## 2017-05-26 DIAGNOSIS — Z5111 Encounter for antineoplastic chemotherapy: Secondary | ICD-10-CM | POA: Diagnosis not present

## 2017-06-02 DIAGNOSIS — N289 Disorder of kidney and ureter, unspecified: Secondary | ICD-10-CM | POA: Diagnosis not present

## 2017-06-02 DIAGNOSIS — E611 Iron deficiency: Secondary | ICD-10-CM | POA: Diagnosis not present

## 2017-06-02 DIAGNOSIS — C9000 Multiple myeloma not having achieved remission: Secondary | ICD-10-CM | POA: Diagnosis not present

## 2017-06-02 DIAGNOSIS — D649 Anemia, unspecified: Secondary | ICD-10-CM | POA: Diagnosis not present

## 2017-06-08 ENCOUNTER — Ambulatory Visit (HOSPITAL_COMMUNITY)
Admission: RE | Admit: 2017-06-08 | Discharge: 2017-06-08 | Disposition: A | Discharge status: Home / Self Care | Payer: Medicare Other | Source: Ambulatory Visit | Attending: Family Medicine | Admitting: Family Medicine

## 2017-06-08 ENCOUNTER — Other Ambulatory Visit (HOSPITAL_COMMUNITY): Payer: Self-pay | Admitting: Family Medicine

## 2017-06-08 DIAGNOSIS — N183 Chronic kidney disease, stage 3 (moderate): Secondary | ICD-10-CM | POA: Diagnosis not present

## 2017-06-08 DIAGNOSIS — M5136 Other intervertebral disc degeneration, lumbar region: Secondary | ICD-10-CM | POA: Insufficient documentation

## 2017-06-08 DIAGNOSIS — E1122 Type 2 diabetes mellitus with diabetic chronic kidney disease: Secondary | ICD-10-CM | POA: Diagnosis not present

## 2017-06-08 DIAGNOSIS — M545 Low back pain: Secondary | ICD-10-CM

## 2017-06-08 DIAGNOSIS — D630 Anemia in neoplastic disease: Secondary | ICD-10-CM | POA: Diagnosis not present

## 2017-06-08 DIAGNOSIS — C9002 Multiple myeloma in relapse: Secondary | ICD-10-CM | POA: Insufficient documentation

## 2017-06-23 DIAGNOSIS — Z5111 Encounter for antineoplastic chemotherapy: Secondary | ICD-10-CM | POA: Diagnosis not present

## 2017-06-23 DIAGNOSIS — C9000 Multiple myeloma not having achieved remission: Secondary | ICD-10-CM | POA: Diagnosis not present

## 2017-07-06 DIAGNOSIS — E890 Postprocedural hypothyroidism: Secondary | ICD-10-CM | POA: Diagnosis not present

## 2017-07-12 DIAGNOSIS — I5040 Unspecified combined systolic (congestive) and diastolic (congestive) heart failure: Secondary | ICD-10-CM | POA: Diagnosis not present

## 2017-07-12 DIAGNOSIS — G4733 Obstructive sleep apnea (adult) (pediatric): Secondary | ICD-10-CM | POA: Diagnosis not present

## 2017-07-12 DIAGNOSIS — E782 Mixed hyperlipidemia: Secondary | ICD-10-CM | POA: Diagnosis not present

## 2017-07-19 DIAGNOSIS — C9000 Multiple myeloma not having achieved remission: Secondary | ICD-10-CM | POA: Diagnosis not present

## 2017-07-19 DIAGNOSIS — Z5111 Encounter for antineoplastic chemotherapy: Secondary | ICD-10-CM | POA: Diagnosis not present

## 2017-07-21 DIAGNOSIS — Z5111 Encounter for antineoplastic chemotherapy: Secondary | ICD-10-CM | POA: Diagnosis not present

## 2017-07-21 DIAGNOSIS — C9000 Multiple myeloma not having achieved remission: Secondary | ICD-10-CM | POA: Diagnosis not present

## 2017-08-01 ENCOUNTER — Ambulatory Visit: Payer: Self-pay | Admitting: Cardiology

## 2017-08-01 NOTE — Progress Notes (Deleted)
Clinical Summary [name] is a 72 y.o.female seen today for follow up of the following medical problems.   1. NICM  - echo 07/2013 LVEF 40-45%, mod to severe LVH.  - previously had ICD, had troubles with lead dislodgment and inappropriate shocks in the past and device was removed. Her LVEF has improved and she has not required consideration for another device.  - 06/2016 LVEF 30-35%, diffuse hypokinesis, grade I diastolic dysfunction - 11/4494 echo LVEF 50-55%  - occasional SOB. Recent increase in swelling after cruise, has started to improve.  - weight up from 313 to 334 lbs. Taking lasix '40mg'$  in AM and '20mg'$  PM, can have some mixed compliance.     2. HTN  - compliant with meds  3. Palpitaitons - no significant symptoms since last visit.    4. Multiple myeloma - followed by Dr Tressie Stalker in [place] and Dr Melba Coon at [facility].   5. CKD - followed by renal Dr Hinda Lenis    6. DM2 - followed by endocrine.   7. OSA -followed by DrHawkins - recently started CPAP machine.  - improved daytime somnolence.   8. Hypothyroidism - recent increase in synthroid she reports.    SH:  Recent cruise to [place] and [place]    Past Medical History:  Diagnosis Date  . Anemia    takes Ferrous Fumarate 2 times week  . Arthritis    ankles  . Blood transfusion    hx of-62yr ago  . Cardiomyopathy secondary   . Cataract    bilateral and immature  . CHF (congestive heart failure) ([place]    takes Lasix prn, Echo 2015 with EF 40%  . Chronic back pain   . Diabetes mellitus    takes Glipizide and Metformin daily  . Dysrhythmia    pt takes Digoxin and Carvedilol daily  . GERD (gastroesophageal reflux disease)    takes Protonix seldom  . Gout    hx of x 1 time in Aug 2012  . H/O hiatal hernia   . Hemorrhoid   . Hypertension    takes Diovan and Spirololactone daily  . Left leg pain   . Multiple myeloma (HCC)    [place] . Overweight(278.02)   . Peripheral neuropathy   . Pneumonia    hx of;many yrs ago  . Sleep apnea    doesn't use CPAP  . Systolic heart failure    Chronic  . Thyroid nodule    right     Allergies  Allergen Reactions  . Doxycycline Rash     Current Outpatient Medications  Medication Sig Dispense Refill  . aspirin EC 81 MG tablet Take 81 mg by mouth every evening.    .Marland Kitchenatorvastatin (LIPITOR) 40 MG tablet Take 40 mg by mouth daily.     . carvedilol (COREG) 25 MG tablet Take 25 mg by mouth 2 (two) times daily with a meal.    . CINNAMON PO Take 2 capsules by mouth every morning.     .Marland KitchenDARATUMUMAB IV Inject into the vein every Thursday.    . ferrous sulfate 325 (65 FE) MG tablet Take 325 mg by mouth daily.     . Flaxseed, Linseed, (FLAX SEED OIL PO) Take 1 capsule by mouth every morning.     . furosemide (LASIX) 20 MG tablet Take 40 mg In the Morning and 20 mg In the Evening 135 tablet 3  .  HUMALOG KWIKPEN 100 UNIT/ML KiwkPen Take 20-25 Units by mouth 3 (three) times daily. As directed per sliding scale. To take 25 units upon taking Steroid regimen prior to scheduled treatments    . levothyroxine (SYNTHROID, LEVOTHROID) 100 MCG tablet Take 100 mcg by mouth daily before breakfast.    . montelukast (SINGULAIR) 10 MG tablet Take 10 mg by mouth See admin instructions. Take one tablet on the day before, the day of, and the day after treatments. Treatments are taken on Thursdays of each week    . pomalidomide (POMALYST) 2 MG capsule Take by mouth.    . prochlorperazine (COMPAZINE) 10 MG tablet Take 10 mg by mouth every 6 (six) hours as needed for nausea or vomiting.     [name] spironolactone (ALDACTONE) 25 MG tablet Take 0.5 tablets (12.5 mg total) by mouth daily. 45 tablet 3  . tetrahydrozoline (EYE DROPS) 0.05 % ophthalmic solution Place 1 drop into both eyes daily as needed. For dry eye relief     No current facility-administered medications for this visit.       Past Surgical History:  Procedure Laterality Date  . ABDOMINAL HYSTERECTOMY  1991  . APPENDECTOMY  1991  . CARDIAC DEFIBRILLATOR PLACEMENT  >59yr ago  . Cardiac defirillator removed     removed <351monthremoved d/t shocking pt 15times;pierced sac of heart 1000cc ;drained  . CHOLECYSTECTOMY OPEN  1992  . COLONOSCOPY    . COLONOSCOPY N/A 02/08/2014   slf: 1. NO SOURCE FOR ANEMIA IDENTIFIED. 2. SINGLE polyp removed 3. Moderate divertculosis in the sigmoid colon and descencing colon. 4. Moderate sized internal hemorrhoids.   . Marland Kitchenefibrillator removed    . ESOPHAGOGASTRODUODENOSCOPY N/A 02/08/2014   SLF: No source for anemia identified 2. Black foreign body in cardia 3. Mild erosive gastriitis (inflammaion0 was found in the gastric antrum; multiple biopsies were performed.   . leg lipoma    . ovarian cyst removed  1991  . stomach stapling surgery  80's  . THYROID LOBECTOMY  07/27/2011   Procedure: THYROID LOBECTOMY;  Surgeon: ErGayland CurryMD,FACS;  [facility] Service: General;  Laterality: Right;  . TUBAL LIGATION  1971  . tummy tuck  1991     Allergies  Allergen Reactions  . Doxycycline Rash      Family History  Problem Relation Age of Onset  . Kidney failure Mother   . Heart failure Other   . Anesthesia problems Neg Hx   . Hypotension Neg Hx   . Malignant hyperthermia Neg Hx   . Pseudochol deficiency Neg Hx      Social History [name] that she has never smoked. She has never used smokeless tobacco. Ms. LiDominiqueeports that she drinks alcohol.   Review of Systems CONSTITUTIONAL: No weight loss, fever, chills, weakness or fatigue.  HEENT: Eyes: No visual loss, blurred vision, double vision or yellow sclerae.No hearing loss, sneezing, congestion, runny nose or sore throat.  SKIN: No rash or itching.  CARDIOVASCULAR:  RESPIRATORY: No shortness of breath, cough or sputum.  GASTROINTESTINAL: No anorexia, nausea, vomiting or diarrhea. No abdominal  pain or blood.  GENITOURINARY: No burning on urination, no polyuria NEUROLOGICAL: No headache, dizziness, syncope, paralysis, ataxia, numbness or tingling in the extremities. No change in bowel or bladder control.  MUSCULOSKELETAL: No muscle, back pain, joint pain or stiffness.  LYMPHATICS: No enlarged nodes. No history of splenectomy.  PSYCHIATRIC: No history of depression or anxiety.  ENDOCRINOLOGIC: No reports of sweating, cold or heat intolerance.  No polyuria or polydipsia.  [name]   Physical Examination There were no vitals filed for this visit. There were no vitals filed for this visit.  Gen: resting comfortably, no acute distress HEENT: no scleral icterus, pupils equal round and reactive, no palptable cervical adenopathy,  CV Resp: Clear to auscultation bilaterally GI: abdomen is soft, non-tender, non-distended, normal bowel sounds, no hepatosplenomegaly MSK: extremities are warm, no edema.  Skin: warm, no rash Neuro:  no focal deficits Psych: appropriate affect   Diagnostic Studies 08/07/13 Echo Study Conclusions  - Procedure narrative: Transthoracic echocardiography. Image quality was suboptimal. The study was technically difficult, as a result of poor sound wave transmission and body habitus. - Left ventricle: The cavity size was normal. Wall thickness was increased in a pattern of moderate to severe LVH. Systolic function was mildly to moderately reduced. The estimated ejection fraction was in the range of 40% to 45%. Mild to moderate global hypokinesis was seen. There was an increased relative contribution of atrial contraction to ventricular filling. Doppler parameters are consistent with abnormal left ventricular relaxation (grade 1 diastolic dysfunction). - Aortic valve: Mildly calcified annulus. Trileaflet. - Left atrium: The atrium was mildly dilated. - Right atrium: The atrium was mildly dilated.  07/2013 Event Monitor NSR, frequent PVCs    04/2017  echo Study Conclusions  - Left ventricle: The cavity size was normal. Wall thickness was   normal. Systolic function was normal. The estimated ejection   fraction was in the range of 50% to 55%. - Left atrium: The atrium was mildly dilated. - Pulmonary arteries: PA peak pressure: 33 mm Hg (S). - Impressions: Poor acoustic windows limit study Cannot evaluate   regional wall motion.  Impressions:  - Poor acoustic windows limit study Cannot evaluate regional wall   motion.    Assessment and Plan  1. 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  -recent weight gain and edema, improving with improved diuretic compliance. Continue current meds =- repeat echo due to worsening edema.   2. HTN -bp at goal, continue current meds  3. Hyperlipidemia -she will continue statin.   4. OSA - per Dr Luan Pulling    F/u 6 monhts       Arnoldo Lenis, M.D., F.A.C.C.

## 2017-08-05 ENCOUNTER — Ambulatory Visit (INDEPENDENT_AMBULATORY_CARE_PROVIDER_SITE_OTHER): Payer: Medicare Other | Admitting: Podiatry

## 2017-08-05 ENCOUNTER — Encounter: Payer: Self-pay | Admitting: Podiatry

## 2017-08-05 DIAGNOSIS — Q6650 Congenital pes planus, unspecified foot: Secondary | ICD-10-CM | POA: Diagnosis not present

## 2017-08-05 DIAGNOSIS — E1142 Type 2 diabetes mellitus with diabetic polyneuropathy: Secondary | ICD-10-CM

## 2017-08-05 DIAGNOSIS — L84 Corns and callosities: Secondary | ICD-10-CM | POA: Diagnosis not present

## 2017-08-05 DIAGNOSIS — M79675 Pain in left toe(s): Secondary | ICD-10-CM

## 2017-08-05 DIAGNOSIS — B351 Tinea unguium: Secondary | ICD-10-CM

## 2017-08-05 DIAGNOSIS — M79674 Pain in right toe(s): Secondary | ICD-10-CM

## 2017-08-05 NOTE — Progress Notes (Signed)
This patient presents the office today for preventative foot care services. She states her nails have grown thick and long and are causing pain and discomfort walking and wearing her shoes. She also admits the callus is no longer painful. She admits to being a diabetic and having neuropathy in her feet. She does admit that part of the neuropathy is due to the side effects of her multiple myeloma medication.. She presents the office today for an evaluation and treatment of her feet.    Physical examination.  DP pulses palpable  B/L.  PT pulses non palpable due to swelling  Capillary return within normal limits. Temperature gradient within normal limits   Neurologic  Diminished semmes -weinstein test.  Musculoskeletal.  Hammertoes are present. Muscle power is within normal limits. No pain crepitus or limitation of motion in the feet noted.  Pes planus foot type. Pain 1st MPJ right greater than left.  Palpable pain at the insertion plantar fascia left foot.  NAILS  thick disfigured discolored nails with subungual debris noted  SKIN  Callus plantar aspect left hallux.    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    Debridement of callus.  [name] RTC 3 months for preventive footcare services.     Gardiner Barefoot DPM

## 2017-08-11 DIAGNOSIS — C9000 Multiple myeloma not having achieved remission: Secondary | ICD-10-CM | POA: Diagnosis not present

## 2017-08-11 DIAGNOSIS — R6889 Other general symptoms and signs: Secondary | ICD-10-CM | POA: Diagnosis not present

## 2017-08-11 DIAGNOSIS — D472 Monoclonal gammopathy: Secondary | ICD-10-CM | POA: Diagnosis not present

## 2017-08-18 DIAGNOSIS — Z5111 Encounter for antineoplastic chemotherapy: Secondary | ICD-10-CM | POA: Diagnosis not present

## 2017-08-18 DIAGNOSIS — C9000 Multiple myeloma not having achieved remission: Secondary | ICD-10-CM | POA: Diagnosis not present

## 2017-08-29 DIAGNOSIS — R809 Proteinuria, unspecified: Secondary | ICD-10-CM | POA: Diagnosis not present

## 2017-08-29 DIAGNOSIS — I10 Essential (primary) hypertension: Secondary | ICD-10-CM | POA: Diagnosis not present

## 2017-08-29 DIAGNOSIS — E559 Vitamin D deficiency, unspecified: Secondary | ICD-10-CM | POA: Diagnosis not present

## 2017-08-29 DIAGNOSIS — D509 Iron deficiency anemia, unspecified: Secondary | ICD-10-CM | POA: Diagnosis not present

## 2017-08-29 DIAGNOSIS — N183 Chronic kidney disease, stage 3 (moderate): Secondary | ICD-10-CM | POA: Diagnosis not present

## 2017-08-29 DIAGNOSIS — Z79899 Other long term (current) drug therapy: Secondary | ICD-10-CM | POA: Diagnosis not present

## 2017-08-31 DIAGNOSIS — E890 Postprocedural hypothyroidism: Secondary | ICD-10-CM | POA: Diagnosis not present

## 2017-09-05 DIAGNOSIS — R609 Edema, unspecified: Secondary | ICD-10-CM | POA: Diagnosis not present

## 2017-09-05 DIAGNOSIS — E11319 Type 2 diabetes mellitus with unspecified diabetic retinopathy without macular edema: Secondary | ICD-10-CM | POA: Diagnosis not present

## 2017-09-05 DIAGNOSIS — E890 Postprocedural hypothyroidism: Secondary | ICD-10-CM | POA: Diagnosis not present

## 2017-09-07 DIAGNOSIS — I509 Heart failure, unspecified: Secondary | ICD-10-CM | POA: Diagnosis not present

## 2017-09-07 DIAGNOSIS — N2581 Secondary hyperparathyroidism of renal origin: Secondary | ICD-10-CM | POA: Diagnosis not present

## 2017-09-07 DIAGNOSIS — D638 Anemia in other chronic diseases classified elsewhere: Secondary | ICD-10-CM | POA: Diagnosis not present

## 2017-09-07 DIAGNOSIS — N183 Chronic kidney disease, stage 3 (moderate): Secondary | ICD-10-CM | POA: Diagnosis not present

## 2017-09-08 DIAGNOSIS — I509 Heart failure, unspecified: Secondary | ICD-10-CM | POA: Diagnosis not present

## 2017-09-08 DIAGNOSIS — N183 Chronic kidney disease, stage 3 (moderate): Secondary | ICD-10-CM | POA: Diagnosis not present

## 2017-09-08 DIAGNOSIS — D472 Monoclonal gammopathy: Secondary | ICD-10-CM | POA: Diagnosis not present

## 2017-09-08 DIAGNOSIS — C9000 Multiple myeloma not having achieved remission: Secondary | ICD-10-CM | POA: Diagnosis not present

## 2017-09-08 DIAGNOSIS — R6889 Other general symptoms and signs: Secondary | ICD-10-CM | POA: Diagnosis not present

## 2017-09-08 DIAGNOSIS — E119 Type 2 diabetes mellitus without complications: Secondary | ICD-10-CM | POA: Diagnosis not present

## 2017-09-13 DIAGNOSIS — Z7982 Long term (current) use of aspirin: Secondary | ICD-10-CM | POA: Diagnosis not present

## 2017-09-13 DIAGNOSIS — C9000 Multiple myeloma not having achieved remission: Secondary | ICD-10-CM | POA: Diagnosis not present

## 2017-09-13 DIAGNOSIS — Z5181 Encounter for therapeutic drug level monitoring: Secondary | ICD-10-CM | POA: Diagnosis not present

## 2017-09-15 DIAGNOSIS — C9000 Multiple myeloma not having achieved remission: Secondary | ICD-10-CM | POA: Diagnosis not present

## 2017-09-15 DIAGNOSIS — Z5112 Encounter for antineoplastic immunotherapy: Secondary | ICD-10-CM | POA: Diagnosis not present

## 2017-09-28 DIAGNOSIS — D630 Anemia in neoplastic disease: Secondary | ICD-10-CM | POA: Diagnosis not present

## 2017-09-28 DIAGNOSIS — E1122 Type 2 diabetes mellitus with diabetic chronic kidney disease: Secondary | ICD-10-CM | POA: Diagnosis not present

## 2017-09-28 DIAGNOSIS — C9002 Multiple myeloma in relapse: Secondary | ICD-10-CM | POA: Diagnosis not present

## 2017-09-28 DIAGNOSIS — N183 Chronic kidney disease, stage 3 (moderate): Secondary | ICD-10-CM | POA: Diagnosis not present

## 2017-09-28 DIAGNOSIS — E039 Hypothyroidism, unspecified: Secondary | ICD-10-CM | POA: Diagnosis not present

## 2017-09-29 NOTE — Telephone Encounter (Signed)
Signing past encounter of attempted phone call 

## 2017-10-05 DIAGNOSIS — D72819 Decreased white blood cell count, unspecified: Secondary | ICD-10-CM | POA: Diagnosis not present

## 2017-10-05 DIAGNOSIS — C9002 Multiple myeloma in relapse: Secondary | ICD-10-CM | POA: Diagnosis not present

## 2017-10-05 DIAGNOSIS — R6889 Other general symptoms and signs: Secondary | ICD-10-CM | POA: Diagnosis not present

## 2017-10-05 DIAGNOSIS — C9000 Multiple myeloma not having achieved remission: Secondary | ICD-10-CM | POA: Diagnosis not present

## 2017-10-06 DIAGNOSIS — I10 Essential (primary) hypertension: Secondary | ICD-10-CM | POA: Diagnosis not present

## 2017-10-06 DIAGNOSIS — I361 Nonrheumatic tricuspid (valve) insufficiency: Secondary | ICD-10-CM | POA: Diagnosis not present

## 2017-10-06 DIAGNOSIS — Z5111 Encounter for antineoplastic chemotherapy: Secondary | ICD-10-CM | POA: Diagnosis not present

## 2017-10-06 DIAGNOSIS — I517 Cardiomegaly: Secondary | ICD-10-CM | POA: Diagnosis not present

## 2017-10-06 DIAGNOSIS — Z01818 Encounter for other preprocedural examination: Secondary | ICD-10-CM | POA: Diagnosis not present

## 2017-10-06 DIAGNOSIS — R5383 Other fatigue: Secondary | ICD-10-CM | POA: Diagnosis not present

## 2017-10-06 DIAGNOSIS — R6889 Other general symptoms and signs: Secondary | ICD-10-CM | POA: Diagnosis not present

## 2017-10-06 DIAGNOSIS — Z5189 Encounter for other specified aftercare: Secondary | ICD-10-CM | POA: Diagnosis not present

## 2017-10-06 DIAGNOSIS — C9000 Multiple myeloma not having achieved remission: Secondary | ICD-10-CM | POA: Diagnosis not present

## 2017-10-20 DIAGNOSIS — R6889 Other general symptoms and signs: Secondary | ICD-10-CM | POA: Diagnosis not present

## 2017-10-27 DIAGNOSIS — C9000 Multiple myeloma not having achieved remission: Secondary | ICD-10-CM | POA: Diagnosis not present

## 2017-10-27 DIAGNOSIS — Z452 Encounter for adjustment and management of vascular access device: Secondary | ICD-10-CM | POA: Diagnosis not present

## 2017-10-28 DIAGNOSIS — Z52011 Autologous donor, stem cells: Secondary | ICD-10-CM | POA: Diagnosis not present

## 2017-10-28 DIAGNOSIS — C9000 Multiple myeloma not having achieved remission: Secondary | ICD-10-CM | POA: Diagnosis not present

## 2017-10-28 DIAGNOSIS — R6889 Other general symptoms and signs: Secondary | ICD-10-CM | POA: Diagnosis not present

## 2017-10-28 DIAGNOSIS — C9002 Multiple myeloma in relapse: Secondary | ICD-10-CM | POA: Diagnosis not present

## 2017-10-29 DIAGNOSIS — C9000 Multiple myeloma not having achieved remission: Secondary | ICD-10-CM | POA: Diagnosis not present

## 2017-10-30 DIAGNOSIS — C9000 Multiple myeloma not having achieved remission: Secondary | ICD-10-CM | POA: Diagnosis not present

## 2017-10-31 DIAGNOSIS — C9002 Multiple myeloma in relapse: Secondary | ICD-10-CM | POA: Diagnosis not present

## 2017-10-31 DIAGNOSIS — R6889 Other general symptoms and signs: Secondary | ICD-10-CM | POA: Diagnosis not present

## 2017-10-31 DIAGNOSIS — C9000 Multiple myeloma not having achieved remission: Secondary | ICD-10-CM | POA: Diagnosis not present

## 2017-10-31 DIAGNOSIS — Z52011 Autologous donor, stem cells: Secondary | ICD-10-CM | POA: Diagnosis not present

## 2017-11-01 DIAGNOSIS — Z52011 Autologous donor, stem cells: Secondary | ICD-10-CM | POA: Diagnosis not present

## 2017-11-01 DIAGNOSIS — C9000 Multiple myeloma not having achieved remission: Secondary | ICD-10-CM | POA: Diagnosis not present

## 2017-11-01 DIAGNOSIS — R6889 Other general symptoms and signs: Secondary | ICD-10-CM | POA: Diagnosis not present

## 2017-11-01 DIAGNOSIS — C9002 Multiple myeloma in relapse: Secondary | ICD-10-CM | POA: Diagnosis not present

## 2017-11-04 ENCOUNTER — Ambulatory Visit: Payer: Self-pay | Admitting: Cardiology

## 2017-11-04 ENCOUNTER — Ambulatory Visit: Payer: Medicare Other | Admitting: Podiatry

## 2017-11-08 DIAGNOSIS — D472 Monoclonal gammopathy: Secondary | ICD-10-CM | POA: Diagnosis not present

## 2017-11-08 DIAGNOSIS — R6889 Other general symptoms and signs: Secondary | ICD-10-CM | POA: Diagnosis not present

## 2017-11-08 DIAGNOSIS — C9000 Multiple myeloma not having achieved remission: Secondary | ICD-10-CM | POA: Diagnosis not present

## 2017-11-11 DIAGNOSIS — R6889 Other general symptoms and signs: Secondary | ICD-10-CM | POA: Diagnosis not present

## 2017-11-15 DIAGNOSIS — G620 Drug-induced polyneuropathy: Secondary | ICD-10-CM | POA: Diagnosis not present

## 2017-11-15 DIAGNOSIS — I4891 Unspecified atrial fibrillation: Secondary | ICD-10-CM | POA: Diagnosis not present

## 2017-11-15 DIAGNOSIS — N271 Small kidney, bilateral: Secondary | ICD-10-CM | POA: Diagnosis not present

## 2017-11-15 DIAGNOSIS — R9431 Abnormal electrocardiogram [ECG] [EKG]: Secondary | ICD-10-CM | POA: Diagnosis not present

## 2017-11-15 DIAGNOSIS — Z9484 Stem cells transplant status: Secondary | ICD-10-CM | POA: Diagnosis not present

## 2017-11-15 DIAGNOSIS — C9001 Multiple myeloma in remission: Secondary | ICD-10-CM | POA: Diagnosis not present

## 2017-11-15 DIAGNOSIS — E785 Hyperlipidemia, unspecified: Secondary | ICD-10-CM | POA: Diagnosis not present

## 2017-11-15 DIAGNOSIS — I491 Atrial premature depolarization: Secondary | ICD-10-CM | POA: Diagnosis not present

## 2017-11-15 DIAGNOSIS — I509 Heart failure, unspecified: Secondary | ICD-10-CM | POA: Diagnosis not present

## 2017-11-15 DIAGNOSIS — Z79899 Other long term (current) drug therapy: Secondary | ICD-10-CM | POA: Diagnosis not present

## 2017-11-15 DIAGNOSIS — I5030 Unspecified diastolic (congestive) heart failure: Secondary | ICD-10-CM | POA: Diagnosis not present

## 2017-11-15 DIAGNOSIS — D759 Disease of blood and blood-forming organs, unspecified: Secondary | ICD-10-CM | POA: Diagnosis not present

## 2017-11-15 DIAGNOSIS — E119 Type 2 diabetes mellitus without complications: Secondary | ICD-10-CM | POA: Diagnosis not present

## 2017-11-15 DIAGNOSIS — K760 Fatty (change of) liver, not elsewhere classified: Secondary | ICD-10-CM | POA: Diagnosis not present

## 2017-11-15 DIAGNOSIS — K529 Noninfective gastroenteritis and colitis, unspecified: Secondary | ICD-10-CM | POA: Diagnosis not present

## 2017-11-15 DIAGNOSIS — I5022 Chronic systolic (congestive) heart failure: Secondary | ICD-10-CM | POA: Diagnosis not present

## 2017-11-15 DIAGNOSIS — D696 Thrombocytopenia, unspecified: Secondary | ICD-10-CM | POA: Diagnosis not present

## 2017-11-15 DIAGNOSIS — D61810 Antineoplastic chemotherapy induced pancytopenia: Secondary | ICD-10-CM | POA: Diagnosis not present

## 2017-11-15 DIAGNOSIS — C9000 Multiple myeloma not having achieved remission: Secondary | ICD-10-CM | POA: Diagnosis not present

## 2017-11-15 DIAGNOSIS — N179 Acute kidney failure, unspecified: Secondary | ICD-10-CM | POA: Diagnosis not present

## 2017-11-15 DIAGNOSIS — I110 Hypertensive heart disease with heart failure: Secondary | ICD-10-CM | POA: Diagnosis not present

## 2017-11-15 DIAGNOSIS — Z452 Encounter for adjustment and management of vascular access device: Secondary | ICD-10-CM | POA: Diagnosis not present

## 2017-11-15 DIAGNOSIS — R000 Tachycardia, unspecified: Secondary | ICD-10-CM | POA: Diagnosis not present

## 2017-11-15 DIAGNOSIS — I10 Essential (primary) hypertension: Secondary | ICD-10-CM | POA: Diagnosis not present

## 2017-11-15 DIAGNOSIS — I130 Hypertensive heart and chronic kidney disease with heart failure and stage 1 through stage 4 chronic kidney disease, or unspecified chronic kidney disease: Secondary | ICD-10-CM | POA: Diagnosis not present

## 2017-11-15 DIAGNOSIS — N183 Chronic kidney disease, stage 3 (moderate): Secondary | ICD-10-CM | POA: Diagnosis not present

## 2017-11-15 DIAGNOSIS — G9340 Encephalopathy, unspecified: Secondary | ICD-10-CM | POA: Diagnosis not present

## 2017-11-15 DIAGNOSIS — I361 Nonrheumatic tricuspid (valve) insufficiency: Secondary | ICD-10-CM | POA: Diagnosis not present

## 2017-11-15 DIAGNOSIS — K1230 Oral mucositis (ulcerative), unspecified: Secondary | ICD-10-CM | POA: Diagnosis not present

## 2017-11-15 DIAGNOSIS — E861 Hypovolemia: Secondary | ICD-10-CM | POA: Diagnosis not present

## 2017-11-15 DIAGNOSIS — R197 Diarrhea, unspecified: Secondary | ICD-10-CM | POA: Diagnosis not present

## 2017-11-15 DIAGNOSIS — Z794 Long term (current) use of insulin: Secondary | ICD-10-CM | POA: Diagnosis not present

## 2017-11-15 DIAGNOSIS — E1122 Type 2 diabetes mellitus with diabetic chronic kidney disease: Secondary | ICD-10-CM | POA: Diagnosis not present

## 2017-11-15 DIAGNOSIS — N189 Chronic kidney disease, unspecified: Secondary | ICD-10-CM | POA: Diagnosis not present

## 2017-11-15 DIAGNOSIS — R6889 Other general symptoms and signs: Secondary | ICD-10-CM | POA: Diagnosis not present

## 2017-11-15 DIAGNOSIS — R0600 Dyspnea, unspecified: Secondary | ICD-10-CM | POA: Diagnosis not present

## 2017-11-15 DIAGNOSIS — K6389 Other specified diseases of intestine: Secondary | ICD-10-CM | POA: Diagnosis not present

## 2017-11-15 DIAGNOSIS — I493 Ventricular premature depolarization: Secondary | ICD-10-CM | POA: Diagnosis not present

## 2017-11-15 DIAGNOSIS — E871 Hypo-osmolality and hyponatremia: Secondary | ICD-10-CM | POA: Diagnosis not present

## 2017-11-15 DIAGNOSIS — M1711 Unilateral primary osteoarthritis, right knee: Secondary | ICD-10-CM | POA: Diagnosis not present

## 2017-11-15 DIAGNOSIS — G4733 Obstructive sleep apnea (adult) (pediatric): Secondary | ICD-10-CM | POA: Diagnosis not present

## 2017-11-15 DIAGNOSIS — M25661 Stiffness of right knee, not elsewhere classified: Secondary | ICD-10-CM | POA: Diagnosis not present

## 2017-11-15 DIAGNOSIS — I480 Paroxysmal atrial fibrillation: Secondary | ICD-10-CM | POA: Diagnosis not present

## 2017-11-15 DIAGNOSIS — K5900 Constipation, unspecified: Secondary | ICD-10-CM | POA: Diagnosis not present

## 2017-11-15 DIAGNOSIS — M25561 Pain in right knee: Secondary | ICD-10-CM | POA: Diagnosis not present

## 2017-11-17 HISTORY — PX: LIMBAL STEM CELL TRANSPLANT: SHX1969

## 2017-12-07 DIAGNOSIS — R6889 Other general symptoms and signs: Secondary | ICD-10-CM | POA: Diagnosis not present

## 2017-12-08 DIAGNOSIS — Z9484 Stem cells transplant status: Secondary | ICD-10-CM | POA: Diagnosis not present

## 2017-12-08 DIAGNOSIS — C9000 Multiple myeloma not having achieved remission: Secondary | ICD-10-CM | POA: Diagnosis not present

## 2017-12-08 DIAGNOSIS — R6889 Other general symptoms and signs: Secondary | ICD-10-CM | POA: Diagnosis not present

## 2017-12-09 DIAGNOSIS — R6889 Other general symptoms and signs: Secondary | ICD-10-CM | POA: Diagnosis not present

## 2017-12-10 DIAGNOSIS — R6889 Other general symptoms and signs: Secondary | ICD-10-CM | POA: Diagnosis not present

## 2017-12-11 DIAGNOSIS — R6889 Other general symptoms and signs: Secondary | ICD-10-CM | POA: Diagnosis not present

## 2017-12-12 DIAGNOSIS — Z9481 Bone marrow transplant status: Secondary | ICD-10-CM | POA: Diagnosis not present

## 2017-12-12 DIAGNOSIS — C9000 Multiple myeloma not having achieved remission: Secondary | ICD-10-CM | POA: Diagnosis not present

## 2017-12-12 DIAGNOSIS — R6889 Other general symptoms and signs: Secondary | ICD-10-CM | POA: Diagnosis not present

## 2017-12-13 DIAGNOSIS — R6889 Other general symptoms and signs: Secondary | ICD-10-CM | POA: Diagnosis not present

## 2017-12-14 DIAGNOSIS — R6889 Other general symptoms and signs: Secondary | ICD-10-CM | POA: Diagnosis not present

## 2017-12-15 DIAGNOSIS — C9000 Multiple myeloma not having achieved remission: Secondary | ICD-10-CM | POA: Diagnosis not present

## 2017-12-15 DIAGNOSIS — Z9484 Stem cells transplant status: Secondary | ICD-10-CM | POA: Diagnosis not present

## 2017-12-15 DIAGNOSIS — R6889 Other general symptoms and signs: Secondary | ICD-10-CM | POA: Diagnosis not present

## 2017-12-19 DIAGNOSIS — C9000 Multiple myeloma not having achieved remission: Secondary | ICD-10-CM | POA: Diagnosis not present

## 2017-12-19 DIAGNOSIS — Z9481 Bone marrow transplant status: Secondary | ICD-10-CM | POA: Diagnosis not present

## 2017-12-19 DIAGNOSIS — R6889 Other general symptoms and signs: Secondary | ICD-10-CM | POA: Diagnosis not present

## 2017-12-26 DIAGNOSIS — N183 Chronic kidney disease, stage 3 (moderate): Secondary | ICD-10-CM | POA: Diagnosis not present

## 2017-12-26 DIAGNOSIS — E039 Hypothyroidism, unspecified: Secondary | ICD-10-CM | POA: Diagnosis not present

## 2017-12-26 DIAGNOSIS — C9002 Multiple myeloma in relapse: Secondary | ICD-10-CM | POA: Diagnosis not present

## 2017-12-26 DIAGNOSIS — E118 Type 2 diabetes mellitus with unspecified complications: Secondary | ICD-10-CM | POA: Diagnosis not present

## 2017-12-27 DIAGNOSIS — C9000 Multiple myeloma not having achieved remission: Secondary | ICD-10-CM | POA: Diagnosis not present

## 2018-01-12 DIAGNOSIS — I509 Heart failure, unspecified: Secondary | ICD-10-CM | POA: Diagnosis not present

## 2018-01-12 DIAGNOSIS — Z9481 Bone marrow transplant status: Secondary | ICD-10-CM | POA: Diagnosis not present

## 2018-01-12 DIAGNOSIS — E119 Type 2 diabetes mellitus without complications: Secondary | ICD-10-CM | POA: Diagnosis not present

## 2018-01-12 DIAGNOSIS — R6889 Other general symptoms and signs: Secondary | ICD-10-CM | POA: Diagnosis not present

## 2018-01-12 DIAGNOSIS — C9000 Multiple myeloma not having achieved remission: Secondary | ICD-10-CM | POA: Diagnosis not present

## 2018-01-12 DIAGNOSIS — I480 Paroxysmal atrial fibrillation: Secondary | ICD-10-CM | POA: Diagnosis not present

## 2018-01-12 DIAGNOSIS — Z539 Procedure and treatment not carried out, unspecified reason: Secondary | ICD-10-CM | POA: Diagnosis not present

## 2018-01-23 DIAGNOSIS — G4733 Obstructive sleep apnea (adult) (pediatric): Secondary | ICD-10-CM | POA: Diagnosis not present

## 2018-01-26 ENCOUNTER — Encounter: Payer: Self-pay | Admitting: Cardiology

## 2018-01-26 ENCOUNTER — Ambulatory Visit (INDEPENDENT_AMBULATORY_CARE_PROVIDER_SITE_OTHER): Payer: Medicare Other | Admitting: Cardiology

## 2018-01-26 VITALS — BP 122/82 | HR 67 | Ht 63.0 in | Wt 313.0 lb

## 2018-01-26 DIAGNOSIS — I4891 Unspecified atrial fibrillation: Secondary | ICD-10-CM | POA: Diagnosis not present

## 2018-01-26 DIAGNOSIS — I10 Essential (primary) hypertension: Secondary | ICD-10-CM | POA: Diagnosis not present

## 2018-01-26 DIAGNOSIS — I5022 Chronic systolic (congestive) heart failure: Secondary | ICD-10-CM

## 2018-01-26 NOTE — Patient Instructions (Signed)
Medication Instructions:  Your physician recommends that you continue on your current medications as directed. Please refer to the Current Medication list given to you today.   Labwork: NONE  Testing/Procedures: NONE  Follow-Up: Your physician recommends that you schedule a follow-up appointment in: 4 MONTHS    Any Other Special Instructions Will Be Listed Below (If Applicable). WE WILL REQUEST YOUR CARDIAC RECORDS FROM [facility]    If you need a refill on your cardiac medications before your next appointment, please call your pharmacy.

## 2018-01-26 NOTE — Progress Notes (Signed)
Clinical Summary [name] is a 72 y.o.female seen today for follow up of the following medical problems.   1. NICM  - echo 07/2013 LVEF 40-45%, mod to severe LVH.  - previously had ICD, had troubles with lead dislodgment and inappropriate shocks in the past and device was removed. Her LVEF has improved and she has not required consideration for another device.   04/2017 echo LVEF 16-10%, diastolic function not reported.   - taking lasix just '20mg'$  prn. Seems to be controlling her edema. Weight is down from 334 lbs in Feb to 313 lbs today  2. HTN  - she is compliant with meds   3. Multiple myeloma - followed by Dr Tressie Stalker in [place] and Dr Melba Coon at [facility].  - recent bone marrow transplant in [place].   4. CKD - followed by renal Dr Hinda Lenis   5. OSA -followed by DrHawkins -started CPAP machine.  -   6. Afib - new diagnosis during admission in [place] for bone marrow transplant, awaiting records. Could tell she was out of rhythm.  - started on eliquis.  - mild recent  palpitations.     Past Medical History:  Diagnosis Date  . Anemia    takes Ferrous Fumarate 2 times week  . Arthritis    ankles  . Blood transfusion    hx of-83yr ago  . Cardiomyopathy secondary   . Cataract    bilateral and immature  . [place]    takes Lasix prn, Echo 2015 with EF 40%  . Chronic back pain   . Diabetes mellitus    takes Glipizide and Metformin daily  . Dysrhythmia    pt takes Digoxin and Carvedilol daily  . GERD (gastroesophageal reflux disease)    takes Protonix seldom  . Gout    hx of x 1 time in Aug 2012  . H/O hiatal hernia   . Hemorrhoid   . Hypertension    takes Diovan and Spirololactone daily  . Left leg pain   . Multiple myeloma (HCC)    [place]A [place]; [place] . Overweight(278.02)   . Peripheral neuropathy   . Pneumonia    hx of;many yrs ago  . Sleep apnea    doesn't  use CPAP  . Systolic heart failure    Chronic  . Thyroid nodule    right     Allergies  Allergen Reactions  . Doxycycline Rash     Current Outpatient Medications  Medication Sig Dispense Refill  . aspirin EC 81 MG tablet Take 81 mg by mouth every evening.    .Marland Kitchenatorvastatin (LIPITOR) 40 MG tablet Take 40 mg by mouth daily.     . carvedilol (COREG) 25 MG tablet Take 25 mg by mouth 2 (two) times daily with a meal.    . CINNAMON PO Take 2 capsules by mouth every morning.     .Marland KitchenDARATUMUMAB IV Inject into the vein every Thursday.    . ferrous sulfate 325 (65 FE) MG tablet Take 325 mg by mouth daily.     . Flaxseed, Linseed, (FLAX SEED OIL PO) Take 1 capsule by mouth every morning.     . furosemide (LASIX) 20 MG tablet Take 40 mg In the Morning and 20 mg In the Evening 135 tablet 3  . HUMALOG KWIKPEN 100 UNIT/ML KiwkPen Take 20-25 Units by mouth 3 (three) times daily. As directed per sliding scale. To take  25 units upon taking Steroid regimen prior to scheduled treatments    . levothyroxine (SYNTHROID, LEVOTHROID) 100 MCG tablet Take 100 mcg by mouth daily before breakfast.    . montelukast (SINGULAIR) 10 MG tablet Take 10 mg by mouth See admin instructions. Take one tablet on the day before, the day of, and the day after treatments. Treatments are taken on Thursdays of each week    . pomalidomide (POMALYST) 2 MG capsule Take by mouth.    . prochlorperazine (COMPAZINE) 10 MG tablet Take 10 mg by mouth every 6 (six) hours as needed for nausea or vomiting.     [name] spironolactone (ALDACTONE) 25 MG tablet Take 0.5 tablets (12.5 mg total) by mouth daily. 45 tablet 3  . tetrahydrozoline (EYE DROPS) 0.05 % ophthalmic solution Place 1 drop into both eyes daily as needed. For dry eye relief     No current facility-administered medications for this visit.      Past Surgical History:  Procedure Laterality Date  . ABDOMINAL HYSTERECTOMY  1991  . APPENDECTOMY  1991  . CARDIAC DEFIBRILLATOR  PLACEMENT  >40yr ago  . Cardiac defirillator removed     removed <352monthremoved d/t shocking pt 15times;pierced sac of heart 1000cc ;drained  . CHOLECYSTECTOMY OPEN  1992  . COLONOSCOPY    . COLONOSCOPY N/A 02/08/2014   slf: 1. NO SOURCE FOR ANEMIA IDENTIFIED. 2. SINGLE polyp removed 3. Moderate divertculosis in the sigmoid colon and descencing colon. 4. Moderate sized internal hemorrhoids.   . Marland Kitchenefibrillator removed    . ESOPHAGOGASTRODUODENOSCOPY N/A 02/08/2014   SLF: No source for anemia identified 2. Black foreign body in cardia 3. Mild erosive gastriitis (inflammaion0 was found in the gastric antrum; multiple biopsies were performed.   . leg lipoma    . ovarian cyst removed  1991  . stomach stapling surgery  80's  . THYROID LOBECTOMY  07/27/2011   Procedure: THYROID LOBECTOMY;  Surgeon: ErGayland CurryMD,FACS;  [place] Service: General;  Laterality: Right;  . TUBAL LIGATION  1971  . tummy tuck  1991     Allergies  Allergen Reactions  . Doxycycline Rash      Family History  Problem Relation Age of Onset  . Kidney failure Mother   . Heart failure Other   . Anesthesia problems Neg Hx   . Hypotension Neg Hx   . Malignant hyperthermia Neg Hx   . Pseudochol deficiency Neg Hx      Social History Ms. LiKinnamoneports that she has never smoked. She has never used smokeless tobacco. [name] that she drinks alcohol.   Review of Systems CONSTITUTIONAL: No weight loss, fever, chills, weakness or fatigue.  HEENT: Eyes: No visual loss, blurred vision, double vision or yellow sclerae.No hearing loss, sneezing, congestion, runny nose or sore throat.  SKIN: No rash or itching.  CARDIOVASCULAR: per hpi RESPIRATORY: No shortness of breath, cough or sputum.  GASTROINTESTINAL: No anorexia, nausea, vomiting or diarrhea. No abdominal pain or blood.  GENITOURINARY: No burning on urination, no polyuria NEUROLOGICAL: No headache, dizziness, syncope, paralysis,  ataxia, numbness or tingling in the extremities. No change in bowel or bladder control.  MUSCULOSKELETAL: No muscle, back pain, joint pain or stiffness.  LYMPHATICS: No enlarged nodes. No history of splenectomy.  PSYCHIATRIC: No history of depression or anxiety.  ENDOCRINOLOGIC: No reports of sweating, cold or heat intolerance. No polyuria or polydipsia.  . [name] Physical Examination Vitals:   01/26/18 1036  BP: 122/82  Pulse:  67  SpO2: 97%   Vitals:   01/26/18 1036  Weight: (!) 313 lb (142 kg)  Height: '5\' 3"'$  (1.6 m)    Gen: resting comfortably, no acute distress HEENT: no scleral icterus, pupils equal round and reactive, no palptable cervical adenopathy,  CV: RRR, no mrg, no jvd Resp: Clear to auscultation bilaterally GI: abdomen is soft, non-tender, non-distended, normal bowel sounds, no hepatosplenomegaly MSK: extremities are warm, no edema.  Skin: warm, no rash Neuro:  no focal deficits Psych: appropriate affect   Diagnostic Studies  08/07/13 Echo Study Conclusions  - Procedure narrative: Transthoracic echocardiography. Image quality was suboptimal. The study was technically difficult, as a result of poor sound wave transmission and body habitus. - Left ventricle: The cavity size was normal. Wall thickness was increased in a pattern of moderate to severe LVH. Systolic function was mildly to moderately reduced. The estimated ejection fraction was in the range of 40% to 45%. Mild to moderate global hypokinesis was seen. There was an increased relative contribution of atrial contraction to ventricular filling. Doppler parameters are consistent with abnormal left ventricular relaxation (grade 1 diastolic dysfunction). - Aortic valve: Mildly calcified annulus. Trileaflet. - Left atrium: The atrium was mildly dilated. - Right atrium: The atrium was mildly dilated.  07/2013 Event Monitor NSR, frequent PVCs   04/2017 echo Study Conclusions  - Left ventricle: The  cavity size was normal. Wall thickness was   normal. Systolic function was normal. The estimated ejection   fraction was in the range of 50% to 55%. - Left atrium: The atrium was mildly dilated. - Pulmonary arteries: PA peak pressure: 33 mm Hg (S). - Impressions: Poor acoustic windows limit study Cannot evaluate   regional wall motion.  Impressions:  - Poor acoustic windows limit study Cannot evaluate regional wall   motion.  Assessment and Plan   1. NICM/Chronic systolic heart failure - LVEF has since normalized - apepars euvolemic, continue current meds  2. HTN -at goal, continue current meds  3. Afib - new diagnosis during recent admission in [place], requestin records - continue beta blocker and eliquis at this time - no recurrent symptoms - EKG today shows sinus rhtyhm    f/u 4 months. Request records from [facility] in [place], [place]     Arnoldo Lenis, M.D.

## 2018-02-07 DIAGNOSIS — C9000 Multiple myeloma not having achieved remission: Secondary | ICD-10-CM | POA: Diagnosis not present

## 2018-02-08 ENCOUNTER — Ambulatory Visit (INDEPENDENT_AMBULATORY_CARE_PROVIDER_SITE_OTHER): Payer: Medicare Other | Admitting: Podiatry

## 2018-02-08 ENCOUNTER — Encounter: Payer: Self-pay | Admitting: Podiatry

## 2018-02-08 DIAGNOSIS — L84 Corns and callosities: Secondary | ICD-10-CM

## 2018-02-08 DIAGNOSIS — B351 Tinea unguium: Secondary | ICD-10-CM

## 2018-02-08 DIAGNOSIS — M79674 Pain in right toe(s): Secondary | ICD-10-CM | POA: Diagnosis not present

## 2018-02-08 DIAGNOSIS — E1142 Type 2 diabetes mellitus with diabetic polyneuropathy: Secondary | ICD-10-CM | POA: Diagnosis not present

## 2018-02-08 DIAGNOSIS — Q6650 Congenital pes planus, unspecified foot: Secondary | ICD-10-CM

## 2018-02-08 DIAGNOSIS — M79675 Pain in left toe(s): Secondary | ICD-10-CM

## 2018-02-08 NOTE — Progress Notes (Signed)
This patient presents the office today for preventative foot care services. She states her nails have grown thick and long and are causing pain and discomfort walking and wearing her shoes. She also admits the callus is no longer painful. She admits to being a diabetic and having neuropathy in her feet. She does admit that part of the neuropathy is due to the side effects of her multiple myeloma medication.. She presents the office today for an evaluation and treatment of her feet.    Physical examination.  DP pulses palpable  B/L.  PT pulses non palpable due to swelling  Capillary return within normal limits. Temperature gradient within normal limits   Neurologic  Diminished semmes -weinstein test.  Musculoskeletal.  Hammertoes are present. Muscle power is within normal limits. No pain crepitus or limitation of motion in the feet noted.  Pes planus foot type. Pain 1st MPJ right greater than left.  Palpable pain at the insertion plantar fascia left foot.  NAILS  thick disfigured discolored nails with subungual debris noted  SKIN  Callus plantar aspect left hallux.    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    Debridement of callus.  [name] RTC 3 months for preventive footcare services.     Gardiner Barefoot DPM

## 2018-02-09 DIAGNOSIS — I10 Essential (primary) hypertension: Secondary | ICD-10-CM | POA: Diagnosis not present

## 2018-02-09 DIAGNOSIS — R897 Abnormal histological findings in specimens from other organs, systems and tissues: Secondary | ICD-10-CM | POA: Diagnosis not present

## 2018-02-09 DIAGNOSIS — Z794 Long term (current) use of insulin: Secondary | ICD-10-CM | POA: Diagnosis not present

## 2018-02-09 DIAGNOSIS — Z9481 Bone marrow transplant status: Secondary | ICD-10-CM | POA: Diagnosis not present

## 2018-02-09 DIAGNOSIS — C9000 Multiple myeloma not having achieved remission: Secondary | ICD-10-CM | POA: Diagnosis not present

## 2018-02-09 DIAGNOSIS — Z7901 Long term (current) use of anticoagulants: Secondary | ICD-10-CM | POA: Diagnosis not present

## 2018-02-09 DIAGNOSIS — R6889 Other general symptoms and signs: Secondary | ICD-10-CM | POA: Diagnosis not present

## 2018-02-09 DIAGNOSIS — D472 Monoclonal gammopathy: Secondary | ICD-10-CM | POA: Diagnosis not present

## 2018-02-09 DIAGNOSIS — D61818 Other pancytopenia: Secondary | ICD-10-CM | POA: Diagnosis not present

## 2018-02-09 DIAGNOSIS — E119 Type 2 diabetes mellitus without complications: Secondary | ICD-10-CM | POA: Diagnosis not present

## 2018-02-15 DIAGNOSIS — Z79899 Other long term (current) drug therapy: Secondary | ICD-10-CM | POA: Diagnosis not present

## 2018-02-15 DIAGNOSIS — N183 Chronic kidney disease, stage 3 (moderate): Secondary | ICD-10-CM | POA: Diagnosis not present

## 2018-02-15 DIAGNOSIS — E559 Vitamin D deficiency, unspecified: Secondary | ICD-10-CM | POA: Diagnosis not present

## 2018-02-15 DIAGNOSIS — R809 Proteinuria, unspecified: Secondary | ICD-10-CM | POA: Diagnosis not present

## 2018-02-15 DIAGNOSIS — I10 Essential (primary) hypertension: Secondary | ICD-10-CM | POA: Diagnosis not present

## 2018-02-15 DIAGNOSIS — D509 Iron deficiency anemia, unspecified: Secondary | ICD-10-CM | POA: Diagnosis not present

## 2018-02-23 DIAGNOSIS — R809 Proteinuria, unspecified: Secondary | ICD-10-CM | POA: Diagnosis not present

## 2018-02-23 DIAGNOSIS — N183 Chronic kidney disease, stage 3 (moderate): Secondary | ICD-10-CM | POA: Diagnosis not present

## 2018-02-23 DIAGNOSIS — D649 Anemia, unspecified: Secondary | ICD-10-CM | POA: Diagnosis not present

## 2018-02-23 DIAGNOSIS — I10 Essential (primary) hypertension: Secondary | ICD-10-CM | POA: Diagnosis not present

## 2018-02-23 DIAGNOSIS — C9000 Multiple myeloma not having achieved remission: Secondary | ICD-10-CM | POA: Diagnosis not present

## 2018-03-07 DIAGNOSIS — R609 Edema, unspecified: Secondary | ICD-10-CM | POA: Diagnosis not present

## 2018-03-07 DIAGNOSIS — E890 Postprocedural hypothyroidism: Secondary | ICD-10-CM | POA: Diagnosis not present

## 2018-03-07 DIAGNOSIS — E11319 Type 2 diabetes mellitus with unspecified diabetic retinopathy without macular edema: Secondary | ICD-10-CM | POA: Diagnosis not present

## 2018-03-07 DIAGNOSIS — E113219 Type 2 diabetes mellitus with mild nonproliferative diabetic retinopathy with macular edema, unspecified eye: Secondary | ICD-10-CM | POA: Diagnosis not present

## 2018-03-13 ENCOUNTER — Other Ambulatory Visit: Payer: Self-pay | Admitting: Adult Health

## 2018-03-21 DIAGNOSIS — C9000 Multiple myeloma not having achieved remission: Secondary | ICD-10-CM | POA: Diagnosis not present

## 2018-03-31 ENCOUNTER — Encounter (HOSPITAL_COMMUNITY): Payer: Self-pay | Admitting: *Deleted

## 2018-03-31 ENCOUNTER — Inpatient Hospital Stay (HOSPITAL_COMMUNITY)
Admission: EM | Admit: 2018-03-31 | Discharge: 2018-04-04 | DRG: 193 | Disposition: A | Discharge status: Home Health | Payer: Medicare Other | Attending: Family Medicine | Admitting: Family Medicine

## 2018-03-31 ENCOUNTER — Other Ambulatory Visit: Payer: Self-pay

## 2018-03-31 ENCOUNTER — Emergency Department (HOSPITAL_COMMUNITY): Payer: Medicare Other

## 2018-03-31 DIAGNOSIS — T451X5A Adverse effect of antineoplastic and immunosuppressive drugs, initial encounter: Secondary | ICD-10-CM | POA: Diagnosis not present

## 2018-03-31 DIAGNOSIS — N183 Chronic kidney disease, stage 3 unspecified: Secondary | ICD-10-CM | POA: Diagnosis present

## 2018-03-31 DIAGNOSIS — E875 Hyperkalemia: Secondary | ICD-10-CM | POA: Diagnosis present

## 2018-03-31 DIAGNOSIS — Z95810 Presence of automatic (implantable) cardiac defibrillator: Secondary | ICD-10-CM

## 2018-03-31 DIAGNOSIS — D630 Anemia in neoplastic disease: Secondary | ICD-10-CM | POA: Diagnosis present

## 2018-03-31 DIAGNOSIS — J9601 Acute respiratory failure with hypoxia: Secondary | ICD-10-CM | POA: Diagnosis not present

## 2018-03-31 DIAGNOSIS — I428 Other cardiomyopathies: Secondary | ICD-10-CM | POA: Diagnosis present

## 2018-03-31 DIAGNOSIS — I480 Paroxysmal atrial fibrillation: Secondary | ICD-10-CM | POA: Diagnosis not present

## 2018-03-31 DIAGNOSIS — Z79890 Hormone replacement therapy (postmenopausal): Secondary | ICD-10-CM

## 2018-03-31 DIAGNOSIS — E039 Hypothyroidism, unspecified: Secondary | ICD-10-CM | POA: Diagnosis not present

## 2018-03-31 DIAGNOSIS — D6481 Anemia due to antineoplastic chemotherapy: Secondary | ICD-10-CM | POA: Diagnosis present

## 2018-03-31 DIAGNOSIS — Z7901 Long term (current) use of anticoagulants: Secondary | ICD-10-CM

## 2018-03-31 DIAGNOSIS — E785 Hyperlipidemia, unspecified: Secondary | ICD-10-CM | POA: Diagnosis not present

## 2018-03-31 DIAGNOSIS — E1142 Type 2 diabetes mellitus with diabetic polyneuropathy: Secondary | ICD-10-CM | POA: Diagnosis not present

## 2018-03-31 DIAGNOSIS — I5022 Chronic systolic (congestive) heart failure: Secondary | ICD-10-CM | POA: Diagnosis not present

## 2018-03-31 DIAGNOSIS — C9000 Multiple myeloma not having achieved remission: Secondary | ICD-10-CM | POA: Diagnosis present

## 2018-03-31 DIAGNOSIS — C9001 Multiple myeloma in remission: Secondary | ICD-10-CM | POA: Diagnosis not present

## 2018-03-31 DIAGNOSIS — Z90710 Acquired absence of both cervix and uterus: Secondary | ICD-10-CM | POA: Diagnosis not present

## 2018-03-31 DIAGNOSIS — E118 Type 2 diabetes mellitus with unspecified complications: Secondary | ICD-10-CM | POA: Diagnosis not present

## 2018-03-31 DIAGNOSIS — R0602 Shortness of breath: Secondary | ICD-10-CM | POA: Diagnosis not present

## 2018-03-31 DIAGNOSIS — J189 Pneumonia, unspecified organism: Secondary | ICD-10-CM | POA: Diagnosis not present

## 2018-03-31 DIAGNOSIS — I130 Hypertensive heart and chronic kidney disease with heart failure and stage 1 through stage 4 chronic kidney disease, or unspecified chronic kidney disease: Secondary | ICD-10-CM | POA: Diagnosis not present

## 2018-03-31 DIAGNOSIS — E1122 Type 2 diabetes mellitus with diabetic chronic kidney disease: Secondary | ICD-10-CM | POA: Diagnosis present

## 2018-03-31 DIAGNOSIS — K219 Gastro-esophageal reflux disease without esophagitis: Secondary | ICD-10-CM | POA: Diagnosis present

## 2018-03-31 DIAGNOSIS — Z8249 Family history of ischemic heart disease and other diseases of the circulatory system: Secondary | ICD-10-CM | POA: Diagnosis not present

## 2018-03-31 DIAGNOSIS — R531 Weakness: Secondary | ICD-10-CM | POA: Diagnosis not present

## 2018-03-31 DIAGNOSIS — E1149 Type 2 diabetes mellitus with other diabetic neurological complication: Secondary | ICD-10-CM | POA: Diagnosis present

## 2018-03-31 DIAGNOSIS — R05 Cough: Secondary | ICD-10-CM | POA: Diagnosis not present

## 2018-03-31 DIAGNOSIS — Z6841 Body Mass Index (BMI) 40.0 and over, adult: Secondary | ICD-10-CM | POA: Diagnosis not present

## 2018-03-31 DIAGNOSIS — Z794 Long term (current) use of insulin: Secondary | ICD-10-CM | POA: Diagnosis not present

## 2018-03-31 DIAGNOSIS — I10 Essential (primary) hypertension: Secondary | ICD-10-CM | POA: Diagnosis not present

## 2018-03-31 DIAGNOSIS — Z79899 Other long term (current) drug therapy: Secondary | ICD-10-CM

## 2018-03-31 DIAGNOSIS — J9600 Acute respiratory failure, unspecified whether with hypoxia or hypercapnia: Secondary | ICD-10-CM | POA: Diagnosis present

## 2018-03-31 DIAGNOSIS — Z9049 Acquired absence of other specified parts of digestive tract: Secondary | ICD-10-CM

## 2018-03-31 DIAGNOSIS — J181 Lobar pneumonia, unspecified organism: Secondary | ICD-10-CM

## 2018-03-31 DIAGNOSIS — R0600 Dyspnea, unspecified: Secondary | ICD-10-CM | POA: Diagnosis not present

## 2018-03-31 DIAGNOSIS — E669 Obesity, unspecified: Secondary | ICD-10-CM | POA: Diagnosis present

## 2018-03-31 LAB — CBC WITH DIFFERENTIAL/PLATELET
ABS IMMATURE GRANULOCYTES: 0.01 10*3/uL (ref 0.00–0.07)
Basophils Absolute: 0 10*3/uL (ref 0.0–0.1)
Basophils Relative: 1 %
Eosinophils Absolute: 0.1 10*3/uL (ref 0.0–0.5)
Eosinophils Relative: 3 %
HCT: 32.4 % — ABNORMAL LOW (ref 36.0–46.0)
HEMOGLOBIN: 9.4 g/dL — AB (ref 12.0–15.0)
IMMATURE GRANULOCYTES: 1 %
LYMPHS PCT: 36 %
Lymphs Abs: 0.8 10*3/uL (ref 0.7–4.0)
MCH: 24.5 pg — AB (ref 26.0–34.0)
MCHC: 29 g/dL — AB (ref 30.0–36.0)
MCV: 84.4 fL (ref 80.0–100.0)
MONO ABS: 0.2 10*3/uL (ref 0.1–1.0)
MONOS PCT: 10 %
NEUTROS ABS: 1.1 10*3/uL — AB (ref 1.7–7.7)
NEUTROS PCT: 49 %
Platelets: 168 10*3/uL (ref 150–400)
RBC: 3.84 MIL/uL — ABNORMAL LOW (ref 3.87–5.11)
RDW: 15.9 % — AB (ref 11.5–15.5)
WBC: 2.2 10*3/uL — ABNORMAL LOW (ref 4.0–10.5)
nRBC: 0 % (ref 0.0–0.2)

## 2018-03-31 LAB — COMPREHENSIVE METABOLIC PANEL
ALBUMIN: 2.7 g/dL — AB (ref 3.5–5.0)
ALT: 30 U/L (ref 0–44)
ANION GAP: 12 (ref 5–15)
AST: 22 U/L (ref 15–41)
Alkaline Phosphatase: 189 U/L — ABNORMAL HIGH (ref 38–126)
BUN: 18 mg/dL (ref 8–23)
CHLORIDE: 102 mmol/L (ref 98–111)
CO2: 22 mmol/L (ref 22–32)
Calcium: 8.7 mg/dL — ABNORMAL LOW (ref 8.9–10.3)
Creatinine, Ser: 1.21 mg/dL — ABNORMAL HIGH (ref 0.44–1.00)
GFR calc Af Amer: 52 mL/min — ABNORMAL LOW (ref >60)
GFR calc non Af Amer: 45 mL/min — ABNORMAL LOW (ref >60)
GLUCOSE: 161 mg/dL — AB (ref 70–99)
POTASSIUM: 3.5 mmol/L (ref 3.5–5.1)
SODIUM: 136 mmol/L (ref 135–145)
Total Bilirubin: 0.6 mg/dL (ref 0.3–1.2)
Total Protein: 6.3 g/dL — ABNORMAL LOW (ref 6.5–8.1)

## 2018-03-31 LAB — BRAIN NATRIURETIC PEPTIDE: B NATRIURETIC PEPTIDE 5: 510 pg/mL — AB (ref 0.0–100.0)

## 2018-03-31 LAB — TROPONIN I: Troponin I: <0.03 ng/mL (ref <0.03)

## 2018-03-31 LAB — CBG MONITORING, ED: Glucose-Capillary: 169 mg/dL — ABNORMAL HIGH (ref 70–99)

## 2018-03-31 LAB — GLUCOSE, CAPILLARY: Glucose-Capillary: 202 mg/dL — ABNORMAL HIGH (ref 70–99)

## 2018-03-31 MED ORDER — IPRATROPIUM BROMIDE 0.02 % IN SOLN
0.5000 mg | Freq: Once | RESPIRATORY_TRACT | Status: AC
  Administered 2018-03-31: 0.5 mg via RESPIRATORY_TRACT
  Filled 2018-03-31: qty 2.5

## 2018-03-31 MED ORDER — SODIUM CHLORIDE 0.9 % IV BOLUS
500.0000 mL | Freq: Once | INTRAVENOUS | Status: AC
  Administered 2018-03-31: 500 mL via INTRAVENOUS

## 2018-03-31 MED ORDER — SODIUM CHLORIDE 0.9 % IV SOLN
500.0000 mg | Freq: Once | INTRAVENOUS | Status: AC
  Administered 2018-03-31: 500 mg via INTRAVENOUS
  Filled 2018-03-31: qty 500

## 2018-03-31 MED ORDER — ALBUTEROL (5 MG/ML) CONTINUOUS INHALATION SOLN
10.0000 mg/h | INHALATION_SOLUTION | RESPIRATORY_TRACT | Status: AC
  Administered 2018-03-31: 10 mg/h via RESPIRATORY_TRACT
  Filled 2018-03-31: qty 20

## 2018-03-31 MED ORDER — SODIUM CHLORIDE 0.9 % IV SOLN
INTRAVENOUS | Status: DC
  Administered 2018-03-31: 18:00:00 via INTRAVENOUS

## 2018-03-31 MED ORDER — METOPROLOL TARTRATE 25 MG PO TABS
25.0000 mg | ORAL_TABLET | Freq: Two times a day (BID) | ORAL | Status: DC
  Administered 2018-04-01 – 2018-04-02 (×4): 25 mg via ORAL
  Filled 2018-03-31 (×8): qty 1

## 2018-03-31 MED ORDER — ATORVASTATIN CALCIUM 40 MG PO TABS
40.0000 mg | ORAL_TABLET | Freq: Every day | ORAL | Status: DC
  Administered 2018-04-01 – 2018-04-04 (×4): 40 mg via ORAL
  Filled 2018-03-31 (×4): qty 1

## 2018-03-31 MED ORDER — SODIUM CHLORIDE 0.9 % IV SOLN
1.0000 g | Freq: Once | INTRAVENOUS | Status: AC
  Administered 2018-03-31: 1 g via INTRAVENOUS
  Filled 2018-03-31: qty 10

## 2018-03-31 MED ORDER — GUAIFENESIN ER 600 MG PO TB12
1200.0000 mg | ORAL_TABLET | Freq: Two times a day (BID) | ORAL | Status: DC
  Administered 2018-04-01 – 2018-04-04 (×8): 1200 mg via ORAL
  Filled 2018-03-31 (×8): qty 2

## 2018-03-31 MED ORDER — APIXABAN 5 MG PO TABS
5.0000 mg | ORAL_TABLET | Freq: Two times a day (BID) | ORAL | Status: DC
  Administered 2018-04-01 – 2018-04-04 (×8): 5 mg via ORAL
  Filled 2018-03-31 (×8): qty 1

## 2018-03-31 MED ORDER — ACETAMINOPHEN 325 MG PO TABS: 650.0000 mg | ORAL_TABLET | Freq: Four times a day (QID) | ORAL | Status: DC | PRN

## 2018-03-31 MED ORDER — LEVOTHYROXINE SODIUM 100 MCG PO TABS
100.0000 ug | ORAL_TABLET | Freq: Every day | ORAL | Status: DC
  Administered 2018-04-01 – 2018-04-04 (×4): 100 ug via ORAL
  Filled 2018-03-31 (×4): qty 1

## 2018-03-31 MED ORDER — FUROSEMIDE 20 MG PO TABS
20.0000 mg | ORAL_TABLET | Freq: Two times a day (BID) | ORAL | Status: DC
  Administered 2018-04-01: 20 mg via ORAL
  Administered 2018-04-01 – 2018-04-02 (×2): 40 mg via ORAL
  Administered 2018-04-02 – 2018-04-03 (×2): 20 mg via ORAL
  Administered 2018-04-03: 40 mg via ORAL
  Administered 2018-04-04: 20 mg via ORAL
  Filled 2018-03-31 (×2): qty 1
  Filled 2018-03-31: qty 2
  Filled 2018-03-31 (×4): qty 1
  Filled 2018-03-31: qty 2

## 2018-03-31 MED ORDER — IPRATROPIUM-ALBUTEROL 0.5-2.5 (3) MG/3ML IN SOLN
3.0000 mL | Freq: Four times a day (QID) | RESPIRATORY_TRACT | Status: DC
  Administered 2018-04-01 – 2018-04-04 (×15): 3 mL via RESPIRATORY_TRACT
  Filled 2018-03-31 (×15): qty 3

## 2018-03-31 MED ORDER — SODIUM CHLORIDE 0.9 % IV SOLN
500.0000 mg | INTRAVENOUS | Status: DC
  Administered 2018-04-01 – 2018-04-03 (×3): 500 mg via INTRAVENOUS
  Filled 2018-03-31 (×5): qty 500

## 2018-03-31 MED ORDER — METHYLPREDNISOLONE SODIUM SUCC 125 MG IJ SOLR
60.0000 mg | Freq: Two times a day (BID) | INTRAMUSCULAR | Status: DC
  Administered 2018-04-01 – 2018-04-04 (×8): 60 mg via INTRAVENOUS
  Filled 2018-03-31 (×8): qty 2

## 2018-03-31 MED ORDER — INSULIN ASPART 100 UNIT/ML ~~LOC~~ SOLN
0.0000 [IU] | Freq: Every day | SUBCUTANEOUS | Status: DC
  Administered 2018-04-01: 2 [IU] via SUBCUTANEOUS

## 2018-03-31 MED ORDER — POTASSIUM CHLORIDE CRYS ER 20 MEQ PO TBCR
20.0000 meq | EXTENDED_RELEASE_TABLET | Freq: Two times a day (BID) | ORAL | Status: DC
  Administered 2018-04-01 – 2018-04-02 (×5): 20 meq via ORAL
  Filled 2018-03-31 (×5): qty 1

## 2018-03-31 MED ORDER — SODIUM CHLORIDE 0.9 % IV SOLN: 250.0000 mL | INTRAVENOUS | Status: DC | PRN

## 2018-03-31 MED ORDER — AMIODARONE HCL IN DEXTROSE 360-4.14 MG/200ML-% IV SOLN
60.0000 mg/h | INTRAVENOUS | Status: DC
  Administered 2018-03-31: 60 mg/h via INTRAVENOUS
  Filled 2018-03-31 (×2): qty 200

## 2018-03-31 MED ORDER — SODIUM CHLORIDE 0.9 % IV SOLN
1.0000 g | INTRAVENOUS | Status: DC
  Administered 2018-04-01 – 2018-04-03 (×3): 1 g via INTRAVENOUS
  Filled 2018-03-31: qty 1
  Filled 2018-03-31: qty 10
  Filled 2018-03-31 (×2): qty 1

## 2018-03-31 MED ORDER — IPRATROPIUM-ALBUTEROL 0.5-2.5 (3) MG/3ML IN SOLN
3.0000 mL | Freq: Once | RESPIRATORY_TRACT | Status: AC
  Administered 2018-03-31: 3 mL via RESPIRATORY_TRACT
  Filled 2018-03-31: qty 3

## 2018-03-31 MED ORDER — SODIUM CHLORIDE 0.9% FLUSH
3.0000 mL | INTRAVENOUS | Status: DC | PRN
  Administered 2018-04-01: 3 mL via INTRAVENOUS
  Filled 2018-03-31: qty 3

## 2018-03-31 MED ORDER — ONDANSETRON HCL 4 MG/2ML IJ SOLN: 4.0000 mg | Freq: Four times a day (QID) | INTRAMUSCULAR | Status: DC | PRN

## 2018-03-31 MED ORDER — ACETAMINOPHEN 650 MG RE SUPP: 650.0000 mg | Freq: Four times a day (QID) | RECTAL | Status: DC | PRN

## 2018-03-31 MED ORDER — ONDANSETRON HCL 4 MG/2ML IJ SOLN
4.0000 mg | Freq: Once | INTRAMUSCULAR | Status: AC
  Administered 2018-03-31: 4 mg via INTRAVENOUS
  Filled 2018-03-31: qty 2

## 2018-03-31 MED ORDER — BUDESONIDE 0.25 MG/2ML IN SUSP
0.2500 mg | Freq: Two times a day (BID) | RESPIRATORY_TRACT | Status: DC
  Administered 2018-04-01 – 2018-04-04 (×7): 0.25 mg via RESPIRATORY_TRACT
  Filled 2018-03-31 (×7): qty 2

## 2018-03-31 MED ORDER — SODIUM CHLORIDE 0.9% FLUSH
3.0000 mL | Freq: Two times a day (BID) | INTRAVENOUS | Status: DC
  Administered 2018-04-01 – 2018-04-04 (×7): 3 mL via INTRAVENOUS

## 2018-03-31 MED ORDER — ONDANSETRON HCL 4 MG PO TABS: 4.0000 mg | ORAL_TABLET | Freq: Four times a day (QID) | ORAL | Status: DC | PRN

## 2018-03-31 MED ORDER — MAGNESIUM OXIDE 400 (241.3 MG) MG PO TABS
400.0000 mg | ORAL_TABLET | Freq: Every day | ORAL | Status: DC
  Administered 2018-04-01 – 2018-04-04 (×4): 400 mg via ORAL
  Filled 2018-03-31 (×4): qty 1

## 2018-03-31 MED ORDER — OXYCODONE HCL 5 MG PO TABS: 5.0000 mg | ORAL_TABLET | Freq: Four times a day (QID) | ORAL | Status: DC | PRN

## 2018-03-31 MED ORDER — ACYCLOVIR 800 MG PO TABS
400.0000 mg | ORAL_TABLET | Freq: Every day | ORAL | Status: DC
  Administered 2018-04-01 – 2018-04-04 (×4): 400 mg via ORAL
  Filled 2018-03-31 (×4): qty 1

## 2018-03-31 MED ORDER — INSULIN ASPART 100 UNIT/ML ~~LOC~~ SOLN
0.0000 [IU] | Freq: Three times a day (TID) | SUBCUTANEOUS | Status: DC
  Administered 2018-04-01 (×2): 4 [IU] via SUBCUTANEOUS
  Administered 2018-04-01: 7 [IU] via SUBCUTANEOUS
  Administered 2018-04-02: 4 [IU] via SUBCUTANEOUS
  Administered 2018-04-02: 7 [IU] via SUBCUTANEOUS
  Administered 2018-04-03 – 2018-04-04 (×4): 4 [IU] via SUBCUTANEOUS
  Administered 2018-04-04: 11 [IU] via SUBCUTANEOUS

## 2018-03-31 MED ORDER — AMIODARONE HCL IN DEXTROSE 360-4.14 MG/200ML-% IV SOLN: 30.0000 mg/h | INTRAVENOUS | Status: DC

## 2018-03-31 NOTE — ED Notes (Signed)
Continuous nebulizer treatment in progress.

## 2018-03-31 NOTE — ED Notes (Signed)
ED Provider at bedside. Pt with much improvement after nebulizer treatment. Pt respirations even and unlabored. Speech clear.

## 2018-03-31 NOTE — ED Notes (Signed)
Pt medicated for episode of wretching.

## 2018-03-31 NOTE — H&P (Addendum)
History and Physical    [name] HFW:[id number] DOB: Apr 24, 1945 DOA: 03/31/2018  PCP: Iona Beard, MD  Patient coming from: Home  I have personally briefly reviewed patient's old medical records in [employer]  Chief Complaint: Shortness of breath  HPI: [name] is a 73 y.o. female with medical history significant of multiple myeloma status post recent stem cell transplant at [facility], hypertension, hyperlipidemia, diabetes, paroxysmal atrial fib, nonischemic cardiomyopathy, presents to the hospital with just over 1 week of cough and shortness of breath.  She reports that her upper symptoms has progressively gotten worse.  Cough has been productive of whitish, thick sputum.  She reports having a fever 1 time, but this is not been persistent.  Over the past few days she is noted worsening wheezing.  She has had more dyspnea on exertion.  She denies any chest pain.  No vomiting or diarrhea.  No sick contacts.  ED Course: She is noted to be hypoxic on room air.  Blood pressure and heart rate are noted to be stable.  Chest x-ray indicates possible developing pneumonia.  She was started on intravenous antibiotics, given nebulizer treatments.  She still remains short of breath.  She is been referred for admission.  Review of Systems: As per HPI otherwise 10 point review of systems negative.    Past Medical History:  Diagnosis Date  . Anemia    takes Ferrous Fumarate 2 times week  . Arthritis    ankles  . Blood transfusion    hx of-22yr ago  . Cardiomyopathy secondary   . Cataract    bilateral and immature  . CHF (congestive heart failure) ([place]    takes Lasix prn, Echo 2015 with EF 40%  . Chronic back pain   . Diabetes mellitus    takes Glipizide and Metformin daily  . Dysrhythmia    pt takes Digoxin and Carvedilol daily  . GERD (gastroesophageal reflux disease)    takes Protonix seldom  . Gout    hx of x 1 time in Aug 2012  . H/O hiatal hernia   .  Hemorrhoid   . Hypertension    takes Diovan and Spirololactone daily  . Left leg pain   . Multiple myeloma (HCC)    [place] . Overweight(278.02)   . Peripheral neuropathy   . Pneumonia    hx of;many yrs ago  . Sleep apnea    doesn't use CPAP  . Systolic heart failure    Chronic  . Thyroid nodule    right    Past Surgical History:  Procedure Laterality Date  . ABDOMINAL HYSTERECTOMY  1991  . APPENDECTOMY  1991  . CARDIAC DEFIBRILLATOR PLACEMENT  >356yrago  . Cardiac defirillator removed     removed <89m91monthemoved d/t shocking pt 15times;pierced sac of heart 1000cc ;drained  . CHOLECYSTECTOMY OPEN  1992  . COLONOSCOPY    . COLONOSCOPY N/A 02/08/2014   slf: 1. NO SOURCE FOR ANEMIA IDENTIFIED. 2. SINGLE polyp removed 3. Moderate divertculosis in the sigmoid colon and descencing colon. 4. Moderate sized internal hemorrhoids.   . dMarland Kitchenfibrillator removed    . ESOPHAGOGASTRODUODENOSCOPY N/A 02/08/2014   SLF: No source for anemia identified 2. Black foreign body in cardia 3. Mild erosive gastriitis (inflammaion0 was found in the gastric antrum; multiple biopsies were performed.   . leg lipoma    . 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 ovarian cyst  removed  1991  . stomach stapling surgery  80's  . THYROID LOBECTOMY  07/27/2011   Procedure: THYROID LOBECTOMY;  Surgeon: Gayland Curry, MD,FACS;  Location: [place];  Service: General;  Laterality: Right;  . TUBAL LIGATION  1971  . tummy tuck  1991    Social History:  reports that she has never smoked. She has never used smokeless tobacco. She reports current alcohol use. She reports that she does not use drugs.  Allergies  Allergen Reactions  . Doxycycline Rash    Family History  Problem Relation Age of Onset  . Kidney failure Mother   . Heart failure Other   . Anesthesia problems Neg Hx   . Hypotension Neg Hx   . Malignant hyperthermia Neg Hx   . Pseudochol deficiency Neg Hx       Prior to Admission medications   Medication Sig Start Date End Date Taking? Authorizing Provider  acyclovir (ZOVIRAX) 400 MG tablet Take 400 mg by mouth daily. 01/05/18  Yes [provider]  apixaban (ELIQUIS) 5 MG TABS tablet Take 5 mg by mouth 2 (two) times daily.   Yes [provider]  atorvastatin (LIPITOR) 40 MG tablet Take 40 mg by mouth daily.    Yes [provider]  Cholecalciferol (VITAMIN D3) 50 MCG (2000 UT) CHEW Chew by mouth.   Yes [provider]  Cinnamon 500 MG capsule Take by mouth.   Yes [provider]  Flaxseed, Linseed, (FLAX SEED OIL) 1000 MG CAPS Take by mouth.   Yes [provider]  furosemide (LASIX) 20 MG tablet TAKE 2 TABLETS (40MG) IN THE MORNING AND 1 TABLET (20MG) BY MOUTH IN THE EVENING. 03/13/18  Yes Lendon Colonel, NP  glucose blood test strip  07/02/15  Yes [provider]  HUMALOG KWIKPEN 100 UNIT/ML KiwkPen Take 20-25 Units by mouth 3 (three) times daily. As directed per sliding scale. To take 25 units upon taking Steroid regimen prior to scheduled treatments 09/17/14  Yes [provider]  KLOR-CON M20 20 MEQ tablet Take 20 mEq by mouth 2 (two) times daily. 12/12/17  Yes [provider]  levothyroxine (SYNTHROID, LEVOTHROID) 100 MCG tablet Take 100 mcg by mouth daily before breakfast.   Yes [provider]  magnesium oxide (MAG-OX) 400 MG tablet Take 1 tablet by mouth daily. 01/05/18  Yes [provider]  metoprolol tartrate (LOPRESSOR) 25 MG tablet Take 25 mg by mouth 2 (two) times daily.  12/06/17  Yes [provider]  Multiple Vitamin (MULTIVITAMIN) capsule Take by mouth.   Yes [provider]  oxyCODONE (OXY IR/ROXICODONE) 5 MG immediate release tablet Take by mouth. 12/06/17  Yes [provider]  pediatric multivitamin-iron (POLY-VI-SOL WITH IRON) 15 MG chewable tablet Chew by mouth.   Yes [provider]   pomalidomide (POMALYST) 1 MG capsule Take 1 capsule by mouth daily. Take one capsule by mouth daily for 21 days then off for 7 03/27/18  Yes [provider]  tetrahydrozoline (EYE DROPS) 0.05 % ophthalmic solution Place 1 drop into both eyes daily as needed. For dry eye relief   Yes [provider]  BD PEN NEEDLE MICRO U/F 32G X 6 MM MISC INJECT INSULIN BEFORE EACH MEAL AND AT BEDTIME. 12/06/17   [provider]  nystatin (MYCOSTATIN) 100000 UNIT/ML suspension TAKE 5 ML 4 TIMES A DAY X14 DAY 12/19/17   [provider]  ondansetron (ZOFRAN) 8 MG tablet TAKE 1 TABLET BY MOUTH ONCE AS  NEEDED FOR NAUSEA\ 12/06/17   [provider]  prochlorperazine (COMPAZINE) 10 MG tablet Take 10 mg by mouth every 6 (six) hours as needed for nausea or vomiting.  05/25/16   [provider]    Physical Exam: Vitals:   03/31/18 1517 03/31/18 1551 03/31/18 1609 03/31/18 1630  BP:  120/67  (!) 113/93  Pulse:  78  93  Resp:  (!) 24  (!) 25  Temp: 98 F (36.7 C)     TempSrc: Oral     SpO2:  96% 95% 99%  Weight:      Height:        Constitutional: NAD, calm, has difficulty completing sentences while talking due to breathing Eyes: PERRL, lids and conjunctivae normal ENMT: Mucous membranes are moist. Posterior pharynx clear of any exudate or lesions.Normal dentition.  Neck: normal, supple, no masses, no thyromegaly Respiratory: Bilateral wheezes.  Increased respiratory effort. No accessory muscle use.  Cardiovascular: Regular rate and rhythm, no murmurs / rubs / gallops. No extremity edema. 2+ pedal pulses. No carotid bruits.  Abdomen: no tenderness, no masses palpated. No hepatosplenomegaly. Bowel sounds positive.  Musculoskeletal: no clubbing / cyanosis. No joint deformity upper and lower extremities. Good ROM, no contractures. Normal muscle tone.  Skin: no rashes, lesions, ulcers. No induration Neurologic: CN 2-12 grossly intact. Sensation intact, DTR normal.  Strength 5/5 in all 4.  Psychiatric: Normal judgment and insight. Alert and oriented x 3. Normal mood.    Labs on Admission: I have personally reviewed following labs and imaging studies  CBC: Recent Labs  Lab 03/31/18 1235  WBC 2.2*  NEUTROABS 1.1*  HGB 9.4*  HCT 32.4*  MCV 84.4  PLT 119   Basic Metabolic Panel: Recent Labs  Lab 03/31/18 1235  NA 136  K 3.5  CL 102  CO2 22  GLUCOSE 161*  BUN 18  CREATININE 1.21*  CALCIUM 8.7*   GFR: Estimated Creatinine Clearance: 59.2 mL/min (A) (by C-G formula based on SCr of 1.21 mg/dL (H)). Liver Function Tests: Recent Labs  Lab 03/31/18 1235  AST 22  ALT 30  ALKPHOS 189*  BILITOT 0.6  PROT 6.3*  ALBUMIN 2.7*   No results for input(s): LIPASE, AMYLASE in the last 168 hours. No results for input(s): AMMONIA in the last 168 hours. Coagulation Profile: No results for input(s): INR, PROTIME in the last 168 hours. Cardiac Enzymes: Recent Labs  Lab 03/31/18 1235  TROPONINI <0.03   BNP (last 3 results) No results for input(s): PROBNP in the last 8760 hours. HbA1C: No results for input(s): HGBA1C in the last 72 hours. CBG: No results for input(s): GLUCAP in the last 168 hours. Lipid Profile: No results for input(s): CHOL, HDL, LDLCALC, TRIG, CHOLHDL, LDLDIRECT in the last 72 hours. Thyroid Function Tests: No results for input(s): TSH, T4TOTAL, FREET4, T3FREE, THYROIDAB in the last 72 hours. Anemia Panel: No results for input(s): VITAMINB12, FOLATE, FERRITIN, TIBC, IRON, RETICCTPCT in the last 72 hours. Urine analysis:    Component Value Date/Time   COLORURINE YELLOW 09/17/2014 1730   APPEARANCEUR CLEAR 09/17/2014 1730   LABSPEC 1.025 09/17/2014 1730   PHURINE 5.5 09/17/2014 1730   GLUCOSEU NEGATIVE 09/17/2014 1730   HGBUR NEGATIVE 09/17/2014 1730   BILIRUBINUR NEGATIVE 09/17/2014 1730   KETONESUR NEGATIVE 09/17/2014 1730   PROTEINUR NEGATIVE 09/17/2014 1730   UROBILINOGEN 0.2 09/17/2014 1730   NITRITE  NEGATIVE 09/17/2014 1730   LEUKOCYTESUR NEGATIVE 09/17/2014 1730    Radiological Exams on Admission: Dg Chest 2 View  Result Date: 03/31/2018 CLINICAL DATA:  Cough, wheezing and shortness of breath for several days. EXAM: CHEST - 2 VIEW COMPARISON:  06/24/2016 and prior exams FINDINGS: Cardiomegaly and RIGHT IJ Port-A-Cath again noted. Equivocal LOWER lobe airspace disease identified on the LATERAL view. Mild peribronchial thickening identified bilaterally. There is no evidence of pulmonary edema, suspicious pulmonary nodule/mass, pleural effusion, or pneumothorax. No acute bony abnormalities are identified. IMPRESSION: Equivocal LOWER lobe airspace disease on the LATERAL view which may represent pneumonia. Mild chronic peribronchial thickening. Electronically Signed   By: Margarette Canada M.D.   On: 03/31/2018 13:17    EKG: Independently reviewed.  Sinus rhythm without acute changes  Assessment/Plan Active Problems:   Essential hypertension   Chronic systolic congestive heart failure (HCC)   Diabetes mellitus with complication (HCC)   HLD (hyperlipidemia)   Anemia associated with chemotherapy   Chronic kidney disease (CKD), stage III (moderate) (HCC)   IgA myeloma (HCC)   Acute respiratory failure with hypoxia (HCC)   Hypothyroidism   PAF (paroxysmal atrial fibrillation) (HCC)   Pneumonia     1. Acute respiratory failure with hypoxia.  Related to pneumonia.  We will try to wean off oxygen as tolerated. 2. Community-acquired pneumonia.  Started on intravenous antibiotics.  Continue pulmonary hygiene.  Continue bronchodilators.  Since she is significantly wheezing, will start on steroids. 3. Nonischemic cardiomyopathy.  Patient previously had low ejection fraction, on most recent echo EF was noted to be 50 to 55%.  She is followed by cardiology.  Volume status appears to be compensated.  Will continue on home dose of Lasix.  Continue beta-blockers. 4. Paroxysmal atrial fibrillation.   Currently in sinus rhythm.  Continue on beta-blockers.  Anticoagulated with apixaban. 5. Diabetes.  Start on sliding scale insulin.  Follow blood sugars. 6. Hyperlipidemia.  Continue statin. 7. Hypothyroidism.  Continue on Synthroid. 8. History of myeloma.  Recent stem cell transplant.  She is on oral therapy which is currently on hold.  Continue to follow with oncology. 9. Chronic kidney disease stage III.  Creatinine is currently at baseline.  Continue to monitor. 10. 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  Hemoglobin is near baseline.  No signs of bleeding.  Continue to monitor  DVT prophylaxis: Apixaban Code Status: Full code Family Communication: Discussed with daughter at the bedside Disposition Plan: Discharge home once improved Consults called:   Admission status: Inpatient, MedSurg  Kathie Dike MD [facility] Pager [phone]  If 7PM-7AM, please contact night-coverage [email or web address] Password [facility]  03/31/2018, 4:37 PM   Addendum:  Informed by Dr. Vanita Panda that patient is still wheezing and continues to be short of breath and she will be started on bipap. She is not in distress right now. Since she is being started on bipap, bed assignment will be changed to stepdown.   [employer]

## 2018-03-31 NOTE — ED Notes (Signed)
Audible wheezing observed with dyspnea noted. Oxygen saturation 90% on room air. Pt denies oxygen dependency. Short of breath sentences noted. Respiratory therapist to bedside for assessment and neb treatment.

## 2018-03-31 NOTE — ED Notes (Signed)
Respiratory therapy called for bipap per dr Vanita Panda.

## 2018-03-31 NOTE — ED Notes (Signed)
Pt aware of plan of care to admit. Family member to bedside. no new complaints or concerns verbalized.

## 2018-03-31 NOTE — ED Notes (Signed)
Pt tolerating bipap. Plan of care unchanged.

## 2018-03-31 NOTE — ED Notes (Signed)
Notified AC for amiodarone

## 2018-03-31 NOTE — ED Provider Notes (Addendum)
[facility] EMERGENCY DEPARTMENT Provider Note   CSN: [id number] Arrival date & time: 03/31/18  1200     History   Chief Complaint Chief Complaint  Patient presents with  . Shortness of Breath    HPI [name] is a 73 y.o. female.  HPI Patient presents with her daughter who is assists with much of the HPI. Patient has multiple medical issues including congestive heart failure, multiple myeloma, but no history of pulmonary disease. She now presents with 1 week of worsening fatigue, dyspnea. Notably, the patient recently had evaluation by our oncologist, with designation as being in remission from her multiple myeloma. Over the past week the patient has had progression of symptoms, without clear alleviating or exacerbating factors, though with noted decrease in capacity to perform ADL. No reported fever, no new pain. No change in medication, and she states that she was compliant with all meds until today, when she was too uncomfortable to take her medication.  Patient notes that she has had some weight loss, rather than weight gain, denies new extremity swelling.  She does not wear home oxygen.  Past Medical History:  Diagnosis Date  . Anemia    takes Ferrous Fumarate 2 times week  . Arthritis    ankles  . Blood transfusion    hx of-70yr ago  . Cardiomyopathy secondary   . Cataract    bilateral and immature  . CHF (congestive heart failure) ([place]    takes Lasix prn, Echo 2015 with EF 40%  . Chronic back pain   . Diabetes mellitus    takes Glipizide and Metformin daily  . Dysrhythmia    pt takes Digoxin and Carvedilol daily  . GERD (gastroesophageal reflux disease)    takes Protonix seldom  . Gout    hx of x 1 time in Aug 2012  . H/O hiatal hernia   . Hemorrhoid   . Hypertension    takes Diovan and Spirololactone daily  . Left leg pain   . Multiple myeloma (HCC)    [facility]; [facility] . Overweight(278.02)   .  Peripheral neuropathy   . Pneumonia    hx of;many yrs ago  . Sleep apnea    doesn't use CPAP  . Systolic heart failure    Chronic  . Thyroid nodule    right    Patient Active Problem List   Diagnosis Date Noted  . Community acquired pneumonia of left lower lobe of lung ([place]   . Acute bronchitis 06/19/2016  . SOB (shortness of breath) 06/19/2016  . Neutropenia ([place] 06/19/2016  . Disease of thyroid gland 07/25/2015  . Personal history of other medical treatment 07/25/2015  . 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 Anemia associated with chemotherapy 11/26/2014  . Hematoma 10/23/2014  . Hypotension 09/17/2014  . Chronic systolic congestive heart failure ([place] 09/17/2014  . AKI (acute kidney injury) ([place] 09/17/2014  . Diabetes mellitus with complication (HA[place] 047/82/9562 . HLD (hyperlipidemia) 09/17/2014  . Multiple myeloma ([place] 09/17/2014  . Chest pain 09/17/2014  . Elevated troponin   . Fall at home   . Thrombocytopenia ([place]   . Encounter for antineoplastic chemotherapy 09/04/2014  . Post menopausal syndrome 08/07/2014  . Acquired pancytopenia ([place] 07/30/2014  . Nodular adrenal cortex ([place] 06/16/2014  . Arthritis, degenerative 06/16/2014  . Diabetes mellitus ([place] 06/16/2014  . Gastro-esophageal reflux disease without esophagitis 06/16/2014  . H/O deep venous thrombosis 06/16/2014  .  H/O pneumothorax 06/16/2014  . Personal history of other diseases of the circulatory system 06/16/2014  . BP (high blood pressure) 06/16/2014  . Obstructive apnea 06/16/2014  . History of appendectomy 06/16/2014  . History of cholecystectomy 06/16/2014  . History of surgical procedure 06/16/2014  . Bariatric surgery status 06/16/2014  . H/O: hysterectomy 06/16/2014  . H/O tubal ligation 06/16/2014  . 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 Chronic congestive heart failure ([place]) 05/01/2014  . IgA myeloma ([place]) 05/01/2014  . Abnormal presence of protein in  urine 03/27/2014  . Impaired renal function 03/27/2014  . Stiffness of joint, ankle and foot 01/09/2014  . Stiffness of joint of left pelvic region and thigh 01/09/2014  . Stiffness of joint of right pelvic region and thigh 01/09/2014  . Pain in joint, pelvic region and thigh 01/09/2014  . Muscle weakness (generalized) 01/09/2014  . Pain in joint, ankle and foot 01/09/2014  . Acute on chronic combined systolic and diastolic heart failure ([place]) 10/25/2013  . Microcytic anemia 10/25/2013  . CKD (chronic kidney disease) stage 3, GFR 30-59 ml/min (HCC) 10/25/2013  . Chronic kidney disease (CKD), stage III (moderate) ([place]) 10/25/2013  . Pleuritic chest pain 10/24/2013  . S/P partial thyroidectomy -Right 07/28/2011  . History of partial thyroidectomy 07/28/2011  . GASTROPARESIS 06/02/2010  . PALPITATIONS, OCCASIONAL 04/23/2010  . DIABETES MELLITUS 09/18/2008  . Morbid obesity ([place]) 09/18/2008  . Essential hypertension 09/18/2008  . CARDIOMYOPATHY, SECONDARY 09/18/2008  . SYSTOLIC HEART FAILURE, CHRONIC 09/18/2008    Past Surgical History:  Procedure Laterality Date  . ABDOMINAL HYSTERECTOMY  1991  . APPENDECTOMY  1991  . CARDIAC DEFIBRILLATOR PLACEMENT  >19yr ago  . Cardiac defirillator removed     removed <356monthremoved d/t shocking pt 15times;pierced sac of heart 1000cc ;drained  . CHOLECYSTECTOMY OPEN  1992  . COLONOSCOPY    . COLONOSCOPY N/A 02/08/2014   slf: 1. NO SOURCE FOR ANEMIA IDENTIFIED. 2. SINGLE polyp removed 3. Moderate divertculosis in the sigmoid colon and descencing colon. 4. Moderate sized internal hemorrhoids.   . Marland Kitchenefibrillator removed    . ESOPHAGOGASTRODUODENOSCOPY N/A 02/08/2014   SLF: No source for anemia identified 2. Black foreign body in cardia 3. Mild erosive gastriitis (inflammaion0 was found in the gastric antrum; multiple biopsies were performed.   . leg lipoma    . 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 ovarian cyst removed  1991  . stomach  stapling surgery  80's  . THYROID LOBECTOMY  07/27/2011   Procedure: THYROID LOBECTOMY;  Surgeon: ErGayland CurryMD,FACS;  [place] Service: General;  Laterality: Right;  . TUBAL LIGATION  1971  . tummy tuck  1991     OB History    Gravida  2   Para  2   Term  2   Preterm      AB      Living        SAB      TAB      Ectopic      Multiple      Live Births               Home Medications    Prior to Admission medications   Medication Sig Start Date End Date Taking? Authorizing Provider  acyclovir (ZOVIRAX) 400 MG tablet Take 400 mg by mouth daily. 01/05/18   [provider]  apixaban (ELIQUIS) 5 MG TABS tablet Take 5 mg by mouth 2 (two) times daily.  [provider]  atorvastatin (LIPITOR) 40 MG tablet Take 40 mg by mouth daily.     [provider]  BD PEN NEEDLE MICRO U/F 32G X 6 MM MISC INJECT INSULIN BEFORE EACH MEAL AND AT BEDTIME. 12/06/17   [provider]  Cholecalciferol (VITAMIN D3) 50 MCG (2000 UT) CHEW Chew by mouth.    [provider]  Cinnamon 500 MG capsule Take by mouth.    [provider]  famotidine (PEPCID) 20 MG tablet Take by mouth. 12/06/17   [provider]  ferrous sulfate 325 (65 FE) MG tablet Take by mouth.    [provider]  Flaxseed, Linseed, (FLAX SEED OIL) 1000 MG CAPS Take by mouth.    [provider]  furosemide (LASIX) 20 MG tablet TAKE 2 TABLETS ('40MG'$ ) IN THE MORNING AND 1 TABLET ('20MG'$ ) BY MOUTH IN THE EVENING. 03/13/18   Lendon Colonel, NP  glucose blood test strip  07/02/15   [provider]  HUMALOG KWIKPEN 100 UNIT/ML KiwkPen Take 20-25 Units by mouth 3 (three) times daily. As directed per sliding scale. To take 25 units upon taking Steroid regimen prior to scheduled treatments 09/17/14   [provider]  KLOR-CON M20 20 MEQ tablet Take 20 mEq by mouth 2 (two) times daily. 12/12/17   [provider]  levothyroxine  (SYNTHROID, LEVOTHROID) 100 MCG tablet Take 100 mcg by mouth daily before breakfast.    [provider]  magnesium oxide (MAG-OX) 400 MG tablet Take 1 tablet by mouth daily. 01/05/18   [provider]  metoprolol tartrate (LOPRESSOR) 25 MG tablet Take 25 mg by mouth 2 (two) times daily.  12/06/17   [provider]  Multiple Vitamin (MULTIVITAMIN) capsule Take by mouth.    [provider]  nystatin (MYCOSTATIN) 100000 UNIT/ML suspension TAKE 5 ML 4 TIMES A DAY X14 DAY 12/19/17   [provider]  ondansetron (ZOFRAN) 8 MG tablet TAKE 1 TABLET BY MOUTH ONCE AS NEEDED FOR NAUSEA\ 12/06/17   [provider]  oxyCODONE (OXY IR/ROXICODONE) 5 MG immediate release tablet Take by mouth. 12/06/17   [provider]  pediatric multivitamin-iron (POLY-VI-SOL WITH IRON) 15 MG chewable tablet Chew by mouth.    [provider]  prochlorperazine (COMPAZINE) 10 MG tablet Take 10 mg by mouth every 6 (six) hours as needed for nausea or vomiting.  05/25/16   [provider]  simethicone (MYLICON) 80 MG chewable tablet Chew by mouth. 12/06/17   [provider]  spironolactone (ALDACTONE) 25 MG tablet Take by mouth. 04/24/14   [provider]  tetrahydrozoline (EYE DROPS) 0.05 % ophthalmic solution Place 1 drop into both eyes daily as needed. For dry eye relief    [provider]  vitamin B-12 (CYANOCOBALAMIN) 50 MCG tablet Take by mouth.    [provider]    Family History Family History  Problem Relation Age of Onset  . Kidney failure Mother   . Heart failure Other   . Anesthesia problems Neg Hx   . Hypotension Neg Hx   . Malignant hyperthermia Neg Hx   . Pseudochol deficiency Neg Hx     Social History Social History   Tobacco Use  . Smoking status: Never Smoker  . Smokeless tobacco: Never Used  Substance Use Topics  . Alcohol use: Yes    Alcohol/week: 0.0 standard drinks    Comment: glass wine  occasionally  . Drug use: No     Allergies  Doxycycline   Review of Systems Review of Systems  Constitutional:       Per HPI, otherwise negative  HENT:       Per HPI, otherwise negative  Respiratory:       Per HPI, otherwise negative  Cardiovascular:       Per HPI, otherwise negative  Gastrointestinal: Negative for vomiting.  Endocrine:       Negative aside from HPI  Genitourinary:       Neg aside from HPI   Musculoskeletal:       Per HPI, otherwise negative  Skin: Negative.   Allergic/Immunologic: Positive for immunocompromised state.  Neurological: Positive for weakness. Negative for syncope.     Physical Exam Updated Vital Signs BP 118/76   Pulse 77   Resp 20   Ht '5\' 4"'$  (1.626 m)   Wt (!) 137.9 kg   SpO2 98%   BMI 52.18 kg/m   Physical Exam Vitals signs and nursing note reviewed.  Constitutional:      General: She is not in acute distress.    Appearance: She is well-developed.     Comments: Uncomfortable appearing obese elderly female awake and alert, with audible wheezing  HENT:     Head: Normocephalic and atraumatic.  Eyes:     Conjunctiva/sclera: Conjunctivae normal.  Cardiovascular:     Rate and Rhythm: Regular rhythm. Tachycardia present.  Pulmonary:     Effort: Tachypnea and respiratory distress present.     Breath sounds: Decreased breath sounds and wheezing present.  Chest:     Comments: Chemotherapy port unremarkable Abdominal:     General: There is no distension.  Skin:    General: Skin is warm and dry.  Neurological:     Mental Status: She is alert and oriented to person, place, and time.     Cranial Nerves: No cranial nerve deficit.      ED Treatments / Results  Labs (all labs ordered are listed, but only abnormal results are displayed) Labs Reviewed  COMPREHENSIVE METABOLIC PANEL - Abnormal; Notable for the following components:      Result Value   Glucose, Bld 161 (*)    Creatinine, Ser 1.21 (*)    Calcium 8.7 (*)     Total Protein 6.3 (*)    Albumin 2.7 (*)    Alkaline Phosphatase 189 (*)    GFR calc non Af Amer 45 (*)    GFR calc Af Amer 52 (*)    All other components within normal limits  CBC WITH DIFFERENTIAL/PLATELET - Abnormal; Notable for the following components:   WBC 2.2 (*)    RBC 3.84 (*)    Hemoglobin 9.4 (*)    HCT 32.4 (*)    MCH 24.5 (*)    MCHC 29.0 (*)    RDW 15.9 (*)    Neutro Abs 1.1 (*)    All other components within normal limits  BRAIN NATRIURETIC PEPTIDE - Abnormal; Notable for the following components:   B Natriuretic Peptide 510.0 (*)    All other components within normal limits  TROPONIN I    EKG EKG Interpretation  Date/Time:  Friday March 31 2018 12:10:38 EST Ventricular Rate:  86 PR Interval:    QRS Duration: 95 QT Interval:  377 QTC Calculation: 451 R Axis:   -12 Text Interpretation:  Sinus rhythm Abnormal R-wave progression, late transition Inferior infarct, old Baseline wander in lead(s) I II aVR Abnormal ekg Confirmed by Carmin Muskrat [phone]) on 03/31/2018 12:20:55 PM  Radiology Dg Chest 2 View  Result Date: 03/31/2018 CLINICAL DATA:  Cough, wheezing and shortness of breath for several days. EXAM: CHEST - 2 VIEW COMPARISON:  06/24/2016 and prior exams FINDINGS: Cardiomegaly and RIGHT IJ Port-A-Cath again noted. Equivocal LOWER lobe airspace disease identified on the LATERAL view. Mild peribronchial thickening identified bilaterally. There is no evidence of pulmonary edema, suspicious pulmonary nodule/mass, pleural effusion, or pneumothorax. No acute bony abnormalities are identified. IMPRESSION: Equivocal LOWER lobe airspace disease on the LATERAL view which may represent pneumonia. Mild chronic peribronchial thickening. Electronically Signed   By: Margarette Canada M.D.   On: 03/31/2018 13:17    Procedures Procedures (including critical care time)  Medications Ordered in ED Medications  cefTRIAXone (ROCEPHIN) 1 g in sodium chloride 0.9 % 100 mL IVPB  (has no administration in time range)  azithromycin (ZITHROMAX) 500 mg in sodium chloride 0.9 % 250 mL IVPB (has no administration in time range)  ipratropium (ATROVENT) nebulizer solution 0.5 mg (0.5 mg Nebulization Given 03/31/18 1237)  ipratropium-albuterol (DUONEB) 0.5-2.5 (3) MG/3ML nebulizer solution 3 mL (3 mLs Nebulization Given 03/31/18 1356)     Initial Impression / Assessment and Plan / ED Course  I have reviewed the triage vital signs and the nursing notes.  Pertinent labs & imaging results that were available during my care of the patient were reviewed by me and considered in my medical decision making (see chart for details).     1:58 PM Patient improved substantially after initial breathing treatment.  On she continues to require oxygen, 2 L nasal cannula for appropriate saturation, but has substantial improvement otherwise. Patient now capable of speaking, without substantial difficulty. We reviewed initial findings, notable for persistent leukocytosis, though improved since most recent study, elevated BNP, and x-ray concerning for possible pneumonia. With her new oxygen requirement, consideration of pneumonia, the patient will start ceftriaxone, Cipro, require hospitalization. The patient does have elevated BNP, she notes weight loss, has no new extremity swelling, and heart failure, though a likely contributor, is not likely to be the sole etiology for her respiratory difficulty.  Patient has completed several breathing treatments, including a continuous albuterol session, has persistent wheezing. Though she has improved substantially, speaks clearly, with persistent wheezing, respiratory difficulty, patient will start BiPAP. If there is a component of heart failure contributing to her presentation, this will likely improve with this as well.  5:47 PM BiPAP started, patient in no distress, tolerating therapy  6:56 PM Patient has had a recurrence of A. fib. She confirms  that she has not taken any of her daily medication including beta-blockers. Blood pressure remains soft, with map 71. With this concern, given the patient's A. fib, though she is on Eliquis, she will require initiation of amiodarone for additional rate control.  9:10 PM Heart rate has improved substantially, now sinus, 90s, blood pressure is improved, with a map in the 80s. Patient is off BiPAP, though requiring nasal cannula for oxygen supplementation, states that she feels substantially better.   Final Clinical Impressions(s) / ED Diagnoses  Community-acquired pneumonia afib Wheezing   Carmin Muskrat, MD  CRITICAL CARE Performed by: Carmin Muskrat Total critical care time: 35 minutes Critical care time was exclusive of separately billable procedures and treating other patients. Critical care was necessary to treat or prevent imminent or life-threatening deterioration. Critical care was time spent personally by me on the following activities: development of treatment plan with patient and/or surrogate as well as nursing, discussions with consultants, evaluation of patient's response  to treatment, examination of patient, obtaining history from patient or surrogate, ordering and performing treatments and interventions, ordering and review of laboratory studies, ordering and review of radiographic studies, pulse oximetry and re-evaluation of patient's condition.  03/31/18 1400    Carmin Muskrat, MD 03/31/18 1747    Carmin Muskrat, MD 03/31/18 Sandrea Hughs    Carmin Muskrat, MD 03/31/18 2111

## 2018-03-31 NOTE — ED Notes (Signed)
Dr Vanita Panda to bedside. Current blood pressure 95/70. Pt in sustained afib rate of 132-146. Pt with hx of afib ( on eliquis ). Will continue to monitor.

## 2018-03-31 NOTE — ED Notes (Signed)
ED Provider at bedside. 

## 2018-03-31 NOTE — ED Notes (Signed)
Pending blood culture draw prior to antibiotic infusion.

## 2018-03-31 NOTE — ED Notes (Signed)
Minimal to no change in respiratory effort noted with continuous nebulizer treatment. Dr Vanita Panda informed.

## 2018-03-31 NOTE — ED Notes (Signed)
Pt with recurrence of audible wheezing. Respirations even. Duoneb pending by respiratory therapist. Will continue to monitor.

## 2018-03-31 NOTE — Progress Notes (Addendum)
Patient has sleep apnea and uses nasal CPAP. She is off BIPAP now. She had a sleep study and her optimal pressure was 11. She has been placed on nasal mask with pressure of 11, 3 liter oxygen titrate in. She still has wheezes but mostly upper airway.

## 2018-03-31 NOTE — ED Notes (Signed)
Pt Lock[place] to bedside to update pt/family on plan of care. Respiratory therapist to bedside for bibap placement.

## 2018-03-31 NOTE — ED Notes (Signed)
Pt tolerating being off bipap. Placed on [place]

## 2018-03-31 NOTE — ED Triage Notes (Signed)
Pt c/o wheezing, SOB, chills, weakness that all started last Friday but got worse over the last 3 days. Unknown fever. Pt has audible wheezing in triage.

## 2018-03-31 NOTE — Progress Notes (Signed)
Patient taken off BIPAP per MD and placed on 2.5 lpm nasal cannula. RN in room at this time.

## 2018-03-31 NOTE — ED Notes (Signed)
Called to bedside by daughter due to patient c/o feeling light headed. Pt able to respond verbally to questions asked. Skin cool and dry. Blood pressure 71/48 on rt 74/53 on left automatically and 70/60 on left manually. Dr Vanita Panda asked to come to bedside. Pt remains on continuous bipap. Maintenance iv fluids infusing. Will continue to monitor.

## 2018-03-31 NOTE — ED Notes (Signed)
Notified hospitalist regarding vital signs. See orders

## 2018-04-01 DIAGNOSIS — I5022 Chronic systolic (congestive) heart failure: Secondary | ICD-10-CM

## 2018-04-01 LAB — COMPREHENSIVE METABOLIC PANEL
ALT: 29 U/L (ref 0–44)
AST: 23 U/L (ref 15–41)
Albumin: 2.8 g/dL — ABNORMAL LOW (ref 3.5–5.0)
Alkaline Phosphatase: 179 U/L — ABNORMAL HIGH (ref 38–126)
Anion gap: 9 (ref 5–15)
BUN: 23 mg/dL (ref 8–23)
CO2: 23 mmol/L (ref 22–32)
Calcium: 8.6 mg/dL — ABNORMAL LOW (ref 8.9–10.3)
Chloride: 105 mmol/L (ref 98–111)
Creatinine, Ser: 1.4 mg/dL — ABNORMAL HIGH (ref 0.44–1.00)
GFR, EST AFRICAN AMERICAN: 44 mL/min — AB (ref >60)
GFR, EST NON AFRICAN AMERICAN: 38 mL/min — AB (ref >60)
Glucose, Bld: 180 mg/dL — ABNORMAL HIGH (ref 70–99)
Potassium: 4.7 mmol/L (ref 3.5–5.1)
Sodium: 137 mmol/L (ref 135–145)
TOTAL PROTEIN: 6.6 g/dL (ref 6.5–8.1)
Total Bilirubin: 0.4 mg/dL (ref 0.3–1.2)

## 2018-04-01 LAB — CBC
HEMATOCRIT: 32.6 % — AB (ref 36.0–46.0)
Hemoglobin: 9.3 g/dL — ABNORMAL LOW (ref 12.0–15.0)
MCH: 24.5 pg — ABNORMAL LOW (ref 26.0–34.0)
MCHC: 28.5 g/dL — ABNORMAL LOW (ref 30.0–36.0)
MCV: 86 fL (ref 80.0–100.0)
Platelets: 155 10*3/uL (ref 150–400)
RBC: 3.79 MIL/uL — ABNORMAL LOW (ref 3.87–5.11)
RDW: 16.1 % — ABNORMAL HIGH (ref 11.5–15.5)
WBC: 1.1 10*3/uL — CL (ref 4.0–10.5)
nRBC: 0 % (ref 0.0–0.2)

## 2018-04-01 LAB — GLUCOSE, CAPILLARY
Glucose-Capillary: 174 mg/dL — ABNORMAL HIGH (ref 70–99)
Glucose-Capillary: 205 mg/dL — ABNORMAL HIGH (ref 70–99)
Glucose-Capillary: 155 mg/dL — ABNORMAL HIGH (ref 70–99)
Glucose-Capillary: 149 mg/dL — ABNORMAL HIGH (ref 70–99)

## 2018-04-01 LAB — MRSA PCR SCREENING: MRSA by PCR: NEGATIVE

## 2018-04-01 MED ORDER — BISACODYL 5 MG PO TBEC
5.0000 mg | DELAYED_RELEASE_TABLET | Freq: Every day | ORAL | Status: DC | PRN
  Administered 2018-04-01 – 2018-04-02 (×2): 5 mg via ORAL
  Filled 2018-04-01 (×2): qty 1

## 2018-04-01 MED ORDER — ALPRAZOLAM 0.5 MG PO TABS
0.5000 mg | ORAL_TABLET | Freq: Once | ORAL | Status: AC
  Administered 2018-04-01: 0.5 mg via ORAL
  Filled 2018-04-01: qty 1

## 2018-04-01 MED ORDER — AMIODARONE HCL IN DEXTROSE 360-4.14 MG/200ML-% IV SOLN
INTRAVENOUS | Status: AC
  Filled 2018-04-01: qty 200

## 2018-04-01 MED ORDER — AMIODARONE HCL IN DEXTROSE 360-4.14 MG/200ML-% IV SOLN
30.0000 mg/h | INTRAVENOUS | Status: DC
  Administered 2018-04-01: 30 mg/h via INTRAVENOUS

## 2018-04-01 NOTE — Progress Notes (Signed)
Dr. Darrick Meigs notified of 22 beat run of Vtach while sleeping

## 2018-04-01 NOTE — Progress Notes (Signed)
PROGRESS NOTE  [name]  EZM:[id number]  DOB: 06/29/1945  DOA: 03/31/2018 PCP: Iona Beard, MD   Brief Admission Hx: 73 y.o. female with medical history significant of multiple myeloma status post recent stem cell transplant at [facility], hypertension, hyperlipidemia, diabetes, paroxysmal atrial fib, nonischemic cardiomyopathy, presents to the hospital with just over 1 week of cough and shortness of breath.  MDM/Assessment & Plan:   1. Acute respiratory failure with hypoxia.  Related to pneumonia.  We will try to wean off oxygen as tolerated. 2. Community-acquired pneumonia.  Started on intravenous antibiotics.  Continue pulmonary hygiene.  Continue bronchodilators.  Continue steroids. 3. Nonischemic cardiomyopathy.  Patient previously had low ejection fraction, on most recent echo EF was noted to be 50 to 55%.  She is followed by cardiology.  Volume status appears to be compensated.  Will continue on home dose of Lasix.  Continue beta-blockers. 4. Paroxysmal atrial fibrillation.  Pt is on amiodarone infusion, HR down to 40-50 range, try to d/c amiodarone infusion today, resume metoprolol, increase dose if needed.  Anticoagulated with apixaban. 5. Type 2 Diabetes mellitus with neurological complications.  Continue sliding scale insulin.  Follow blood sugars. 6. Hyperlipidemia.  Continue statin. 7. Hypothyroidism.  Continue on Synthroid. 8. History of myeloma.  Recent stem cell transplant.  She is on oral therapy which is currently on hold.  Continue to follow with oncology. 9. Chronic kidney disease stage III.  Creatinine is currently at baseline.  Continue to monitor. 10. 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  Hemoglobin is near baseline.  No signs of bleeding.  Continue to monitor  DVT prophylaxis: Apixaban Code Status: Full code Family Communication: Discussed with daughter at the bedside Disposition Plan: Discharge home once  improved Consults called:   Admission status: Inpatient  Antimicrobials:  Azithromycin 1/10 >  Ceftriaxone 1/10 >  Subjective: Pt remains with cough and shortness of breath.  No chest pain.  On CPAP.     Objective: Vitals:   04/01/18 0515 04/01/18 0530 04/01/18 0545 04/01/18 0600  BP: 114/87 120/90 132/82 133/84  Pulse: (!) 59 (!) 51 64 60  Resp: 19 15 (!) 35 (!) 27  Temp:      TempSrc:      SpO2: 97% 99% 100% 100%  Weight:      Height:        Intake/Output Summary (Last 24 hours) at 04/01/2018 0859 Last data filed at 04/01/2018 0500 Gross per 24 hour  Intake 1140.57 ml  Output -  Net 1140.57 ml   Filed Weights   03/31/18 1209 03/31/18 2256 04/01/18 0425  Weight: (!) 137.9 kg 132.5 kg 132.5 kg    REVIEW OF SYSTEMS  As per history otherwise all reviewed and reported negative  Exam:  General exam: on CPAP, awake, alert, NAD. Cooperative.   Respiratory system: shallow breaths bilateral with diffuse exp wheezing. Mild increased work of breathing. Cardiovascular system: S1 & S2 heard, irregularly irregular. mild JVD, No murmurs, gallops, clicks,  trace pedal edema. Gastrointestinal system: Abdomen is nondistended, soft and nontender. Normal bowel sounds heard. Central nervous system: Alert and oriented. No focal neurological deficits. Extremities: 2+ edema BLEs  Data Reviewed: Basic Metabolic Panel: Recent Labs  Lab 03/31/18 1235 04/01/18 0429  NA 136 137  K 3.5 4.7  CL 102 105  CO2 22 23  GLUCOSE 161* 180*  BUN 18 23  CREATININE 1.21* 1.40*  CALCIUM 8.7* 8.6*   Liver Function Tests: Recent Labs  Lab  03/31/18 1235 04/01/18 0429  AST 22 23  ALT 30 29  ALKPHOS 189* 179*  BILITOT 0.6 0.4  PROT 6.3* 6.6  ALBUMIN 2.7* 2.8*   No results for input(s): LIPASE, AMYLASE in the last 168 hours. No results for input(s): AMMONIA in the last 168 hours. CBC: Recent Labs  Lab 03/31/18 1235 04/01/18 0429  WBC 2.2* 1.1*  NEUTROABS 1.1*  --   HGB 9.4* 9.3*   HCT 32.4* 32.6*  MCV 84.4 86.0  PLT 168 155   Cardiac Enzymes: Recent Labs  Lab 03/31/18 1235  TROPONINI <0.03   CBG (last 3)  Recent Labs    03/31/18 1708 03/31/18 2344 04/01/18 0800  GLUCAP 169* 202* 174*   Recent Results (from the past 240 hour(s))  Blood culture (routine x 2)     Status: None (Preliminary result)   Collection Time: 03/31/18  3:11 PM  Result Value Ref Range Status   Specimen Description   Final    RIGHT ANTECUBITAL BOTTLES DRAWN AEROBIC AND ANAEROBIC   Special Requests Blood Culture adequate volume  Final   Culture   Final    NO GROWTH < 24 HOURS Performed at [facility], [address], [place], [place] [address]    Report Status PENDING  Incomplete  Blood culture (routine x 2)     Status: None (Preliminary result)   Collection Time: 03/31/18  3:16 PM  Result Value Ref Range Status   Specimen Description BLOOD LEFT ARM BOTTLES DRAWN AEROBIC ONLY  Final   Special Requests Blood Culture adequate volume  Final   Culture   Final    NO GROWTH < 24 HOURS Performed at [facility], [address], [place], [place] [address]    Report Status PENDING  Incomplete  MRSA PCR Screening     Status: None   Collection Time: 03/31/18 10:34 PM  Result Value Ref Range Status   MRSA by PCR NEGATIVE NEGATIVE Final    Comment:        The GeneXpert MRSA Assay (FDA approved for NASAL specimens only), is one component of a comprehensive MRSA colonization surveillance program. It is not intended to diagnose MRSA infection nor to guide or monitor treatment for MRSA infections. Performed at [facility], [address], [place], [place] [address]    Studies: Dg Chest 2 View  Result Date: 03/31/2018 CLINICAL DATA:  Cough, wheezing and shortness of breath for several days. EXAM: CHEST - 2 VIEW COMPARISON:  06/24/2016 and prior exams FINDINGS: Cardiomegaly and RIGHT IJ Port-A-Cath again noted. Equivocal LOWER lobe airspace disease identified on the LATERAL view. Mild  peribronchial thickening identified bilaterally. There is no evidence of pulmonary edema, suspicious pulmonary nodule/mass, pleural effusion, or pneumothorax. No acute bony abnormalities are identified. IMPRESSION: Equivocal LOWER lobe airspace disease on the LATERAL view which may represent pneumonia. Mild chronic peribronchial thickening. Electronically Signed   By: Margarette Canada M.D.   On: 03/31/2018 13:17   Scheduled Meds: . acyclovir  400 mg Oral Daily  . apixaban  5 mg Oral BID  . atorvastatin  40 mg Oral Daily  . budesonide (PULMICORT) nebulizer solution  0.25 mg Nebulization BID  . furosemide  20-40 mg Oral BID  . guaiFENesin  1,200 mg Oral BID  . insulin aspart  0-20 Units Subcutaneous TID WC  . insulin aspart  0-5 Units Subcutaneous QHS  . ipratropium-albuterol  3 mL Nebulization Q6H  . levothyroxine  100 mcg Oral Q0600  . magnesium oxide  400 mg  Oral Daily  . methylPREDNISolone (SOLU-MEDROL) injection  60 mg Intravenous Q12H  . metoprolol tartrate  25 mg Oral BID  . potassium chloride SA  20 mEq Oral BID  . sodium chloride flush  3 mL Intravenous Q12H   Continuous Infusions: . sodium chloride    . azithromycin    . cefTRIAXone (ROCEPHIN)  IV      Active Problems:   Essential hypertension   Chronic systolic congestive heart failure (HCC)   Diabetes mellitus with complication (HCC)   HLD (hyperlipidemia)   Anemia associated with chemotherapy   Chronic kidney disease (CKD), stage III (moderate) (HCC)   IgA myeloma (HCC)   Acute respiratory failure with hypoxia (HCC)   Hypothyroidism   PAF (paroxysmal atrial fibrillation) (HCC)   Pneumonia   Acute respiratory failure [facility])  Critical Care Time spent: 32 minutes   , MD [facility]  If 7PM-7AM, please contact night-coverage [email or web address] Password [email or web address] 04/01/2018, 8:59 AM    LOS: 1 day

## 2018-04-01 NOTE — Progress Notes (Signed)
Dr. Darrick Meigs notified of heart rate in 60s and current rhythm.  Order to discontinue amiodarone

## 2018-04-02 LAB — GLUCOSE, CAPILLARY
GLUCOSE-CAPILLARY: 177 mg/dL — AB (ref 70–99)
Glucose-Capillary: 202 mg/dL — ABNORMAL HIGH (ref 70–99)
Glucose-Capillary: 161 mg/dL — ABNORMAL HIGH (ref 70–99)
Glucose-Capillary: 162 mg/dL — ABNORMAL HIGH (ref 70–99)

## 2018-04-02 NOTE — Progress Notes (Signed)
PROGRESS NOTE  [name]  ZOX:[id number]  DOB: 1945/09/16  DOA: 03/31/2018 PCP: Iona Beard, MD   Brief Admission Hx: 73 y.o. female with medical history significant of multiple myeloma status post recent stem cell transplant at [facility], hypertension, hyperlipidemia, diabetes, paroxysmal atrial fib, nonischemic cardiomyopathy, presents to the hospital with just over 1 week of cough and shortness of breath.  MDM/Assessment & Plan:   1. Acute respiratory failure with hypoxia.  Related to pneumonia.  We will try to wean off oxygen as tolerated. 2. Community-acquired pneumonia.  Started on intravenous antibiotics.  Continue pulmonary hygiene.  Continue bronchodilators.  Continue steroids.  Up to chair.  3. Nonischemic cardiomyopathy.  Patient previously had low ejection fraction, on most recent echo EF was noted to be 50 to 55%.  She is followed by cardiology.  Volume status appears to be compensated.  Will continue on home dose of Lasix.  Continue beta-blockers as tolerated. 4. Paroxysmal atrial fibrillation.  Pt is off amiodarone infusion, HR down to 40-50 range, resumed metoprolol as tolerated.  Fully anticoagulated with apixaban. 5. Type 2 Diabetes mellitus with neurological complications.  Continue sliding scale insulin.  Follow blood sugars. 6. Hyperlipidemia.  Continue statin. 7. Hypothyroidism.  Continue on Synthroid. 8. History of myeloma.  Recent stem cell transplant.  She is on oral therapy which is currently on hold.  Continue to follow with oncology. 9. Chronic kidney disease stage III.  Creatinine is currently at baseline.  Continue to monitor. 10. 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  Hemoglobin is near baseline.  No signs of bleeding.  Continue to monitor  DVT prophylaxis: Apixaban Code Status: Full code Family Communication: Discussed with daughter at the bedside Disposition Plan: Discharge home once improved, transfer to  telemetry Consults called:   Admission status: Inpatient  Antimicrobials:  Azithromycin 1/10 >  Ceftriaxone 1/10 >  Subjective: Pt reports that she is less SOB.  She has persistent cough.  Tolerated CPAP well.      Objective: Vitals:   04/02/18 0540 04/02/18 0747 04/02/18 0914 04/02/18 1115  BP:      Pulse: (!) 54     Resp: (!) 23     Temp:  97.7 F (36.5 C)  97.8 F (36.6 C)  TempSrc:  Axillary  Axillary  SpO2: 100%  100%   Weight:      Height:        Intake/Output Summary (Last 24 hours) at 04/02/2018 1142 Last data filed at 04/02/2018 0100 Gross per 24 hour  Intake 608.16 ml  Output 1350 ml  Net -741.84 ml   Filed Weights   03/31/18 2256 04/01/18 0425 04/02/18 0500  Weight: 132.5 kg 132.5 kg (!) 137.1 kg    REVIEW OF SYSTEMS  As per history otherwise all reviewed and reported negative  Exam:  General exam: on CPAP, awake, alert, NAD. Cooperative.   Respiratory system: shallow breaths bilateral with diffuse exp wheezing. Mild increased work of breathing. Cardiovascular system: S1 & S2 heard, irregularly irregular. mild JVD, No murmurs, gallops, clicks,  trace pedal edema. Gastrointestinal system: Abdomen is nondistended, soft and nontender. Normal bowel sounds heard. Central nervous system: Alert and oriented. No focal neurological deficits. Extremities: 2+ edema BLEs  Data Reviewed: Basic Metabolic Panel: Recent Labs  Lab 03/31/18 1235 04/01/18 0429  NA 136 137  K 3.5 4.7  CL 102 105  CO2 22 23  GLUCOSE 161* 180*  BUN 18 23  CREATININE 1.21* 1.40*  CALCIUM 8.7*  8.6*   Liver Function Tests: Recent Labs  Lab 03/31/18 1235 04/01/18 0429  AST 22 23  ALT 30 29  ALKPHOS 189* 179*  BILITOT 0.6 0.4  PROT 6.3* 6.6  ALBUMIN 2.7* 2.8*   No results for input(s): LIPASE, AMYLASE in the last 168 hours. No results for input(s): AMMONIA in the last 168 hours. CBC: Recent Labs  Lab 03/31/18 1235 04/01/18 0429  WBC 2.2* 1.1*  NEUTROABS 1.1*  --     HGB 9.4* 9.3*  HCT 32.4* 32.6*  MCV 84.4 86.0  PLT 168 155   Cardiac Enzymes: Recent Labs  Lab 03/31/18 1235  TROPONINI <0.03   CBG (last 3)  Recent Labs    04/01/18 2111 04/02/18 0751 04/02/18 1117  GLUCAP 149* 202* 177*   Recent Results (from the past 240 hour(s))  Blood culture (routine x 2)     Status: None (Preliminary result)   Collection Time: 03/31/18  3:11 PM  Result Value Ref Range Status   Specimen Description   Final    RIGHT ANTECUBITAL BOTTLES DRAWN AEROBIC AND ANAEROBIC   Special Requests Blood Culture adequate volume  Final   Culture   Final    NO GROWTH 2 DAYS Performed at [facility], [address], [place], [place] [address]    Report Status PENDING  Incomplete  Blood culture (routine x 2)     Status: None (Preliminary result)   Collection Time: 03/31/18  3:16 PM  Result Value Ref Range Status   Specimen Description BLOOD LEFT ARM BOTTLES DRAWN AEROBIC ONLY  Final   Special Requests Blood Culture adequate volume  Final   Culture   Final    NO GROWTH 2 DAYS Performed at [facility], [address] [place], [place] [address]    Report Status PENDING  Incomplete  MRSA PCR Screening     Status: None   Collection Time: 03/31/18 10:34 PM  Result Value Ref Range Status   MRSA by PCR NEGATIVE NEGATIVE Final    Comment:        The GeneXpert MRSA Assay (FDA approved for NASAL specimens only), is one component of a comprehensive MRSA colonization surveillance program. It is not intended to diagnose MRSA infection nor to guide or monitor treatment for MRSA infections. Performed at [facility], [address] [place], [place] [address]    Studies: Dg Chest 2 View  Result Date: 03/31/2018 CLINICAL DATA:  Cough, wheezing and shortness of breath for several days. EXAM: CHEST - 2 VIEW COMPARISON:  06/24/2016 and prior exams FINDINGS: Cardiomegaly and RIGHT IJ Port-A-Cath again noted. Equivocal LOWER lobe airspace disease identified on the LATERAL  view. Mild peribronchial thickening identified bilaterally. There is no evidence of pulmonary edema, suspicious pulmonary nodule/mass, pleural effusion, or pneumothorax. No acute bony abnormalities are identified. IMPRESSION: Equivocal LOWER lobe airspace disease on the LATERAL view which may represent pneumonia. Mild chronic peribronchial thickening. Electronically Signed   By: Margarette Canada M.D.   On: 03/31/2018 13:17   Scheduled Meds: . acyclovir  400 mg Oral Daily  . apixaban  5 mg Oral BID  . atorvastatin  40 mg Oral Daily  . budesonide (PULMICORT) nebulizer solution  0.25 mg Nebulization BID  . furosemide  20-40 mg Oral BID  . guaiFENesin  1,200 mg Oral BID  . insulin aspart  0-20 Units Subcutaneous TID WC  . insulin aspart  0-5 Units Subcutaneous QHS  . ipratropium-albuterol  3 mL Nebulization Q6H  . levothyroxine  100 mcg  Oral Q0600  . magnesium oxide  400 mg Oral Daily  . methylPREDNISolone (SOLU-MEDROL) injection  60 mg Intravenous Q12H  . metoprolol tartrate  25 mg Oral BID  . potassium chloride SA  20 mEq Oral BID  . sodium chloride flush  3 mL Intravenous Q12H   Continuous Infusions: . sodium chloride    . azithromycin Stopped (04/01/18 1445)  . cefTRIAXone (ROCEPHIN)  IV 200 mL/hr at 04/01/18 1500    Active Problems:   Essential hypertension   Chronic systolic congestive heart failure (HCC)   Diabetes mellitus with complication (HCC)   HLD (hyperlipidemia)   Anemia associated with chemotherapy   Chronic kidney disease (CKD), stage III (moderate) (HCC)   IgA myeloma (HCC)   Acute respiratory failure with hypoxia (HCC)   Hypothyroidism   PAF (paroxysmal atrial fibrillation) (HCC)   Pneumonia   Acute respiratory failure [facility])  Critical Care Time spent: 31 minutes  Eligha Kmetz, MD [facility]  If 7PM-7AM, please contact night-coverage [email or web address] Password [email or web address] 04/02/2018, 11:42 AM    LOS: 2 days

## 2018-04-02 NOTE — Progress Notes (Signed)
Patient finally decided to go to sleep about 03:00 am. CPAP place on.

## 2018-04-03 LAB — CBC WITH DIFFERENTIAL/PLATELET
Abs Immature Granulocytes: 0.01 10*3/uL (ref 0.00–0.07)
Basophils Absolute: 0 10*3/uL (ref 0.0–0.1)
Basophils Relative: 0 %
Eosinophils Absolute: 0 10*3/uL (ref 0.0–0.5)
Eosinophils Relative: 0 %
HEMATOCRIT: 32.8 % — AB (ref 36.0–46.0)
HEMOGLOBIN: 9.3 g/dL — AB (ref 12.0–15.0)
Immature Granulocytes: 1 %
LYMPHS PCT: 22 %
Lymphs Abs: 0.4 10*3/uL — ABNORMAL LOW (ref 0.7–4.0)
MCH: 24.6 pg — ABNORMAL LOW (ref 26.0–34.0)
MCHC: 28.4 g/dL — ABNORMAL LOW (ref 30.0–36.0)
MCV: 86.8 fL (ref 80.0–100.0)
Monocytes Absolute: 0.2 10*3/uL (ref 0.1–1.0)
Monocytes Relative: 9 %
NEUTROS ABS: 1.2 10*3/uL — AB (ref 1.7–7.7)
Neutrophils Relative %: 68 %
Platelets: 188 10*3/uL (ref 150–400)
RBC: 3.78 MIL/uL — ABNORMAL LOW (ref 3.87–5.11)
RDW: 16.1 % — ABNORMAL HIGH (ref 11.5–15.5)
WBC: 1.7 10*3/uL — ABNORMAL LOW (ref 4.0–10.5)
nRBC: 0 % (ref 0.0–0.2)

## 2018-04-03 LAB — COMPREHENSIVE METABOLIC PANEL
ALK PHOS: 142 U/L — AB (ref 38–126)
ALT: 26 U/L (ref 0–44)
AST: 19 U/L (ref 15–41)
Albumin: 2.8 g/dL — ABNORMAL LOW (ref 3.5–5.0)
Anion gap: 8 (ref 5–15)
BUN: 33 mg/dL — ABNORMAL HIGH (ref 8–23)
CO2: 23 mmol/L (ref 22–32)
CREATININE: 1.3 mg/dL — AB (ref 0.44–1.00)
Calcium: 8.8 mg/dL — ABNORMAL LOW (ref 8.9–10.3)
Chloride: 105 mmol/L (ref 98–111)
GFR calc Af Amer: 48 mL/min — ABNORMAL LOW (ref >60)
GFR calc non Af Amer: 41 mL/min — ABNORMAL LOW (ref >60)
Glucose, Bld: 193 mg/dL — ABNORMAL HIGH (ref 70–99)
Potassium: 5.2 mmol/L — ABNORMAL HIGH (ref 3.5–5.1)
SODIUM: 136 mmol/L (ref 135–145)
Total Bilirubin: 0.3 mg/dL (ref 0.3–1.2)
Total Protein: 6.1 g/dL — ABNORMAL LOW (ref 6.5–8.1)

## 2018-04-03 LAB — GLUCOSE, CAPILLARY
GLUCOSE-CAPILLARY: 179 mg/dL — AB (ref 70–99)
Glucose-Capillary: 198 mg/dL — ABNORMAL HIGH (ref 70–99)
Glucose-Capillary: 177 mg/dL — ABNORMAL HIGH (ref 70–99)
Glucose-Capillary: 157 mg/dL — ABNORMAL HIGH (ref 70–99)

## 2018-04-03 LAB — MAGNESIUM: Magnesium: 2.3 mg/dL (ref 1.7–2.4)

## 2018-04-03 NOTE — Care Management Important Message (Signed)
Important Message  Patient Details  Name: [name] MRN: [id number] Date of Birth: [birth year]   Medicare Important Message Given:  Yes    Shelda Altes 04/03/2018, 2:05 PM

## 2018-04-03 NOTE — Progress Notes (Signed)
PROGRESS NOTE  [name]  TOI:[id number]  DOB: 1945-08-20  DOA: 03/31/2018 PCP: Iona Beard, MD   Brief Admission Hx: 73 y.o. female with medical history significant of multiple myeloma status post recent stem cell transplant at [facility], hypertension, hyperlipidemia, diabetes, paroxysmal atrial fib, nonischemic cardiomyopathy, presents to the hospital with just over 1 week of cough and shortness of breath.  MDM/Assessment & Plan:   1. Acute respiratory failure with hypoxia.  Related to pneumonia.  We will try to wean off oxygen as tolerated. 2. Community-acquired pneumonia.  Started on intravenous antibiotics.  Continue pulmonary hygiene.  Continue bronchodilators.  Continue steroids.  Up to chair.  Patient is clinically improving.  Hopeful to discharge soon. 3. Nonischemic cardiomyopathy.  Patient previously had low ejection fraction, on most recent echo EF was noted to be 50 to 55%.  She is followed by cardiology.  Volume status appears to be compensated.  Will continue on home dose of Lasix.  Continue beta-blockers as tolerated. 4. Hyperkalemia-DC supplemental potassium.  Recheck BMP in a.m.  Continue Lasix as ordered. 5. Paroxysmal atrial fibrillation.  Pt is off amiodarone infusion, HR down to 40-50 range, resumed metoprolol as tolerated.  Fully anticoagulated with apixaban.  She is having some bradycardia.  Continue to monitor and adjust metoprolol if needed. 6. Type 2 Diabetes mellitus with neurological complications.  Continue sliding scale insulin.  Follow blood sugars. 7. Hyperlipidemia.  Continue statin. 8. Hypothyroidism.  Continue on Synthroid. 9. History of myeloma.  Recent stem cell transplant.  She is on oral therapy which is currently on hold.  Continue to follow with oncology. 10. Chronic kidney disease stage III.  Creatinine is currently at baseline.  Continue to monitor. 11. 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  Hemoglobin is  near baseline.  No signs of bleeding.  Continue to monitor  DVT prophylaxis: Apixaban Code Status: Full code Family Communication: Discussed with daughter at the bedside Disposition Plan: Discharge home once improved, transfer to telemetry Consults called:   Admission status: Inpatient  Antimicrobials:  Azithromycin 1/10 >  Ceftriaxone 1/10 >  Subjective: Pt tolerated CPAP.   She rested well.  She is SOB with ambulation.        Objective: Vitals:   04/03/18 0845 04/03/18 0850 04/03/18 1005 04/03/18 1128  BP:      Pulse:   64   Resp:      Temp:    98.1 F (36.7 C)  TempSrc:    Oral  SpO2: 100% 100%    Weight:      Height:        Intake/Output Summary (Last 24 hours) at 04/03/2018 1435 Last data filed at 04/03/2018 1011 Gross per 24 hour  Intake 246 ml  Output 1750 ml  Net -1504 ml   Filed Weights   04/01/18 0425 04/02/18 0500 04/03/18 0500  Weight: 132.5 kg (!) 137.1 kg (!) 136.1 kg   REVIEW OF SYSTEMS  As per history otherwise all reviewed and reported negative  Exam:  General exam: on CPAP, awake, alert, NAD. Cooperative.   Respiratory system: shallow breaths bilateral with rare exp wheezing. No increased work of breathing. Cardiovascular system: S1 & S2 heard, irregularly irregular. mild JVD, No murmurs, gallops, clicks,  trace pedal edema. Gastrointestinal system: Abdomen is nondistended, soft and nontender. Normal bowel sounds heard. Central nervous system: Alert and oriented. No focal neurological deficits. Extremities: 2+ edema BLEs   Data Reviewed: Basic Metabolic Panel: Recent Labs  Lab 03/31/18  1235 04/01/18 0429 04/03/18 0757  NA 136 137 136  K 3.5 4.7 5.2*  CL 102 105 105  CO2 _0 GLUCOSE 161* 180* 193*  BUN 18 23 33*  CREATININE 1.21* 1.40* 1.30*  CALCIUM 8.7* 8.6* 8.8*  MG  --   --  2.3   Liver Function Tests: Recent Labs  Lab 03/31/18 1235 04/01/18 0429 04/03/18 0757  AST _1 ALT _2 ALKPHOS 189* 179* 142*   BILITOT 0.6 0.4 0.3  PROT 6.3* 6.6 6.1*  ALBUMIN 2.7* 2.8* 2.8*   No results for input(s): LIPASE, AMYLASE in the last 168 hours. No results for input(s): AMMONIA in the last 168 hours. CBC: Recent Labs  Lab 03/31/18 1235 04/01/18 0429 04/03/18 0757  WBC 2.2* 1.1* 1.7*  NEUTROABS 1.1*  --  1.2*  HGB 9.4* 9.3* 9.3*  HCT 32.4* 32.6* 32.8*  MCV 84.4 86.0 86.8  PLT 168 155 188   Cardiac Enzymes: Recent Labs  Lab 03/31/18 1235  TROPONINI <0.03   CBG (last 3)  Recent Labs    04/02/18 2122 04/03/18 0747 04/03/18 1126  GLUCAP 162* 179* 198*   Recent Results (from the past 240 hour(s))  Blood culture (routine x 2)     Status: None (Preliminary result)   Collection Time: 03/31/18  3:11 PM  Result Value Ref Range Status   Specimen Description   Final    RIGHT ANTECUBITAL BOTTLES DRAWN AEROBIC AND ANAEROBIC   Special Requests Blood Culture adequate volume  Final   Culture   Final    NO GROWTH 3 DAYS Performed at [facility], [address] [place], [place] [address]    Report Status PENDING  Incomplete  Blood culture (routine x 2)     Status: None (Preliminary result)   Collection Time: 03/31/18  3:16 PM  Result Value Ref Range Status   Specimen Description BLOOD LEFT ARM BOTTLES DRAWN AEROBIC ONLY  Final   Special Requests Blood Culture adequate volume  Final   Culture   Final    NO GROWTH 3 DAYS Performed at [facility], [address] [place], [place] [address]    Report Status PENDING  Incomplete  MRSA PCR Screening     Status: None   Collection Time: 03/31/18 10:34 PM  Result Value Ref Range Status   MRSA by PCR NEGATIVE NEGATIVE Final    Comment:        The GeneXpert MRSA Assay (FDA approved for NASAL specimens only), is one component of a comprehensive MRSA colonization surveillance program. It is not intended to diagnose MRSA infection nor to guide or monitor treatment for MRSA infections. Performed at [facility], [address], [place],  [place] [address]    Studies: No results found. Scheduled Meds: . acyclovir  400 mg Oral Daily  . apixaban  5 mg Oral BID  . atorvastatin  40 mg Oral Daily  . budesonide (PULMICORT) nebulizer solution  0.25 mg Nebulization BID  . furosemide  20-40 mg Oral BID  . guaiFENesin  1,200 mg Oral BID  . insulin aspart  0-20 Units Subcutaneous TID WC  . insulin aspart  0-5 Units Subcutaneous QHS  . ipratropium-albuterol  3 mL Nebulization Q6H  . levothyroxine  100 mcg Oral Q0600  . magnesium oxide  400 mg Oral Daily  . methylPREDNISolone (SOLU-MEDROL) injection  60 mg Intravenous Q12H  . metoprolol tartrate  25 mg Oral BID  . sodium chloride flush  3  mL Intravenous Q12H   Continuous Infusions: . sodium chloride    . azithromycin 500 mg (04/02/18 1433)  . cefTRIAXone (ROCEPHIN)  IV 1 g (04/02/18 1715)    Active Problems:   Essential hypertension   Chronic systolic congestive heart failure (HCC)   Diabetes mellitus with complication (HCC)   HLD (hyperlipidemia)   Anemia associated with chemotherapy   Chronic kidney disease (CKD), stage III (moderate) (HCC)   IgA myeloma (HCC)   Acute respiratory failure with hypoxia (HCC)   Hypothyroidism   PAF (paroxysmal atrial fibrillation) ([place])   Pneumonia   Acute respiratory failure (HCC)  Irwin Brakeman, MD [facility] 04/03/2018, 2:35 PM    LOS: 3 days

## 2018-04-03 NOTE — Evaluation (Signed)
Physical Therapy Evaluation Patient Details Name: [name] MRN: [id number] DOB: 08/15/1945 Today's Date: 04/03/2018   History of Present Illness  Patient is a 73 year old female admitted 03/31/2018 with diagnosis of acute respiratory failure with hypoxia and community acquired pneumonia. PMH: multiple myeloma, s/ recent stem cell transplant, HTN, HLD, DM, a fib, nonischemic cardiomyopathy, CKD, anemia.    Clinical Impression  Patient functioning near baseline but does reach for objects while ambulating without an assistive device. Patient does have access to a [place] at home. Patient is able to perform bed mobility with HOB elevated and transfers with min guard for safety. Patient evaluated by Physical Therapy with no further acute PT needs identified. All education has been completed and the patient has no further questions. See below for any follow-up Physical Therapy or equipment needs. PT is signing off. Thank you for this referral.     Follow Up Recommendations Home health PT;Supervision - Intermittent    Equipment Recommendations  None recommended by PT    Recommendations for Other Services       Precautions / Restrictions Precautions Precautions: Fall Restrictions Weight Bearing Restrictions: No      Mobility  Bed Mobility Overal bed mobility: Needs Assistance Bed Mobility: Supine to Sit     Supine to sit: Min guard;HOB elevated     General bed mobility comments: increased time  Transfers Overall transfer level: Needs assistance Equipment used: None Transfers: Sit to/from [facility] Sit to Stand: Min guard Stand pivot transfers: Min guard          Ambulation/Gait Ambulation/Gait assistance: Min guard Gait Distance (Feet): 300 Feet Assistive device: None Gait Pattern/deviations: Step-through pattern;Decreased step length - right;Decreased step length - left;Decreased stride length;Wide base of support Gait velocity: decreased    General Gait Details: slow somewhat labored cadence reaching for objects, SOB, limited by fatigue  Stairs            Wheelchair Mobility    Modified Rankin (Stroke Patients Only)       Balance Overall balance assessment: Mild deficits observed, not formally tested                                           Pertinent Vitals/Pain Pain Assessment: No/denies pain    Home Living Family/patient expects to be discharged to:: Private residence Living Arrangements: Alone Available Help at Discharge: Available PRN/intermittently(dts available by phone) Type of Home: Apartment Home Access: Level entry     Home Layout: One level Home Equipment: Cane - single point;Cane - quad;Bedside commode;Grab bars - tub/shower;Grab bars - toilet;Hand held shower head      Prior Function Level of Independence: Independent               Hand Dominance   Dominant Hand: Left    Extremity/Trunk Assessment   Upper Extremity Assessment Upper Extremity Assessment: Overall WFL for tasks assessed    Lower Extremity Assessment Lower Extremity Assessment: Overall WFL for tasks assessed    Cervical / Trunk Assessment Cervical / Trunk Assessment: Normal  Communication   Communication: No difficulties  Cognition Arousal/Alertness: Awake/alert Behavior During Therapy: WFL for tasks assessed/performed Overall Cognitive Status: Within Functional Limits for tasks assessed  General Comments      Exercises     Assessment/Plan    PT Assessment All further PT needs can be met in the next venue of care  PT Problem List Decreased strength;Decreased mobility;Decreased activity tolerance;Decreased balance;Obesity       PT Treatment Interventions      PT Goals (Current goals can be found in the Care Plan section)  Acute Rehab PT Goals Patient Stated Goal: get better and go home PT Goal Formulation: With  patient Time For Goal Achievement: 04/10/18 Potential to Achieve Goals: Good    Frequency     Barriers to discharge        Co-evaluation               AM-PAC PT "6 Clicks" Mobility  Outcome Measure Help needed turning from your back to your side while in a flat bed without using bedrails?: A Little Help needed moving from lying on your back to sitting on the side of a flat bed without using bedrails?: A Little Help needed moving to and from a bed to a chair (including a wheelchair)?: A Little Help needed standing up from a chair using your arms (e.g., wheelchair or bedside chair)?: None Help needed to walk in hospital room?: A Little Help needed climbing 3-5 steps with a railing? : A Little 6 Click Score: 19    End of Session   Activity Tolerance: Patient tolerated treatment well Patient left: in chair;with call bell/phone within reach Nurse Communication: Mobility status PT Visit Diagnosis: Unsteadiness on feet (R26.81);Difficulty in walking, not elsewhere classified (R26.2)    Time: 1110-1140 PT Time Calculation (min) (ACUTE ONLY): 30 min   Charges:   PT Evaluation $PT Eval Low Complexity: 1 Low PT Treatments $Gait Training: 8-22 mins        Floria Raveling. Hartnett-Rands, MS, PT Per [place] [phone] 04/03/2018, 11:50 AM

## 2018-04-03 NOTE — Progress Notes (Signed)
Patient given chin strap from sleep lab as she complains she cannot sleep with out nasal mask and chin strap.

## 2018-04-04 LAB — RENAL FUNCTION PANEL
Albumin: 2.9 g/dL — ABNORMAL LOW (ref 3.5–5.0)
Anion gap: 7 (ref 5–15)
BUN: 38 mg/dL — ABNORMAL HIGH (ref 8–23)
CO2: 26 mmol/L (ref 22–32)
Calcium: 9.1 mg/dL (ref 8.9–10.3)
Chloride: 104 mmol/L (ref 98–111)
Creatinine, Ser: 1.22 mg/dL — ABNORMAL HIGH (ref 0.44–1.00)
GFR calc Af Amer: 52 mL/min — ABNORMAL LOW (ref >60)
GFR calc non Af Amer: 45 mL/min — ABNORMAL LOW (ref >60)
Glucose, Bld: 231 mg/dL — ABNORMAL HIGH (ref 70–99)
POTASSIUM: 4.9 mmol/L (ref 3.5–5.1)
Phosphorus: 3.7 mg/dL (ref 2.5–4.6)
Sodium: 137 mmol/L (ref 135–145)

## 2018-04-04 LAB — GLUCOSE, CAPILLARY
Glucose-Capillary: 176 mg/dL — ABNORMAL HIGH (ref 70–99)
Glucose-Capillary: 255 mg/dL — ABNORMAL HIGH (ref 70–99)

## 2018-04-04 MED ORDER — HEPARIN SOD (PORK) LOCK FLUSH 100 UNIT/ML IV SOLN
500.0000 [IU] | INTRAVENOUS | Status: DC | PRN
  Filled 2018-04-04: qty 5

## 2018-04-04 MED ORDER — METOPROLOL TARTRATE 25 MG PO TABS: 12.5000 mg | ORAL_TABLET | Freq: Two times a day (BID) | ORAL | 0 refills | Status: DC

## 2018-04-04 MED ORDER — AMOXICILLIN-POT CLAVULANATE 875-125 MG PO TABS: 1.0000 | ORAL_TABLET | Freq: Two times a day (BID) | ORAL | 0 refills | Status: AC

## 2018-04-04 MED ORDER — GUAIFENESIN ER 600 MG PO TB12: 1200.0000 mg | ORAL_TABLET | Freq: Two times a day (BID) | ORAL | 0 refills | Status: AC

## 2018-04-04 MED ORDER — PREDNISONE 20 MG PO TABS: 40.0000 mg | ORAL_TABLET | Freq: Every day | ORAL | 0 refills | Status: AC

## 2018-04-04 MED ORDER — ALBUTEROL SULFATE HFA 108 (90 BASE) MCG/ACT IN AERS: 2.0000 | INHALATION_SPRAY | RESPIRATORY_TRACT | 1 refills | Status: AC | PRN

## 2018-04-04 NOTE — Care Management Note (Addendum)
Case Management Note  Patient Details  Name: [name] MRN: [id number] Date of Birth: 04-Sep-1945  Subjective/Objective: admitted with pnuemonia with recent history of stem cell transplant for multiple myeloma. Pt from home, lives alone, has family for support persons. Pt ind with ADL's. Has insurance and PCP to follow up with. Recommended for [facility] and pt agreeable. Has chosen [facility] from [employer] of providers. Aware HH has 48 hrs to make first visit. Pt on [facility] registry but not active. Pt high readmission risk score d/t # of ordered Rx. Pt has only had 1 visit in 6 months. No [facility] referral made at this time.                    Action/Plan: DC home today with HH. Referral given to [facility], [facility] rep.   Expected Discharge Date:  04/04/18               Expected Discharge Plan:  [place]  In-House Referral:  NA  Discharge planning Services  CM Consult  Post Acute Care Choice:  Home Health Choice offered to:  Patient  HH Arranged:  PT [place]:  [place]  Status of Service:  Completed, signed off  Sherald Barge, RN 04/04/2018, 10:42 AM

## 2018-04-04 NOTE — Discharge Instructions (Signed)
Seek medical care or return to ER if symptoms come back, worsen or new problems develop. Please make an appointment to follow-up with your oncologist as soon as possible for recheck. Follow-up with your primary care provider in the next week for recheck. Please make an appointment to follow-up with your cardiologist in the next 1 to 2 weeks for recheck.   Follow with Primary MD  Iona Beard, MD  and other consultant's as instructed your Hospitalist MD  Please get a complete blood count and chemistry panel checked by your Primary MD at your next visit, and again as instructed by your Primary MD.  Get Medicines reviewed and adjusted: Please take all your medications with you for your next visit with your Primary MD  Laboratory/radiological data: Please request your Primary MD to go over all hospital tests and procedure/radiological results at the follow up, please ask your Primary MD to get all Hospital records sent to his/her office.  In some cases, they will be blood work, cultures and biopsy results pending at the time of your discharge. Please request that your primary care M.D. follows up on these results.  Also Note the following: If you experience worsening of your admission symptoms, develop shortness of breath, life threatening emergency, suicidal or homicidal thoughts you must seek medical attention immediately by calling 911 or calling your MD immediately  if symptoms less severe.  You must read complete instructions/literature along with all the possible adverse reactions/side effects for all the Medicines you take and that have been prescribed to you. Take any new Medicines after you have completely understood and accpet all the possible adverse reactions/side effects.   Do not drive when taking Pain medications or sleeping medications (Benzodaizepines)  Do not take more than prescribed Pain, Sleep and Anxiety Medications. It is not advisable to combine anxiety,sleep and pain  medications without talking with your primary care practitioner  Special Instructions: If you have smoked or chewed Tobacco  in the last 2 yrs please stop smoking, stop any regular Alcohol  and or any Recreational drug use.  Wear Seat belts while driving.  Please note: You were cared for by a hospitalist during your hospital stay. Once you are discharged, your primary care physician will handle any further medical issues. Please note that NO REFILLS for any discharge medications will be authorized once you are discharged, as it is imperative that you return to your primary care physician (or establish a relationship with a primary care physician if you do not have one) for your post hospital discharge needs so that they can reassess your need for medications and monitor your lab values.      Community-Acquired Pneumonia, Adult Pneumonia is an infection of the lungs. It causes swelling in the airways of the lungs. Mucus and fluid may also build up inside the airways. One type of pneumonia can happen while a person is in a hospital. A different type can happen when a person is not in a hospital (community-acquired pneumonia).  What are the causes?  This condition is caused by germs (viruses, bacteria, or fungi). Some types of germs can be passed from one person to another. This can happen when you breathe in droplets from the cough or sneeze of an infected person. What increases the risk? You are more likely to develop this condition if you:  Have a long-term (chronic) disease, such as: ? Chronic obstructive pulmonary disease (COPD). ? Asthma. ? Cystic fibrosis. ? Congestive heart failure. ? Diabetes. ? Kidney  disease.  Have HIV.  Have sickle cell disease.  Have had your spleen removed.  Do not take good care of your teeth and mouth (poor dental hygiene).  Have a medical condition that increases the risk of breathing in droplets from your own mouth and nose.  Have a weakened  body defense system (immune system).  Are a smoker.  Travel to areas where the germs that cause this illness are common.  Are around certain animals or the places they live. What are the signs or symptoms?  A dry cough.  A wet (productive) cough.  Fever.  Sweating.  Chest pain. This often happens when breathing deeply or coughing.  Fast breathing or trouble breathing.  Shortness of breath.  Shaking chills.  Feeling tired (fatigue).  Muscle aches. How is this treated? Treatment for this condition depends on many things. Most adults can be treated at home. In some cases, treatment must happen in a hospital. Treatment may include:  Medicines given by mouth or through an IV tube.  Being given extra oxygen.  Respiratory therapy. In rare cases, treatment for very bad pneumonia may include:  Using a machine to help you breathe.  Having a procedure to remove fluid from around your lungs. Follow these instructions at home: Medicines  Take over-the-counter and prescription medicines only as told by your doctor. ? Only take cough medicine if you are losing sleep.  If you were prescribed an antibiotic medicine, take it as told by your doctor. Do not stop taking the antibiotic even if you start to feel better. General instructions   Sleep with your head and neck raised (elevated). You can do this by sleeping in a recliner or by putting a few pillows under your head.  Rest as needed. Get at least 8 hours of sleep each night.  Drink enough water to keep your pee (urine) pale yellow.  Eat a healthy diet that includes plenty of vegetables, fruits, whole grains, low-fat dairy products, and lean protein.  Do not use any products that contain nicotine or tobacco. These include cigarettes, e-cigarettes, and chewing tobacco. If you need help quitting, ask your doctor.  Keep all follow-up visits as told by your doctor. This is important. How is this prevented? A shot (vaccine)  can help prevent pneumonia. Shots are often suggested for:  People older than 71 years of age.  People older than 72 years of age who: ? Are having cancer treatment. ? Have long-term (chronic) lung disease. ? Have problems with their body's defense system. You may also prevent pneumonia if you take these actions:  Get the flu (influenza) shot every year.  Go to the dentist as often as told.  Wash your hands often. If you cannot use soap and water, use hand sanitizer. Contact a doctor if:  You have a fever.  You lose sleep because your cough medicine does not help. Get help right away if:  You are short of breath and it gets worse.  You have more chest pain.  Your sickness gets worse. This is very serious if: ? You are an older adult. ? Your body's defense system is weak.  You cough up blood. Summary  Pneumonia is an infection of the lungs.  Most adults can be treated at home. Some will need treatment in a hospital.  Drink enough water to keep your pee pale yellow.  Get at least 8 hours of sleep each night. This information is not intended to replace advice given to you  by your health care provider. Make sure you discuss any questions you have with your health care provider. Document Released: 08/25/2007 Document Revised: 11/03/2017 Document Reviewed: 11/03/2017 Elsevier Interactive Patient Education  2019 [employer].

## 2018-04-04 NOTE — Discharge Summary (Addendum)
Physician Discharge Summary  [name] ZSM:[id number] DOB: 1946/01/09 DOA: 03/31/2018  PCP: Iona Beard, MD Oncologist: Everardo All MD   Admit date: 03/31/2018 Discharge date: 04/04/2018  Admitted From: Home Disposition: Home Recommendations for Outpatient Follow-up:  1. Follow up with PCP in 1 weeks 2. Follow-up with cardiology in 1 to 2 weeks 3. Follow-up with cancer care team in 1 week.  Home Health: PT   Discharge Condition: Stable CODE STATUS: Full  Brief Hospitalization Summary: Please see all hospital notes, images, labs for full details of the hospitalization. Dr. Blythe Stanford HPI: [name] is a 73 y.o. female with medical history significant of IgA multiple myeloma status post recent stem cell transplant at [facility], hypertension, hyperlipidemia, diabetes, paroxysmal atrial fib, nonischemic cardiomyopathy, presents to the hospital with just over 1 week of cough and shortness of breath.  She reports that her upper symptoms has progressively gotten worse.  Cough has been productive of whitish, thick sputum.  She reports having a fever 1 time, but this is not been persistent.  Over the past few days she is noted worsening wheezing.  She has had more dyspnea on exertion.  She denies any chest pain.  No vomiting or diarrhea.  No sick contacts.  ED Course: She is noted to be hypoxic on room air.  Blood pressure and heart rate are noted to be stable.  Chest x-ray indicates possible developing pneumonia.  She was started on intravenous antibiotics, given nebulizer treatments.  She still remains short of breath.  She is been referred for admission.  Brief Admission Hx: 73 y.o.femalewith medical history significant ofmultiple myeloma status post recent stem cell transplant at [facility], hypertension, hyperlipidemia, diabetes, paroxysmal atrial fib, nonischemic cardiomyopathy, presents to the hospital with just over 1 week of cough and shortness of  breath.  MDM/Assessment & Plan:   1. Acute respiratory failure with hypoxia. Related to pneumonia. She has been weaned off oxygen and she is feeling a lot better.  He has been ambulating.  She was recommended for home health PT.. 2. Community-acquired pneumonia. Treated with intravenous antibiotics, pulmonary hygiene andbronchodilators. Taper down steroids.  Up to chair.  Patient is clinically improving.  Complete 3 more days of oral Augmentin. 3. Nonischemic cardiomyopathy. Patient previously had low ejection fraction, on most recent echo EF was noted to be 50 to 55%. She is followed by cardiology. Volume status appears to be compensated. Will continue on home dose of Lasix. Continue beta-blockers as tolerated. 4. Bradycardia-patient has had some bradycardia and we have reduced her metoprolol by 50% to 12.5 mg twice daily.  I have asked for her to follow-up with her cardiology team in the next 1 to 2 weeks for recheck.  I asked patient to monitor her pulse rate at home and call her cardiologist if she is having any difficulty problems.  The patient verbalized understanding. 5. Hyperkalemia- resolved. 6. Paroxysmal atrial fibrillation. Pt is off amiodarone infusion, HR down to 40-50 range, resumed metoprolol as tolerated.  Fully anticoagulated with apixaban.  7. Type 2 Diabetes mellitus with neurological complications. Continue sliding scale insulin. Follow blood sugars. 8. Hyperlipidemia. Continue statin. 9. Hypothyroidism. Continue on Synthroid. 10. History of myeloma. Recent stem cell transplant. She is on oral therapy which is currently on hold. Continue to follow with oncology. 11. Chronic kidney disease stage III. Creatinine is currently at baseline. Continue to monitor. 12. 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 Hemoglobin is near baseline. No signs of bleeding. Continue  to monitor 13. IgA Myeloma -I have advised the patient to follow-up  with her primary oncologist as soon as possible for recheck.  She verbalized understanding.  DVT prophylaxis:Apixaban Code Status:Full code Family Communication:Discussed with daughter at the bedside Disposition Plan:Discharge home Consults called: Admission status:Inpatient  Antimicrobials:  Azithromycin 1/10 >  Ceftriaxone 1/10 >  Discharge Diagnoses:  Active Problems:   Essential hypertension   Chronic systolic congestive heart failure (HCC)   Diabetes mellitus with complication (HCC)   HLD (hyperlipidemia)   Anemia associated with chemotherapy   Chronic kidney disease (CKD), stage III (moderate) (HCC)   IgA myeloma (HCC)   Acute respiratory failure with hypoxia (HCC)   Hypothyroidism   PAF (paroxysmal atrial fibrillation) (HCC)   Pneumonia   Acute respiratory failure [facility])  Discharge Instructions: Discharge Instructions    Call MD for:  difficulty breathing, headache or visual disturbances   Complete by:  As directed    Call MD for:  extreme fatigue   Complete by:  As directed    Call MD for:  persistant dizziness or light-headedness   Complete by:  As directed    Diet - low sodium heart healthy   Complete by:  As directed    Increase activity slowly   Complete by:  As directed      Allergies as of 04/04/2018      Reactions   Doxycycline Rash      Medication List    TAKE these medications   acyclovir 400 MG tablet Commonly known as:  ZOVIRAX Take 400 mg by mouth daily.   albuterol 108 (90 Base) MCG/ACT inhaler Commonly known as:  PROVENTIL HFA;VENTOLIN HFA Inhale 2 puffs into the lungs every 4 (four) hours as needed for wheezing or shortness of breath (cough, shortness of breath or wheezing.).   amoxicillin-clavulanate 875-125 MG tablet Commonly known as:  AUGMENTIN Take 1 tablet by mouth 2 (two) times daily for 3 days.   atorvastatin 40 MG tablet Commonly known as:  LIPITOR Take 40 mg by mouth daily.   BD PEN NEEDLE MICRO U/F 32G X 6 MM  Misc Generic drug:  Insulin Pen Needle INJECT INSULIN BEFORE EACH MEAL AND AT BEDTIME.   Cinnamon 500 MG capsule Take by mouth.   ELIQUIS 5 MG Tabs tablet Generic drug:  apixaban Take 5 mg by mouth 2 (two) times daily.   EYE DROPS 0.05 % ophthalmic solution Generic drug:  tetrahydrozoline Place 1 drop into both eyes daily as needed. For dry eye relief   Flax Seed Oil 1000 MG Caps Take by mouth.   furosemide 20 MG tablet Commonly known as:  LASIX TAKE 2 TABLETS (40MG) IN THE MORNING AND 1 TABLET (20MG) BY MOUTH IN THE EVENING.   glucose blood test strip   guaiFENesin 600 MG 12 hr tablet Commonly known as:  MUCINEX Take 2 tablets (1,200 mg total) by mouth 2 (two) times daily for 7 days.   HUMALOG KWIKPEN 100 UNIT/ML KwikPen Generic drug:  insulin lispro Take 20-25 Units by mouth 3 (three) times daily. As directed per sliding scale. To take 25 units upon taking Steroid regimen prior to scheduled treatments   KLOR-CON M20 20 MEQ tablet Generic drug:  potassium chloride SA Take 20 mEq by mouth 2 (two) times daily.   levothyroxine 100 MCG tablet Commonly known as:  SYNTHROID, LEVOTHROID Take 100 mcg by mouth daily before breakfast.   magnesium oxide 400 MG tablet Commonly known as:  MAG-OX Take 1 tablet by mouth  daily.   metoprolol tartrate 25 MG tablet Commonly known as:  LOPRESSOR Take 0.5 tablets (12.5 mg total) by mouth 2 (two) times daily for 30 days. What changed:  how much to take   multivitamin capsule Take by mouth.   nystatin 100000 UNIT/ML suspension Commonly known as:  MYCOSTATIN TAKE 5 ML 4 TIMES A DAY X14 DAY   ondansetron 8 MG tablet Commonly known as:  ZOFRAN TAKE 1 TABLET BY MOUTH ONCE AS NEEDED FOR NAUSEA\   oxyCODONE 5 MG immediate release tablet Commonly known as:  Oxy IR/ROXICODONE Take by mouth.   pediatric multivitamin-iron 15 MG chewable tablet Chew by mouth.   pomalidomide 1 MG capsule Commonly known as:  POMALYST Take 1 capsule  by mouth daily. Take one capsule by mouth daily for 21 days then off for 7   predniSONE 20 MG tablet Commonly known as:  DELTASONE Take 2 tablets (40 mg total) by mouth daily with breakfast for 5 days.   prochlorperazine 10 MG tablet Commonly known as:  COMPAZINE Take 10 mg by mouth every 6 (six) hours as needed for nausea or vomiting.   Vitamin D3 50 MCG (2000 UT) Chew Chew by mouth.      Follow-up Information    Iona Beard, MD. Schedule an appointment as soon as possible for a visit in 1 week(s).   Specialty:  Family Medicine Contact information: [address] [place] [address] [phone]        Arnoldo Lenis, MD. Schedule an appointment as soon as possible for a visit in 2 week(s).   Specialty:  Cardiology Contact information: [address] [place] [address] [phone]        Everardo All, MD. Schedule an appointment as soon as possible for a visit in 3 day(s).   Specialty:  Oncology Why:  Hospital Follow Up  Contact information: [address] [place] [address] [phone]          Allergies  Allergen Reactions  . Doxycycline Rash   Allergies as of 04/04/2018      Reactions   Doxycycline Rash      Medication List    TAKE these medications   acyclovir 400 MG tablet Commonly known as:  ZOVIRAX Take 400 mg by mouth daily.   albuterol 108 (90 Base) MCG/ACT inhaler Commonly known as:  PROVENTIL HFA;VENTOLIN HFA Inhale 2 puffs into the lungs every 4 (four) hours as needed for wheezing or shortness of breath (cough, shortness of breath or wheezing.).   amoxicillin-clavulanate 875-125 MG tablet Commonly known as:  AUGMENTIN Take 1 tablet by mouth 2 (two) times daily for 3 days.   atorvastatin 40 MG tablet Commonly known as:  LIPITOR Take 40 mg by mouth daily.   BD PEN NEEDLE MICRO U/F 32G X 6 MM Misc Generic drug:  Insulin Pen Needle INJECT INSULIN BEFORE EACH MEAL AND AT  BEDTIME.   Cinnamon 500 MG capsule Take by mouth.   ELIQUIS 5 MG Tabs tablet Generic drug:  apixaban Take 5 mg by mouth 2 (two) times daily.   EYE DROPS 0.05 % ophthalmic solution Generic drug:  tetrahydrozoline Place 1 drop into both eyes daily as needed. For dry eye relief   Flax Seed Oil 1000 MG Caps Take by mouth.   furosemide 20 MG tablet Commonly known as:  LASIX TAKE 2 TABLETS (40MG) IN THE MORNING AND 1 TABLET (20MG) BY MOUTH IN THE EVENING.   glucose blood test  strip   guaiFENesin 600 MG 12 hr tablet Commonly known as:  MUCINEX Take 2 tablets (1,200 mg total) by mouth 2 (two) times daily for 7 days.   HUMALOG KWIKPEN 100 UNIT/ML KwikPen Generic drug:  insulin lispro Take 20-25 Units by mouth 3 (three) times daily. As directed per sliding scale. To take 25 units upon taking Steroid regimen prior to scheduled treatments   KLOR-CON M20 20 MEQ tablet Generic drug:  potassium chloride SA Take 20 mEq by mouth 2 (two) times daily.   levothyroxine 100 MCG tablet Commonly known as:  SYNTHROID, LEVOTHROID Take 100 mcg by mouth daily before breakfast.   magnesium oxide 400 MG tablet Commonly known as:  MAG-OX Take 1 tablet by mouth daily.   metoprolol tartrate 25 MG tablet Commonly known as:  LOPRESSOR Take 0.5 tablets (12.5 mg total) by mouth 2 (two) times daily for 30 days. What changed:  how much to take   multivitamin capsule Take by mouth.   nystatin 100000 UNIT/ML suspension Commonly known as:  MYCOSTATIN TAKE 5 ML 4 TIMES A DAY X14 DAY   ondansetron 8 MG tablet Commonly known as:  ZOFRAN TAKE 1 TABLET BY MOUTH ONCE AS NEEDED FOR NAUSEA\   oxyCODONE 5 MG immediate release tablet Commonly known as:  Oxy IR/ROXICODONE Take by mouth.   pediatric multivitamin-iron 15 MG chewable tablet Chew by mouth.   pomalidomide 1 MG capsule Commonly known as:  POMALYST Take 1 capsule by mouth daily. Take one capsule by mouth daily for 21 days then off for 7    predniSONE 20 MG tablet Commonly known as:  DELTASONE Take 2 tablets (40 mg total) by mouth daily with breakfast for 5 days.   prochlorperazine 10 MG tablet Commonly known as:  COMPAZINE Take 10 mg by mouth every 6 (six) hours as needed for nausea or vomiting.   Vitamin D3 50 MCG (2000 UT) Chew Chew by mouth.       Procedures/Studies: Dg Chest 2 View  Result Date: 03/31/2018 CLINICAL DATA:  Cough, wheezing and shortness of breath for several days. EXAM: CHEST - 2 VIEW COMPARISON:  06/24/2016 and prior exams FINDINGS: Cardiomegaly and RIGHT IJ Port-A-Cath again noted. Equivocal LOWER lobe airspace disease identified on the LATERAL view. Mild peribronchial thickening identified bilaterally. There is no evidence of pulmonary edema, suspicious pulmonary nodule/mass, pleural effusion, or pneumothorax. No acute bony abnormalities are identified. IMPRESSION: Equivocal LOWER lobe airspace disease on the LATERAL view which may represent pneumonia. Mild chronic peribronchial thickening. Electronically Signed   By: Margarette Canada M.D.   On: 03/31/2018 13:17     Subjective: Patient reports that she is feeling a lot better today.  She is having a lot less coughing.  She denies shortness of breath and chest pain.  She denies fever and chills.  She has been ambulating.  She is off oxygen.  Discharge Exam: Vitals:   04/04/18 0527 04/04/18 0816  BP: 138/81   Pulse: (!) 42   Resp: 18   Temp: (!) 97.4 F (36.3 C)   SpO2: 100% 98%   Vitals:   04/04/18 0112 04/04/18 0116 04/04/18 0527 04/04/18 0816  BP:   138/81   Pulse:   (!) 42   Resp:   18   Temp:   (!) 97.4 F (36.3 C)   TempSrc:   Oral   SpO2: 99% 99% 100% 98%  Weight:      Height:       General exam:  awake, alert,  NAD. Cooperative.   Respiratory system: BBS Clear. No increased work of breathing. Cardiovascular system: S1 & S2 heard, irregularly irregular. mild JVD, No murmurs, gallops, clicks,  trace pedal edema. Gastrointestinal  system: Abdomen is nondistended, soft and nontender. Normal bowel sounds heard. Central nervous system: Alert and oriented. No focal neurological deficits. Extremities: 2+ edema BLEs (chronic)   The results of significant diagnostics from this hospitalization (including imaging, microbiology, ancillary and laboratory) are listed below for reference.     Microbiology: Recent Results (from the past 240 hour(s))  Blood culture (routine x 2)     Status: None (Preliminary result)   Collection Time: 03/31/18  3:11 PM  Result Value Ref Range Status   Specimen Description   Final    RIGHT ANTECUBITAL BOTTLES DRAWN AEROBIC AND ANAEROBIC   Special Requests Blood Culture adequate volume  Final   Culture   Final    NO GROWTH 4 DAYS Performed at [facility], [address] [place], [place] [address]    Report Status PENDING  Incomplete  Blood culture (routine x 2)     Status: None (Preliminary result)   Collection Time: 03/31/18  3:16 PM  Result Value Ref Range Status   Specimen Description BLOOD LEFT ARM BOTTLES DRAWN AEROBIC ONLY  Final   Special Requests Blood Culture adequate volume  Final   Culture   Final    NO GROWTH 4 DAYS Performed at [facility], [address] [place], [place] [address]    Report Status PENDING  Incomplete  MRSA PCR Screening     Status: None   Collection Time: 03/31/18 10:34 PM  Result Value Ref Range Status   MRSA by PCR NEGATIVE NEGATIVE Final    Comment:        The GeneXpert MRSA Assay (FDA approved for NASAL specimens only), is one component of a comprehensive MRSA colonization surveillance program. It is not intended to diagnose MRSA infection nor to guide or monitor treatment for MRSA infections. Performed at [facility], [address], [place], [place] [address]      Labs: BNP (last 3 results) Recent Labs    03/31/18 1235  BNP 579.7*   Basic Metabolic Panel: Recent Labs  Lab 03/31/18 1235 04/01/18 0429 04/03/18 0757 04/04/18 0932   NA 136 137 136 137  K 3.5 4.7 5.2* 4.9  CL 102 105 105 104  CO2 _0 GLUCOSE 161* 180* 193* 231*  BUN 18 23 33* 38*  CREATININE 1.21* 1.40* 1.30* 1.22*  CALCIUM 8.7* 8.6* 8.8* 9.1  MG  --   --  2.3  --   PHOS  --   --   --  3.7   Liver Function Tests: Recent Labs  Lab 03/31/18 1235 04/01/18 0429 04/03/18 0757 04/04/18 0932  AST _1 --   ALT _2 --   ALKPHOS 189* 179* 142*  --   BILITOT 0.6 0.4 0.3  --   PROT 6.3* 6.6 6.1*  --   ALBUMIN 2.7* 2.8* 2.8* 2.9*   No results for input(s): LIPASE, AMYLASE in the last 168 hours. No results for input(s): AMMONIA in the last 168 hours. CBC: Recent Labs  Lab 03/31/18 1235 04/01/18 0429 04/03/18 0757  WBC 2.2* 1.1* 1.7*  NEUTROABS 1.1*  --  1.2*  HGB 9.4* 9.3* 9.3*  HCT 32.4* 32.6* 32.8*  MCV 84.4 86.0 86.8  PLT 168 155 188   Cardiac Enzymes: Recent Labs  Lab 03/31/18  [place] <0.03   BNP: Invalid input(s): POCBNP CBG: Recent Labs  Lab 04/03/18 0747 04/03/18 1126 04/03/18 1621 04/03/18 2210 04/04/18 0733  GLUCAP 179* 198* 177* 157* 176*   D-Dimer No results for input(s): DDIMER in the last 72 hours. Hgb A1c No results for input(s): HGBA1C in the last 72 hours. Lipid Profile No results for input(s): CHOL, HDL, LDLCALC, TRIG, CHOLHDL, LDLDIRECT in the last 72 hours. Thyroid function studies No results for input(s): TSH, T4TOTAL, T3FREE, THYROIDAB in the last 72 hours.  Invalid input(s): FREET3 Anemia work up No results for input(s): VITAMINB12, FOLATE, FERRITIN, TIBC, IRON, RETICCTPCT in the last 72 hours. Urinalysis    Component Value Date/Time   COLORURINE YELLOW 09/17/2014 1730   APPEARANCEUR CLEAR 09/17/2014 1730   LABSPEC 1.025 09/17/2014 1730   PHURINE 5.5 09/17/2014 1730   GLUCOSEU NEGATIVE 09/17/2014 1730   HGBUR NEGATIVE 09/17/2014 1730   BILIRUBINUR NEGATIVE 09/17/2014 1730   KETONESUR NEGATIVE 09/17/2014 1730   PROTEINUR NEGATIVE 09/17/2014 1730   UROBILINOGEN  0.2 09/17/2014 1730   NITRITE NEGATIVE 09/17/2014 1730   LEUKOCYTESUR NEGATIVE 09/17/2014 1730   Sepsis Labs Invalid input(s): PROCALCITONIN,  WBC,  LACTICIDVEN Microbiology Recent Results (from the past 240 hour(s))  Blood culture (routine x 2)     Status: None (Preliminary result)   Collection Time: 03/31/18  3:11 PM  Result Value Ref Range Status   Specimen Description   Final    RIGHT ANTECUBITAL BOTTLES DRAWN AEROBIC AND ANAEROBIC   Special Requests Blood Culture adequate volume  Final   Culture   Final    NO GROWTH 4 DAYS Performed at [facility], [address] [place], [place] [address]    Report Status PENDING  Incomplete  Blood culture (routine x 2)     Status: None (Preliminary result)   Collection Time: 03/31/18  3:16 PM  Result Value Ref Range Status   Specimen Description BLOOD LEFT ARM BOTTLES DRAWN AEROBIC ONLY  Final   Special Requests Blood Culture adequate volume  Final   Culture   Final    NO GROWTH 4 DAYS Performed at [facility], [address], [place], [place] [address]    Report Status PENDING  Incomplete  MRSA PCR Screening     Status: None   Collection Time: 03/31/18 10:34 PM  Result Value Ref Range Status   MRSA by PCR NEGATIVE NEGATIVE Final    Comment:        The GeneXpert MRSA Assay (FDA approved for NASAL specimens only), is one component of a comprehensive MRSA colonization surveillance program. It is not intended to diagnose MRSA infection nor to guide or monitor treatment for MRSA infections. Performed at [facility], [address], [place], [place] [address]    Time coordinating discharge: 33 minutes   SIGNED:  Irwin Brakeman, MD  [facility] 04/04/2018, 10:41 AM

## 2018-04-04 NOTE — Progress Notes (Signed)
Pt discharged to home in stable condition.  All discharge instructions reviewed with and given to pt.  Pt's right chest wall port de-accessed without difficulty with needle intact, pt tolerated well.  Pt transported off unit via wheelchair with transporters x 1 at chairside.  [id number]

## 2018-04-05 LAB — CULTURE, BLOOD (ROUTINE X 2)
Culture: NO GROWTH
Culture: NO GROWTH
Special Requests: ADEQUATE
Special Requests: ADEQUATE

## 2018-04-11 DIAGNOSIS — C9000 Multiple myeloma not having achieved remission: Secondary | ICD-10-CM | POA: Diagnosis not present

## 2018-04-11 DIAGNOSIS — T451X5A Adverse effect of antineoplastic and immunosuppressive drugs, initial encounter: Secondary | ICD-10-CM | POA: Diagnosis not present

## 2018-04-11 DIAGNOSIS — D6481 Anemia due to antineoplastic chemotherapy: Secondary | ICD-10-CM | POA: Diagnosis not present

## 2018-04-11 DIAGNOSIS — D61818 Other pancytopenia: Secondary | ICD-10-CM | POA: Diagnosis not present

## 2018-04-18 DIAGNOSIS — D6481 Anemia due to antineoplastic chemotherapy: Secondary | ICD-10-CM | POA: Diagnosis not present

## 2018-04-18 DIAGNOSIS — T451X5A Adverse effect of antineoplastic and immunosuppressive drugs, initial encounter: Secondary | ICD-10-CM | POA: Diagnosis not present

## 2018-04-18 DIAGNOSIS — D61818 Other pancytopenia: Secondary | ICD-10-CM | POA: Diagnosis not present

## 2018-04-18 DIAGNOSIS — C9000 Multiple myeloma not having achieved remission: Secondary | ICD-10-CM | POA: Diagnosis not present

## 2018-05-01 DIAGNOSIS — I10 Essential (primary) hypertension: Secondary | ICD-10-CM | POA: Diagnosis not present

## 2018-05-01 DIAGNOSIS — E039 Hypothyroidism, unspecified: Secondary | ICD-10-CM | POA: Diagnosis not present

## 2018-05-01 DIAGNOSIS — C9002 Multiple myeloma in relapse: Secondary | ICD-10-CM | POA: Diagnosis not present

## 2018-05-01 DIAGNOSIS — E118 Type 2 diabetes mellitus with unspecified complications: Secondary | ICD-10-CM | POA: Diagnosis not present

## 2018-05-10 ENCOUNTER — Ambulatory Visit: Payer: Medicare Other | Admitting: Podiatry

## 2018-05-11 DIAGNOSIS — D472 Monoclonal gammopathy: Secondary | ICD-10-CM | POA: Diagnosis not present

## 2018-05-11 DIAGNOSIS — R6889 Other general symptoms and signs: Secondary | ICD-10-CM | POA: Diagnosis not present

## 2018-05-11 DIAGNOSIS — I10 Essential (primary) hypertension: Secondary | ICD-10-CM | POA: Diagnosis not present

## 2018-05-11 DIAGNOSIS — I5022 Chronic systolic (congestive) heart failure: Secondary | ICD-10-CM | POA: Diagnosis not present

## 2018-05-11 DIAGNOSIS — Z9481 Bone marrow transplant status: Secondary | ICD-10-CM | POA: Diagnosis not present

## 2018-05-11 DIAGNOSIS — I429 Cardiomyopathy, unspecified: Secondary | ICD-10-CM | POA: Diagnosis not present

## 2018-05-11 DIAGNOSIS — C9000 Multiple myeloma not having achieved remission: Secondary | ICD-10-CM | POA: Diagnosis not present

## 2018-05-11 DIAGNOSIS — Z23 Encounter for immunization: Secondary | ICD-10-CM | POA: Diagnosis not present

## 2018-05-23 DIAGNOSIS — Z5111 Encounter for antineoplastic chemotherapy: Secondary | ICD-10-CM | POA: Diagnosis not present

## 2018-05-23 DIAGNOSIS — C9000 Multiple myeloma not having achieved remission: Secondary | ICD-10-CM | POA: Diagnosis not present

## 2018-05-23 DIAGNOSIS — D701 Agranulocytosis secondary to cancer chemotherapy: Secondary | ICD-10-CM | POA: Diagnosis not present

## 2018-05-23 DIAGNOSIS — T451X5A Adverse effect of antineoplastic and immunosuppressive drugs, initial encounter: Secondary | ICD-10-CM | POA: Diagnosis not present

## 2018-05-23 DIAGNOSIS — D696 Thrombocytopenia, unspecified: Secondary | ICD-10-CM | POA: Diagnosis not present

## 2018-05-29 ENCOUNTER — Ambulatory Visit: Payer: Self-pay | Admitting: Cardiology

## 2018-06-06 DIAGNOSIS — D701 Agranulocytosis secondary to cancer chemotherapy: Secondary | ICD-10-CM | POA: Diagnosis not present

## 2018-06-06 DIAGNOSIS — T451X5A Adverse effect of antineoplastic and immunosuppressive drugs, initial encounter: Secondary | ICD-10-CM | POA: Diagnosis not present

## 2018-06-06 DIAGNOSIS — C9000 Multiple myeloma not having achieved remission: Secondary | ICD-10-CM | POA: Diagnosis not present

## 2018-06-06 DIAGNOSIS — D696 Thrombocytopenia, unspecified: Secondary | ICD-10-CM | POA: Diagnosis not present

## 2018-06-09 ENCOUNTER — Encounter: Payer: Self-pay | Admitting: Podiatry

## 2018-06-09 ENCOUNTER — Other Ambulatory Visit: Payer: Self-pay

## 2018-06-09 ENCOUNTER — Ambulatory Visit (INDEPENDENT_AMBULATORY_CARE_PROVIDER_SITE_OTHER): Payer: Medicare Other | Admitting: Podiatry

## 2018-06-09 DIAGNOSIS — E1142 Type 2 diabetes mellitus with diabetic polyneuropathy: Secondary | ICD-10-CM | POA: Diagnosis not present

## 2018-06-09 DIAGNOSIS — B351 Tinea unguium: Secondary | ICD-10-CM | POA: Diagnosis not present

## 2018-06-09 DIAGNOSIS — M79675 Pain in left toe(s): Secondary | ICD-10-CM

## 2018-06-09 DIAGNOSIS — M79674 Pain in right toe(s): Secondary | ICD-10-CM | POA: Diagnosis not present

## 2018-06-09 DIAGNOSIS — Q6650 Congenital pes planus, unspecified foot: Secondary | ICD-10-CM

## 2018-06-09 NOTE — Progress Notes (Signed)
This patient presents the office today for preventative foot care services. She states her nails have grown thick and long and are causing pain and discomfort walking and wearing her shoes. She also admits the callus is no longer painful. She admits to being a diabetic and having neuropathy in her feet. She does admit that part of the neuropathy is due to the side effects of her multiple myeloma medication.. She presents the office today for an evaluation and treatment of her feet.    Physical examination.  DP pulses palpable  B/L.  PT pulses non palpable due to swelling  Capillary return within normal limits. Temperature gradient within normal limits   Neurologic  Diminished semmes -weinstein test.  Musculoskeletal.  Hammertoes are present. Muscle power is within normal limits. No pain crepitus or limitation of motion in the feet noted.  Pes planus foot type. Pain 1st MPJ right greater than left.  Palpable pain at the insertion plantar fascia left foot.  NAILS  thick disfigured discolored nails with subungual debris noted  SKIN  No evidence of callus or infection or ulcer.    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     [name] RTC 3 months for preventive footcare services.     Gardiner Barefoot DPM

## 2018-06-15 DIAGNOSIS — Z7689 Persons encountering health services in other specified circumstances: Secondary | ICD-10-CM | POA: Diagnosis not present

## 2018-06-21 ENCOUNTER — Other Ambulatory Visit: Payer: Self-pay

## 2018-06-21 ENCOUNTER — Encounter (HOSPITAL_COMMUNITY): Payer: Self-pay | Admitting: Emergency Medicine

## 2018-06-21 ENCOUNTER — Emergency Department (HOSPITAL_COMMUNITY)
Admission: EM | Admit: 2018-06-21 | Discharge: 2018-06-21 | Disposition: A | Discharge status: Home / Self Care | Payer: Medicare Other | Attending: Emergency Medicine | Admitting: Emergency Medicine

## 2018-06-21 DIAGNOSIS — N183 Chronic kidney disease, stage 3 (moderate): Secondary | ICD-10-CM | POA: Insufficient documentation

## 2018-06-21 DIAGNOSIS — I5042 Chronic combined systolic (congestive) and diastolic (congestive) heart failure: Secondary | ICD-10-CM | POA: Diagnosis not present

## 2018-06-21 DIAGNOSIS — Z794 Long term (current) use of insulin: Secondary | ICD-10-CM | POA: Insufficient documentation

## 2018-06-21 DIAGNOSIS — E1122 Type 2 diabetes mellitus with diabetic chronic kidney disease: Secondary | ICD-10-CM | POA: Insufficient documentation

## 2018-06-21 DIAGNOSIS — Z79899 Other long term (current) drug therapy: Secondary | ICD-10-CM | POA: Insufficient documentation

## 2018-06-21 DIAGNOSIS — R002 Palpitations: Secondary | ICD-10-CM | POA: Diagnosis not present

## 2018-06-21 DIAGNOSIS — Z7901 Long term (current) use of anticoagulants: Secondary | ICD-10-CM | POA: Diagnosis not present

## 2018-06-21 DIAGNOSIS — E1140 Type 2 diabetes mellitus with diabetic neuropathy, unspecified: Secondary | ICD-10-CM | POA: Diagnosis not present

## 2018-06-21 DIAGNOSIS — I130 Hypertensive heart and chronic kidney disease with heart failure and stage 1 through stage 4 chronic kidney disease, or unspecified chronic kidney disease: Secondary | ICD-10-CM | POA: Diagnosis not present

## 2018-06-21 DIAGNOSIS — E039 Hypothyroidism, unspecified: Secondary | ICD-10-CM | POA: Diagnosis not present

## 2018-06-21 DIAGNOSIS — R001 Bradycardia, unspecified: Secondary | ICD-10-CM | POA: Diagnosis present

## 2018-06-21 LAB — BASIC METABOLIC PANEL
Anion gap: 8 (ref 5–15)
BUN: 27 mg/dL — ABNORMAL HIGH (ref 8–23)
CO2: 23 mmol/L (ref 22–32)
Calcium: 9.4 mg/dL (ref 8.9–10.3)
Chloride: 108 mmol/L (ref 98–111)
Creatinine, Ser: 1.47 mg/dL — ABNORMAL HIGH (ref 0.44–1.00)
GFR calc Af Amer: 41 mL/min — ABNORMAL LOW (ref >60)
GFR calc non Af Amer: 36 mL/min — ABNORMAL LOW (ref >60)
Glucose, Bld: 95 mg/dL (ref 70–99)
Potassium: 4.8 mmol/L (ref 3.5–5.1)
Sodium: 139 mmol/L (ref 135–145)

## 2018-06-21 LAB — CBC WITH DIFFERENTIAL/PLATELET
Abs Immature Granulocytes: 0 10*3/uL (ref 0.00–0.07)
Basophils Absolute: 0 10*3/uL (ref 0.0–0.1)
Basophils Relative: 1 %
Eosinophils Absolute: 0 10*3/uL (ref 0.0–0.5)
Eosinophils Relative: 1 %
HCT: 36.2 % (ref 36.0–46.0)
Hemoglobin: 10.2 g/dL — ABNORMAL LOW (ref 12.0–15.0)
Immature Granulocytes: 0 %
Lymphocytes Relative: 31 %
Lymphs Abs: 0.9 10*3/uL (ref 0.7–4.0)
MCH: 24.7 pg — ABNORMAL LOW (ref 26.0–34.0)
MCHC: 28.2 g/dL — ABNORMAL LOW (ref 30.0–36.0)
MCV: 87.7 fL (ref 80.0–100.0)
Monocytes Absolute: 0.3 10*3/uL (ref 0.1–1.0)
Monocytes Relative: 11 %
Neutro Abs: 1.6 10*3/uL — ABNORMAL LOW (ref 1.7–7.7)
Neutrophils Relative %: 56 %
Platelets: 127 10*3/uL — ABNORMAL LOW (ref 150–400)
RBC: 4.13 MIL/uL (ref 3.87–5.11)
RDW: 19.1 % — ABNORMAL HIGH (ref 11.5–15.5)
WBC: 2.7 10*3/uL — ABNORMAL LOW (ref 4.0–10.5)
nRBC: 0 % (ref 0.0–0.2)

## 2018-06-21 LAB — MAGNESIUM: Magnesium: 2.1 mg/dL (ref 1.7–2.4)

## 2018-06-21 NOTE — ED Triage Notes (Signed)
Pt states that her heart rate have been going up and down.

## 2018-06-23 ENCOUNTER — Telehealth (HOSPITAL_COMMUNITY): Payer: Self-pay | Admitting: Licensed Clinical Social Worker

## 2018-06-23 NOTE — ED Provider Notes (Signed)
[facility] EMERGENCY DEPARTMENT Provider Note   CSN: [id number] Arrival date & time: 06/21/18  1831    History   Chief Complaint Chief Complaint  Patient presents with  . Bradycardia    HPI [name] is a 73 y.o. female.     HPI   73 year old female presenting because her heart rate is erratic.  She noted that her pulse had been fluctuating between 40 and 90 when checked on a home machine.  She denies any symptoms with it though.  No chest pain or acute pain otherwise.  No dyspnea.  No cough.  No fevers or chills.  No unusual leg pain or swelling.  She reports compliance with her medications.  No recent changes.  Past Medical History:  Diagnosis Date  . Anemia    takes Ferrous Fumarate 2 times week  . Arthritis    ankles  . Blood transfusion    hx of-43yr ago  . Cardiomyopathy secondary   . Cataract    bilateral and immature  . CHF (congestive heart failure) ([place]    takes Lasix prn, Echo 2015 with EF 40%  . Chronic back pain   . Diabetes mellitus    takes Glipizide and Metformin daily  . Dysrhythmia    pt takes Digoxin and Carvedilol daily  . GERD (gastroesophageal reflux disease)    takes Protonix seldom  . Gout    hx of x 1 time in Aug 2012  . H/O hiatal hernia   . Hemorrhoid   . Hypertension    takes Diovan and Spirololactone daily  . Left leg pain   . Multiple myeloma (HCC)    IgA myeloma; [facility] . Overweight(278.02)   . Peripheral neuropathy   . Pneumonia    hx of;many yrs ago  . Sleep apnea    doesn't use CPAP  . Systolic heart failure    Chronic  . Thyroid nodule    right    Patient Active Problem List   Diagnosis Date Noted  . Acute respiratory failure with hypoxia (HTerra Alta 03/31/2018  . Hypothyroidism 03/31/2018  . PAF (paroxysmal atrial fibrillation) (HDickerson City 03/31/2018  . Pneumonia 03/31/2018  . Acute respiratory failure (HClare 03/31/2018  . Community acquired pneumonia of left lower lobe of lung  (HMillry   . Acute bronchitis 06/19/2016  . SOB (shortness of breath) 06/19/2016  . Neutropenia (HClarke 06/19/2016  . Disease of thyroid gland 07/25/2015  . Personal history of other medical treatment 07/25/2015  . 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 Anemia associated with chemotherapy 11/26/2014  . Hematoma 10/23/2014  . Hypotension 09/17/2014  . Chronic systolic congestive heart failure (HBroadwater 09/17/2014  . AKI (acute kidney injury) (HMaury 09/17/2014  . Diabetes mellitus with complication (HBrandywine 017/51/0258 . HLD (hyperlipidemia) 09/17/2014  . Multiple myeloma (HNew Summerfield 09/17/2014  . Chest pain 09/17/2014  . Elevated troponin   . Fall at home   . Thrombocytopenia (HRamseur   . Encounter for antineoplastic chemotherapy 09/04/2014  . Post menopausal syndrome 08/07/2014  . Acquired pancytopenia (HGrayridge 07/30/2014  . Nodular adrenal cortex (HMooresville 06/16/2014  . Arthritis, degenerative 06/16/2014  . Diabetes mellitus (HPolk 06/16/2014  . Gastro-esophageal reflux disease without esophagitis 06/16/2014  . H/O deep venous thrombosis 06/16/2014  . H/O pneumothorax 06/16/2014  . Personal history of other diseases of the circulatory system 06/16/2014  . BP (high blood pressure) 06/16/2014  . Obstructive apnea 06/16/2014  . History of appendectomy 06/16/2014  .  History of cholecystectomy 06/16/2014  . History of surgical procedure 06/16/2014  . Bariatric surgery status 06/16/2014  . H/O: hysterectomy 06/16/2014  . H/O tubal ligation 06/16/2014  . 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 Chronic congestive heart failure ([place]) 05/01/2014  . IgA myeloma ([place]) 05/01/2014  . Abnormal presence of protein in urine 03/27/2014  . Impaired renal function 03/27/2014  . Stiffness of joint, ankle and foot 01/09/2014  . Stiffness of joint of left pelvic region and thigh 01/09/2014  . Stiffness of joint of right pelvic region and thigh 01/09/2014  . Pain in joint, pelvic region and thigh  01/09/2014  . Muscle weakness (generalized) 01/09/2014  . Pain in joint, ankle and foot 01/09/2014  . Acute on chronic combined systolic and diastolic heart failure ([place]) 10/25/2013  . Microcytic anemia 10/25/2013  . CKD (chronic kidney disease) stage 3, GFR 30-59 ml/min (HCC) 10/25/2013  . Chronic kidney disease (CKD), stage III (moderate) ([place]) 10/25/2013  . Pleuritic chest pain 10/24/2013  . S/P partial thyroidectomy -Right 07/28/2011  . History of partial thyroidectomy 07/28/2011  . GASTROPARESIS 06/02/2010  . PALPITATIONS, OCCASIONAL 04/23/2010  . DIABETES MELLITUS 09/18/2008  . Morbid obesity ([place]) 09/18/2008  . Essential hypertension 09/18/2008  . CARDIOMYOPATHY, SECONDARY 09/18/2008  . SYSTOLIC HEART FAILURE, CHRONIC 09/18/2008    Past Surgical History:  Procedure Laterality Date  . ABDOMINAL HYSTERECTOMY  1991  . APPENDECTOMY  1991  . CARDIAC DEFIBRILLATOR PLACEMENT  >17yr ago  . Cardiac defirillator removed     removed <318monthremoved d/t shocking pt 15times;pierced sac of heart 1000cc ;drained  . CHOLECYSTECTOMY OPEN  1992  . COLONOSCOPY    . COLONOSCOPY N/A 02/08/2014   slf: 1. NO SOURCE FOR ANEMIA IDENTIFIED. 2. SINGLE polyp removed 3. Moderate divertculosis in the sigmoid colon and descencing colon. 4. Moderate sized internal hemorrhoids.   . Marland Kitchenefibrillator removed    . ESOPHAGOGASTRODUODENOSCOPY N/A 02/08/2014   SLF: No source for anemia identified 2. Black foreign body in cardia 3. Mild erosive gastriitis (inflammaion0 was found in the gastric antrum; multiple biopsies were performed.   . leg lipoma    . 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 ovarian cyst removed  1991  . stomach stapling surgery  80's  . THYROID LOBECTOMY  07/27/2011   Procedure: THYROID LOBECTOMY;  Surgeon: ErGayland CurryMD,FACS;  [facility]: General;  Laterality: Right;  . TUBAL LIGATION  1971  . tummy tuck  1991     OB History    Gravida  2   Para  2   Term  2    Preterm      AB      Living        SAB      TAB      Ectopic      Multiple      Live Births               Home Medications    Prior to Admission medications   Medication Sig Start Date End Date Taking? Authorizing Provider  acyclovir (ZOVIRAX) 400 MG tablet Take 400 mg by mouth daily. 01/05/18  Yes [provider]  albuterol (PROVENTIL HFA;VENTOLIN HFA) 108 (90 Base) MCG/ACT inhaler Inhale 2 puffs into the lungs every 4 (four) hours as needed for wheezing or shortness of breath (cough, shortness of breath or wheezing.). 04/04/18  Yes Johnson, Clanford L, MD  apixaban (ELIQUIS) 5 MG TABS tablet Take 5 mg by mouth  2 (two) times daily.  01/15/18  Yes [provider]  atorvastatin (LIPITOR) 40 MG tablet Take 40 mg by mouth every evening.    Yes [provider]  Cholecalciferol (VITAMIN D3) 50 MCG (2000 UT) CHEW Chew by mouth.   Yes [provider]  Flaxseed, Linseed, (FLAX SEED OIL) 1000 MG CAPS Take 1 capsule by mouth daily.    Yes [provider]  furosemide (LASIX) 20 MG tablet TAKE 2 TABLETS ('40MG'$ ) IN THE MORNING AND 1 TABLET ('20MG'$ ) BY MOUTH IN THE EVENING. Patient taking differently: Take 20-40 mg by mouth daily as needed for fluid or edema.  03/13/18  Yes Lendon Colonel, NP  insulin lispro (HUMALOG KWIKPEN) 100 UNIT/ML KwikPen Inject 10-20 Units into the skin 3 (three) times daily before meals. By sliding scale-patient adjusts as necessary 12/06/17  Yes [provider]  KLOR-CON M20 20 MEQ tablet Take 20 mEq by mouth 2 (two) times daily. 12/12/17  Yes [provider]  levothyroxine (SYNTHROID, LEVOTHROID) 150 MCG tablet Take 150 mcg by mouth daily before breakfast.    Yes [provider]  magnesium oxide (MAG-OX) 400 MG tablet Take 1 tablet by mouth daily. 01/05/18  Yes [provider]  metoprolol tartrate (LOPRESSOR) 25 MG tablet Take 0.5 tablets (12.5 mg total) by mouth 2 (two) times daily  for 30 days. 04/04/18 06/21/18 Yes Johnson, Clanford L, MD  oxyCODONE (OXY IR/ROXICODONE) 5 MG immediate release tablet Take 5 mg by mouth daily as needed for moderate pain or severe pain.  12/06/17  Yes [provider]  pomalidomide (POMALYST) 1 MG capsule Take 1 mg by mouth daily. TO TAKE FOR 7 DAYS THEN STOP FOR 7 DAYS, THEN REPEAT 04/28/18  Yes [provider]  potassium chloride (K-DUR) 10 MEQ tablet Take 10 mEq by mouth 2 (two) times daily.   Yes [provider]  SYNTHROID 150 MCG tablet Take 150 mcg by mouth daily before breakfast.  05/17/18  Yes [provider]  tetrahydrozoline (EYE DROPS) 0.05 % ophthalmic solution Place 1 drop into both eyes 3 (three) times daily as needed (FOR DRY EYE RELIEF).    Yes [provider]  vitamin B-12 (CYANOCOBALAMIN) 250 MCG tablet Take 250 mcg by mouth 3 (three) times a week.   Yes [provider]    Family History Family History  Problem Relation Age of Onset  . Kidney failure Mother   . Heart failure Other   . Anesthesia problems Neg Hx   . Hypotension Neg Hx   . Malignant hyperthermia Neg Hx   . Pseudochol deficiency Neg Hx     Social History Social History   Tobacco Use  . Smoking status: Never Smoker  . Smokeless tobacco: Never Used  Substance Use Topics  . Alcohol use: Yes    Alcohol/week: 0.0 standard drinks    Comment: glass wine occasionally  . Drug use: No     Allergies   Doxycycline   Review of Systems Review of Systems  All systems reviewed and negative, other than as noted in HPI.  Physical Exam Updated Vital Signs BP 94/64   Pulse (!) 52   Temp 97.8 F (36.6 C) (Oral)   Resp (!) 23   Ht '5\' 4"'$  (1.626 m)   Wt (!) 136.5 kg   SpO2 100%   BMI 51.67 kg/m   Physical Exam Vitals signs and nursing note reviewed.  Constitutional:      General: She is not in acute distress.  Appearance: She is well-developed. She is obese.  HENT:     Head: Normocephalic and  atraumatic.  Eyes:     General:        Right eye: No discharge.        Left eye: No discharge.     Conjunctiva/sclera: Conjunctivae normal.  Neck:     Musculoskeletal: Neck supple.  Cardiovascular:     Rate and Rhythm: Normal rate. Rhythm irregular.     Heart sounds: Normal heart sounds. No murmur. No friction rub. No gallop.   Pulmonary:     Effort: Pulmonary effort is normal. No respiratory distress.     Breath sounds: Normal breath sounds.  Abdominal:     General: There is no distension.     Palpations: Abdomen is soft.     Tenderness: There is no abdominal tenderness.  Musculoskeletal:        General: No tenderness.  Skin:    General: Skin is warm and dry.  Neurological:     Mental Status: She is alert.  Psychiatric:        Behavior: Behavior normal.        Thought Content: Thought content normal.      ED Treatments / Results  Labs (all labs ordered are listed, but only abnormal results are displayed) Labs Reviewed  CBC WITH DIFFERENTIAL/PLATELET - Abnormal; Notable for the following components:      Result Value   WBC 2.7 (*)    Hemoglobin 10.2 (*)    MCH 24.7 (*)    MCHC 28.2 (*)    RDW 19.1 (*)    Platelets 127 (*)    Neutro Abs 1.6 (*)    All other components within normal limits  BASIC METABOLIC PANEL - Abnormal; Notable for the following components:   BUN 27 (*)    Creatinine, Ser 1.47 (*)    GFR calc non Af Amer 36 (*)    GFR calc Af Amer 41 (*)    All other components within normal limits  MAGNESIUM    EKG EKG Interpretation  Date/Time:  Wednesday June 21 2018 18:48:15 EDT Ventricular Rate:  66 PR Interval:    QRS Duration: 98 QT Interval:  414 QTC Calculation: 434 R Axis:   -10 Text Interpretation:  Normal sinus rhythm Premature atrial complexes Confirmed by Thayer Jew [phone]) on 06/22/2018 11:11:26 AM   Radiology No results found.  Procedures Procedures (including critical care time)  Medications Ordered in ED Medications -  No data to display   Initial Impression / Assessment and Plan / ED Course  I have reviewed the triage vital signs and the nursing notes.  Pertinent labs & imaging results that were available during my care of the patient were reviewed by me and considered in my medical decision making (see chart for details).        73 year old female with irregular heartbeat.  EKG with atrial fibrillation versus sinus rhythm with frequent ectopy.  Even if atrial fibrillation, she has a known history of this.  She is anticoagulated.  She denies any other symptoms otherwise.  Anemia/leukopenia noted, but this appears to be chronic/stable.  Final Clinical Impressions(s) / ED Diagnoses   Final diagnoses:  Palpitations    ED Discharge Orders    None       Virgel Manifold, MD 06/23/18 1232

## 2018-06-23 NOTE — Telephone Encounter (Signed)
CSW received referral from [facility] to contact patient to inquire about food insecurity during this health crisis. Patient reported need and agreeable to weekly delivery from [employer]. Patient informed that delivery will be left on front door with no face to face contact with delivery person. Patient agreeable to plan and grateful for the assistance. Raquel Sarna, [place], [place]

## 2018-07-01 DIAGNOSIS — R002 Palpitations: Secondary | ICD-10-CM | POA: Diagnosis not present

## 2018-07-04 DIAGNOSIS — Z7689 Persons encountering health services in other specified circumstances: Secondary | ICD-10-CM | POA: Diagnosis not present

## 2018-07-07 ENCOUNTER — Telehealth (HOSPITAL_COMMUNITY): Payer: Self-pay | Admitting: Licensed Clinical Social Worker

## 2018-07-07 NOTE — Telephone Encounter (Signed)
CSW contacted patient to follow up on weekly food delivery package. Patient informed of delivery time and no face to face contact during delivery. Patient verbalizes understanding and grateful for the assistance.  CSW continues to follow for supportive needs. Jackie Sarahmarie Leavey, LCSW, CCSW-MCS [phone]  

## 2018-07-09 DIAGNOSIS — R002 Palpitations: Secondary | ICD-10-CM | POA: Diagnosis not present

## 2018-07-13 DIAGNOSIS — C9000 Multiple myeloma not having achieved remission: Secondary | ICD-10-CM | POA: Diagnosis not present

## 2018-07-13 DIAGNOSIS — I509 Heart failure, unspecified: Secondary | ICD-10-CM | POA: Diagnosis not present

## 2018-07-13 DIAGNOSIS — G4733 Obstructive sleep apnea (adult) (pediatric): Secondary | ICD-10-CM | POA: Diagnosis not present

## 2018-07-13 DIAGNOSIS — I480 Paroxysmal atrial fibrillation: Secondary | ICD-10-CM | POA: Diagnosis not present

## 2018-07-20 DIAGNOSIS — Z7689 Persons encountering health services in other specified circumstances: Secondary | ICD-10-CM | POA: Diagnosis not present

## 2018-08-07 DIAGNOSIS — E1122 Type 2 diabetes mellitus with diabetic chronic kidney disease: Secondary | ICD-10-CM | POA: Diagnosis not present

## 2018-08-07 DIAGNOSIS — E559 Vitamin D deficiency, unspecified: Secondary | ICD-10-CM | POA: Diagnosis not present

## 2018-08-07 DIAGNOSIS — I509 Heart failure, unspecified: Secondary | ICD-10-CM | POA: Diagnosis not present

## 2018-08-07 DIAGNOSIS — N183 Chronic kidney disease, stage 3 (moderate): Secondary | ICD-10-CM | POA: Diagnosis not present

## 2018-08-08 DIAGNOSIS — C9000 Multiple myeloma not having achieved remission: Secondary | ICD-10-CM | POA: Diagnosis not present

## 2018-08-18 DIAGNOSIS — G4733 Obstructive sleep apnea (adult) (pediatric): Secondary | ICD-10-CM | POA: Diagnosis not present

## 2018-08-22 ENCOUNTER — Telehealth: Payer: Self-pay

## 2018-08-22 NOTE — Telephone Encounter (Signed)

## 2018-08-23 ENCOUNTER — Telehealth (INDEPENDENT_AMBULATORY_CARE_PROVIDER_SITE_OTHER): Payer: Medicare Other | Admitting: Cardiology

## 2018-08-23 ENCOUNTER — Encounter: Payer: Self-pay | Admitting: Cardiology

## 2018-08-23 ENCOUNTER — Other Ambulatory Visit: Payer: Self-pay

## 2018-08-23 VITALS — BP 128/64 | HR 69 | Ht 64.0 in | Wt 306.0 lb

## 2018-08-23 DIAGNOSIS — I428 Other cardiomyopathies: Secondary | ICD-10-CM

## 2018-08-23 DIAGNOSIS — R001 Bradycardia, unspecified: Secondary | ICD-10-CM | POA: Diagnosis not present

## 2018-08-23 DIAGNOSIS — I4891 Unspecified atrial fibrillation: Secondary | ICD-10-CM | POA: Diagnosis not present

## 2018-08-23 DIAGNOSIS — I10 Essential (primary) hypertension: Secondary | ICD-10-CM | POA: Diagnosis not present

## 2018-08-23 NOTE — Progress Notes (Signed)
Medication Instructions:  Your physician recommends that you continue on your current medications as directed. Please refer to the Current Medication list given to you today.   Labwork: None   Testing/Procedures: none  Follow-Up: Your physician recommends that you schedule a follow-up appointment in: 4 months   Any Other Special Instructions Will Be Listed Below (If Applicable).     If you need a refill on your cardiac medications before your next appointment, please call your pharmacy.

## 2018-08-23 NOTE — Progress Notes (Signed)
Virtual Visit via Telephone Note   This visit type was conducted due to national recommendations for restrictions regarding the COVID-19 Pandemic (e.g. social distancing) in an effort to limit this patient's exposure and mitigate transmission in our community.  Due to her co-morbid illnesses, this patient is at least at moderate risk for complications without adequate follow up.  This format is felt to be most appropriate for this patient at this time.  The patient did not have access to video technology/had technical difficulties with video requiring transitioning to audio format only (telephone).  All issues noted in this document were discussed and addressed.  No physical exam could be performed with this format.  Please refer to the patient's chart for her  consent to telehealth for [facility].   Date:  08/23/2018   ID:  [name], DOB 14-Apr-1945, MRN [id number]  Patient Location: Home Provider Location: Home  PCP:  Iona Beard, MD  Cardiologist:  Carlyle Dolly, MD  Electrophysiologist:  None   Evaluation Performed:  Follow-Up Visit  Chief Complaint:  4 month follow up  History of Present Illness:    [name] is a 73 y.o. female seen today for follow up of the following medical problems.   1. NICM  - echo 07/2013 LVEF 40-45%, mod to severe LVH.  - previously had ICD, had troubles with lead dislodgment and inappropriate shocks in the past and device was removed. Her LVEF has improved and she has not required consideration for another device.  04/2017 echo LVEF 32-20%, diastolic function not reported.  - takes lasix 2-3 times per week, reports no significant edema. Weight loss of 7 lbs since last visit   2. HTN  -remains compliant with meds   3. Multiple myeloma - followed by Dr Tressie Stalker in [place] and Dr Melba Coon at [facility].  - recent bone marrow transplant in [place].   4. CKD - followed by renal Dr Hinda Lenis   5. OSA  -followed by DrHawkins -started CPAP machine.    6. Afib - new diagnosis during admission in [place] for bone marrow transplant, awaiting records. Could tell she was out of rhythm.  - started on eliquis.   - no recnet palpitatoins. Lopressor lowered due to some recent low heart rates   7. Bradycardia - seen in ER 06/2018 for bradycardia - noted HR's varying from 40s to 90s by home monitor - EKG showed wandering atrial pacemaker  - during Jan 2020 admission noted to have some bradycardia, lopressor lowered to 12.60m bid. Had some afib with RVR that admit, transiently on amio   [facility]. - has echo pending  The patient does not have symptoms concerning for COVID-19 infection (fever, chills, cough, or new shortness of breath).    Past Medical History:  Diagnosis Date  . Anemia    takes Ferrous Fumarate 2 times week  . Arthritis    ankles  . Blood transfusion    hx of-389yrago  . Cardiomyopathy secondary   . Cataract    bilateral and immature  . CHF (congestive heart failure) (HCFl[place]   takes Lasix prn, Echo 2015 with EF 40%  . Chronic back pain   . Diabetes mellitus    takes Glipizide and Metformin daily  . Dysrhythmia    pt takes Digoxin and Carvedilol daily  . GERD (gastroesophageal reflux disease)    takes Protonix seldom  . Gout  hx of x 1 time in Aug 2012  . H/O hiatal hernia   . Hemorrhoid   . Hypertension    takes Diovan and Spirololactone daily  . Left leg pain   . Multiple myeloma (HCC)    IgA myeloma; chemo at [place] Ledell Noss), Onc MD at [facility]  . Overweight(278.02)   . Peripheral neuropathy   . Pneumonia    hx of;many yrs ago  . Sleep apnea    doesn't use CPAP  . Systolic heart failure    Chronic  . Thyroid nodule    right   Past Surgical History:  Procedure Laterality Date  . ABDOMINAL HYSTERECTOMY  1991  . APPENDECTOMY  1991  . CARDIAC DEFIBRILLATOR PLACEMENT  >74yr ago   . Cardiac defirillator removed     removed <354monthremoved d/t shocking pt 15times;pierced sac of heart 1000cc ;drained  . CHOLECYSTECTOMY OPEN  1992  . COLONOSCOPY    . COLONOSCOPY N/A 02/08/2014   slf: 1. NO SOURCE FOR ANEMIA IDENTIFIED. 2. SINGLE polyp removed 3. Moderate divertculosis in the sigmoid colon and descencing colon. 4. Moderate sized internal hemorrhoids.   . Marland Kitchenefibrillator removed    . ESOPHAGOGASTRODUODENOSCOPY N/A 02/08/2014   SLF: No source for anemia identified 2. Black foreign body in cardia 3. Mild erosive gastriitis (inflammaion0 was found in the gastric antrum; multiple biopsies were performed.   . leg lipoma    . 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 ovarian cyst removed  1991  . stomach stapling surgery  80's  . THYROID LOBECTOMY  07/27/2011   Procedure: THYROID LOBECTOMY;  Surgeon: ErGayland CurryMD,FACS;  [facility]: [facility];  Laterality: Right;  . TUBAL LIGATION  1971  . tummy tuck  1991     No outpatient medications have been marked as taking for the 08/23/18 encounter (Appointment) with BrArnoldo LenisMD.     Allergies:   Doxycycline   Social History   Tobacco Use  . Smoking status: Never Smoker  . Smokeless tobacco: Never Used  Substance Use Topics  . Alcohol use: Yes    Alcohol/week: 0.0 standard drinks    Comment: glass wine occasionally  . Drug use: No     Family Hx: The patient's family history includes Heart failure in an other family member; Kidney failure in her mother. There is no history of Anesthesia problems, Hypotension, Malignant hyperthermia, or Pseudochol deficiency.  ROS:   Please see the history of present illness.     All other systems reviewed and are negative.   Prior CV studies:   The following studies were reviewed today:  08/07/13 Echo Study Conclusions  - Procedure narrative: Transthoracic echocardiography. Image quality was suboptimal. The study was technically difficult, as a result  of poor sound wave transmission and body habitus. - Left ventricle: The cavity size was normal. Wall thickness was increased in a pattern of moderate to severe LVH. Systolic function was mildly to moderately reduced. The estimated ejection fraction was in the range of 40% to 45%. Mild to moderate global hypokinesis was seen. There was an increased relative contribution of atrial contraction to ventricular filling. Doppler parameters are consistent with abnormal left ventricular relaxation (grade 1 diastolic dysfunction). - Aortic valve: Mildly calcified annulus. Trileaflet. - Left atrium: The atrium was mildly dilated. - Right atrium: The atrium was mildly dilated.  07/2013 Event Monitor NSR, frequent PVCs   04/2017 echo Study Conclusions  - Left ventricle: The cavity size was normal. Wall thickness  was normal. Systolic function was normal. The estimated ejection fraction was in the range of 50% to 55%. - Left atrium: The atrium was mildly dilated. - Pulmonary arteries: PA peak pressure: 33 mm Hg (S). - Impressions: Poor acoustic windows limit study Cannot evaluate regional wall motion.  Impressions:  - Poor acoustic windows limit study Cannot evaluate regional wall motion.  Labs/Other Tests and Data Reviewed:    EKG:  No ECG reviewed.  Recent Labs: 03/31/2018: B Natriuretic Peptide 510.0 04/03/2018: ALT 26 06/21/2018: BUN 27; Creatinine, Ser 1.47; Hemoglobin 10.2; Magnesium 2.1; Platelets 127; Potassium 4.8; Sodium 139   Recent Lipid Panel Lab Results  Component Value Date/Time   CHOL 137 10/13/2015 10:36 AM   TRIG 83 10/13/2015 10:36 AM   HDL 65 10/13/2015 10:36 AM   CHOLHDL 2.1 10/13/2015 10:36 AM   LDLCALC 55 10/13/2015 10:36 AM    Wt Readings from Last 3 Encounters:  06/21/18 (!) 301 lb (136.5 kg)  04/03/18 (!) 300 lb 0.7 oz (136.1 kg)  01/26/18 (!) 313 lb (142 kg)     Objective:    Vital Signs:   Today's Vitals   08/23/18 0847  BP: 128/64   Pulse: 69  Weight: (!) 306 lb (138.8 kg)  Height: 5' 4" (1.626 m)   Body mass index is 52.52 kg/m.  Normal affect. Normal speech pattern and tone. Comfortable, no apparent distress. No audible signs of SOB or wheezing  ASSESSMENT & PLAN:    1. NICM/Chronic systolic heart failure - LVEF has since normalized - doing well, weights trendgind down, no reported edema - has repeat echo [facility] tomorrow - continue current meds  2. HTN -she is at goal. Continue current meds  3. Afib -no significant symptoms - had bradycardia on high dosing of av nodal agents, has done well on lopressor 12.52m bid - [facility] [place] [facility], request results      COVID-19 Education: The signs and symptoms of COVID-19 were discussed with the patient and how to seek care for testing (follow up with PCP or arrange E-visit).  The importance of social distancing was discussed today.  Time:   Today, I have spent 18 minutes with the patient with telehealth technology discussing the above problems.     Medication Adjustments/Labs and Tests Ordered: Current medicines are reviewed at length with the patient today.  Concerns regarding medicines are outlined above.   Tests Ordered: No orders of the defined types were placed in this encounter.   Medication Changes: No orders of the defined types were placed in this encounter.   Disposition:  Follow up 4 months  Signed, BCarlyle Dolly MD  08/23/2018 8:02 AM    [place]

## 2018-08-24 DIAGNOSIS — R6889 Other general symptoms and signs: Secondary | ICD-10-CM | POA: Diagnosis not present

## 2018-08-24 DIAGNOSIS — Z79899 Other long term (current) drug therapy: Secondary | ICD-10-CM | POA: Diagnosis not present

## 2018-08-24 DIAGNOSIS — C9002 Multiple myeloma in relapse: Secondary | ICD-10-CM | POA: Diagnosis not present

## 2018-08-24 DIAGNOSIS — Z5111 Encounter for antineoplastic chemotherapy: Secondary | ICD-10-CM | POA: Diagnosis not present

## 2018-08-24 DIAGNOSIS — I517 Cardiomegaly: Secondary | ICD-10-CM | POA: Diagnosis not present

## 2018-08-24 DIAGNOSIS — Z9484 Stem cells transplant status: Secondary | ICD-10-CM | POA: Diagnosis not present

## 2018-08-24 DIAGNOSIS — C9000 Multiple myeloma not having achieved remission: Secondary | ICD-10-CM | POA: Diagnosis not present

## 2018-08-24 DIAGNOSIS — I340 Nonrheumatic mitral (valve) insufficiency: Secondary | ICD-10-CM | POA: Diagnosis not present

## 2018-08-24 DIAGNOSIS — Z23 Encounter for immunization: Secondary | ICD-10-CM | POA: Diagnosis not present

## 2018-08-24 DIAGNOSIS — D472 Monoclonal gammopathy: Secondary | ICD-10-CM | POA: Diagnosis not present

## 2018-08-24 DIAGNOSIS — I361 Nonrheumatic tricuspid (valve) insufficiency: Secondary | ICD-10-CM | POA: Diagnosis not present

## 2018-09-13 DIAGNOSIS — R609 Edema, unspecified: Secondary | ICD-10-CM | POA: Diagnosis not present

## 2018-09-13 DIAGNOSIS — E119 Type 2 diabetes mellitus without complications: Secondary | ICD-10-CM | POA: Diagnosis not present

## 2018-09-13 DIAGNOSIS — E890 Postprocedural hypothyroidism: Secondary | ICD-10-CM | POA: Diagnosis not present

## 2018-09-13 DIAGNOSIS — E11319 Type 2 diabetes mellitus with unspecified diabetic retinopathy without macular edema: Secondary | ICD-10-CM | POA: Diagnosis not present

## 2018-09-13 DIAGNOSIS — E113299 Type 2 diabetes mellitus with mild nonproliferative diabetic retinopathy without macular edema, unspecified eye: Secondary | ICD-10-CM | POA: Diagnosis not present

## 2018-09-15 ENCOUNTER — Ambulatory Visit: Payer: Medicare Other | Admitting: Podiatry

## 2018-10-05 DIAGNOSIS — I509 Heart failure, unspecified: Secondary | ICD-10-CM | POA: Diagnosis not present

## 2018-10-11 ENCOUNTER — Other Ambulatory Visit: Payer: Self-pay

## 2018-10-11 ENCOUNTER — Encounter: Payer: Self-pay | Admitting: Podiatry

## 2018-10-11 ENCOUNTER — Ambulatory Visit (INDEPENDENT_AMBULATORY_CARE_PROVIDER_SITE_OTHER): Payer: Medicare Other | Admitting: Podiatry

## 2018-10-11 VITALS — Temp 97.8°F

## 2018-10-11 DIAGNOSIS — M79674 Pain in right toe(s): Secondary | ICD-10-CM

## 2018-10-11 DIAGNOSIS — Q6650 Congenital pes planus, unspecified foot: Secondary | ICD-10-CM

## 2018-10-11 DIAGNOSIS — B351 Tinea unguium: Secondary | ICD-10-CM

## 2018-10-11 DIAGNOSIS — M79675 Pain in left toe(s): Secondary | ICD-10-CM

## 2018-10-11 DIAGNOSIS — E1142 Type 2 diabetes mellitus with diabetic polyneuropathy: Secondary | ICD-10-CM | POA: Diagnosis not present

## 2018-10-11 NOTE — Progress Notes (Signed)
This patient presents the office today for preventative foot care services. She states her nails have grown thick and long and are causing pain and discomfort walking and wearing her shoes. She also admits the callus is no longer painful. She admits to being a diabetic and having neuropathy in her feet. She does admit that part of the neuropathy is due to the side effects of her multiple myeloma medication.. She presents the office today for an evaluation and treatment of her feet.    Physical examination.  DP pulses palpable  B/L.  PT pulses non palpable due to swelling  Capillary return within normal limits. Temperature gradient within normal limits   Neurologic  Diminished semmes -weinstein test.  Musculoskeletal.  Hammertoes are present. Muscle power is within normal limits. No pain crepitus or limitation of motion in the feet noted.  Pes planus foot type. Pain 1st MPJ right greater than left.  Palpable pain at the insertion plantar fascia left foot.  NAILS  thick disfigured discolored nails with subungual debris noted  SKIN  No evidence of callus or infection or ulcer.    Onychomycosis   DJD 1st MPJ  Right  Debridement of nails.     [name] RTC 3 months for preventive footcare services.     Gardiner Barefoot DPM

## 2018-10-17 DIAGNOSIS — C9000 Multiple myeloma not having achieved remission: Secondary | ICD-10-CM | POA: Diagnosis not present

## 2018-10-31 DIAGNOSIS — C9000 Multiple myeloma not having achieved remission: Secondary | ICD-10-CM | POA: Diagnosis not present

## 2018-10-31 DIAGNOSIS — I10 Essential (primary) hypertension: Secondary | ICD-10-CM | POA: Diagnosis not present

## 2018-10-31 DIAGNOSIS — E119 Type 2 diabetes mellitus without complications: Secondary | ICD-10-CM | POA: Diagnosis not present

## 2018-10-31 DIAGNOSIS — I5022 Chronic systolic (congestive) heart failure: Secondary | ICD-10-CM | POA: Diagnosis not present

## 2018-11-10 DIAGNOSIS — C9000 Multiple myeloma not having achieved remission: Secondary | ICD-10-CM | POA: Diagnosis not present

## 2018-11-13 ENCOUNTER — Other Ambulatory Visit (HOSPITAL_COMMUNITY): Payer: Self-pay | Admitting: Oncology

## 2018-11-13 DIAGNOSIS — Z1231 Encounter for screening mammogram for malignant neoplasm of breast: Secondary | ICD-10-CM

## 2018-11-29 ENCOUNTER — Other Ambulatory Visit: Payer: Self-pay

## 2018-11-29 ENCOUNTER — Ambulatory Visit (HOSPITAL_COMMUNITY)
Admission: RE | Admit: 2018-11-29 | Discharge: 2018-11-29 | Disposition: A | Discharge status: Home / Self Care | Payer: Medicare Other | Source: Ambulatory Visit | Attending: Oncology | Admitting: Oncology

## 2018-11-29 DIAGNOSIS — Z1231 Encounter for screening mammogram for malignant neoplasm of breast: Secondary | ICD-10-CM

## 2018-12-12 ENCOUNTER — Other Ambulatory Visit: Payer: Self-pay

## 2018-12-12 DIAGNOSIS — Z20822 Contact with and (suspected) exposure to covid-19: Secondary | ICD-10-CM

## 2018-12-12 DIAGNOSIS — R6889 Other general symptoms and signs: Secondary | ICD-10-CM | POA: Diagnosis not present

## 2018-12-13 LAB — NOVEL CORONAVIRUS, NAA: SARS-CoV-2, NAA: NOT DETECTED

## 2018-12-15 ENCOUNTER — Ambulatory Visit: Payer: Medicare Other | Admitting: Cardiology

## 2018-12-28 DIAGNOSIS — R6889 Other general symptoms and signs: Secondary | ICD-10-CM | POA: Diagnosis not present

## 2018-12-28 DIAGNOSIS — Z9484 Stem cells transplant status: Secondary | ICD-10-CM | POA: Diagnosis not present

## 2018-12-28 DIAGNOSIS — Z23 Encounter for immunization: Secondary | ICD-10-CM | POA: Diagnosis not present

## 2018-12-28 DIAGNOSIS — I361 Nonrheumatic tricuspid (valve) insufficiency: Secondary | ICD-10-CM | POA: Diagnosis not present

## 2018-12-28 DIAGNOSIS — Z5111 Encounter for antineoplastic chemotherapy: Secondary | ICD-10-CM | POA: Diagnosis not present

## 2018-12-28 DIAGNOSIS — Z01812 Encounter for preprocedural laboratory examination: Secondary | ICD-10-CM | POA: Diagnosis not present

## 2018-12-28 DIAGNOSIS — Z0184 Encounter for antibody response examination: Secondary | ICD-10-CM | POA: Diagnosis not present

## 2018-12-28 DIAGNOSIS — I340 Nonrheumatic mitral (valve) insufficiency: Secondary | ICD-10-CM | POA: Diagnosis not present

## 2018-12-28 DIAGNOSIS — Z79899 Other long term (current) drug therapy: Secondary | ICD-10-CM | POA: Diagnosis not present

## 2018-12-28 DIAGNOSIS — I517 Cardiomegaly: Secondary | ICD-10-CM | POA: Diagnosis not present

## 2018-12-28 DIAGNOSIS — D472 Monoclonal gammopathy: Secondary | ICD-10-CM | POA: Diagnosis not present

## 2018-12-28 DIAGNOSIS — C9000 Multiple myeloma not having achieved remission: Secondary | ICD-10-CM | POA: Diagnosis not present

## 2018-12-30 DIAGNOSIS — I5022 Chronic systolic (congestive) heart failure: Secondary | ICD-10-CM | POA: Diagnosis not present

## 2018-12-30 DIAGNOSIS — I10 Essential (primary) hypertension: Secondary | ICD-10-CM | POA: Diagnosis not present

## 2018-12-30 DIAGNOSIS — E1122 Type 2 diabetes mellitus with diabetic chronic kidney disease: Secondary | ICD-10-CM | POA: Diagnosis not present

## 2019-01-12 ENCOUNTER — Ambulatory Visit: Payer: Medicare Other | Admitting: Podiatry

## 2019-01-16 DIAGNOSIS — D701 Agranulocytosis secondary to cancer chemotherapy: Secondary | ICD-10-CM | POA: Diagnosis not present

## 2019-01-16 DIAGNOSIS — Z5111 Encounter for antineoplastic chemotherapy: Secondary | ICD-10-CM | POA: Diagnosis not present

## 2019-01-16 DIAGNOSIS — D696 Thrombocytopenia, unspecified: Secondary | ICD-10-CM | POA: Diagnosis not present

## 2019-01-16 DIAGNOSIS — C9000 Multiple myeloma not having achieved remission: Secondary | ICD-10-CM | POA: Diagnosis not present

## 2019-01-16 DIAGNOSIS — T451X5A Adverse effect of antineoplastic and immunosuppressive drugs, initial encounter: Secondary | ICD-10-CM | POA: Diagnosis not present

## 2019-01-18 ENCOUNTER — Telehealth (INDEPENDENT_AMBULATORY_CARE_PROVIDER_SITE_OTHER): Payer: Medicare Other | Admitting: Cardiology

## 2019-01-18 ENCOUNTER — Encounter: Payer: Self-pay | Admitting: Cardiology

## 2019-01-18 ENCOUNTER — Other Ambulatory Visit: Payer: Self-pay

## 2019-01-18 VITALS — BP 139/59 | HR 53 | Ht 64.0 in | Wt 315.0 lb

## 2019-01-18 DIAGNOSIS — I4891 Unspecified atrial fibrillation: Secondary | ICD-10-CM

## 2019-01-18 DIAGNOSIS — I428 Other cardiomyopathies: Secondary | ICD-10-CM

## 2019-01-18 NOTE — Patient Outreach (Signed)
[facility]) Care Management  01/18/2019  NERINE PULSE 07-26-1945 [id number]   Medication Adherence call to Mrs. Keli [employer] Verify spoke with patient she is past due on Atorvastatin 40 mg,patient explain she is only taking 1 tablet 3 times a week instead of 1 tablet daily,patient has plenty at this time and will order when due. Mrs. Heick is showing past due under [place].   [facility] Direct Dial [phone]  Fax [phone] [email or web address]

## 2019-01-18 NOTE — Patient Instructions (Addendum)
Medication Instructions:  Your physician recommends that you continue on your current medications as directed. Please refer to the Current Medication list given to you today.   Labwork: none  Testing/Procedures: none  Follow-Up: Your physician recommends that you schedule a follow-up appointment in: 4 MONTHS    Any Other Special Instructions Will Be Listed Below (If Applicable).  I have requested records from [facility]. Once Dr. Harl Bowie reviews them we will touch base with you.    If you need a refill on your cardiac medications before your next appointment, please call your pharmacy.

## 2019-01-18 NOTE — Progress Notes (Signed)
Virtual Visit via Video  Note   This visit type was conducted due to national recommendations for restrictions regarding the COVID-19 Pandemic (e.g. social distancing) in an effort to limit this patient's exposure and mitigate transmission in our community.  Due to her co-morbid illnesses, this patient is at least at moderate risk for complications without adequate follow up.  This format is felt to be most appropriate for this patient at this time.  The patient did not have access to video technology/had technical difficulties with video requiring transitioning to audio format only (telephone).  All issues noted in this document were discussed and addressed.  No physical exam could be performed with this format.  Please refer to the patient's chart for her  consent to telehealth for [facility].   Date:  01/18/2019   ID:  [name], DOB 10-Aug-1945, MRN [id number]  Patient Location: Home Provider Location: Office  PCP:  Iona Beard, MD  Cardiologist:  Carlyle Dolly, MD  Electrophysiologist:  None   Evaluation Performed:  Follow-Up Visit  Chief Complaint:  Follow up  History of Present Illness:    [name] is a 73 y.o. female seen today for follow up of the following medical problems.   1. NICM  - echo 07/2013 LVEF 40-45%, mod to severe LVH.  - previously had ICD, had troubles with lead dislodgment and inappropriate shocks in the past and device was removed. Her LVEF has improved and she has not required consideration for another device.  04/2017 echo LVEF 09-40%, diastolic function not reported. - weight 306 during 08/2018 appt, today 315 lbs  - some swellng at times. Takes lasix mainly at night - she reports reent echo at [facility], the cardiologist there changed her to coreg and started entreto, I would assume she had some recurrent LV dysfunction on her echo. We are requesting records  2. HTN  - compliant with meds   3. Multiple  myeloma - followed by Dr Tressie Stalker in [place] and Dr Melba Coon at [facility]  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  - currently in remission    4. CKD - followed by renal Dr Hinda Lenis   5. OSA -followed by DrHawkins -started CPAP machine.    6. Afib - new diagnosisduring admission in [place] for bone marrow transplant, awaiting records. Could tell she was out of rhythm. - started on eliquis.   - denies any recent palpitations.    7. Bradycardia - seen in ER 06/2018 for bradycardia - noted HR's varying from 40s to 90s by home monitor - EKG showed wandering atrial pacemaker  - during Jan 2020 admission noted to have some bradycardia, lopressor lowered to 12.'5mg'$  bid. Had some afib with RVR that admit, transiently on amio   - had heart monitor from Dr Candiss Norse at [facility] for 7 days, told monitor looks good. - denies any recent symptoms.     The patient does not have symptoms concerning for COVID-19 infection (fever, chills, cough, or new shortness of breath).    Past Medical History:  Diagnosis Date  . Anemia    takes Ferrous Fumarate 2 times week  . Arthritis    ankles  . Blood transfusion    hx of-12yr ago  . Cardiomyopathy secondary   . Cataract    bilateral and immature  . CHF (congestive heart failure) ([place]    takes Lasix prn, Echo 2015 with EF 40%  . Chronic back pain   . Diabetes mellitus  takes Glipizide and Metformin daily  . Dysrhythmia    pt takes Digoxin and Carvedilol daily  . GERD (gastroesophageal reflux disease)    takes Protonix seldom  . Gout    hx of x 1 time in Aug 2012  . H/O hiatal hernia   . Hemorrhoid   . Hypertension    takes Diovan and Spirololactone daily  . Left leg pain   . Multiple myeloma (HCC)    IgA myeloma; chemo at [facility]), Onc MD at [facility]  . Overweight(278.02)   . Peripheral neuropathy   . Pneumonia    hx of;many yrs ago  . Sleep apnea    doesn't use CPAP  . Systolic heart failure     Chronic  . Thyroid nodule    right   Past Surgical History:  Procedure Laterality Date  . ABDOMINAL HYSTERECTOMY  1991  . APPENDECTOMY  1991  . CARDIAC DEFIBRILLATOR PLACEMENT  >23yr ago  . Cardiac defirillator removed     removed <325monthremoved d/t shocking pt 15times;pierced sac of heart 1000cc ;drained  . CHOLECYSTECTOMY OPEN  1992  . COLONOSCOPY    . COLONOSCOPY N/A 02/08/2014   slf: 1. NO SOURCE FOR ANEMIA IDENTIFIED. 2. SINGLE polyp removed 3. Moderate divertculosis in the sigmoid colon and descencing colon. 4. Moderate sized internal hemorrhoids.   . Marland Kitchenefibrillator removed    . ESOPHAGOGASTRODUODENOSCOPY N/A 02/08/2014   SLF: No source for anemia identified 2. Black foreign body in cardia 3. Mild erosive gastriitis (inflammaion0 was found in the gastric antrum; multiple biopsies were performed.   . leg lipoma    . 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 ovarian cyst removed  1991  . stomach stapling surgery  80's  . THYROID LOBECTOMY  07/27/2011   Procedure: THYROID LOBECTOMY;  Surgeon: ErGayland CurryMD,FACS;  [facility]: [facility] Service: General;  Laterality: Right;  . TUBAL LIGATION  1971  . tummy tuck  1991     No outpatient medications have been marked as taking for the 01/18/19 encounter (Appointment) with BrArnoldo LenisMD.     Allergies:   Doxycycline   Social History   Tobacco Use  . Smoking status: Never Smoker  . Smokeless tobacco: Never Used  Substance Use Topics  . Alcohol use: Yes    Alcohol/week: 0.0 standard drinks    Comment: glass wine occasionally  . Drug use: No     Family Hx: The patient's family history includes Heart failure in an other family member; Kidney failure in her mother. There is no history of Anesthesia problems, Hypotension, Malignant hyperthermia, or Pseudochol deficiency.  ROS:   Please see the history of present illness.     All other systems reviewed and are negative.   Prior CV studies:   The following  studies were reviewed today:  08/07/13 Echo Study Conclusions  - Procedure narrative: Transthoracic echocardiography. Image quality was suboptimal. The study was technically difficult, as a result of poor sound wave transmission and body habitus. - Left ventricle: The cavity size was normal. Wall thickness was increased in a pattern of moderate to severe LVH. Systolic function was mildly to moderately reduced. The estimated ejection fraction was in the range of 40% to 45%. Mild to moderate global hypokinesis was seen. There was an increased relative contribution of atrial contraction to ventricular filling. Doppler parameters are consistent with abnormal left ventricular relaxation (grade 1 diastolic dysfunction). - Aortic valve: Mildly calcified annulus. Trileaflet. - Left atrium: The atrium  was mildly dilated. - Right atrium: The atrium was mildly dilated.  07/2013 Event Monitor NSR, frequent PVCs   04/2017 echo Study Conclusions  - Left ventricle: The cavity size was normal. Wall thickness was normal. Systolic function was normal. The estimated ejection fraction was in the range of 50% to 55%. - Left atrium: The atrium was mildly dilated. - Pulmonary arteries: PA peak pressure: 33 mm Hg (S). - Impressions: Poor acoustic windows limit study Cannot evaluate regional wall motion.  Impressions:  - Poor acoustic windows limit study Cannot evaluate regional wall motion.  Labs/Other Tests and Data Reviewed:    EKG:  No ECG reviewed.  Recent Labs: 03/31/2018: B Natriuretic Peptide 510.0 04/03/2018: ALT 26 06/21/2018: BUN 27; Creatinine, Ser 1.47; Hemoglobin 10.2; Magnesium 2.1; Platelets 127; Potassium 4.8; Sodium 139   Recent Lipid Panel Lab Results  Component Value Date/Time   CHOL 137 10/13/2015 10:36 AM   TRIG 83 10/13/2015 10:36 AM   HDL 65 10/13/2015 10:36 AM   CHOLHDL 2.1 10/13/2015 10:36 AM   LDLCALC 55 10/13/2015 10:36 AM    Wt Readings from  Last 3 Encounters:  08/23/18 (!) 306 lb (138.8 kg)  06/21/18 (!) 301 lb (136.5 kg)  04/03/18 (!) 300 lb 0.7 oz (136.1 kg)     Objective:    Vital Signs:   Today's Vitals   01/18/19 0917  BP: (!) 139/59  Pulse: (!) 53  Weight: (!) 315 lb (142.9 kg)  Height: '5\' 4"'$  (1.626 m)   Body mass index is 54.07 kg/m.  Normal affect. Normal speech pattern and tone. Comfortable, no apparent distress. NO audible signs of SOB or wheezing ASSESSMENT & PLAN:    1. NICM/Chronic systolic heart failure -LVEF normalized previously, however based on medication changes [facility] I suspect she has some recurrent dysfunction. We will request records, continue current meds  2. HTN -continue current meds  3. Afib -no symptoms - prior bradycardia on lopressor, she has been on coreg [facility]. She denies any significant lightheadedness or dizziness. Continue current meds    COVID-19 Education: The signs and symptoms of COVID-19 were discussed with the patient and how to seek care for testing (follow up with PCP or arrange E-visit).  The importance of social distancing was discussed today.  Time:   Today, I have spent 15 minutes with the patient with telehealth technology discussing the above problems.     Medication Adjustments/Labs and Tests Ordered: Current medicines are reviewed at length with the patient today.  Concerns regarding medicines are outlined above.   Tests Ordered: No orders of the defined types were placed in this encounter.   Medication Changes: No orders of the defined types were placed in this encounter.   Follow Up:  In Person 52month  Signed, BCarlyle Dolly MD  01/18/2019 8:22 AM    [facility]

## 2019-03-02 DIAGNOSIS — G4733 Obstructive sleep apnea (adult) (pediatric): Secondary | ICD-10-CM | POA: Diagnosis not present

## 2019-03-07 DIAGNOSIS — E11319 Type 2 diabetes mellitus with unspecified diabetic retinopathy without macular edema: Secondary | ICD-10-CM | POA: Diagnosis not present

## 2019-03-07 DIAGNOSIS — E890 Postprocedural hypothyroidism: Secondary | ICD-10-CM | POA: Diagnosis not present

## 2019-03-09 DIAGNOSIS — C9000 Multiple myeloma not having achieved remission: Secondary | ICD-10-CM | POA: Diagnosis not present

## 2019-03-09 DIAGNOSIS — Z5111 Encounter for antineoplastic chemotherapy: Secondary | ICD-10-CM | POA: Diagnosis not present

## 2019-03-09 DIAGNOSIS — Z23 Encounter for immunization: Secondary | ICD-10-CM | POA: Diagnosis not present

## 2019-03-09 DIAGNOSIS — R6889 Other general symptoms and signs: Secondary | ICD-10-CM | POA: Diagnosis not present

## 2019-03-09 DIAGNOSIS — D472 Monoclonal gammopathy: Secondary | ICD-10-CM | POA: Diagnosis not present

## 2019-03-09 DIAGNOSIS — C9002 Multiple myeloma in relapse: Secondary | ICD-10-CM | POA: Diagnosis not present

## 2019-03-27 DIAGNOSIS — C9000 Multiple myeloma not having achieved remission: Secondary | ICD-10-CM | POA: Diagnosis not present

## 2019-03-27 DIAGNOSIS — Z95828 Presence of other vascular implants and grafts: Secondary | ICD-10-CM | POA: Diagnosis not present

## 2019-03-27 DIAGNOSIS — I509 Heart failure, unspecified: Secondary | ICD-10-CM | POA: Diagnosis not present

## 2019-03-27 DIAGNOSIS — Z9221 Personal history of antineoplastic chemotherapy: Secondary | ICD-10-CM | POA: Diagnosis not present

## 2019-03-27 DIAGNOSIS — Z9481 Bone marrow transplant status: Secondary | ICD-10-CM | POA: Diagnosis not present

## 2019-03-27 DIAGNOSIS — Z79899 Other long term (current) drug therapy: Secondary | ICD-10-CM | POA: Diagnosis not present

## 2019-03-27 DIAGNOSIS — Z452 Encounter for adjustment and management of vascular access device: Secondary | ICD-10-CM | POA: Diagnosis not present

## 2019-04-02 DIAGNOSIS — N183 Chronic kidney disease, stage 3 unspecified: Secondary | ICD-10-CM | POA: Diagnosis not present

## 2019-04-02 DIAGNOSIS — I5022 Chronic systolic (congestive) heart failure: Secondary | ICD-10-CM | POA: Diagnosis not present

## 2019-04-02 DIAGNOSIS — E1122 Type 2 diabetes mellitus with diabetic chronic kidney disease: Secondary | ICD-10-CM | POA: Diagnosis not present

## 2019-04-02 DIAGNOSIS — I10 Essential (primary) hypertension: Secondary | ICD-10-CM | POA: Diagnosis not present

## 2019-04-12 ENCOUNTER — Other Ambulatory Visit: Payer: Self-pay

## 2019-04-12 ENCOUNTER — Ambulatory Visit: Payer: Medicare Other | Attending: Internal Medicine

## 2019-04-12 DIAGNOSIS — Z20822 Contact with and (suspected) exposure to covid-19: Secondary | ICD-10-CM | POA: Diagnosis not present

## 2019-04-13 LAB — NOVEL CORONAVIRUS, NAA: SARS-CoV-2, NAA: NOT DETECTED

## 2019-04-22 DIAGNOSIS — I130 Hypertensive heart and chronic kidney disease with heart failure and stage 1 through stage 4 chronic kidney disease, or unspecified chronic kidney disease: Secondary | ICD-10-CM | POA: Diagnosis not present

## 2019-04-22 DIAGNOSIS — I5022 Chronic systolic (congestive) heart failure: Secondary | ICD-10-CM | POA: Diagnosis not present

## 2019-04-22 DIAGNOSIS — E1122 Type 2 diabetes mellitus with diabetic chronic kidney disease: Secondary | ICD-10-CM | POA: Diagnosis not present

## 2019-04-22 DIAGNOSIS — N183 Chronic kidney disease, stage 3 unspecified: Secondary | ICD-10-CM | POA: Diagnosis not present

## 2019-04-24 DIAGNOSIS — C9000 Multiple myeloma not having achieved remission: Secondary | ICD-10-CM | POA: Diagnosis not present

## 2019-04-28 ENCOUNTER — Ambulatory Visit: Payer: Medicare Other | Attending: Internal Medicine

## 2019-04-28 DIAGNOSIS — Z23 Encounter for immunization: Secondary | ICD-10-CM

## 2019-04-28 NOTE — Progress Notes (Signed)
   Covid-19 Vaccination Clinic  Name:  [name]    MRN: [id number] DOB: March 25, 1945  04/28/2019  [name] was observed post Covid-19 immunization for 30 minutes based on pre-vaccination screening without incidence. She was provided with Vaccine Information Sheet and instruction to access the V-Safe system.   [name] was instructed to call 911 with any severe reactions post vaccine: [name] Difficulty breathing  . Swelling of your face and throat  . A fast heartbeat  . A bad rash all over your body  . Dizziness and weakness    Immunizations Administered    Name Date Dose VIS Date Route   Moderna COVID-19 Vaccine 04/28/2019 11:40 AM 0.5 mL 02/20/2019 Intramuscular   Manufacturer: Moderna   Lot: [id number]   [place]: [id number]

## 2019-05-11 DIAGNOSIS — N1831 Chronic kidney disease, stage 3a: Secondary | ICD-10-CM | POA: Diagnosis not present

## 2019-05-11 DIAGNOSIS — Z79899 Other long term (current) drug therapy: Secondary | ICD-10-CM | POA: Diagnosis not present

## 2019-05-11 DIAGNOSIS — E559 Vitamin D deficiency, unspecified: Secondary | ICD-10-CM | POA: Diagnosis not present

## 2019-05-11 DIAGNOSIS — R809 Proteinuria, unspecified: Secondary | ICD-10-CM | POA: Diagnosis not present

## 2019-05-11 DIAGNOSIS — D631 Anemia in chronic kidney disease: Secondary | ICD-10-CM | POA: Diagnosis not present

## 2019-05-16 DIAGNOSIS — R809 Proteinuria, unspecified: Secondary | ICD-10-CM | POA: Diagnosis not present

## 2019-05-16 DIAGNOSIS — D649 Anemia, unspecified: Secondary | ICD-10-CM | POA: Diagnosis not present

## 2019-05-16 DIAGNOSIS — N189 Chronic kidney disease, unspecified: Secondary | ICD-10-CM | POA: Diagnosis not present

## 2019-05-16 DIAGNOSIS — C9000 Multiple myeloma not having achieved remission: Secondary | ICD-10-CM | POA: Diagnosis not present

## 2019-05-16 DIAGNOSIS — I129 Hypertensive chronic kidney disease with stage 1 through stage 4 chronic kidney disease, or unspecified chronic kidney disease: Secondary | ICD-10-CM | POA: Diagnosis not present

## 2019-05-20 DIAGNOSIS — I130 Hypertensive heart and chronic kidney disease with heart failure and stage 1 through stage 4 chronic kidney disease, or unspecified chronic kidney disease: Secondary | ICD-10-CM | POA: Diagnosis not present

## 2019-05-20 DIAGNOSIS — N183 Chronic kidney disease, stage 3 unspecified: Secondary | ICD-10-CM | POA: Diagnosis not present

## 2019-05-20 DIAGNOSIS — I5022 Chronic systolic (congestive) heart failure: Secondary | ICD-10-CM | POA: Diagnosis not present

## 2019-05-20 DIAGNOSIS — E1122 Type 2 diabetes mellitus with diabetic chronic kidney disease: Secondary | ICD-10-CM | POA: Diagnosis not present

## 2019-05-28 DIAGNOSIS — D696 Thrombocytopenia, unspecified: Secondary | ICD-10-CM | POA: Diagnosis not present

## 2019-05-28 DIAGNOSIS — Z5111 Encounter for antineoplastic chemotherapy: Secondary | ICD-10-CM | POA: Diagnosis not present

## 2019-05-28 DIAGNOSIS — Z9221 Personal history of antineoplastic chemotherapy: Secondary | ICD-10-CM | POA: Diagnosis not present

## 2019-05-28 DIAGNOSIS — Z95828 Presence of other vascular implants and grafts: Secondary | ICD-10-CM | POA: Diagnosis not present

## 2019-05-28 DIAGNOSIS — C9000 Multiple myeloma not having achieved remission: Secondary | ICD-10-CM | POA: Diagnosis not present

## 2019-05-28 DIAGNOSIS — D701 Agranulocytosis secondary to cancer chemotherapy: Secondary | ICD-10-CM | POA: Diagnosis not present

## 2019-05-28 DIAGNOSIS — I509 Heart failure, unspecified: Secondary | ICD-10-CM | POA: Diagnosis not present

## 2019-05-28 DIAGNOSIS — T451X5A Adverse effect of antineoplastic and immunosuppressive drugs, initial encounter: Secondary | ICD-10-CM | POA: Diagnosis not present

## 2019-05-29 ENCOUNTER — Ambulatory Visit: Payer: Medicare Other | Attending: Internal Medicine

## 2019-05-29 DIAGNOSIS — Z23 Encounter for immunization: Secondary | ICD-10-CM | POA: Insufficient documentation

## 2019-05-29 NOTE — Progress Notes (Signed)
   Covid-19 Vaccination Clinic  Name:  TARI LECOUNT    MRN: [id number] DOB: 07-22-1945  05/29/2019  Ms. Pendergraph was observed post Covid-19 immunization for 15 minutes without incident. She was provided with Vaccine Information Sheet and instruction to access the V-Safe system.   Ms. Loud was instructed to call 911 with any severe reactions post vaccine: [name] Difficulty breathing  . Swelling of face and throat  . A fast heartbeat  . A bad rash all over body  . Dizziness and weakness   Immunizations Administered    Name Date Dose VIS Date Route   Moderna COVID-19 Vaccine 05/29/2019 11:23 AM 0.5 mL 02/20/2019 Intramuscular   Manufacturer: Moderna   Lot: [id number]   [place]: [id number]

## 2019-06-08 DIAGNOSIS — Z794 Long term (current) use of insulin: Secondary | ICD-10-CM | POA: Diagnosis not present

## 2019-06-08 DIAGNOSIS — E890 Postprocedural hypothyroidism: Secondary | ICD-10-CM | POA: Diagnosis not present

## 2019-06-08 DIAGNOSIS — E11319 Type 2 diabetes mellitus with unspecified diabetic retinopathy without macular edema: Secondary | ICD-10-CM | POA: Diagnosis not present

## 2019-06-18 DIAGNOSIS — I5022 Chronic systolic (congestive) heart failure: Secondary | ICD-10-CM | POA: Diagnosis not present

## 2019-06-18 DIAGNOSIS — E119 Type 2 diabetes mellitus without complications: Secondary | ICD-10-CM | POA: Diagnosis not present

## 2019-06-18 DIAGNOSIS — C9000 Multiple myeloma not having achieved remission: Secondary | ICD-10-CM | POA: Diagnosis not present

## 2019-06-18 DIAGNOSIS — G4733 Obstructive sleep apnea (adult) (pediatric): Secondary | ICD-10-CM | POA: Diagnosis not present

## 2019-06-20 DIAGNOSIS — I5032 Chronic diastolic (congestive) heart failure: Secondary | ICD-10-CM | POA: Diagnosis not present

## 2019-06-20 DIAGNOSIS — I130 Hypertensive heart and chronic kidney disease with heart failure and stage 1 through stage 4 chronic kidney disease, or unspecified chronic kidney disease: Secondary | ICD-10-CM | POA: Diagnosis not present

## 2019-06-20 DIAGNOSIS — E1122 Type 2 diabetes mellitus with diabetic chronic kidney disease: Secondary | ICD-10-CM | POA: Diagnosis not present

## 2019-06-20 DIAGNOSIS — N184 Chronic kidney disease, stage 4 (severe): Secondary | ICD-10-CM | POA: Diagnosis not present

## 2019-06-30 DIAGNOSIS — N183 Chronic kidney disease, stage 3 unspecified: Secondary | ICD-10-CM | POA: Diagnosis not present

## 2019-06-30 DIAGNOSIS — I10 Essential (primary) hypertension: Secondary | ICD-10-CM | POA: Diagnosis not present

## 2019-06-30 DIAGNOSIS — E1122 Type 2 diabetes mellitus with diabetic chronic kidney disease: Secondary | ICD-10-CM | POA: Diagnosis not present

## 2019-06-30 DIAGNOSIS — E039 Hypothyroidism, unspecified: Secondary | ICD-10-CM | POA: Diagnosis not present

## 2019-08-07 DIAGNOSIS — C9000 Multiple myeloma not having achieved remission: Secondary | ICD-10-CM | POA: Diagnosis not present

## 2019-08-09 DIAGNOSIS — Z23 Encounter for immunization: Secondary | ICD-10-CM | POA: Diagnosis not present

## 2019-08-09 DIAGNOSIS — R6889 Other general symptoms and signs: Secondary | ICD-10-CM | POA: Diagnosis not present

## 2019-08-09 DIAGNOSIS — C9000 Multiple myeloma not having achieved remission: Secondary | ICD-10-CM | POA: Diagnosis not present

## 2019-08-09 DIAGNOSIS — Z9481 Bone marrow transplant status: Secondary | ICD-10-CM | POA: Diagnosis not present

## 2019-08-09 DIAGNOSIS — C9002 Multiple myeloma in relapse: Secondary | ICD-10-CM | POA: Diagnosis not present

## 2019-08-30 DIAGNOSIS — E119 Type 2 diabetes mellitus without complications: Secondary | ICD-10-CM | POA: Diagnosis not present

## 2019-08-30 DIAGNOSIS — I5022 Chronic systolic (congestive) heart failure: Secondary | ICD-10-CM | POA: Diagnosis not present

## 2019-08-30 DIAGNOSIS — C9000 Multiple myeloma not having achieved remission: Secondary | ICD-10-CM | POA: Diagnosis not present

## 2019-08-30 DIAGNOSIS — Z5181 Encounter for therapeutic drug level monitoring: Secondary | ICD-10-CM | POA: Diagnosis not present

## 2019-08-31 DIAGNOSIS — I129 Hypertensive chronic kidney disease with stage 1 through stage 4 chronic kidney disease, or unspecified chronic kidney disease: Secondary | ICD-10-CM | POA: Diagnosis not present

## 2019-08-31 DIAGNOSIS — E1129 Type 2 diabetes mellitus with other diabetic kidney complication: Secondary | ICD-10-CM | POA: Diagnosis not present

## 2019-08-31 DIAGNOSIS — N189 Chronic kidney disease, unspecified: Secondary | ICD-10-CM | POA: Diagnosis not present

## 2019-08-31 DIAGNOSIS — E1122 Type 2 diabetes mellitus with diabetic chronic kidney disease: Secondary | ICD-10-CM | POA: Diagnosis not present

## 2019-08-31 DIAGNOSIS — R809 Proteinuria, unspecified: Secondary | ICD-10-CM | POA: Diagnosis not present

## 2019-09-12 DIAGNOSIS — D649 Anemia, unspecified: Secondary | ICD-10-CM | POA: Diagnosis not present

## 2019-09-12 DIAGNOSIS — C9000 Multiple myeloma not having achieved remission: Secondary | ICD-10-CM | POA: Diagnosis not present

## 2019-09-12 DIAGNOSIS — R809 Proteinuria, unspecified: Secondary | ICD-10-CM | POA: Diagnosis not present

## 2019-09-12 DIAGNOSIS — N189 Chronic kidney disease, unspecified: Secondary | ICD-10-CM | POA: Diagnosis not present

## 2019-09-12 DIAGNOSIS — I129 Hypertensive chronic kidney disease with stage 1 through stage 4 chronic kidney disease, or unspecified chronic kidney disease: Secondary | ICD-10-CM | POA: Diagnosis not present

## 2019-09-14 ENCOUNTER — Ambulatory Visit: Payer: Medicare Other | Admitting: Cardiology

## 2019-09-18 DIAGNOSIS — D6481 Anemia due to antineoplastic chemotherapy: Secondary | ICD-10-CM | POA: Diagnosis not present

## 2019-09-18 DIAGNOSIS — Z881 Allergy status to other antibiotic agents status: Secondary | ICD-10-CM | POA: Diagnosis not present

## 2019-09-18 DIAGNOSIS — C9000 Multiple myeloma not having achieved remission: Secondary | ICD-10-CM | POA: Diagnosis not present

## 2019-09-18 DIAGNOSIS — G629 Polyneuropathy, unspecified: Secondary | ICD-10-CM | POA: Diagnosis not present

## 2019-09-18 DIAGNOSIS — G620 Drug-induced polyneuropathy: Secondary | ICD-10-CM | POA: Diagnosis not present

## 2019-09-18 DIAGNOSIS — Z79899 Other long term (current) drug therapy: Secondary | ICD-10-CM | POA: Diagnosis not present

## 2019-09-18 DIAGNOSIS — Z7902 Long term (current) use of antithrombotics/antiplatelets: Secondary | ICD-10-CM | POA: Diagnosis not present

## 2019-09-18 DIAGNOSIS — M79606 Pain in leg, unspecified: Secondary | ICD-10-CM | POA: Diagnosis not present

## 2019-09-18 DIAGNOSIS — R609 Edema, unspecified: Secondary | ICD-10-CM | POA: Diagnosis not present

## 2019-09-18 DIAGNOSIS — E079 Disorder of thyroid, unspecified: Secondary | ICD-10-CM | POA: Diagnosis not present

## 2019-09-18 DIAGNOSIS — N183 Chronic kidney disease, stage 3 unspecified: Secondary | ICD-10-CM | POA: Diagnosis not present

## 2019-09-18 DIAGNOSIS — D701 Agranulocytosis secondary to cancer chemotherapy: Secondary | ICD-10-CM | POA: Diagnosis not present

## 2019-09-18 DIAGNOSIS — Z888 Allergy status to other drugs, medicaments and biological substances status: Secondary | ICD-10-CM | POA: Diagnosis not present

## 2019-09-18 DIAGNOSIS — D696 Thrombocytopenia, unspecified: Secondary | ICD-10-CM | POA: Diagnosis not present

## 2019-09-18 DIAGNOSIS — T451X5A Adverse effect of antineoplastic and immunosuppressive drugs, initial encounter: Secondary | ICD-10-CM | POA: Diagnosis not present

## 2019-09-18 DIAGNOSIS — E1122 Type 2 diabetes mellitus with diabetic chronic kidney disease: Secondary | ICD-10-CM | POA: Diagnosis not present

## 2019-09-18 DIAGNOSIS — D6959 Other secondary thrombocytopenia: Secondary | ICD-10-CM | POA: Diagnosis not present

## 2019-10-19 DIAGNOSIS — Z95828 Presence of other vascular implants and grafts: Secondary | ICD-10-CM | POA: Diagnosis not present

## 2019-10-19 DIAGNOSIS — D6959 Other secondary thrombocytopenia: Secondary | ICD-10-CM | POA: Diagnosis not present

## 2019-10-19 DIAGNOSIS — E1122 Type 2 diabetes mellitus with diabetic chronic kidney disease: Secondary | ICD-10-CM | POA: Diagnosis not present

## 2019-10-19 DIAGNOSIS — I5022 Chronic systolic (congestive) heart failure: Secondary | ICD-10-CM | POA: Diagnosis not present

## 2019-10-19 DIAGNOSIS — D6481 Anemia due to antineoplastic chemotherapy: Secondary | ICD-10-CM | POA: Diagnosis not present

## 2019-10-19 DIAGNOSIS — G620 Drug-induced polyneuropathy: Secondary | ICD-10-CM | POA: Diagnosis not present

## 2019-10-19 DIAGNOSIS — C9000 Multiple myeloma not having achieved remission: Secondary | ICD-10-CM | POA: Diagnosis not present

## 2019-10-19 DIAGNOSIS — Z9484 Stem cells transplant status: Secondary | ICD-10-CM | POA: Diagnosis not present

## 2019-10-19 DIAGNOSIS — D701 Agranulocytosis secondary to cancer chemotherapy: Secondary | ICD-10-CM | POA: Diagnosis not present

## 2019-10-19 DIAGNOSIS — T451X5A Adverse effect of antineoplastic and immunosuppressive drugs, initial encounter: Secondary | ICD-10-CM | POA: Diagnosis not present

## 2019-10-19 DIAGNOSIS — I130 Hypertensive heart and chronic kidney disease with heart failure and stage 1 through stage 4 chronic kidney disease, or unspecified chronic kidney disease: Secondary | ICD-10-CM | POA: Diagnosis not present

## 2019-10-19 DIAGNOSIS — N183 Chronic kidney disease, stage 3 unspecified: Secondary | ICD-10-CM | POA: Diagnosis not present

## 2019-10-20 DIAGNOSIS — D649 Anemia, unspecified: Secondary | ICD-10-CM | POA: Diagnosis not present

## 2019-10-20 DIAGNOSIS — I10 Essential (primary) hypertension: Secondary | ICD-10-CM | POA: Diagnosis not present

## 2019-10-20 DIAGNOSIS — E039 Hypothyroidism, unspecified: Secondary | ICD-10-CM | POA: Diagnosis not present

## 2019-10-20 DIAGNOSIS — N183 Chronic kidney disease, stage 3 unspecified: Secondary | ICD-10-CM | POA: Diagnosis not present

## 2019-10-20 DIAGNOSIS — E1122 Type 2 diabetes mellitus with diabetic chronic kidney disease: Secondary | ICD-10-CM | POA: Diagnosis not present

## 2019-10-25 DIAGNOSIS — I4892 Unspecified atrial flutter: Secondary | ICD-10-CM | POA: Diagnosis not present

## 2019-10-25 DIAGNOSIS — Z5181 Encounter for therapeutic drug level monitoring: Secondary | ICD-10-CM | POA: Diagnosis not present

## 2019-10-25 DIAGNOSIS — Z9484 Stem cells transplant status: Secondary | ICD-10-CM | POA: Diagnosis not present

## 2019-10-25 DIAGNOSIS — I2510 Atherosclerotic heart disease of native coronary artery without angina pectoris: Secondary | ICD-10-CM | POA: Diagnosis not present

## 2019-10-25 DIAGNOSIS — I480 Paroxysmal atrial fibrillation: Secondary | ICD-10-CM | POA: Diagnosis not present

## 2019-10-25 DIAGNOSIS — C9000 Multiple myeloma not having achieved remission: Secondary | ICD-10-CM | POA: Diagnosis not present

## 2019-10-25 DIAGNOSIS — I428 Other cardiomyopathies: Secondary | ICD-10-CM | POA: Diagnosis not present

## 2019-10-25 DIAGNOSIS — I10 Essential (primary) hypertension: Secondary | ICD-10-CM | POA: Diagnosis not present

## 2019-10-26 ENCOUNTER — Telehealth: Payer: Self-pay | Admitting: Cardiology

## 2019-10-26 NOTE — Telephone Encounter (Signed)
°  Patient Consent for Virtual Visit         [name] has provided verbal consent on 10/26/2019 for a virtual visit (video or telephone).   CONSENT FOR VIRTUAL VISIT FOR:  [name]  By participating in this virtual visit I agree to the following:  I hereby voluntarily request, consent and authorize [facility] and its employed or contracted physicians, physician assistants, nurse practitioners or other licensed health care professionals (the Practitioner), to provide me with telemedicine health care services (the Services") as deemed necessary by the treating Practitioner. I acknowledge and consent to receive the Services by the Practitioner via telemedicine. I understand that the telemedicine visit will involve communicating with the Practitioner through live audiovisual communication technology and the disclosure of certain medical information by electronic transmission. I acknowledge that I have been given the opportunity to request an in-person assessment or other available alternative prior to the telemedicine visit and am voluntarily participating in the telemedicine visit.  I understand that I have the right to withhold or withdraw my consent to the use of telemedicine in the course of my care at any time, without affecting my right to future care or treatment, and that the Practitioner or I may terminate the telemedicine visit at any time. I understand that I have the right to inspect all information obtained and/or recorded in the course of the telemedicine visit and may receive copies of available information for a reasonable fee.  I understand that some of the potential risks of receiving the Services via telemedicine include:   Delay or interruption in medical evaluation due to technological equipment failure or disruption;  Information transmitted may not be sufficient (e.g. poor resolution of images) to allow for appropriate medical decision making by the  Practitioner; and/or   In rare instances, security protocols could fail, causing a breach of personal health information.  Furthermore, I acknowledge that it is my responsibility to provide information about my medical history, conditions and care that is complete and accurate to the best of my ability. I acknowledge that Practitioner's advice, recommendations, and/or decision may be based on factors not within their control, such as incomplete or inaccurate data provided by me or distortions of diagnostic images or specimens that may result from electronic transmissions. I understand that the practice of medicine is not an exact science and that Practitioner makes no warranties or guarantees regarding treatment outcomes. I acknowledge that a copy of this consent can be made available to me via my patient portal ([place]), or I can request a printed copy by calling the office of [facility].    I understand that my insurance will be billed for this visit.   I have read or had this consent read to me.  I understand the contents of this consent, which adequately explains the benefits and risks of the Services being provided via telemedicine.   I have been provided ample opportunity to ask questions regarding this consent and the Services and have had my questions answered to my satisfaction.  I give my informed consent for the services to be provided through the use of telemedicine in my medical care

## 2019-10-30 DIAGNOSIS — I5020 Unspecified systolic (congestive) heart failure: Secondary | ICD-10-CM | POA: Diagnosis not present

## 2019-11-08 DIAGNOSIS — R6889 Other general symptoms and signs: Secondary | ICD-10-CM | POA: Diagnosis not present

## 2019-11-08 DIAGNOSIS — Z79899 Other long term (current) drug therapy: Secondary | ICD-10-CM | POA: Diagnosis not present

## 2019-11-08 DIAGNOSIS — D472 Monoclonal gammopathy: Secondary | ICD-10-CM | POA: Diagnosis not present

## 2019-11-08 DIAGNOSIS — C9000 Multiple myeloma not having achieved remission: Secondary | ICD-10-CM | POA: Diagnosis not present

## 2019-11-08 DIAGNOSIS — Z9481 Bone marrow transplant status: Secondary | ICD-10-CM | POA: Diagnosis not present

## 2019-11-10 DIAGNOSIS — R079 Chest pain, unspecified: Secondary | ICD-10-CM | POA: Diagnosis not present

## 2019-11-12 ENCOUNTER — Other Ambulatory Visit (HOSPITAL_COMMUNITY): Payer: Self-pay | Admitting: Family Medicine

## 2019-11-12 DIAGNOSIS — G4733 Obstructive sleep apnea (adult) (pediatric): Secondary | ICD-10-CM | POA: Diagnosis not present

## 2019-11-12 DIAGNOSIS — R079 Chest pain, unspecified: Secondary | ICD-10-CM

## 2019-11-12 DIAGNOSIS — C9000 Multiple myeloma not having achieved remission: Secondary | ICD-10-CM | POA: Diagnosis not present

## 2019-11-12 DIAGNOSIS — Z9481 Bone marrow transplant status: Secondary | ICD-10-CM | POA: Diagnosis not present

## 2019-11-12 DIAGNOSIS — I10 Essential (primary) hypertension: Secondary | ICD-10-CM | POA: Diagnosis not present

## 2019-11-12 DIAGNOSIS — I509 Heart failure, unspecified: Secondary | ICD-10-CM | POA: Diagnosis not present

## 2019-11-12 DIAGNOSIS — I480 Paroxysmal atrial fibrillation: Secondary | ICD-10-CM | POA: Diagnosis not present

## 2019-11-14 ENCOUNTER — Ambulatory Visit (HOSPITAL_COMMUNITY)
Admission: RE | Admit: 2019-11-14 | Discharge: 2019-11-14 | Disposition: A | Discharge status: Home / Self Care | Payer: Medicare Other | Source: Ambulatory Visit | Attending: Family Medicine | Admitting: Family Medicine

## 2019-11-14 ENCOUNTER — Other Ambulatory Visit: Payer: Self-pay

## 2019-11-14 DIAGNOSIS — R079 Chest pain, unspecified: Secondary | ICD-10-CM | POA: Diagnosis not present

## 2019-11-15 ENCOUNTER — Other Ambulatory Visit: Payer: Self-pay

## 2019-11-15 ENCOUNTER — Other Ambulatory Visit: Payer: Medicare Other

## 2019-11-15 DIAGNOSIS — Z20822 Contact with and (suspected) exposure to covid-19: Secondary | ICD-10-CM

## 2019-11-16 DIAGNOSIS — Z79899 Other long term (current) drug therapy: Secondary | ICD-10-CM | POA: Diagnosis not present

## 2019-11-16 DIAGNOSIS — Z5181 Encounter for therapeutic drug level monitoring: Secondary | ICD-10-CM | POA: Diagnosis not present

## 2019-11-16 DIAGNOSIS — C9000 Multiple myeloma not having achieved remission: Secondary | ICD-10-CM | POA: Diagnosis not present

## 2019-11-16 DIAGNOSIS — D6959 Other secondary thrombocytopenia: Secondary | ICD-10-CM | POA: Diagnosis not present

## 2019-11-16 DIAGNOSIS — E1122 Type 2 diabetes mellitus with diabetic chronic kidney disease: Secondary | ICD-10-CM | POA: Diagnosis not present

## 2019-11-16 DIAGNOSIS — E079 Disorder of thyroid, unspecified: Secondary | ICD-10-CM | POA: Diagnosis not present

## 2019-11-16 DIAGNOSIS — Z9484 Stem cells transplant status: Secondary | ICD-10-CM | POA: Diagnosis not present

## 2019-11-16 DIAGNOSIS — I129 Hypertensive chronic kidney disease with stage 1 through stage 4 chronic kidney disease, or unspecified chronic kidney disease: Secondary | ICD-10-CM | POA: Diagnosis not present

## 2019-11-16 DIAGNOSIS — Z888 Allergy status to other drugs, medicaments and biological substances status: Secondary | ICD-10-CM | POA: Diagnosis not present

## 2019-11-16 DIAGNOSIS — D701 Agranulocytosis secondary to cancer chemotherapy: Secondary | ICD-10-CM | POA: Diagnosis not present

## 2019-11-16 DIAGNOSIS — T451X5A Adverse effect of antineoplastic and immunosuppressive drugs, initial encounter: Secondary | ICD-10-CM | POA: Diagnosis not present

## 2019-11-16 DIAGNOSIS — Z881 Allergy status to other antibiotic agents status: Secondary | ICD-10-CM | POA: Diagnosis not present

## 2019-11-16 DIAGNOSIS — D6481 Anemia due to antineoplastic chemotherapy: Secondary | ICD-10-CM | POA: Diagnosis not present

## 2019-11-16 DIAGNOSIS — Z95828 Presence of other vascular implants and grafts: Secondary | ICD-10-CM | POA: Diagnosis not present

## 2019-11-16 DIAGNOSIS — Z7902 Long term (current) use of antithrombotics/antiplatelets: Secondary | ICD-10-CM | POA: Diagnosis not present

## 2019-11-16 DIAGNOSIS — N183 Chronic kidney disease, stage 3 unspecified: Secondary | ICD-10-CM | POA: Diagnosis not present

## 2019-11-16 LAB — SARS-COV-2, NAA 2 DAY TAT

## 2019-11-16 LAB — NOVEL CORONAVIRUS, NAA: SARS-CoV-2, NAA: NOT DETECTED

## 2019-11-19 DIAGNOSIS — I5022 Chronic systolic (congestive) heart failure: Secondary | ICD-10-CM | POA: Diagnosis not present

## 2019-11-28 DIAGNOSIS — C9000 Multiple myeloma not having achieved remission: Secondary | ICD-10-CM | POA: Diagnosis not present

## 2019-11-28 DIAGNOSIS — I5022 Chronic systolic (congestive) heart failure: Secondary | ICD-10-CM | POA: Diagnosis not present

## 2019-11-28 DIAGNOSIS — M549 Dorsalgia, unspecified: Secondary | ICD-10-CM | POA: Diagnosis not present

## 2019-11-28 DIAGNOSIS — I480 Paroxysmal atrial fibrillation: Secondary | ICD-10-CM | POA: Diagnosis not present

## 2019-11-28 DIAGNOSIS — G4733 Obstructive sleep apnea (adult) (pediatric): Secondary | ICD-10-CM | POA: Diagnosis not present

## 2019-12-04 ENCOUNTER — Ambulatory Visit
Admission: RE | Admit: 2019-12-04 | Discharge: 2019-12-04 | Disposition: A | Discharge status: Home / Self Care | Payer: Medicare Other | Source: Ambulatory Visit | Attending: Nurse Practitioner | Admitting: Nurse Practitioner

## 2019-12-04 ENCOUNTER — Other Ambulatory Visit: Payer: Self-pay | Admitting: Nurse Practitioner

## 2019-12-04 DIAGNOSIS — J069 Acute upper respiratory infection, unspecified: Secondary | ICD-10-CM

## 2019-12-04 DIAGNOSIS — R05 Cough: Secondary | ICD-10-CM | POA: Diagnosis not present

## 2019-12-04 DIAGNOSIS — Z0000 Encounter for general adult medical examination without abnormal findings: Secondary | ICD-10-CM | POA: Diagnosis not present

## 2019-12-04 DIAGNOSIS — I10 Essential (primary) hypertension: Secondary | ICD-10-CM | POA: Diagnosis not present

## 2019-12-04 DIAGNOSIS — R062 Wheezing: Secondary | ICD-10-CM

## 2019-12-06 DIAGNOSIS — N183 Chronic kidney disease, stage 3 unspecified: Secondary | ICD-10-CM | POA: Diagnosis not present

## 2019-12-06 DIAGNOSIS — I5022 Chronic systolic (congestive) heart failure: Secondary | ICD-10-CM | POA: Diagnosis not present

## 2019-12-06 DIAGNOSIS — R0600 Dyspnea, unspecified: Secondary | ICD-10-CM | POA: Diagnosis not present

## 2019-12-06 DIAGNOSIS — C9000 Multiple myeloma not having achieved remission: Secondary | ICD-10-CM | POA: Diagnosis not present

## 2019-12-11 ENCOUNTER — Ambulatory Visit
Admission: RE | Admit: 2019-12-11 | Discharge: 2019-12-11 | Disposition: A | Discharge status: Home / Self Care | Payer: Medicare Other | Source: Ambulatory Visit | Attending: Nurse Practitioner | Admitting: Nurse Practitioner

## 2019-12-11 ENCOUNTER — Other Ambulatory Visit: Payer: Self-pay

## 2019-12-11 ENCOUNTER — Other Ambulatory Visit: Payer: Self-pay | Admitting: Nurse Practitioner

## 2019-12-11 DIAGNOSIS — M25552 Pain in left hip: Secondary | ICD-10-CM

## 2019-12-11 DIAGNOSIS — M25551 Pain in right hip: Secondary | ICD-10-CM

## 2019-12-11 DIAGNOSIS — M47816 Spondylosis without myelopathy or radiculopathy, lumbar region: Secondary | ICD-10-CM | POA: Diagnosis not present

## 2019-12-11 DIAGNOSIS — J069 Acute upper respiratory infection, unspecified: Secondary | ICD-10-CM | POA: Diagnosis not present

## 2019-12-11 DIAGNOSIS — M25559 Pain in unspecified hip: Secondary | ICD-10-CM | POA: Diagnosis not present

## 2019-12-14 DIAGNOSIS — E890 Postprocedural hypothyroidism: Secondary | ICD-10-CM | POA: Diagnosis not present

## 2019-12-14 DIAGNOSIS — E11319 Type 2 diabetes mellitus with unspecified diabetic retinopathy without macular edema: Secondary | ICD-10-CM | POA: Diagnosis not present

## 2019-12-14 DIAGNOSIS — Z794 Long term (current) use of insulin: Secondary | ICD-10-CM | POA: Diagnosis not present

## 2019-12-18 DIAGNOSIS — M542 Cervicalgia: Secondary | ICD-10-CM | POA: Diagnosis not present

## 2019-12-25 DIAGNOSIS — H905 Unspecified sensorineural hearing loss: Secondary | ICD-10-CM | POA: Diagnosis not present

## 2020-01-02 DIAGNOSIS — E113299 Type 2 diabetes mellitus with mild nonproliferative diabetic retinopathy without macular edema, unspecified eye: Secondary | ICD-10-CM | POA: Diagnosis not present

## 2020-01-02 DIAGNOSIS — Z794 Long term (current) use of insulin: Secondary | ICD-10-CM | POA: Diagnosis not present

## 2020-01-03 DIAGNOSIS — M25551 Pain in right hip: Secondary | ICD-10-CM | POA: Diagnosis not present

## 2020-01-03 DIAGNOSIS — C9000 Multiple myeloma not having achieved remission: Secondary | ICD-10-CM | POA: Diagnosis not present

## 2020-01-03 DIAGNOSIS — M545 Low back pain, unspecified: Secondary | ICD-10-CM | POA: Diagnosis not present

## 2020-01-03 DIAGNOSIS — R6889 Other general symptoms and signs: Secondary | ICD-10-CM | POA: Diagnosis not present

## 2020-01-03 DIAGNOSIS — M549 Dorsalgia, unspecified: Secondary | ICD-10-CM | POA: Diagnosis not present

## 2020-01-03 DIAGNOSIS — D472 Monoclonal gammopathy: Secondary | ICD-10-CM | POA: Diagnosis not present

## 2020-01-03 DIAGNOSIS — D61818 Other pancytopenia: Secondary | ICD-10-CM | POA: Diagnosis not present

## 2020-01-03 DIAGNOSIS — Z9481 Bone marrow transplant status: Secondary | ICD-10-CM | POA: Diagnosis not present

## 2020-01-04 DIAGNOSIS — I5020 Unspecified systolic (congestive) heart failure: Secondary | ICD-10-CM | POA: Diagnosis not present

## 2020-01-04 DIAGNOSIS — K219 Gastro-esophageal reflux disease without esophagitis: Secondary | ICD-10-CM | POA: Diagnosis not present

## 2020-01-04 DIAGNOSIS — I130 Hypertensive heart and chronic kidney disease with heart failure and stage 1 through stage 4 chronic kidney disease, or unspecified chronic kidney disease: Secondary | ICD-10-CM | POA: Diagnosis not present

## 2020-01-04 DIAGNOSIS — S32011A Stable burst fracture of first lumbar vertebra, initial encounter for closed fracture: Secondary | ICD-10-CM | POA: Diagnosis not present

## 2020-01-04 DIAGNOSIS — Z515 Encounter for palliative care: Secondary | ICD-10-CM | POA: Diagnosis not present

## 2020-01-04 DIAGNOSIS — K5900 Constipation, unspecified: Secondary | ICD-10-CM | POA: Diagnosis not present

## 2020-01-04 DIAGNOSIS — D61810 Antineoplastic chemotherapy induced pancytopenia: Secondary | ICD-10-CM | POA: Diagnosis not present

## 2020-01-04 DIAGNOSIS — Z9484 Stem cells transplant status: Secondary | ICD-10-CM | POA: Diagnosis not present

## 2020-01-04 DIAGNOSIS — T451X5A Adverse effect of antineoplastic and immunosuppressive drugs, initial encounter: Secondary | ICD-10-CM | POA: Diagnosis not present

## 2020-01-04 DIAGNOSIS — D3502 Benign neoplasm of left adrenal gland: Secondary | ICD-10-CM | POA: Diagnosis not present

## 2020-01-04 DIAGNOSIS — R0789 Other chest pain: Secondary | ICD-10-CM | POA: Diagnosis not present

## 2020-01-04 DIAGNOSIS — R0781 Pleurodynia: Secondary | ICD-10-CM | POA: Diagnosis not present

## 2020-01-04 DIAGNOSIS — E039 Hypothyroidism, unspecified: Secondary | ICD-10-CM | POA: Diagnosis not present

## 2020-01-04 DIAGNOSIS — E1122 Type 2 diabetes mellitus with diabetic chronic kidney disease: Secondary | ICD-10-CM | POA: Diagnosis not present

## 2020-01-04 DIAGNOSIS — T402X5A Adverse effect of other opioids, initial encounter: Secondary | ICD-10-CM | POA: Diagnosis not present

## 2020-01-04 DIAGNOSIS — C9000 Multiple myeloma not having achieved remission: Secondary | ICD-10-CM | POA: Diagnosis not present

## 2020-01-04 DIAGNOSIS — K921 Melena: Secondary | ICD-10-CM | POA: Diagnosis not present

## 2020-01-04 DIAGNOSIS — Z5111 Encounter for antineoplastic chemotherapy: Secondary | ICD-10-CM | POA: Diagnosis not present

## 2020-01-04 DIAGNOSIS — R112 Nausea with vomiting, unspecified: Secondary | ICD-10-CM | POA: Diagnosis not present

## 2020-01-04 DIAGNOSIS — N184 Chronic kidney disease, stage 4 (severe): Secondary | ICD-10-CM | POA: Diagnosis not present

## 2020-01-04 DIAGNOSIS — G4733 Obstructive sleep apnea (adult) (pediatric): Secondary | ICD-10-CM | POA: Diagnosis not present

## 2020-01-04 DIAGNOSIS — I493 Ventricular premature depolarization: Secondary | ICD-10-CM | POA: Diagnosis not present

## 2020-01-04 DIAGNOSIS — C9002 Multiple myeloma in relapse: Secondary | ICD-10-CM | POA: Diagnosis not present

## 2020-01-04 DIAGNOSIS — I5022 Chronic systolic (congestive) heart failure: Secondary | ICD-10-CM | POA: Diagnosis not present

## 2020-01-04 DIAGNOSIS — R079 Chest pain, unspecified: Secondary | ICD-10-CM | POA: Diagnosis not present

## 2020-01-04 DIAGNOSIS — R110 Nausea: Secondary | ICD-10-CM | POA: Diagnosis not present

## 2020-01-04 DIAGNOSIS — N179 Acute kidney failure, unspecified: Secondary | ICD-10-CM | POA: Diagnosis not present

## 2020-01-04 DIAGNOSIS — K5903 Drug induced constipation: Secondary | ICD-10-CM | POA: Diagnosis not present

## 2020-01-04 DIAGNOSIS — I4891 Unspecified atrial fibrillation: Secondary | ICD-10-CM | POA: Diagnosis not present

## 2020-01-04 DIAGNOSIS — R062 Wheezing: Secondary | ICD-10-CM | POA: Diagnosis not present

## 2020-01-04 DIAGNOSIS — K649 Unspecified hemorrhoids: Secondary | ICD-10-CM | POA: Diagnosis not present

## 2020-01-04 DIAGNOSIS — M549 Dorsalgia, unspecified: Secondary | ICD-10-CM | POA: Diagnosis not present

## 2020-01-04 DIAGNOSIS — G893 Neoplasm related pain (acute) (chronic): Secondary | ICD-10-CM | POA: Diagnosis not present

## 2020-01-15 DIAGNOSIS — R6889 Other general symptoms and signs: Secondary | ICD-10-CM | POA: Diagnosis not present

## 2020-01-15 DIAGNOSIS — C9000 Multiple myeloma not having achieved remission: Secondary | ICD-10-CM | POA: Diagnosis not present

## 2020-01-15 DIAGNOSIS — D649 Anemia, unspecified: Secondary | ICD-10-CM | POA: Diagnosis not present

## 2020-01-15 DIAGNOSIS — Z9484 Stem cells transplant status: Secondary | ICD-10-CM | POA: Diagnosis not present

## 2020-01-15 DIAGNOSIS — D696 Thrombocytopenia, unspecified: Secondary | ICD-10-CM | POA: Diagnosis not present

## 2020-01-15 DIAGNOSIS — D472 Monoclonal gammopathy: Secondary | ICD-10-CM | POA: Diagnosis not present

## 2020-01-15 DIAGNOSIS — C9002 Multiple myeloma in relapse: Secondary | ICD-10-CM | POA: Diagnosis not present

## 2020-01-15 DIAGNOSIS — Z79899 Other long term (current) drug therapy: Secondary | ICD-10-CM | POA: Diagnosis not present

## 2020-01-15 DIAGNOSIS — Z5111 Encounter for antineoplastic chemotherapy: Secondary | ICD-10-CM | POA: Diagnosis not present

## 2020-01-21 DIAGNOSIS — Z792 Long term (current) use of antibiotics: Secondary | ICD-10-CM | POA: Diagnosis not present

## 2020-01-21 DIAGNOSIS — K521 Toxic gastroenteritis and colitis: Secondary | ICD-10-CM | POA: Diagnosis not present

## 2020-01-21 DIAGNOSIS — R1110 Vomiting, unspecified: Secondary | ICD-10-CM | POA: Diagnosis not present

## 2020-01-21 DIAGNOSIS — E860 Dehydration: Secondary | ICD-10-CM | POA: Diagnosis not present

## 2020-01-21 DIAGNOSIS — Z79899 Other long term (current) drug therapy: Secondary | ICD-10-CM | POA: Diagnosis not present

## 2020-01-21 DIAGNOSIS — I428 Other cardiomyopathies: Secondary | ICD-10-CM | POA: Diagnosis not present

## 2020-01-21 DIAGNOSIS — Z794 Long term (current) use of insulin: Secondary | ICD-10-CM | POA: Diagnosis not present

## 2020-01-21 DIAGNOSIS — M25561 Pain in right knee: Secondary | ICD-10-CM | POA: Diagnosis not present

## 2020-01-21 DIAGNOSIS — E785 Hyperlipidemia, unspecified: Secondary | ICD-10-CM | POA: Diagnosis not present

## 2020-01-21 DIAGNOSIS — K56609 Unspecified intestinal obstruction, unspecified as to partial versus complete obstruction: Secondary | ICD-10-CM | POA: Diagnosis not present

## 2020-01-21 DIAGNOSIS — Z9484 Stem cells transplant status: Secondary | ICD-10-CM | POA: Diagnosis not present

## 2020-01-21 DIAGNOSIS — J811 Chronic pulmonary edema: Secondary | ICD-10-CM | POA: Diagnosis not present

## 2020-01-21 DIAGNOSIS — R058 Other specified cough: Secondary | ICD-10-CM | POA: Diagnosis not present

## 2020-01-21 DIAGNOSIS — I130 Hypertensive heart and chronic kidney disease with heart failure and stage 1 through stage 4 chronic kidney disease, or unspecified chronic kidney disease: Secondary | ICD-10-CM | POA: Diagnosis not present

## 2020-01-21 DIAGNOSIS — N183 Chronic kidney disease, stage 3 unspecified: Secondary | ICD-10-CM | POA: Diagnosis not present

## 2020-01-21 DIAGNOSIS — K5651 Intestinal adhesions [bands], with partial obstruction: Secondary | ICD-10-CM | POA: Diagnosis not present

## 2020-01-21 DIAGNOSIS — N179 Acute kidney failure, unspecified: Secondary | ICD-10-CM | POA: Diagnosis not present

## 2020-01-21 DIAGNOSIS — K625 Hemorrhage of anus and rectum: Secondary | ICD-10-CM | POA: Diagnosis not present

## 2020-01-21 DIAGNOSIS — I5022 Chronic systolic (congestive) heart failure: Secondary | ICD-10-CM | POA: Diagnosis not present

## 2020-01-21 DIAGNOSIS — E039 Hypothyroidism, unspecified: Secondary | ICD-10-CM | POA: Diagnosis not present

## 2020-01-21 DIAGNOSIS — I1310 Hypertensive heart and chronic kidney disease without heart failure, with stage 1 through stage 4 chronic kidney disease, or unspecified chronic kidney disease: Secondary | ICD-10-CM | POA: Diagnosis not present

## 2020-01-21 DIAGNOSIS — C9002 Multiple myeloma in relapse: Secondary | ICD-10-CM | POA: Diagnosis not present

## 2020-01-21 DIAGNOSIS — E876 Hypokalemia: Secondary | ICD-10-CM | POA: Diagnosis not present

## 2020-01-21 DIAGNOSIS — C9000 Multiple myeloma not having achieved remission: Secondary | ICD-10-CM | POA: Diagnosis not present

## 2020-01-21 DIAGNOSIS — R9431 Abnormal electrocardiogram [ECG] [EKG]: Secondary | ICD-10-CM | POA: Diagnosis not present

## 2020-01-21 DIAGNOSIS — Z4682 Encounter for fitting and adjustment of non-vascular catheter: Secondary | ICD-10-CM | POA: Diagnosis not present

## 2020-01-21 DIAGNOSIS — R059 Cough, unspecified: Secondary | ICD-10-CM | POA: Diagnosis not present

## 2020-01-21 DIAGNOSIS — R933 Abnormal findings on diagnostic imaging of other parts of digestive tract: Secondary | ICD-10-CM | POA: Diagnosis not present

## 2020-01-21 DIAGNOSIS — I493 Ventricular premature depolarization: Secondary | ICD-10-CM | POA: Diagnosis not present

## 2020-01-21 DIAGNOSIS — I5023 Acute on chronic systolic (congestive) heart failure: Secondary | ICD-10-CM | POA: Diagnosis not present

## 2020-01-21 DIAGNOSIS — K219 Gastro-esophageal reflux disease without esophagitis: Secondary | ICD-10-CM | POA: Diagnosis not present

## 2020-01-21 DIAGNOSIS — E440 Moderate protein-calorie malnutrition: Secondary | ICD-10-CM | POA: Diagnosis not present

## 2020-01-21 DIAGNOSIS — I509 Heart failure, unspecified: Secondary | ICD-10-CM | POA: Diagnosis not present

## 2020-01-21 DIAGNOSIS — T451X5A Adverse effect of antineoplastic and immunosuppressive drugs, initial encounter: Secondary | ICD-10-CM | POA: Diagnosis not present

## 2020-01-21 DIAGNOSIS — K2100 Gastro-esophageal reflux disease with esophagitis, without bleeding: Secondary | ICD-10-CM | POA: Diagnosis not present

## 2020-01-21 DIAGNOSIS — I4949 Other premature depolarization: Secondary | ICD-10-CM | POA: Diagnosis not present

## 2020-01-21 DIAGNOSIS — G4733 Obstructive sleep apnea (adult) (pediatric): Secondary | ICD-10-CM | POA: Diagnosis not present

## 2020-01-21 DIAGNOSIS — K5900 Constipation, unspecified: Secondary | ICD-10-CM | POA: Diagnosis not present

## 2020-01-21 DIAGNOSIS — R5381 Other malaise: Secondary | ICD-10-CM | POA: Diagnosis not present

## 2020-01-21 DIAGNOSIS — D61818 Other pancytopenia: Secondary | ICD-10-CM | POA: Diagnosis not present

## 2020-01-21 DIAGNOSIS — K565 Intestinal adhesions [bands], unspecified as to partial versus complete obstruction: Secondary | ICD-10-CM | POA: Diagnosis not present

## 2020-01-21 DIAGNOSIS — D701 Agranulocytosis secondary to cancer chemotherapy: Secondary | ICD-10-CM | POA: Diagnosis not present

## 2020-01-21 DIAGNOSIS — E1122 Type 2 diabetes mellitus with diabetic chronic kidney disease: Secondary | ICD-10-CM | POA: Diagnosis not present

## 2020-01-21 DIAGNOSIS — I480 Paroxysmal atrial fibrillation: Secondary | ICD-10-CM | POA: Diagnosis not present

## 2020-01-22 DIAGNOSIS — D61818 Other pancytopenia: Secondary | ICD-10-CM | POA: Diagnosis not present

## 2020-01-22 DIAGNOSIS — I4949 Other premature depolarization: Secondary | ICD-10-CM | POA: Diagnosis not present

## 2020-01-22 DIAGNOSIS — C9000 Multiple myeloma not having achieved remission: Secondary | ICD-10-CM | POA: Diagnosis not present

## 2020-01-22 DIAGNOSIS — N179 Acute kidney failure, unspecified: Secondary | ICD-10-CM | POA: Diagnosis not present

## 2020-01-22 DIAGNOSIS — K5900 Constipation, unspecified: Secondary | ICD-10-CM | POA: Diagnosis not present

## 2020-01-22 DIAGNOSIS — Z4682 Encounter for fitting and adjustment of non-vascular catheter: Secondary | ICD-10-CM | POA: Diagnosis not present

## 2020-01-22 DIAGNOSIS — I5022 Chronic systolic (congestive) heart failure: Secondary | ICD-10-CM | POA: Diagnosis not present

## 2020-01-22 DIAGNOSIS — R9431 Abnormal electrocardiogram [ECG] [EKG]: Secondary | ICD-10-CM | POA: Diagnosis not present

## 2020-01-22 DIAGNOSIS — I480 Paroxysmal atrial fibrillation: Secondary | ICD-10-CM | POA: Diagnosis not present

## 2020-01-22 DIAGNOSIS — I493 Ventricular premature depolarization: Secondary | ICD-10-CM | POA: Diagnosis not present

## 2020-01-23 DIAGNOSIS — D61818 Other pancytopenia: Secondary | ICD-10-CM | POA: Diagnosis not present

## 2020-01-23 DIAGNOSIS — I5022 Chronic systolic (congestive) heart failure: Secondary | ICD-10-CM | POA: Diagnosis not present

## 2020-01-23 DIAGNOSIS — I480 Paroxysmal atrial fibrillation: Secondary | ICD-10-CM | POA: Diagnosis not present

## 2020-01-23 DIAGNOSIS — C9000 Multiple myeloma not having achieved remission: Secondary | ICD-10-CM | POA: Diagnosis not present

## 2020-01-23 DIAGNOSIS — K565 Intestinal adhesions [bands], unspecified as to partial versus complete obstruction: Secondary | ICD-10-CM | POA: Diagnosis not present

## 2020-01-23 DIAGNOSIS — N179 Acute kidney failure, unspecified: Secondary | ICD-10-CM | POA: Diagnosis not present

## 2020-01-24 DIAGNOSIS — C9000 Multiple myeloma not having achieved remission: Secondary | ICD-10-CM | POA: Diagnosis not present

## 2020-01-24 DIAGNOSIS — K565 Intestinal adhesions [bands], unspecified as to partial versus complete obstruction: Secondary | ICD-10-CM | POA: Diagnosis not present

## 2020-01-24 DIAGNOSIS — D61818 Other pancytopenia: Secondary | ICD-10-CM | POA: Diagnosis not present

## 2020-01-24 DIAGNOSIS — I480 Paroxysmal atrial fibrillation: Secondary | ICD-10-CM | POA: Diagnosis not present

## 2020-01-24 DIAGNOSIS — N179 Acute kidney failure, unspecified: Secondary | ICD-10-CM | POA: Diagnosis not present

## 2020-01-24 DIAGNOSIS — I5022 Chronic systolic (congestive) heart failure: Secondary | ICD-10-CM | POA: Diagnosis not present

## 2020-01-25 DIAGNOSIS — C9000 Multiple myeloma not having achieved remission: Secondary | ICD-10-CM | POA: Diagnosis not present

## 2020-01-25 DIAGNOSIS — I5022 Chronic systolic (congestive) heart failure: Secondary | ICD-10-CM | POA: Diagnosis not present

## 2020-01-25 DIAGNOSIS — K565 Intestinal adhesions [bands], unspecified as to partial versus complete obstruction: Secondary | ICD-10-CM | POA: Diagnosis not present

## 2020-01-25 DIAGNOSIS — I480 Paroxysmal atrial fibrillation: Secondary | ICD-10-CM | POA: Diagnosis not present

## 2020-01-25 DIAGNOSIS — R058 Other specified cough: Secondary | ICD-10-CM | POA: Diagnosis not present

## 2020-01-25 DIAGNOSIS — R933 Abnormal findings on diagnostic imaging of other parts of digestive tract: Secondary | ICD-10-CM | POA: Diagnosis not present

## 2020-01-25 DIAGNOSIS — N179 Acute kidney failure, unspecified: Secondary | ICD-10-CM | POA: Diagnosis not present

## 2020-01-25 DIAGNOSIS — D61818 Other pancytopenia: Secondary | ICD-10-CM | POA: Diagnosis not present

## 2020-01-26 DIAGNOSIS — K565 Intestinal adhesions [bands], unspecified as to partial versus complete obstruction: Secondary | ICD-10-CM | POA: Diagnosis not present

## 2020-01-26 DIAGNOSIS — C9000 Multiple myeloma not having achieved remission: Secondary | ICD-10-CM | POA: Diagnosis not present

## 2020-01-26 DIAGNOSIS — I5022 Chronic systolic (congestive) heart failure: Secondary | ICD-10-CM | POA: Diagnosis not present

## 2020-01-26 DIAGNOSIS — I480 Paroxysmal atrial fibrillation: Secondary | ICD-10-CM | POA: Diagnosis not present

## 2020-01-26 DIAGNOSIS — N179 Acute kidney failure, unspecified: Secondary | ICD-10-CM | POA: Diagnosis not present

## 2020-01-26 DIAGNOSIS — D61818 Other pancytopenia: Secondary | ICD-10-CM | POA: Diagnosis not present

## 2020-01-27 DIAGNOSIS — I5022 Chronic systolic (congestive) heart failure: Secondary | ICD-10-CM | POA: Diagnosis not present

## 2020-01-27 DIAGNOSIS — R059 Cough, unspecified: Secondary | ICD-10-CM | POA: Diagnosis not present

## 2020-01-27 DIAGNOSIS — D61818 Other pancytopenia: Secondary | ICD-10-CM | POA: Diagnosis not present

## 2020-01-27 DIAGNOSIS — I480 Paroxysmal atrial fibrillation: Secondary | ICD-10-CM | POA: Diagnosis not present

## 2020-01-27 DIAGNOSIS — N179 Acute kidney failure, unspecified: Secondary | ICD-10-CM | POA: Diagnosis not present

## 2020-01-27 DIAGNOSIS — C9000 Multiple myeloma not having achieved remission: Secondary | ICD-10-CM | POA: Diagnosis not present

## 2020-01-28 DIAGNOSIS — D61818 Other pancytopenia: Secondary | ICD-10-CM | POA: Diagnosis not present

## 2020-01-28 DIAGNOSIS — I5022 Chronic systolic (congestive) heart failure: Secondary | ICD-10-CM | POA: Diagnosis not present

## 2020-01-28 DIAGNOSIS — N179 Acute kidney failure, unspecified: Secondary | ICD-10-CM | POA: Diagnosis not present

## 2020-01-28 DIAGNOSIS — C9000 Multiple myeloma not having achieved remission: Secondary | ICD-10-CM | POA: Diagnosis not present

## 2020-01-28 DIAGNOSIS — I480 Paroxysmal atrial fibrillation: Secondary | ICD-10-CM | POA: Diagnosis not present

## 2020-01-29 DIAGNOSIS — D61818 Other pancytopenia: Secondary | ICD-10-CM | POA: Diagnosis not present

## 2020-01-29 DIAGNOSIS — C9000 Multiple myeloma not having achieved remission: Secondary | ICD-10-CM | POA: Diagnosis not present

## 2020-01-29 DIAGNOSIS — M25561 Pain in right knee: Secondary | ICD-10-CM | POA: Diagnosis not present

## 2020-01-29 DIAGNOSIS — I480 Paroxysmal atrial fibrillation: Secondary | ICD-10-CM | POA: Diagnosis not present

## 2020-01-29 DIAGNOSIS — I5022 Chronic systolic (congestive) heart failure: Secondary | ICD-10-CM | POA: Diagnosis not present

## 2020-01-29 DIAGNOSIS — N179 Acute kidney failure, unspecified: Secondary | ICD-10-CM | POA: Diagnosis not present

## 2020-01-30 DIAGNOSIS — C9000 Multiple myeloma not having achieved remission: Secondary | ICD-10-CM | POA: Diagnosis not present

## 2020-01-30 DIAGNOSIS — I5022 Chronic systolic (congestive) heart failure: Secondary | ICD-10-CM | POA: Diagnosis not present

## 2020-01-30 DIAGNOSIS — N179 Acute kidney failure, unspecified: Secondary | ICD-10-CM | POA: Diagnosis not present

## 2020-01-30 DIAGNOSIS — D61818 Other pancytopenia: Secondary | ICD-10-CM | POA: Diagnosis not present

## 2020-01-30 DIAGNOSIS — I480 Paroxysmal atrial fibrillation: Secondary | ICD-10-CM | POA: Diagnosis not present

## 2020-01-31 DIAGNOSIS — R1110 Vomiting, unspecified: Secondary | ICD-10-CM | POA: Diagnosis not present

## 2020-02-04 DIAGNOSIS — M549 Dorsalgia, unspecified: Secondary | ICD-10-CM | POA: Diagnosis not present

## 2020-02-04 DIAGNOSIS — Z5111 Encounter for antineoplastic chemotherapy: Secondary | ICD-10-CM | POA: Diagnosis not present

## 2020-02-04 DIAGNOSIS — E1122 Type 2 diabetes mellitus with diabetic chronic kidney disease: Secondary | ICD-10-CM | POA: Diagnosis not present

## 2020-02-04 DIAGNOSIS — E039 Hypothyroidism, unspecified: Secondary | ICD-10-CM | POA: Diagnosis not present

## 2020-02-04 DIAGNOSIS — R110 Nausea: Secondary | ICD-10-CM | POA: Diagnosis not present

## 2020-02-04 DIAGNOSIS — N179 Acute kidney failure, unspecified: Secondary | ICD-10-CM | POA: Diagnosis not present

## 2020-02-04 DIAGNOSIS — D61818 Other pancytopenia: Secondary | ICD-10-CM | POA: Diagnosis not present

## 2020-02-04 DIAGNOSIS — N189 Chronic kidney disease, unspecified: Secondary | ICD-10-CM | POA: Diagnosis not present

## 2020-02-04 DIAGNOSIS — Z9484 Stem cells transplant status: Secondary | ICD-10-CM | POA: Diagnosis not present

## 2020-02-04 DIAGNOSIS — E876 Hypokalemia: Secondary | ICD-10-CM | POA: Diagnosis not present

## 2020-02-04 DIAGNOSIS — G4733 Obstructive sleep apnea (adult) (pediatric): Secondary | ICD-10-CM | POA: Diagnosis not present

## 2020-02-04 DIAGNOSIS — R5381 Other malaise: Secondary | ICD-10-CM | POA: Diagnosis not present

## 2020-02-04 DIAGNOSIS — T451X5A Adverse effect of antineoplastic and immunosuppressive drugs, initial encounter: Secondary | ICD-10-CM | POA: Diagnosis not present

## 2020-02-04 DIAGNOSIS — I130 Hypertensive heart and chronic kidney disease with heart failure and stage 1 through stage 4 chronic kidney disease, or unspecified chronic kidney disease: Secondary | ICD-10-CM | POA: Diagnosis not present

## 2020-02-04 DIAGNOSIS — C9002 Multiple myeloma in relapse: Secondary | ICD-10-CM | POA: Diagnosis not present

## 2020-02-04 DIAGNOSIS — Z0389 Encounter for observation for other suspected diseases and conditions ruled out: Secondary | ICD-10-CM | POA: Diagnosis not present

## 2020-02-04 DIAGNOSIS — D701 Agranulocytosis secondary to cancer chemotherapy: Secondary | ICD-10-CM | POA: Diagnosis not present

## 2020-02-04 DIAGNOSIS — I5022 Chronic systolic (congestive) heart failure: Secondary | ICD-10-CM | POA: Diagnosis not present

## 2020-02-04 DIAGNOSIS — Z794 Long term (current) use of insulin: Secondary | ICD-10-CM | POA: Diagnosis not present

## 2020-02-04 DIAGNOSIS — Z792 Long term (current) use of antibiotics: Secondary | ICD-10-CM | POA: Diagnosis not present

## 2020-02-04 DIAGNOSIS — I509 Heart failure, unspecified: Secondary | ICD-10-CM | POA: Diagnosis not present

## 2020-02-04 DIAGNOSIS — C9000 Multiple myeloma not having achieved remission: Secondary | ICD-10-CM | POA: Diagnosis not present

## 2020-02-04 DIAGNOSIS — N183 Chronic kidney disease, stage 3 unspecified: Secondary | ICD-10-CM | POA: Diagnosis not present

## 2020-02-04 DIAGNOSIS — I1310 Hypertensive heart and chronic kidney disease without heart failure, with stage 1 through stage 4 chronic kidney disease, or unspecified chronic kidney disease: Secondary | ICD-10-CM | POA: Diagnosis not present

## 2020-02-04 DIAGNOSIS — K2100 Gastro-esophageal reflux disease with esophagitis, without bleeding: Secondary | ICD-10-CM | POA: Diagnosis not present

## 2020-02-04 DIAGNOSIS — Z79899 Other long term (current) drug therapy: Secondary | ICD-10-CM | POA: Diagnosis not present

## 2020-02-05 DIAGNOSIS — I509 Heart failure, unspecified: Secondary | ICD-10-CM | POA: Diagnosis not present

## 2020-02-05 DIAGNOSIS — I1310 Hypertensive heart and chronic kidney disease without heart failure, with stage 1 through stage 4 chronic kidney disease, or unspecified chronic kidney disease: Secondary | ICD-10-CM | POA: Diagnosis not present

## 2020-02-05 DIAGNOSIS — R110 Nausea: Secondary | ICD-10-CM | POA: Diagnosis not present

## 2020-02-05 DIAGNOSIS — Z0389 Encounter for observation for other suspected diseases and conditions ruled out: Secondary | ICD-10-CM | POA: Diagnosis not present

## 2020-02-05 DIAGNOSIS — C9000 Multiple myeloma not having achieved remission: Secondary | ICD-10-CM | POA: Diagnosis not present

## 2020-02-05 DIAGNOSIS — R5381 Other malaise: Secondary | ICD-10-CM | POA: Diagnosis not present

## 2020-02-06 DIAGNOSIS — I509 Heart failure, unspecified: Secondary | ICD-10-CM | POA: Diagnosis not present

## 2020-02-06 DIAGNOSIS — I1310 Hypertensive heart and chronic kidney disease without heart failure, with stage 1 through stage 4 chronic kidney disease, or unspecified chronic kidney disease: Secondary | ICD-10-CM | POA: Diagnosis not present

## 2020-02-06 DIAGNOSIS — I130 Hypertensive heart and chronic kidney disease with heart failure and stage 1 through stage 4 chronic kidney disease, or unspecified chronic kidney disease: Secondary | ICD-10-CM | POA: Diagnosis not present

## 2020-02-06 DIAGNOSIS — D61818 Other pancytopenia: Secondary | ICD-10-CM | POA: Diagnosis not present

## 2020-02-06 DIAGNOSIS — C9000 Multiple myeloma not having achieved remission: Secondary | ICD-10-CM | POA: Diagnosis not present

## 2020-02-06 DIAGNOSIS — C9002 Multiple myeloma in relapse: Secondary | ICD-10-CM | POA: Diagnosis not present

## 2020-02-06 DIAGNOSIS — R5381 Other malaise: Secondary | ICD-10-CM | POA: Diagnosis not present

## 2020-02-06 DIAGNOSIS — Z5111 Encounter for antineoplastic chemotherapy: Secondary | ICD-10-CM | POA: Diagnosis not present

## 2020-02-07 DIAGNOSIS — C9000 Multiple myeloma not having achieved remission: Secondary | ICD-10-CM | POA: Diagnosis not present

## 2020-02-07 DIAGNOSIS — I509 Heart failure, unspecified: Secondary | ICD-10-CM | POA: Diagnosis not present

## 2020-02-07 DIAGNOSIS — I1310 Hypertensive heart and chronic kidney disease without heart failure, with stage 1 through stage 4 chronic kidney disease, or unspecified chronic kidney disease: Secondary | ICD-10-CM | POA: Diagnosis not present

## 2020-02-07 DIAGNOSIS — R5381 Other malaise: Secondary | ICD-10-CM | POA: Diagnosis not present

## 2020-02-08 ENCOUNTER — Telehealth: Payer: Medicare Other | Admitting: Cardiology

## 2020-02-08 DIAGNOSIS — I509 Heart failure, unspecified: Secondary | ICD-10-CM | POA: Diagnosis not present

## 2020-02-08 DIAGNOSIS — I1310 Hypertensive heart and chronic kidney disease without heart failure, with stage 1 through stage 4 chronic kidney disease, or unspecified chronic kidney disease: Secondary | ICD-10-CM | POA: Diagnosis not present

## 2020-02-08 DIAGNOSIS — C9000 Multiple myeloma not having achieved remission: Secondary | ICD-10-CM | POA: Diagnosis not present

## 2020-02-08 DIAGNOSIS — R5381 Other malaise: Secondary | ICD-10-CM | POA: Diagnosis not present

## 2020-02-09 DIAGNOSIS — I1310 Hypertensive heart and chronic kidney disease without heart failure, with stage 1 through stage 4 chronic kidney disease, or unspecified chronic kidney disease: Secondary | ICD-10-CM | POA: Diagnosis not present

## 2020-02-09 DIAGNOSIS — R5381 Other malaise: Secondary | ICD-10-CM | POA: Diagnosis not present

## 2020-02-09 DIAGNOSIS — C9000 Multiple myeloma not having achieved remission: Secondary | ICD-10-CM | POA: Diagnosis not present

## 2020-02-09 DIAGNOSIS — I509 Heart failure, unspecified: Secondary | ICD-10-CM | POA: Diagnosis not present

## 2020-02-11 DIAGNOSIS — C9000 Multiple myeloma not having achieved remission: Secondary | ICD-10-CM | POA: Diagnosis not present

## 2020-02-11 DIAGNOSIS — I1310 Hypertensive heart and chronic kidney disease without heart failure, with stage 1 through stage 4 chronic kidney disease, or unspecified chronic kidney disease: Secondary | ICD-10-CM | POA: Diagnosis not present

## 2020-02-11 DIAGNOSIS — R5381 Other malaise: Secondary | ICD-10-CM | POA: Diagnosis not present

## 2020-02-11 DIAGNOSIS — M549 Dorsalgia, unspecified: Secondary | ICD-10-CM | POA: Diagnosis not present

## 2020-02-11 DIAGNOSIS — I509 Heart failure, unspecified: Secondary | ICD-10-CM | POA: Diagnosis not present

## 2020-02-12 DIAGNOSIS — M549 Dorsalgia, unspecified: Secondary | ICD-10-CM | POA: Diagnosis not present

## 2020-02-12 DIAGNOSIS — I509 Heart failure, unspecified: Secondary | ICD-10-CM | POA: Diagnosis not present

## 2020-02-12 DIAGNOSIS — R5381 Other malaise: Secondary | ICD-10-CM | POA: Diagnosis not present

## 2020-02-12 DIAGNOSIS — I1310 Hypertensive heart and chronic kidney disease without heart failure, with stage 1 through stage 4 chronic kidney disease, or unspecified chronic kidney disease: Secondary | ICD-10-CM | POA: Diagnosis not present

## 2020-02-12 DIAGNOSIS — C9000 Multiple myeloma not having achieved remission: Secondary | ICD-10-CM | POA: Diagnosis not present

## 2020-02-13 DIAGNOSIS — I130 Hypertensive heart and chronic kidney disease with heart failure and stage 1 through stage 4 chronic kidney disease, or unspecified chronic kidney disease: Secondary | ICD-10-CM | POA: Diagnosis not present

## 2020-02-13 DIAGNOSIS — N189 Chronic kidney disease, unspecified: Secondary | ICD-10-CM | POA: Diagnosis not present

## 2020-02-13 DIAGNOSIS — I1310 Hypertensive heart and chronic kidney disease without heart failure, with stage 1 through stage 4 chronic kidney disease, or unspecified chronic kidney disease: Secondary | ICD-10-CM | POA: Diagnosis not present

## 2020-02-13 DIAGNOSIS — M549 Dorsalgia, unspecified: Secondary | ICD-10-CM | POA: Diagnosis not present

## 2020-02-13 DIAGNOSIS — C9002 Multiple myeloma in relapse: Secondary | ICD-10-CM | POA: Diagnosis not present

## 2020-02-13 DIAGNOSIS — I5022 Chronic systolic (congestive) heart failure: Secondary | ICD-10-CM | POA: Diagnosis not present

## 2020-02-13 DIAGNOSIS — I509 Heart failure, unspecified: Secondary | ICD-10-CM | POA: Diagnosis not present

## 2020-02-13 DIAGNOSIS — Z5111 Encounter for antineoplastic chemotherapy: Secondary | ICD-10-CM | POA: Diagnosis not present

## 2020-02-13 DIAGNOSIS — R5381 Other malaise: Secondary | ICD-10-CM | POA: Diagnosis not present

## 2020-02-13 DIAGNOSIS — C9000 Multiple myeloma not having achieved remission: Secondary | ICD-10-CM | POA: Diagnosis not present

## 2020-02-13 DIAGNOSIS — D61818 Other pancytopenia: Secondary | ICD-10-CM | POA: Diagnosis not present

## 2020-02-14 DIAGNOSIS — R5381 Other malaise: Secondary | ICD-10-CM | POA: Diagnosis not present

## 2020-02-14 DIAGNOSIS — I1310 Hypertensive heart and chronic kidney disease without heart failure, with stage 1 through stage 4 chronic kidney disease, or unspecified chronic kidney disease: Secondary | ICD-10-CM | POA: Diagnosis not present

## 2020-02-14 DIAGNOSIS — C9000 Multiple myeloma not having achieved remission: Secondary | ICD-10-CM | POA: Diagnosis not present

## 2020-02-14 DIAGNOSIS — I509 Heart failure, unspecified: Secondary | ICD-10-CM | POA: Diagnosis not present

## 2020-02-14 DIAGNOSIS — I5022 Chronic systolic (congestive) heart failure: Secondary | ICD-10-CM | POA: Diagnosis not present

## 2020-02-15 DIAGNOSIS — I5022 Chronic systolic (congestive) heart failure: Secondary | ICD-10-CM | POA: Diagnosis not present

## 2020-02-15 DIAGNOSIS — C9000 Multiple myeloma not having achieved remission: Secondary | ICD-10-CM | POA: Diagnosis not present

## 2020-02-15 DIAGNOSIS — R5381 Other malaise: Secondary | ICD-10-CM | POA: Diagnosis not present

## 2020-02-15 DIAGNOSIS — N189 Chronic kidney disease, unspecified: Secondary | ICD-10-CM | POA: Diagnosis not present

## 2020-02-15 DIAGNOSIS — I509 Heart failure, unspecified: Secondary | ICD-10-CM | POA: Diagnosis not present

## 2020-02-15 DIAGNOSIS — I1310 Hypertensive heart and chronic kidney disease without heart failure, with stage 1 through stage 4 chronic kidney disease, or unspecified chronic kidney disease: Secondary | ICD-10-CM | POA: Diagnosis not present

## 2020-02-16 DIAGNOSIS — M549 Dorsalgia, unspecified: Secondary | ICD-10-CM | POA: Diagnosis not present

## 2020-02-16 DIAGNOSIS — C9000 Multiple myeloma not having achieved remission: Secondary | ICD-10-CM | POA: Diagnosis not present

## 2020-02-16 DIAGNOSIS — I509 Heart failure, unspecified: Secondary | ICD-10-CM | POA: Diagnosis not present

## 2020-02-16 DIAGNOSIS — N189 Chronic kidney disease, unspecified: Secondary | ICD-10-CM | POA: Diagnosis not present

## 2020-02-16 DIAGNOSIS — I1310 Hypertensive heart and chronic kidney disease without heart failure, with stage 1 through stage 4 chronic kidney disease, or unspecified chronic kidney disease: Secondary | ICD-10-CM | POA: Diagnosis not present

## 2020-02-16 DIAGNOSIS — I5022 Chronic systolic (congestive) heart failure: Secondary | ICD-10-CM | POA: Diagnosis not present

## 2020-02-16 DIAGNOSIS — R5381 Other malaise: Secondary | ICD-10-CM | POA: Diagnosis not present

## 2020-02-17 DIAGNOSIS — I5022 Chronic systolic (congestive) heart failure: Secondary | ICD-10-CM | POA: Diagnosis not present

## 2020-02-17 DIAGNOSIS — I1310 Hypertensive heart and chronic kidney disease without heart failure, with stage 1 through stage 4 chronic kidney disease, or unspecified chronic kidney disease: Secondary | ICD-10-CM | POA: Diagnosis not present

## 2020-02-17 DIAGNOSIS — I509 Heart failure, unspecified: Secondary | ICD-10-CM | POA: Diagnosis not present

## 2020-02-17 DIAGNOSIS — N189 Chronic kidney disease, unspecified: Secondary | ICD-10-CM | POA: Diagnosis not present

## 2020-02-17 DIAGNOSIS — C9000 Multiple myeloma not having achieved remission: Secondary | ICD-10-CM | POA: Diagnosis not present

## 2020-02-18 DIAGNOSIS — C9000 Multiple myeloma not having achieved remission: Secondary | ICD-10-CM | POA: Diagnosis not present

## 2020-02-18 DIAGNOSIS — M549 Dorsalgia, unspecified: Secondary | ICD-10-CM | POA: Diagnosis not present

## 2020-02-18 DIAGNOSIS — I5022 Chronic systolic (congestive) heart failure: Secondary | ICD-10-CM | POA: Diagnosis not present

## 2020-02-18 DIAGNOSIS — I509 Heart failure, unspecified: Secondary | ICD-10-CM | POA: Diagnosis not present

## 2020-02-18 DIAGNOSIS — N189 Chronic kidney disease, unspecified: Secondary | ICD-10-CM | POA: Diagnosis not present

## 2020-02-18 DIAGNOSIS — N179 Acute kidney failure, unspecified: Secondary | ICD-10-CM | POA: Diagnosis not present

## 2020-02-18 DIAGNOSIS — I1310 Hypertensive heart and chronic kidney disease without heart failure, with stage 1 through stage 4 chronic kidney disease, or unspecified chronic kidney disease: Secondary | ICD-10-CM | POA: Diagnosis not present

## 2020-02-18 DIAGNOSIS — R5381 Other malaise: Secondary | ICD-10-CM | POA: Diagnosis not present

## 2020-02-19 DIAGNOSIS — M549 Dorsalgia, unspecified: Secondary | ICD-10-CM | POA: Diagnosis not present

## 2020-02-19 DIAGNOSIS — Z5111 Encounter for antineoplastic chemotherapy: Secondary | ICD-10-CM | POA: Diagnosis not present

## 2020-02-19 DIAGNOSIS — D61818 Other pancytopenia: Secondary | ICD-10-CM | POA: Diagnosis not present

## 2020-02-19 DIAGNOSIS — C9002 Multiple myeloma in relapse: Secondary | ICD-10-CM | POA: Diagnosis not present

## 2020-02-19 DIAGNOSIS — I1310 Hypertensive heart and chronic kidney disease without heart failure, with stage 1 through stage 4 chronic kidney disease, or unspecified chronic kidney disease: Secondary | ICD-10-CM | POA: Diagnosis not present

## 2020-02-19 DIAGNOSIS — R5381 Other malaise: Secondary | ICD-10-CM | POA: Diagnosis not present

## 2020-02-19 DIAGNOSIS — I509 Heart failure, unspecified: Secondary | ICD-10-CM | POA: Diagnosis not present

## 2020-02-19 DIAGNOSIS — C9000 Multiple myeloma not having achieved remission: Secondary | ICD-10-CM | POA: Diagnosis not present

## 2020-02-19 DIAGNOSIS — I130 Hypertensive heart and chronic kidney disease with heart failure and stage 1 through stage 4 chronic kidney disease, or unspecified chronic kidney disease: Secondary | ICD-10-CM | POA: Diagnosis not present

## 2020-02-19 NOTE — Progress Notes (Addendum)
Virtual Visit via Telephone Note   This visit type was conducted due to national recommendations for restrictions regarding the COVID-19 Pandemic (e.g. social distancing) in an effort to limit this patient's exposure and mitigate transmission in our community.  Due to her co-morbid illnesses, this patient is at least at moderate risk for complications without adequate follow up.  This format is felt to be most appropriate for this patient at this time.  The patient did not have access to video technology/had technical difficulties with video requiring transitioning to audio format only (telephone).  All issues noted in this document were discussed and addressed.  No physical exam could be performed with this format.  Please refer to the patient's chart for her  consent to telehealth for [facility].    Date:  02/25/2020   ID:  [name], DOB 12-23-1945, MRN [id number] The patient was identified using 2 identifiers.  Patient Location: Home Provider Location: Office/Clinic  PCP:  Iona Beard, MD  Cardiologist:  Carlyle Dolly, MD   Electrophysiologist:  None   Evaluation Performed:  Follow-Up Visit  Chief Complaint:  f/u  History of Present Illness:    [name] is a 74 y.o. female with history nonischemic cardiomyopathy ejection 40 to 45% 2015 previously had an ICD but had troubles with lead dislodgment and inappropriate shocks in the past and device was removed.  LVEF 50 to 55% improved on echo 04/2017 he reports another echo done at [facility] and cardiologist changed her to [place] and started Entresto.  Patient also has hypertension, CKD followed by Dr. Lowanda Foster, OSA on CPAP, multiple myeloma status post bone marrow transplant in [place], atrial fibrillation during admission in [place] for bone marrow transplant started on Eliquis, bradycardia on metoprolol 06/2018 started on carvedilol at [facility]  Last had a telemedicine visit with Dr. Harl Bowie  01/18/2019 and was doing well.  Patient just got home from a rehab stay. Patient says she was hospitalized in [place] with low platelets Afib, kidney problems and CHF. Patient now on Torsemide 20 mg once a day and lasix stopped. Back on metoprolol but holds dose if HR gets too slow. Has DOE. Family is staying with her.  The patient does not have symptoms concerning for COVID-19 infection (fever, chills, cough, or new shortness of breath).    Past Medical History:  Diagnosis Date  . Anemia    takes Ferrous Fumarate 2 times week  . Arthritis    ankles  . Blood transfusion    hx of-58yrs ago  . Cardiomyopathy secondary   . Cataract    bilateral and immature  . CHF (congestive heart failure) ([place])    takes Lasix prn, Echo 2015 with EF 40%  . Chronic back pain   . Diabetes mellitus    takes Glipizide and Metformin daily  . Dysrhythmia    pt takes Digoxin and Carvedilol daily  . GERD (gastroesophageal reflux disease)    takes Protonix seldom  . Gout    hx of x 1 time in Aug 2012  . H/O hiatal hernia   . Hemorrhoid   . Hypertension    takes Diovan and Spirololactone daily  . Left leg pain   . Multiple myeloma (HCC)    IgA myeloma; chemo at [facility] On[facility]  . Overweight(278.02)   . Peripheral neuropathy   . Pneumonia    hx of;many yrs ago  . Sleep apnea    doesn't use CPAP  . Systolic  heart failure    Chronic  . Thyroid nodule    right   Past Surgical History:  Procedure Laterality Date  . ABDOMINAL HYSTERECTOMY  1991  . APPENDECTOMY  1991  . CARDIAC DEFIBRILLATOR PLACEMENT  >59yrs ago  . Cardiac defirillator removed     removed <57months;removed d/t shocking pt 15times;pierced sac of heart 1000cc ;drained  . CHOLECYSTECTOMY OPEN  1992  . COLONOSCOPY    . COLONOSCOPY N/A 02/08/2014   slf: 1. NO SOURCE FOR ANEMIA IDENTIFIED. 2. SINGLE polyp removed 3. Moderate divertculosis in the sigmoid colon and descencing colon. 4. Moderate sized internal hemorrhoids.     [name] defibrillator removed    . ESOPHAGOGASTRODUODENOSCOPY N/A 02/08/2014   SLF: No source for anemia identified 2. Black foreign body in cardia 3. Mild erosive gastriitis (inflammaion0 was found in the gastric antrum; multiple biopsies were performed.   . leg lipoma    . 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 ovarian cyst removed  1991  . stomach stapling surgery  80's  . THYROID LOBECTOMY  07/27/2011   Procedure: THYROID LOBECTOMY;  Surgeon: Gayland Curry, MD,FACS;  Location: [place];  Service: General;  Laterality: Right;  . TUBAL LIGATION  1971  . tummy tuck  1991     Current Meds  Medication Sig  . acyclovir (ZOVIRAX) 400 MG tablet Take 400 mg by mouth daily.  [name] albuterol (PROVENTIL HFA;VENTOLIN HFA) 108 (90 Base) MCG/ACT inhaler Inhale 2 puffs into the lungs every 4 (four) hours as needed for wheezing or shortness of breath (cough, shortness of breath or wheezing.).  [name] apixaban (ELIQUIS) 5 MG TABS tablet Take 5 mg by mouth 2 (two) times daily.   [name] atorvastatin (LIPITOR) 40 MG tablet Take 40 mg by mouth every evening.   . BD PEN NEEDLE MICRO U/F 32G X 6 MM MISC INJECT INSULIN BEFORE EACH MEAL AND AT BEDTIME.  [name] Cholecalciferol (VITAMIN D3) 50 MCG (2000 UT) CHEW Chew by mouth.  . cholestyramine (QUESTRAN) 4 g packet Take 1 packet by mouth 2 (two) times daily.  [name] ENTRESTO 24-26 MG Take 1 tablet by mouth 2 (two) times daily.  [name] gabapentin (NEURONTIN) 100 MG capsule Take 100 mg by mouth daily.  [name] glucose blood test strip   . insulin lispro (HUMALOG KWIKPEN) 100 UNIT/ML KwikPen Inject 10-20 Units into the skin 3 (three) times daily before meals. By sliding scale-patient adjusts as necessary  . KLOR-CON M20 20 MEQ tablet Take 20 mEq by mouth 2 (two) times daily.  . magnesium oxide (MAG-OX) 400 MG tablet Take 1 tablet by mouth daily.  . metoprolol succinate (TOPROL-XL) 25 MG 24 hr tablet Take 25 mg by mouth 2 (two) times daily.  [name] omeprazole (PRILOSEC) 20 MG capsule Take 20 mg by mouth  daily.  . RESTASIS 0.05 % ophthalmic emulsion INSTILL 1 DROP INTO BOTH EYES TWICE A DAY  . SYNTHROID 125 MCG tablet TAKE 1 TABLET BY MOUTH EVERY DAY ON EMPTY STOMACH IN THE MORNING  . vitamin B-12 (CYANOCOBALAMIN) 100 MCG tablet Take 100 mcg by mouth daily.  . [DISCONTINUED] carvedilol (COREG) 25 MG tablet Take by mouth 2 (two) times daily with a meal.   . [DISCONTINUED] furosemide (LASIX) 20 MG tablet TAKE 2 TABLETS ($RemoveBe'40MG'DkKUcCMOw$ ) IN THE MORNING AND 1 TABLET ($RemoveB'20MG'OIRmtkMf$ ) BY MOUTH IN THE EVENING. (Patient taking differently: Take 40 mg by mouth daily as needed for fluid or edema. )  . [DISCONTINUED] metoprolol tartrate (LOPRESSOR) 25 MG tablet Take 25 mg  by mouth 2 (two) times daily.     Allergies:   Doxycycline   Social History   Tobacco Use  . Smoking status: Never Smoker  . Smokeless tobacco: Never Used  Vaping Use  . Vaping Use: Never used  Substance Use Topics  . Alcohol use: Yes    Alcohol/week: 0.0 standard drinks    Comment: glass wine occasionally  . Drug use: No     Family Hx: The patient's family history includes Heart failure in an other family member; Kidney failure in her mother. There is no history of Anesthesia problems, Hypotension, Malignant hyperthermia, or Pseudochol deficiency.  ROS:   Please see the history of present illness.     All other systems reviewed and are negative.   Prior CV studies:   The following studies were reviewed today:  08/07/13 Echo   Study Conclusions  - Procedure narrative: Transthoracic echocardiography. Image quality was suboptimal. The study was technically difficult, as a result of poor sound wave transmission and body habitus. - Left ventricle: The cavity size was normal. Wall thickness was increased in a pattern of moderate to severe LVH. Systolic function was mildly to moderately reduced. The estimated ejection fraction was in the range of 40% to 45%. Mild to moderate global hypokinesis was seen. There was an increased  relative contribution of atrial contraction to ventricular filling. Doppler parameters are consistent with abnormal left ventricular relaxation (grade 1 diastolic dysfunction). - Aortic valve: Mildly calcified annulus. Trileaflet. - Left atrium: The atrium was mildly dilated. - Right atrium: The atrium was mildly dilated.    07/2013 Event Monitor NSR, frequent PVCs     04/2017 echo Study Conclusions   - Left ventricle: The cavity size was normal. Wall thickness was   normal. Systolic function was normal. The estimated ejection   fraction was in the range of 50% to 55%. - Left atrium: The atrium was mildly dilated. - Pulmonary arteries: PA peak pressure: 33 mm Hg (S). - Impressions: Poor acoustic windows limit study Cannot evaluate   regional wall motion.   Impressions:   - Poor acoustic windows limit study Cannot evaluate regional wall   motion.     Labs/Other Tests and Data Reviewed:    EKG:  No ECG reviewed.  Recent Labs: No results found for requested labs within last 8760 hours.   Recent Lipid Panel Lab Results  Component Value Date/Time   CHOL 137 10/13/2015 10:36 AM   TRIG 83 10/13/2015 10:36 AM   HDL 65 10/13/2015 10:36 AM   CHOLHDL 2.1 10/13/2015 10:36 AM   LDLCALC 55 10/13/2015 10:36 AM    Wt Readings from Last 3 Encounters:  02/25/20 (!) 308 lb (139.7 kg)  01/18/19 (!) 315 lb (142.9 kg)  08/23/18 (!) 306 lb (138.8 kg)     Risk Assessment/Calculations:     CHA2DS2-VASc Score = 4  This indicates a 4.8% annual risk of stroke. The patient's score is based upon: CHF History: 1 HTN History: 1 Diabetes History: 0 Stroke History: 0 Vascular Disease History: 0 Age Score: 1 Gender Score: 1     Objective:    Vital Signs:  Pulse 86   Ht 5\' 4"  (1.626 m)   Wt (!) 308 lb (139.7 kg) Comment: not weighing since home  SpO2 99%   BMI 52.87 kg/m  unable to get accurate BP  VITAL SIGNS:  reviewed  ASSESSMENT & PLAN:    Nonischemic cardiomyopathy  LVEF previously normalized but was reduced at [facility] and  started on Entresto and now back on metoprolol-changed during recent hospitalization at [place]. Some leg swelling and lasix changed to demadex. Overall stable and labs last week stable. No changes. Will need to come into office for better assessment next OV.   PAF on Eliquis previously had bradycardia on metoprolol but has been on carvedilol per [facility] but after most recent hospitalization back on metoprolol and holds dose if HR low.  Hypertension BP checked by [facility] and has been ok. Patient unable to get accurate BP today  History of multiple myeloma status post bone marrow transplant in [place]  CKD Crt 1.43 02/20/20  OSA on CPAP   Shared Decision Making/Informed Consent        COVID-19 Education: The signs and symptoms of COVID-19 were discussed with the patient and how to seek care for testing (follow up with PCP or arrange E-visit).   The importance of social distancing was discussed today.  Time:   Today, I have spent 15:40 minutes with the patient with telehealth technology discussing the above problems.     Medication Adjustments/Labs and Tests Ordered: Current medicines are reviewed at length with the patient today.  Concerns regarding medicines are outlined above.   Tests Ordered: No orders of the defined types were placed in this encounter.   Medication Changes: Meds ordered this encounter  Medications  . torsemide (DEMADEX) 20 MG tablet    Sig: Take 1 tablet (20 mg total) by mouth 2 (two) times daily.    Dispense:  180 tablet    Refill:  3    Follow Up:  In Person in 3 month(s) Dr. Harl Bowie  Signed, Ermalinda Barrios, PA-C  02/25/2020 3:54 PM    [place]

## 2020-02-20 DIAGNOSIS — R5381 Other malaise: Secondary | ICD-10-CM | POA: Diagnosis not present

## 2020-02-20 DIAGNOSIS — M549 Dorsalgia, unspecified: Secondary | ICD-10-CM | POA: Diagnosis not present

## 2020-02-20 DIAGNOSIS — I1310 Hypertensive heart and chronic kidney disease without heart failure, with stage 1 through stage 4 chronic kidney disease, or unspecified chronic kidney disease: Secondary | ICD-10-CM | POA: Diagnosis not present

## 2020-02-20 DIAGNOSIS — C9000 Multiple myeloma not having achieved remission: Secondary | ICD-10-CM | POA: Diagnosis not present

## 2020-02-20 DIAGNOSIS — I509 Heart failure, unspecified: Secondary | ICD-10-CM | POA: Diagnosis not present

## 2020-02-22 DIAGNOSIS — I509 Heart failure, unspecified: Secondary | ICD-10-CM | POA: Diagnosis not present

## 2020-02-22 DIAGNOSIS — I4891 Unspecified atrial fibrillation: Secondary | ICD-10-CM | POA: Diagnosis not present

## 2020-02-22 DIAGNOSIS — E1122 Type 2 diabetes mellitus with diabetic chronic kidney disease: Secondary | ICD-10-CM | POA: Diagnosis not present

## 2020-02-22 DIAGNOSIS — D61818 Other pancytopenia: Secondary | ICD-10-CM | POA: Diagnosis not present

## 2020-02-22 DIAGNOSIS — N189 Chronic kidney disease, unspecified: Secondary | ICD-10-CM | POA: Diagnosis not present

## 2020-02-22 DIAGNOSIS — I130 Hypertensive heart and chronic kidney disease with heart failure and stage 1 through stage 4 chronic kidney disease, or unspecified chronic kidney disease: Secondary | ICD-10-CM | POA: Diagnosis not present

## 2020-02-22 DIAGNOSIS — L89151 Pressure ulcer of sacral region, stage 1: Secondary | ICD-10-CM | POA: Diagnosis not present

## 2020-02-22 DIAGNOSIS — G4733 Obstructive sleep apnea (adult) (pediatric): Secondary | ICD-10-CM | POA: Diagnosis not present

## 2020-02-22 DIAGNOSIS — C9002 Multiple myeloma in relapse: Secondary | ICD-10-CM | POA: Diagnosis not present

## 2020-02-22 DIAGNOSIS — D509 Iron deficiency anemia, unspecified: Secondary | ICD-10-CM | POA: Diagnosis not present

## 2020-02-22 DIAGNOSIS — N179 Acute kidney failure, unspecified: Secondary | ICD-10-CM | POA: Diagnosis not present

## 2020-02-22 DIAGNOSIS — K565 Intestinal adhesions [bands], unspecified as to partial versus complete obstruction: Secondary | ICD-10-CM | POA: Diagnosis not present

## 2020-02-25 ENCOUNTER — Other Ambulatory Visit: Payer: Self-pay

## 2020-02-25 ENCOUNTER — Telehealth (INDEPENDENT_AMBULATORY_CARE_PROVIDER_SITE_OTHER): Payer: Medicare Other | Admitting: Physician Assistant

## 2020-02-25 ENCOUNTER — Encounter: Payer: Self-pay | Admitting: Physician Assistant

## 2020-02-25 VITALS — HR 86 | Ht 64.0 in | Wt 308.0 lb

## 2020-02-25 DIAGNOSIS — N189 Chronic kidney disease, unspecified: Secondary | ICD-10-CM | POA: Diagnosis not present

## 2020-02-25 DIAGNOSIS — K565 Intestinal adhesions [bands], unspecified as to partial versus complete obstruction: Secondary | ICD-10-CM | POA: Diagnosis not present

## 2020-02-25 DIAGNOSIS — G4733 Obstructive sleep apnea (adult) (pediatric): Secondary | ICD-10-CM | POA: Diagnosis not present

## 2020-02-25 DIAGNOSIS — C9002 Multiple myeloma in relapse: Secondary | ICD-10-CM | POA: Diagnosis not present

## 2020-02-25 DIAGNOSIS — E1122 Type 2 diabetes mellitus with diabetic chronic kidney disease: Secondary | ICD-10-CM | POA: Diagnosis not present

## 2020-02-25 DIAGNOSIS — C9000 Multiple myeloma not having achieved remission: Secondary | ICD-10-CM | POA: Diagnosis not present

## 2020-02-25 DIAGNOSIS — I509 Heart failure, unspecified: Secondary | ICD-10-CM | POA: Diagnosis not present

## 2020-02-25 DIAGNOSIS — L89151 Pressure ulcer of sacral region, stage 1: Secondary | ICD-10-CM | POA: Diagnosis not present

## 2020-02-25 DIAGNOSIS — N179 Acute kidney failure, unspecified: Secondary | ICD-10-CM | POA: Diagnosis not present

## 2020-02-25 DIAGNOSIS — D61818 Other pancytopenia: Secondary | ICD-10-CM | POA: Diagnosis not present

## 2020-02-25 DIAGNOSIS — I428 Other cardiomyopathies: Secondary | ICD-10-CM

## 2020-02-25 DIAGNOSIS — I480 Paroxysmal atrial fibrillation: Secondary | ICD-10-CM | POA: Diagnosis not present

## 2020-02-25 DIAGNOSIS — I4891 Unspecified atrial fibrillation: Secondary | ICD-10-CM | POA: Diagnosis not present

## 2020-02-25 DIAGNOSIS — I10 Essential (primary) hypertension: Secondary | ICD-10-CM

## 2020-02-25 DIAGNOSIS — D509 Iron deficiency anemia, unspecified: Secondary | ICD-10-CM | POA: Diagnosis not present

## 2020-02-25 DIAGNOSIS — I130 Hypertensive heart and chronic kidney disease with heart failure and stage 1 through stage 4 chronic kidney disease, or unspecified chronic kidney disease: Secondary | ICD-10-CM | POA: Diagnosis not present

## 2020-02-25 MED ORDER — TORSEMIDE 20 MG PO TABS: 20.0000 mg | ORAL_TABLET | Freq: Two times a day (BID) | ORAL | 3 refills | Status: AC

## 2020-02-25 NOTE — Patient Instructions (Signed)
Medication Instructions:  Your physician recommends that you continue on your current medications as directed. Please refer to the Current Medication list given to you today.  *If you need a refill on your cardiac medications before your next appointment, please call your pharmacy*   Lab Work: None today If you have labs (blood work) drawn today and your tests are completely normal, you will receive your results only by: [name] MyChart Message (if you have MyChart) OR . A paper copy in the mail If you have any lab test that is abnormal or we need to change your treatment, we will call you to review the results.   Testing/Procedures: None today   Follow-Up: At [facility], you and your health needs are our priority.  As part of our continuing mission to provide you with exceptional heart care, we have created designated Provider Care Teams.  These Care Teams include your primary Cardiologist (physician) and Advanced Practice Providers (APPs -  Physician Assistants and Nurse Practitioners) who all work together to provide you with the care you need, when you need it.  We recommend signing up for the patient portal called "MyChart".  Sign up information is provided on this After Visit Summary.  MyChart is used to connect with patients for Virtual Visits (Telemedicine).  Patients are able to view lab/test results, encounter notes, upcoming appointments, etc.  Non-urgent messages can be sent to your provider as well.   To learn more about what you can do with MyChart, go to [email or web address].    Your next appointment:   3 month(s)  The format for your next appointment:   In Person  Provider:   You may see Carlyle Dolly, MD or one of the following Advanced Practice Providers on your designated Care Team:    Bernerd Pho, PA-C   Ermalinda Barrios, [place]     Other Instructions None

## 2020-02-26 DIAGNOSIS — L89151 Pressure ulcer of sacral region, stage 1: Secondary | ICD-10-CM | POA: Diagnosis not present

## 2020-02-26 DIAGNOSIS — I4891 Unspecified atrial fibrillation: Secondary | ICD-10-CM | POA: Diagnosis not present

## 2020-02-26 DIAGNOSIS — I509 Heart failure, unspecified: Secondary | ICD-10-CM | POA: Diagnosis not present

## 2020-02-26 DIAGNOSIS — D61818 Other pancytopenia: Secondary | ICD-10-CM | POA: Diagnosis not present

## 2020-02-26 DIAGNOSIS — C9002 Multiple myeloma in relapse: Secondary | ICD-10-CM | POA: Diagnosis not present

## 2020-02-26 DIAGNOSIS — N179 Acute kidney failure, unspecified: Secondary | ICD-10-CM | POA: Diagnosis not present

## 2020-02-26 DIAGNOSIS — N189 Chronic kidney disease, unspecified: Secondary | ICD-10-CM | POA: Diagnosis not present

## 2020-02-26 DIAGNOSIS — D509 Iron deficiency anemia, unspecified: Secondary | ICD-10-CM | POA: Diagnosis not present

## 2020-02-26 DIAGNOSIS — E1122 Type 2 diabetes mellitus with diabetic chronic kidney disease: Secondary | ICD-10-CM | POA: Diagnosis not present

## 2020-02-26 DIAGNOSIS — I130 Hypertensive heart and chronic kidney disease with heart failure and stage 1 through stage 4 chronic kidney disease, or unspecified chronic kidney disease: Secondary | ICD-10-CM | POA: Diagnosis not present

## 2020-02-26 DIAGNOSIS — G4733 Obstructive sleep apnea (adult) (pediatric): Secondary | ICD-10-CM | POA: Diagnosis not present

## 2020-02-26 DIAGNOSIS — K565 Intestinal adhesions [bands], unspecified as to partial versus complete obstruction: Secondary | ICD-10-CM | POA: Diagnosis not present

## 2020-02-27 DIAGNOSIS — K565 Intestinal adhesions [bands], unspecified as to partial versus complete obstruction: Secondary | ICD-10-CM | POA: Diagnosis not present

## 2020-02-27 DIAGNOSIS — I509 Heart failure, unspecified: Secondary | ICD-10-CM | POA: Diagnosis not present

## 2020-02-27 DIAGNOSIS — G4733 Obstructive sleep apnea (adult) (pediatric): Secondary | ICD-10-CM | POA: Diagnosis not present

## 2020-02-27 DIAGNOSIS — D61818 Other pancytopenia: Secondary | ICD-10-CM | POA: Diagnosis not present

## 2020-02-27 DIAGNOSIS — N179 Acute kidney failure, unspecified: Secondary | ICD-10-CM | POA: Diagnosis not present

## 2020-02-27 DIAGNOSIS — N189 Chronic kidney disease, unspecified: Secondary | ICD-10-CM | POA: Diagnosis not present

## 2020-02-27 DIAGNOSIS — L89151 Pressure ulcer of sacral region, stage 1: Secondary | ICD-10-CM | POA: Diagnosis not present

## 2020-02-27 DIAGNOSIS — I4891 Unspecified atrial fibrillation: Secondary | ICD-10-CM | POA: Diagnosis not present

## 2020-02-27 DIAGNOSIS — C9002 Multiple myeloma in relapse: Secondary | ICD-10-CM | POA: Diagnosis not present

## 2020-02-27 DIAGNOSIS — D509 Iron deficiency anemia, unspecified: Secondary | ICD-10-CM | POA: Diagnosis not present

## 2020-02-27 DIAGNOSIS — E1122 Type 2 diabetes mellitus with diabetic chronic kidney disease: Secondary | ICD-10-CM | POA: Diagnosis not present

## 2020-02-27 DIAGNOSIS — I130 Hypertensive heart and chronic kidney disease with heart failure and stage 1 through stage 4 chronic kidney disease, or unspecified chronic kidney disease: Secondary | ICD-10-CM | POA: Diagnosis not present

## 2020-02-28 DIAGNOSIS — N189 Chronic kidney disease, unspecified: Secondary | ICD-10-CM | POA: Diagnosis not present

## 2020-02-28 DIAGNOSIS — E1122 Type 2 diabetes mellitus with diabetic chronic kidney disease: Secondary | ICD-10-CM | POA: Diagnosis not present

## 2020-02-28 DIAGNOSIS — D61818 Other pancytopenia: Secondary | ICD-10-CM | POA: Diagnosis not present

## 2020-02-28 DIAGNOSIS — I509 Heart failure, unspecified: Secondary | ICD-10-CM | POA: Diagnosis not present

## 2020-02-28 DIAGNOSIS — G4733 Obstructive sleep apnea (adult) (pediatric): Secondary | ICD-10-CM | POA: Diagnosis not present

## 2020-02-28 DIAGNOSIS — K565 Intestinal adhesions [bands], unspecified as to partial versus complete obstruction: Secondary | ICD-10-CM | POA: Diagnosis not present

## 2020-02-28 DIAGNOSIS — C9002 Multiple myeloma in relapse: Secondary | ICD-10-CM | POA: Diagnosis not present

## 2020-02-28 DIAGNOSIS — I130 Hypertensive heart and chronic kidney disease with heart failure and stage 1 through stage 4 chronic kidney disease, or unspecified chronic kidney disease: Secondary | ICD-10-CM | POA: Diagnosis not present

## 2020-02-28 DIAGNOSIS — D509 Iron deficiency anemia, unspecified: Secondary | ICD-10-CM | POA: Diagnosis not present

## 2020-02-28 DIAGNOSIS — L89151 Pressure ulcer of sacral region, stage 1: Secondary | ICD-10-CM | POA: Diagnosis not present

## 2020-02-28 DIAGNOSIS — I4891 Unspecified atrial fibrillation: Secondary | ICD-10-CM | POA: Diagnosis not present

## 2020-02-28 DIAGNOSIS — N179 Acute kidney failure, unspecified: Secondary | ICD-10-CM | POA: Diagnosis not present

## 2020-02-29 DIAGNOSIS — G4733 Obstructive sleep apnea (adult) (pediatric): Secondary | ICD-10-CM | POA: Diagnosis not present

## 2020-02-29 DIAGNOSIS — I517 Cardiomegaly: Secondary | ICD-10-CM | POA: Diagnosis not present

## 2020-02-29 DIAGNOSIS — D61818 Other pancytopenia: Secondary | ICD-10-CM | POA: Diagnosis not present

## 2020-02-29 DIAGNOSIS — I959 Hypotension, unspecified: Secondary | ICD-10-CM | POA: Diagnosis not present

## 2020-02-29 DIAGNOSIS — I4891 Unspecified atrial fibrillation: Secondary | ICD-10-CM | POA: Diagnosis not present

## 2020-02-29 DIAGNOSIS — E871 Hypo-osmolality and hyponatremia: Secondary | ICD-10-CM | POA: Diagnosis not present

## 2020-02-29 DIAGNOSIS — Z881 Allergy status to other antibiotic agents status: Secondary | ICD-10-CM | POA: Diagnosis not present

## 2020-02-29 DIAGNOSIS — J9601 Acute respiratory failure with hypoxia: Secondary | ICD-10-CM | POA: Diagnosis not present

## 2020-02-29 DIAGNOSIS — I130 Hypertensive heart and chronic kidney disease with heart failure and stage 1 through stage 4 chronic kidney disease, or unspecified chronic kidney disease: Secondary | ICD-10-CM | POA: Diagnosis not present

## 2020-02-29 DIAGNOSIS — N179 Acute kidney failure, unspecified: Secondary | ICD-10-CM | POA: Diagnosis not present

## 2020-02-29 DIAGNOSIS — Z9049 Acquired absence of other specified parts of digestive tract: Secondary | ICD-10-CM | POA: Diagnosis not present

## 2020-02-29 DIAGNOSIS — D631 Anemia in chronic kidney disease: Secondary | ICD-10-CM | POA: Diagnosis not present

## 2020-02-29 DIAGNOSIS — E1165 Type 2 diabetes mellitus with hyperglycemia: Secondary | ICD-10-CM | POA: Diagnosis not present

## 2020-02-29 DIAGNOSIS — D72819 Decreased white blood cell count, unspecified: Secondary | ICD-10-CM | POA: Diagnosis not present

## 2020-02-29 DIAGNOSIS — S7011XA Contusion of right thigh, initial encounter: Secondary | ICD-10-CM | POA: Diagnosis not present

## 2020-02-29 DIAGNOSIS — J9602 Acute respiratory failure with hypercapnia: Secondary | ICD-10-CM | POA: Diagnosis not present

## 2020-02-29 DIAGNOSIS — N189 Chronic kidney disease, unspecified: Secondary | ICD-10-CM | POA: Diagnosis not present

## 2020-02-29 DIAGNOSIS — R0989 Other specified symptoms and signs involving the circulatory and respiratory systems: Secondary | ICD-10-CM | POA: Diagnosis not present

## 2020-02-29 DIAGNOSIS — E1122 Type 2 diabetes mellitus with diabetic chronic kidney disease: Secondary | ICD-10-CM | POA: Diagnosis not present

## 2020-02-29 DIAGNOSIS — R0602 Shortness of breath: Secondary | ICD-10-CM | POA: Diagnosis not present

## 2020-02-29 DIAGNOSIS — D696 Thrombocytopenia, unspecified: Secondary | ICD-10-CM | POA: Diagnosis not present

## 2020-02-29 DIAGNOSIS — D701 Agranulocytosis secondary to cancer chemotherapy: Secondary | ICD-10-CM | POA: Diagnosis not present

## 2020-02-29 DIAGNOSIS — E875 Hyperkalemia: Secondary | ICD-10-CM | POA: Diagnosis not present

## 2020-02-29 DIAGNOSIS — Z888 Allergy status to other drugs, medicaments and biological substances status: Secondary | ICD-10-CM | POA: Diagnosis not present

## 2020-02-29 DIAGNOSIS — I5033 Acute on chronic diastolic (congestive) heart failure: Secondary | ICD-10-CM | POA: Diagnosis not present

## 2020-02-29 DIAGNOSIS — D649 Anemia, unspecified: Secondary | ICD-10-CM | POA: Diagnosis not present

## 2020-02-29 DIAGNOSIS — C9000 Multiple myeloma not having achieved remission: Secondary | ICD-10-CM | POA: Diagnosis not present

## 2020-03-03 DIAGNOSIS — S7011XA Contusion of right thigh, initial encounter: Secondary | ICD-10-CM | POA: Diagnosis not present

## 2020-03-03 DIAGNOSIS — Z7901 Long term (current) use of anticoagulants: Secondary | ICD-10-CM | POA: Diagnosis not present

## 2020-03-03 DIAGNOSIS — D696 Thrombocytopenia, unspecified: Secondary | ICD-10-CM | POA: Diagnosis not present

## 2020-03-03 DIAGNOSIS — C9000 Multiple myeloma not having achieved remission: Secondary | ICD-10-CM | POA: Diagnosis not present

## 2020-03-03 DIAGNOSIS — J90 Pleural effusion, not elsewhere classified: Secondary | ICD-10-CM | POA: Diagnosis not present

## 2020-03-03 DIAGNOSIS — Z66 Do not resuscitate: Secondary | ICD-10-CM | POA: Diagnosis not present

## 2020-03-03 DIAGNOSIS — D849 Immunodeficiency, unspecified: Secondary | ICD-10-CM | POA: Diagnosis not present

## 2020-03-03 DIAGNOSIS — Z4682 Encounter for fitting and adjustment of non-vascular catheter: Secondary | ICD-10-CM | POA: Diagnosis not present

## 2020-03-03 DIAGNOSIS — N183 Chronic kidney disease, stage 3 unspecified: Secondary | ICD-10-CM | POA: Diagnosis not present

## 2020-03-03 DIAGNOSIS — Z9484 Stem cells transplant status: Secondary | ICD-10-CM | POA: Diagnosis not present

## 2020-03-03 DIAGNOSIS — C9002 Multiple myeloma in relapse: Secondary | ICD-10-CM | POA: Diagnosis not present

## 2020-03-03 DIAGNOSIS — N184 Chronic kidney disease, stage 4 (severe): Secondary | ICD-10-CM | POA: Diagnosis not present

## 2020-03-03 DIAGNOSIS — D61818 Other pancytopenia: Secondary | ICD-10-CM | POA: Diagnosis not present

## 2020-03-03 DIAGNOSIS — I517 Cardiomegaly: Secondary | ICD-10-CM | POA: Diagnosis not present

## 2020-03-03 DIAGNOSIS — I5189 Other ill-defined heart diseases: Secondary | ICD-10-CM | POA: Diagnosis not present

## 2020-03-03 DIAGNOSIS — R9431 Abnormal electrocardiogram [ECG] [EKG]: Secondary | ICD-10-CM | POA: Diagnosis not present

## 2020-03-03 DIAGNOSIS — I4892 Unspecified atrial flutter: Secondary | ICD-10-CM | POA: Diagnosis not present

## 2020-03-03 DIAGNOSIS — B9689 Other specified bacterial agents as the cause of diseases classified elsewhere: Secondary | ICD-10-CM | POA: Diagnosis not present

## 2020-03-03 DIAGNOSIS — J9600 Acute respiratory failure, unspecified whether with hypoxia or hypercapnia: Secondary | ICD-10-CM | POA: Diagnosis not present

## 2020-03-03 DIAGNOSIS — I445 Left posterior fascicular block: Secondary | ICD-10-CM | POA: Diagnosis not present

## 2020-03-03 DIAGNOSIS — Z515 Encounter for palliative care: Secondary | ICD-10-CM | POA: Diagnosis not present

## 2020-03-03 DIAGNOSIS — E872 Acidosis: Secondary | ICD-10-CM | POA: Diagnosis not present

## 2020-03-03 DIAGNOSIS — I4819 Other persistent atrial fibrillation: Secondary | ICD-10-CM | POA: Diagnosis not present

## 2020-03-03 DIAGNOSIS — L03116 Cellulitis of left lower limb: Secondary | ICD-10-CM | POA: Diagnosis not present

## 2020-03-03 DIAGNOSIS — J9601 Acute respiratory failure with hypoxia: Secondary | ICD-10-CM | POA: Diagnosis not present

## 2020-03-03 DIAGNOSIS — I5082 Biventricular heart failure: Secondary | ICD-10-CM | POA: Diagnosis not present

## 2020-03-03 DIAGNOSIS — I130 Hypertensive heart and chronic kidney disease with heart failure and stage 1 through stage 4 chronic kidney disease, or unspecified chronic kidney disease: Secondary | ICD-10-CM | POA: Diagnosis not present

## 2020-03-03 DIAGNOSIS — I469 Cardiac arrest, cause unspecified: Secondary | ICD-10-CM | POA: Diagnosis not present

## 2020-03-03 DIAGNOSIS — Z452 Encounter for adjustment and management of vascular access device: Secondary | ICD-10-CM | POA: Diagnosis not present

## 2020-03-03 DIAGNOSIS — A419 Sepsis, unspecified organism: Secondary | ICD-10-CM | POA: Diagnosis not present

## 2020-03-03 DIAGNOSIS — R000 Tachycardia, unspecified: Secondary | ICD-10-CM | POA: Diagnosis not present

## 2020-03-03 DIAGNOSIS — I4891 Unspecified atrial fibrillation: Secondary | ICD-10-CM | POA: Diagnosis not present

## 2020-03-03 DIAGNOSIS — E1122 Type 2 diabetes mellitus with diabetic chronic kidney disease: Secondary | ICD-10-CM | POA: Diagnosis not present

## 2020-03-03 DIAGNOSIS — I471 Supraventricular tachycardia: Secondary | ICD-10-CM | POA: Diagnosis not present

## 2020-03-03 DIAGNOSIS — L03115 Cellulitis of right lower limb: Secondary | ICD-10-CM | POA: Diagnosis not present

## 2020-03-03 DIAGNOSIS — K56609 Unspecified intestinal obstruction, unspecified as to partial versus complete obstruction: Secondary | ICD-10-CM | POA: Diagnosis not present

## 2020-03-03 DIAGNOSIS — I5023 Acute on chronic systolic (congestive) heart failure: Secondary | ICD-10-CM | POA: Diagnosis not present

## 2020-03-03 DIAGNOSIS — R6521 Severe sepsis with septic shock: Secondary | ICD-10-CM | POA: Diagnosis not present

## 2020-03-03 DIAGNOSIS — R109 Unspecified abdominal pain: Secondary | ICD-10-CM | POA: Diagnosis not present

## 2020-03-03 DIAGNOSIS — E875 Hyperkalemia: Secondary | ICD-10-CM | POA: Diagnosis not present

## 2020-03-03 DIAGNOSIS — R579 Shock, unspecified: Secondary | ICD-10-CM | POA: Diagnosis not present

## 2020-03-03 DIAGNOSIS — I088 Other rheumatic multiple valve diseases: Secondary | ICD-10-CM | POA: Diagnosis not present

## 2020-03-03 DIAGNOSIS — N179 Acute kidney failure, unspecified: Secondary | ICD-10-CM | POA: Diagnosis not present

## 2020-03-22 DEATH — deceased

## 2020-05-26 ENCOUNTER — Ambulatory Visit: Payer: Medicare Other | Admitting: Physician Assistant
